# Patient Record
Sex: Male | Born: 1970
Health system: Southern US, Community
[De-identification: ages and names within clinical notes are randomized; demographics above are authoritative.]

## PROBLEM LIST (undated history)

## (undated) DIAGNOSIS — I509 Heart failure, unspecified: Secondary | ICD-10-CM

## (undated) DIAGNOSIS — E119 Type 2 diabetes mellitus without complications: Secondary | ICD-10-CM

## (undated) DIAGNOSIS — I201 Angina pectoris with documented spasm: Secondary | ICD-10-CM

## (undated) DIAGNOSIS — I2510 Atherosclerotic heart disease of native coronary artery without angina pectoris: Secondary | ICD-10-CM

## (undated) DIAGNOSIS — G4739 Other sleep apnea: Secondary | ICD-10-CM

## (undated) DIAGNOSIS — E785 Hyperlipidemia, unspecified: Secondary | ICD-10-CM

## (undated) DIAGNOSIS — L84 Corns and callosities: Secondary | ICD-10-CM

## (undated) DIAGNOSIS — I10 Essential (primary) hypertension: Secondary | ICD-10-CM

## (undated) DIAGNOSIS — K859 Acute pancreatitis without necrosis or infection, unspecified: Secondary | ICD-10-CM

## (undated) HISTORY — DX: Atherosclerotic heart disease of native coronary artery without angina pectoris: I25.10

## (undated) HISTORY — DX: Acute pancreatitis without necrosis or infection, unspecified: K85.90

## (undated) HISTORY — DX: Type 2 diabetes mellitus without complications: E11.9

## (undated) HISTORY — DX: Angina pectoris with documented spasm: I20.1

## (undated) HISTORY — DX: Hyperlipidemia, unspecified: E78.5

## (undated) HISTORY — DX: Heart failure, unspecified: I50.9

## (undated) HISTORY — DX: Corns and callosities: L84

## (undated) HISTORY — DX: Other sleep apnea: G47.39

## (undated) HISTORY — DX: Essential (primary) hypertension: I10

---

## 2003-06-24 ENCOUNTER — Ambulatory Visit: Admission: RE | Admit: 2003-06-24 | Discharge: 2003-06-24 | Payer: Self-pay | Admitting: Unknown Physician Specialty

## 2014-01-17 HISTORY — PX: APPENDECTOMY: SHX54

## 2014-01-17 HISTORY — PX: OTHER SURGICAL HISTORY: SHX169

## 2014-07-14 DIAGNOSIS — G8929 Other chronic pain: Secondary | ICD-10-CM | POA: Diagnosis not present

## 2014-07-14 DIAGNOSIS — G4733 Obstructive sleep apnea (adult) (pediatric): Secondary | ICD-10-CM | POA: Diagnosis not present

## 2014-07-14 DIAGNOSIS — I10 Essential (primary) hypertension: Secondary | ICD-10-CM | POA: Diagnosis not present

## 2014-07-14 DIAGNOSIS — K37 Unspecified appendicitis: Secondary | ICD-10-CM | POA: Diagnosis not present

## 2014-07-14 DIAGNOSIS — F17200 Nicotine dependence, unspecified, uncomplicated: Secondary | ICD-10-CM | POA: Diagnosis not present

## 2014-07-14 DIAGNOSIS — M549 Dorsalgia, unspecified: Secondary | ICD-10-CM | POA: Diagnosis not present

## 2014-07-14 DIAGNOSIS — K3580 Unspecified acute appendicitis: Secondary | ICD-10-CM | POA: Diagnosis not present

## 2014-07-14 DIAGNOSIS — Z79899 Other long term (current) drug therapy: Secondary | ICD-10-CM | POA: Diagnosis not present

## 2014-07-14 DIAGNOSIS — M1990 Unspecified osteoarthritis, unspecified site: Secondary | ICD-10-CM | POA: Diagnosis not present

## 2014-07-14 DIAGNOSIS — R1030 Lower abdominal pain, unspecified: Secondary | ICD-10-CM | POA: Diagnosis not present

## 2014-07-14 DIAGNOSIS — E119 Type 2 diabetes mellitus without complications: Secondary | ICD-10-CM | POA: Diagnosis not present

## 2014-07-14 DIAGNOSIS — K3589 Other acute appendicitis: Secondary | ICD-10-CM | POA: Diagnosis not present

## 2014-07-15 DIAGNOSIS — K37 Unspecified appendicitis: Secondary | ICD-10-CM | POA: Diagnosis not present

## 2014-07-16 DIAGNOSIS — K37 Unspecified appendicitis: Secondary | ICD-10-CM | POA: Diagnosis not present

## 2014-08-09 DIAGNOSIS — K859 Acute pancreatitis, unspecified: Secondary | ICD-10-CM | POA: Diagnosis not present

## 2014-08-09 DIAGNOSIS — K920 Hematemesis: Secondary | ICD-10-CM | POA: Diagnosis not present

## 2014-08-09 DIAGNOSIS — K449 Diaphragmatic hernia without obstruction or gangrene: Secondary | ICD-10-CM | POA: Diagnosis not present

## 2014-08-09 DIAGNOSIS — K219 Gastro-esophageal reflux disease without esophagitis: Secondary | ICD-10-CM | POA: Diagnosis not present

## 2014-08-09 DIAGNOSIS — E119 Type 2 diabetes mellitus without complications: Secondary | ICD-10-CM | POA: Diagnosis not present

## 2014-08-09 DIAGNOSIS — R0602 Shortness of breath: Secondary | ICD-10-CM | POA: Diagnosis not present

## 2014-08-09 DIAGNOSIS — Z6834 Body mass index (BMI) 34.0-34.9, adult: Secondary | ICD-10-CM | POA: Diagnosis not present

## 2014-08-09 DIAGNOSIS — E781 Pure hyperglyceridemia: Secondary | ICD-10-CM | POA: Diagnosis not present

## 2014-08-09 DIAGNOSIS — M545 Low back pain: Secondary | ICD-10-CM | POA: Diagnosis not present

## 2014-08-09 DIAGNOSIS — I10 Essential (primary) hypertension: Secondary | ICD-10-CM | POA: Diagnosis not present

## 2014-08-09 DIAGNOSIS — R1110 Vomiting, unspecified: Secondary | ICD-10-CM | POA: Diagnosis not present

## 2014-08-10 DIAGNOSIS — K859 Acute pancreatitis, unspecified: Secondary | ICD-10-CM | POA: Diagnosis not present

## 2014-08-11 DIAGNOSIS — K859 Acute pancreatitis, unspecified: Secondary | ICD-10-CM | POA: Diagnosis not present

## 2014-09-15 DIAGNOSIS — I10 Essential (primary) hypertension: Secondary | ICD-10-CM | POA: Diagnosis not present

## 2014-09-15 DIAGNOSIS — E1140 Type 2 diabetes mellitus with diabetic neuropathy, unspecified: Secondary | ICD-10-CM | POA: Diagnosis not present

## 2014-09-15 DIAGNOSIS — G4730 Sleep apnea, unspecified: Secondary | ICD-10-CM | POA: Diagnosis not present

## 2014-09-30 DIAGNOSIS — G4730 Sleep apnea, unspecified: Secondary | ICD-10-CM | POA: Diagnosis not present

## 2014-10-02 DIAGNOSIS — G4730 Sleep apnea, unspecified: Secondary | ICD-10-CM | POA: Diagnosis not present

## 2014-10-03 DIAGNOSIS — R109 Unspecified abdominal pain: Secondary | ICD-10-CM | POA: Diagnosis not present

## 2014-12-29 DIAGNOSIS — E1143 Type 2 diabetes mellitus with diabetic autonomic (poly)neuropathy: Secondary | ICD-10-CM | POA: Diagnosis not present

## 2014-12-29 DIAGNOSIS — I10 Essential (primary) hypertension: Secondary | ICD-10-CM | POA: Diagnosis not present

## 2015-01-29 DIAGNOSIS — E119 Type 2 diabetes mellitus without complications: Secondary | ICD-10-CM | POA: Diagnosis not present

## 2015-04-02 DIAGNOSIS — I10 Essential (primary) hypertension: Secondary | ICD-10-CM | POA: Diagnosis not present

## 2015-04-02 DIAGNOSIS — E1143 Type 2 diabetes mellitus with diabetic autonomic (poly)neuropathy: Secondary | ICD-10-CM | POA: Diagnosis not present

## 2015-06-23 DIAGNOSIS — K0889 Other specified disorders of teeth and supporting structures: Secondary | ICD-10-CM | POA: Diagnosis not present

## 2015-06-23 DIAGNOSIS — E119 Type 2 diabetes mellitus without complications: Secondary | ICD-10-CM | POA: Diagnosis not present

## 2015-06-23 DIAGNOSIS — Z79899 Other long term (current) drug therapy: Secondary | ICD-10-CM | POA: Diagnosis not present

## 2015-06-23 DIAGNOSIS — Z7984 Long term (current) use of oral hypoglycemic drugs: Secondary | ICD-10-CM | POA: Diagnosis not present

## 2015-06-23 DIAGNOSIS — K047 Periapical abscess without sinus: Secondary | ICD-10-CM | POA: Diagnosis not present

## 2015-06-23 DIAGNOSIS — F17200 Nicotine dependence, unspecified, uncomplicated: Secondary | ICD-10-CM | POA: Diagnosis not present

## 2015-06-23 DIAGNOSIS — I10 Essential (primary) hypertension: Secondary | ICD-10-CM | POA: Diagnosis not present

## 2015-07-03 DIAGNOSIS — E1143 Type 2 diabetes mellitus with diabetic autonomic (poly)neuropathy: Secondary | ICD-10-CM | POA: Diagnosis not present

## 2015-07-03 DIAGNOSIS — I10 Essential (primary) hypertension: Secondary | ICD-10-CM | POA: Diagnosis not present

## 2015-07-03 DIAGNOSIS — G4739 Other sleep apnea: Secondary | ICD-10-CM | POA: Diagnosis not present

## 2015-07-22 DIAGNOSIS — G4739 Other sleep apnea: Secondary | ICD-10-CM | POA: Diagnosis not present

## 2015-07-22 DIAGNOSIS — E1143 Type 2 diabetes mellitus with diabetic autonomic (poly)neuropathy: Secondary | ICD-10-CM | POA: Diagnosis not present

## 2015-07-22 DIAGNOSIS — E784 Other hyperlipidemia: Secondary | ICD-10-CM | POA: Diagnosis not present

## 2015-07-22 DIAGNOSIS — I10 Essential (primary) hypertension: Secondary | ICD-10-CM | POA: Diagnosis not present

## 2015-10-02 DIAGNOSIS — G4739 Other sleep apnea: Secondary | ICD-10-CM | POA: Diagnosis not present

## 2015-10-02 DIAGNOSIS — I10 Essential (primary) hypertension: Secondary | ICD-10-CM | POA: Diagnosis not present

## 2015-10-02 DIAGNOSIS — E1143 Type 2 diabetes mellitus with diabetic autonomic (poly)neuropathy: Secondary | ICD-10-CM | POA: Diagnosis not present

## 2015-10-22 DIAGNOSIS — G4739 Other sleep apnea: Secondary | ICD-10-CM | POA: Diagnosis not present

## 2015-10-22 DIAGNOSIS — I10 Essential (primary) hypertension: Secondary | ICD-10-CM | POA: Diagnosis not present

## 2015-10-22 DIAGNOSIS — E1143 Type 2 diabetes mellitus with diabetic autonomic (poly)neuropathy: Secondary | ICD-10-CM | POA: Diagnosis not present

## 2015-10-22 DIAGNOSIS — E784 Other hyperlipidemia: Secondary | ICD-10-CM | POA: Diagnosis not present

## 2015-11-18 DIAGNOSIS — I10 Essential (primary) hypertension: Secondary | ICD-10-CM | POA: Diagnosis not present

## 2015-11-18 DIAGNOSIS — G4739 Other sleep apnea: Secondary | ICD-10-CM | POA: Diagnosis not present

## 2015-11-18 DIAGNOSIS — E1143 Type 2 diabetes mellitus with diabetic autonomic (poly)neuropathy: Secondary | ICD-10-CM | POA: Diagnosis not present

## 2015-11-18 DIAGNOSIS — E784 Other hyperlipidemia: Secondary | ICD-10-CM | POA: Diagnosis not present

## 2015-12-21 DIAGNOSIS — G4739 Other sleep apnea: Secondary | ICD-10-CM | POA: Diagnosis not present

## 2015-12-21 DIAGNOSIS — E1143 Type 2 diabetes mellitus with diabetic autonomic (poly)neuropathy: Secondary | ICD-10-CM | POA: Diagnosis not present

## 2015-12-21 DIAGNOSIS — E784 Other hyperlipidemia: Secondary | ICD-10-CM | POA: Diagnosis not present

## 2015-12-21 DIAGNOSIS — I10 Essential (primary) hypertension: Secondary | ICD-10-CM | POA: Diagnosis not present

## 2016-01-01 DIAGNOSIS — E1143 Type 2 diabetes mellitus with diabetic autonomic (poly)neuropathy: Secondary | ICD-10-CM | POA: Diagnosis not present

## 2016-01-01 DIAGNOSIS — Z0000 Encounter for general adult medical examination without abnormal findings: Secondary | ICD-10-CM | POA: Diagnosis not present

## 2016-01-01 DIAGNOSIS — L303 Infective dermatitis: Secondary | ICD-10-CM | POA: Diagnosis not present

## 2016-01-01 DIAGNOSIS — Z23 Encounter for immunization: Secondary | ICD-10-CM | POA: Diagnosis not present

## 2016-01-01 DIAGNOSIS — I10 Essential (primary) hypertension: Secondary | ICD-10-CM | POA: Diagnosis not present

## 2016-01-16 DIAGNOSIS — G8929 Other chronic pain: Secondary | ICD-10-CM | POA: Diagnosis not present

## 2016-01-16 DIAGNOSIS — M545 Low back pain: Secondary | ICD-10-CM | POA: Diagnosis not present

## 2016-01-16 DIAGNOSIS — Z79899 Other long term (current) drug therapy: Secondary | ICD-10-CM | POA: Diagnosis not present

## 2016-01-16 DIAGNOSIS — Z7984 Long term (current) use of oral hypoglycemic drugs: Secondary | ICD-10-CM | POA: Diagnosis not present

## 2016-01-16 DIAGNOSIS — F17200 Nicotine dependence, unspecified, uncomplicated: Secondary | ICD-10-CM | POA: Diagnosis not present

## 2016-01-16 DIAGNOSIS — I10 Essential (primary) hypertension: Secondary | ICD-10-CM | POA: Diagnosis not present

## 2016-01-16 DIAGNOSIS — E119 Type 2 diabetes mellitus without complications: Secondary | ICD-10-CM | POA: Diagnosis not present

## 2016-01-16 DIAGNOSIS — Z87442 Personal history of urinary calculi: Secondary | ICD-10-CM | POA: Diagnosis not present

## 2016-01-16 DIAGNOSIS — N390 Urinary tract infection, site not specified: Secondary | ICD-10-CM | POA: Diagnosis not present

## 2016-01-20 DIAGNOSIS — E1143 Type 2 diabetes mellitus with diabetic autonomic (poly)neuropathy: Secondary | ICD-10-CM | POA: Diagnosis not present

## 2016-01-20 DIAGNOSIS — G4739 Other sleep apnea: Secondary | ICD-10-CM | POA: Diagnosis not present

## 2016-01-20 DIAGNOSIS — E784 Other hyperlipidemia: Secondary | ICD-10-CM | POA: Diagnosis not present

## 2016-01-20 DIAGNOSIS — I10 Essential (primary) hypertension: Secondary | ICD-10-CM | POA: Diagnosis not present

## 2016-01-29 DIAGNOSIS — N200 Calculus of kidney: Secondary | ICD-10-CM | POA: Diagnosis not present

## 2016-01-30 DIAGNOSIS — Z87442 Personal history of urinary calculi: Secondary | ICD-10-CM | POA: Diagnosis not present

## 2016-01-30 DIAGNOSIS — R319 Hematuria, unspecified: Secondary | ICD-10-CM | POA: Diagnosis not present

## 2016-01-30 DIAGNOSIS — R1012 Left upper quadrant pain: Secondary | ICD-10-CM | POA: Diagnosis not present

## 2016-01-30 DIAGNOSIS — R1032 Left lower quadrant pain: Secondary | ICD-10-CM | POA: Diagnosis not present

## 2017-02-21 DIAGNOSIS — E1143 Type 2 diabetes mellitus with diabetic autonomic (poly)neuropathy: Secondary | ICD-10-CM | POA: Diagnosis not present

## 2017-02-21 DIAGNOSIS — G4739 Other sleep apnea: Secondary | ICD-10-CM | POA: Diagnosis not present

## 2017-02-21 DIAGNOSIS — I10 Essential (primary) hypertension: Secondary | ICD-10-CM | POA: Diagnosis not present

## 2017-02-21 DIAGNOSIS — E7849 Other hyperlipidemia: Secondary | ICD-10-CM | POA: Diagnosis not present

## 2017-02-24 DIAGNOSIS — E7849 Other hyperlipidemia: Secondary | ICD-10-CM | POA: Diagnosis not present

## 2017-02-24 DIAGNOSIS — E1143 Type 2 diabetes mellitus with diabetic autonomic (poly)neuropathy: Secondary | ICD-10-CM | POA: Diagnosis not present

## 2017-02-24 DIAGNOSIS — G4739 Other sleep apnea: Secondary | ICD-10-CM | POA: Diagnosis not present

## 2017-02-24 DIAGNOSIS — I10 Essential (primary) hypertension: Secondary | ICD-10-CM | POA: Diagnosis not present

## 2017-05-25 DIAGNOSIS — L84 Corns and callosities: Secondary | ICD-10-CM | POA: Diagnosis not present

## 2017-05-25 DIAGNOSIS — G4739 Other sleep apnea: Secondary | ICD-10-CM | POA: Diagnosis not present

## 2017-05-25 DIAGNOSIS — I201 Angina pectoris with documented spasm: Secondary | ICD-10-CM | POA: Diagnosis not present

## 2017-05-25 DIAGNOSIS — E7849 Other hyperlipidemia: Secondary | ICD-10-CM | POA: Diagnosis not present

## 2017-05-25 DIAGNOSIS — I10 Essential (primary) hypertension: Secondary | ICD-10-CM | POA: Diagnosis not present

## 2017-05-25 DIAGNOSIS — E1143 Type 2 diabetes mellitus with diabetic autonomic (poly)neuropathy: Secondary | ICD-10-CM | POA: Diagnosis not present

## 2017-05-30 DIAGNOSIS — R931 Abnormal findings on diagnostic imaging of heart and coronary circulation: Secondary | ICD-10-CM | POA: Diagnosis not present

## 2017-05-30 DIAGNOSIS — I209 Angina pectoris, unspecified: Secondary | ICD-10-CM | POA: Diagnosis not present

## 2017-05-30 DIAGNOSIS — I201 Angina pectoris with documented spasm: Secondary | ICD-10-CM | POA: Diagnosis not present

## 2017-05-30 DIAGNOSIS — I517 Cardiomegaly: Secondary | ICD-10-CM | POA: Diagnosis not present

## 2017-06-19 ENCOUNTER — Encounter: Payer: Self-pay | Admitting: *Deleted

## 2017-06-20 ENCOUNTER — Telehealth: Payer: Self-pay | Admitting: Cardiology

## 2017-06-20 ENCOUNTER — Ambulatory Visit (INDEPENDENT_AMBULATORY_CARE_PROVIDER_SITE_OTHER): Payer: Medicare Other | Admitting: Cardiology

## 2017-06-20 ENCOUNTER — Encounter: Payer: Self-pay | Admitting: Cardiology

## 2017-06-20 VITALS — BP 164/93 | HR 79 | Ht 73.0 in | Wt 302.0 lb

## 2017-06-20 DIAGNOSIS — R0609 Other forms of dyspnea: Secondary | ICD-10-CM

## 2017-06-20 DIAGNOSIS — R0600 Dyspnea, unspecified: Secondary | ICD-10-CM

## 2017-06-20 DIAGNOSIS — R9439 Abnormal result of other cardiovascular function study: Secondary | ICD-10-CM

## 2017-06-20 NOTE — Telephone Encounter (Signed)
Pre-cert Verification for the following procedure   Echo scheduled for 06/28/2017

## 2017-06-20 NOTE — Patient Instructions (Signed)
Your physician recommends that you schedule a follow-up appointment in: 6 WEEKS WITH DR BRANCH  Your physician recommends that you continue on your current medications as directed. Please refer to the Current Medication list given to you today.  Your physician has requested that you have an echocardiogram. Echocardiography is a painless test that uses sound waves to create images of your heart. It provides your doctor with information about the size and shape of your heart and how well your heart's chambers and valves are working. This procedure takes approximately one hour. There are no restrictions for this procedure.  Thank you for choosing [facility]!!   

## 2017-06-20 NOTE — Progress Notes (Signed)
Clinical Summary [name] is a 47 y.o.male seen as new consult, referred by Dr Horris LatinoHasanaji for DOE and abnormal nuclear stress test.   1. Chest pain/SOB - chronic SOB. Limited by chronic joint pains. DOE at around 2 blocks. - can have some occasional LE edema. No chest pain. No orthopnea - 05/2017 nuclear stress: Large fixed defects anterior, lateral, inferior myocardium. NO reversibility. LVEF 17%.   CAD risk factors: DM2, HTN, HL, former smoker x 30 years, father with fatal MI 7436  2. HTN - compliant with meds - bp with pcp few weeks 120/80   Past Medical History:  Diagnosis Date  . Angina pectoris with documented spasm (HCC)   . Corns and callosities   . Diabetes mellitus without complication (HCC)   . Hyperlipidemia   . Hypertension   . Other sleep apnea   . Pancreatitis      Allergies not on file   Current Outpatient Medications  Medication Sig Dispense Refill  . FLUoxetine (PROZAC) 20 MG tablet Take 20 mg by mouth daily.    Marland Kitchen. gabapentin (NEURONTIN) 300 MG capsule Take 300 mg by mouth 4 (four) times daily.    . insulin glargine (LANTUS) 100 UNIT/ML injection Inject 100 Units into the skin at bedtime. 20 units    . lisinopril-hydrochlorothiazide (PRINZIDE,ZESTORETIC) 20-25 MG tablet Take 1 tablet by mouth daily.    . metFORMIN (GLUCOPHAGE) 1000 MG tablet Take 1,000 mg by mouth 2 (two) times daily with a meal.    . naproxen (NAPROSYN) 500 MG tablet Take 500 mg by mouth 2 (two) times daily with a meal.    . Omega-3 Fatty Acids (FISH OIL) 1000 MG CAPS Take 1,000 mg by mouth 3 (three) times daily.    . simvastatin (ZOCOR) 20 MG tablet Take 20 mg by mouth daily.     No current facility-administered medications for this visit.         Allergies NKDA    Family History  Problem Relation Age of Onset  . Aneurysm Father   Father with MI in his 7530s   Social History [name] reports that he has been smoking cigarettes.  He does not have any smokeless  tobacco history on file. [name] has no alcohol history on file.   Review of Systems CONSTITUTIONAL: No weight loss, fever, chills, weakness or fatigue.  HEENT: Eyes: No visual loss, blurred vision, double vision or yellow sclerae.No hearing loss, sneezing, congestion, runny nose or sore throat.  SKIN: No rash or itching.  CARDIOVASCULAR: per hpi RESPIRATORY: per hpi GASTROINTESTINAL: No anorexia, nausea, vomiting or diarrhea. No abdominal pain or blood.  GENITOURINARY: No burning on urination, no polyuria NEUROLOGICAL: No headache, dizziness, syncope, paralysis, ataxia, numbness or tingling in the extremities. No change in bowel or bladder control.  MUSCULOSKELETAL: No muscle, back pain, joint pain or stiffness.  LYMPHATICS: No enlarged nodes. No history of splenectomy.  PSYCHIATRIC: No history of depression or anxiety.  ENDOCRINOLOGIC: No reports of sweating, cold or heat intolerance. No polyuria or polydipsia.  Marland Kitchen.   Physical Examination Vitals:   06/20/17 0842  BP: (!) 164/93  Pulse: 79  SpO2: 97%   Vitals:   06/20/17 0842  Weight: (!) 302 lb (137 kg)  Height: 6\' 1"  (1.854 m)    Gen: resting comfortably, no acute distress HEENT: no scleral icterus, pupils equal round and reactive, no palptable cervical adenopathy,  CV Resp: Clear to auscultation bilaterally GI: abdomen is soft, non-tender, non-distended, normal bowel sounds,  no hepatosplenomegaly MSK: extremities are warm, no edema.  Skin: warm, no rash Neuro:  no focal deficits Psych: appropriate affect      Assessment and Plan  1. DOE/Abnormal Stress test - stress test by pcp with multiple large fixed defects, LVEF 17%. We will obtain echo to further evaluate. If confirmed LV systolic dysfunction will require medical therapy and cath. Multiple CAD risk factors and strong family history.  Would start coreg pending echo.  - EKG today SR, no acute ischemic changes  2. HTN - above goal today, at goal during  his recent pcp visit - continue current meds. If confirmed LV systolic dysfunction would start coreg   F/u 6 weeks.     [name], M.D.

## 2017-06-28 ENCOUNTER — Other Ambulatory Visit: Payer: Self-pay

## 2017-06-28 ENCOUNTER — Ambulatory Visit (INDEPENDENT_AMBULATORY_CARE_PROVIDER_SITE_OTHER): Payer: Medicare Other

## 2017-06-28 DIAGNOSIS — R0609 Other forms of dyspnea: Secondary | ICD-10-CM | POA: Diagnosis not present

## 2017-06-28 DIAGNOSIS — R0600 Dyspnea, unspecified: Secondary | ICD-10-CM

## 2017-06-29 ENCOUNTER — Telehealth: Payer: Self-pay | Admitting: *Deleted

## 2017-06-29 DIAGNOSIS — R931 Abnormal findings on diagnostic imaging of heart and coronary circulation: Secondary | ICD-10-CM

## 2017-06-29 NOTE — Telephone Encounter (Signed)
Pt aware and agreeable to limited echo - will place orders and forward to schedulers (no charge)

## 2017-06-29 NOTE — Telephone Encounter (Signed)
-----   Message from Antoine PocheJonathan F Branch, MD sent at 06/29/2017  1:52 PM EDT ----- Echo does suggest some possible weakness to his heart, but he needs a limited [facility] contrast at [facility] to really clarify the degree of this. Can we arrange one no charge to patient limited echo with contrast to evaluate LV function   Dominga FerryJ Branch MD

## 2017-07-13 ENCOUNTER — Telehealth: Payer: Self-pay | Admitting: Cardiology

## 2017-07-13 NOTE — Telephone Encounter (Signed)
Spoke with schedulers 2 weeks ago regarding doing this limited echo free of charge per Dr Wyline MoodBranch an email was sent to practice admin - will forward to schedulers on how to proceed with scheduling - orders were already placed for this

## 2017-07-13 NOTE — Telephone Encounter (Signed)
Patient called stating that he was waiting on a call about upcoming test.

## 2017-07-14 ENCOUNTER — Other Ambulatory Visit: Payer: Self-pay | Admitting: *Deleted

## 2017-07-14 DIAGNOSIS — R931 Abnormal findings on diagnostic imaging of heart and coronary circulation: Secondary | ICD-10-CM

## 2017-07-24 ENCOUNTER — Ambulatory Visit (HOSPITAL_COMMUNITY)
Admission: RE | Admit: 2017-07-24 | Discharge: 2017-07-24 | Disposition: A | Discharge status: Home / Self Care | Payer: Medicare Other | Source: Ambulatory Visit | Attending: Cardiology | Admitting: Cardiology

## 2017-07-24 DIAGNOSIS — Z6839 Body mass index (BMI) 39.0-39.9, adult: Secondary | ICD-10-CM | POA: Diagnosis not present

## 2017-07-24 DIAGNOSIS — R931 Abnormal findings on diagnostic imaging of heart and coronary circulation: Secondary | ICD-10-CM | POA: Diagnosis present

## 2017-07-24 DIAGNOSIS — I340 Nonrheumatic mitral (valve) insufficiency: Secondary | ICD-10-CM | POA: Insufficient documentation

## 2017-07-24 DIAGNOSIS — I517 Cardiomegaly: Secondary | ICD-10-CM | POA: Diagnosis not present

## 2017-07-24 DIAGNOSIS — F17210 Nicotine dependence, cigarettes, uncomplicated: Secondary | ICD-10-CM | POA: Insufficient documentation

## 2017-07-24 DIAGNOSIS — E785 Hyperlipidemia, unspecified: Secondary | ICD-10-CM | POA: Insufficient documentation

## 2017-07-24 DIAGNOSIS — E119 Type 2 diabetes mellitus without complications: Secondary | ICD-10-CM | POA: Diagnosis not present

## 2017-07-24 DIAGNOSIS — I119 Hypertensive heart disease without heart failure: Secondary | ICD-10-CM | POA: Insufficient documentation

## 2017-07-24 MED ORDER — PERFLUTREN LIPID MICROSPHERE
1.0000 mL | INTRAVENOUS | Status: AC | PRN
  Administered 2017-07-24: 2 mL via INTRAVENOUS
  Administered 2017-07-24: 1 mL via INTRAVENOUS
  Filled 2017-07-24: qty 10

## 2017-07-24 NOTE — Progress Notes (Signed)
*  PRELIMINARY RESULTS* Echocardiogram Limited 2-D Echocardiogram has been performed with Definity.  Stacey DrainWhite, Avie Checo J 07/24/2017, 11:28 AM

## 2017-07-31 ENCOUNTER — Telehealth: Payer: Self-pay | Admitting: *Deleted

## 2017-07-31 NOTE — Telephone Encounter (Signed)
-----   Message from Antoine PocheJonathan F Branch, MD sent at 07/31/2017 12:50 PM EDT ----- Echo does show some weakness in his heart function, we need to discuss some medications and possible additional testing. [place] at 2pm?   Dominga FerryJ Branch MD

## 2017-07-31 NOTE — Telephone Encounter (Signed)
Sharlet SalinaCathy Odell informed and verbalized understanding of plan.

## 2017-08-02 ENCOUNTER — Ambulatory Visit: Payer: Medicare Other | Admitting: Cardiology

## 2017-08-02 ENCOUNTER — Ambulatory Visit (INDEPENDENT_AMBULATORY_CARE_PROVIDER_SITE_OTHER): Payer: Medicare Other | Admitting: Cardiology

## 2017-08-02 ENCOUNTER — Encounter: Payer: Self-pay | Admitting: Cardiology

## 2017-08-02 ENCOUNTER — Other Ambulatory Visit (HOSPITAL_COMMUNITY)
Admission: RE | Admit: 2017-08-02 | Discharge: 2017-08-02 | Disposition: A | Discharge status: Home / Self Care | Payer: Medicare Other | Source: Ambulatory Visit | Attending: Cardiology | Admitting: Cardiology

## 2017-08-02 ENCOUNTER — Ambulatory Visit (HOSPITAL_COMMUNITY)
Admission: RE | Admit: 2017-08-02 | Discharge: 2017-08-02 | Disposition: A | Discharge status: Home / Self Care | Payer: Medicare Other | Source: Ambulatory Visit | Attending: Cardiology | Admitting: Cardiology

## 2017-08-02 VITALS — BP 142/88 | HR 92 | Ht 73.0 in | Wt 301.0 lb

## 2017-08-02 DIAGNOSIS — R0602 Shortness of breath: Secondary | ICD-10-CM | POA: Diagnosis not present

## 2017-08-02 DIAGNOSIS — Z01818 Encounter for other preprocedural examination: Secondary | ICD-10-CM | POA: Insufficient documentation

## 2017-08-02 DIAGNOSIS — J9811 Atelectasis: Secondary | ICD-10-CM | POA: Insufficient documentation

## 2017-08-02 DIAGNOSIS — I5022 Chronic systolic (congestive) heart failure: Secondary | ICD-10-CM | POA: Diagnosis not present

## 2017-08-02 LAB — BASIC METABOLIC PANEL
Anion gap: 11 (ref 5–15)
BUN: 27 mg/dL — ABNORMAL HIGH (ref 6–20)
CO2: 25 mmol/L (ref 22–32)
Calcium: 9.2 mg/dL (ref 8.9–10.3)
Chloride: 101 mmol/L (ref 98–111)
Creatinine, Ser: 0.84 mg/dL (ref 0.61–1.24)
GFR calc Af Amer: >60 mL/min (ref >60)
GFR calc non Af Amer: >60 mL/min (ref >60)
Glucose, Bld: 182 mg/dL — ABNORMAL HIGH (ref 70–99)
Potassium: 4.1 mmol/L (ref 3.5–5.1)
Sodium: 137 mmol/L (ref 135–145)

## 2017-08-02 LAB — CBC
HCT: 41.8 % (ref 39.0–52.0)
Hemoglobin: 14.1 g/dL (ref 13.0–17.0)
MCH: 30.3 pg (ref 26.0–34.0)
MCHC: 33.7 g/dL (ref 30.0–36.0)
MCV: 89.7 fL (ref 78.0–100.0)
Platelets: 156 10*3/uL (ref 150–400)
RBC: 4.66 MIL/uL (ref 4.22–5.81)
RDW: 12.8 % (ref 11.5–15.5)
WBC: 7.7 10*3/uL (ref 4.0–10.5)

## 2017-08-02 LAB — TSH: TSH: 1.018 u[IU]/mL (ref 0.350–4.500)

## 2017-08-02 MED ORDER — METOPROLOL SUCCINATE ER 25 MG PO TB24: 25.0000 mg | ORAL_TABLET | Freq: Every day | ORAL | 3 refills | Status: DC

## 2017-08-02 MED ORDER — LISINOPRIL 20 MG PO TABS: 20.0000 mg | ORAL_TABLET | Freq: Every day | ORAL | 3 refills | Status: DC

## 2017-08-02 NOTE — H&P (View-Only) (Signed)
Clinical Summary [name] is a 47 y.o.male seen today for follow up of the following medical problems.   1. Chest pain/SOB - chronic SOB. Limited by chronic joint pains. DOE at around 2 blocks. - can have some occasional LE edema. No chest pain. No orthopnea - 05/2017 nuclear stress: Large fixed defects anterior, lateral, inferior myocardium. NO reversibility. LVEF 17%.  - 07/2017 echo LVEF 30-35%  CAD risk factors: DM2, HTN, HL, former smoker x 30 years, father with fatal MI 3436 - symptoms unchanged since last visit.    2. HTN - compliant with meds - bp with pcp few weeks 120/80  3. Orthostatic dizziness - DBP 96 to 80 with standing.  - mild symptoms at times.  Past Medical History:  Diagnosis Date  . Angina pectoris with documented spasm (HCC)   . Corns and callosities   . Diabetes mellitus without complication (HCC)   . Hyperlipidemia   . Hypertension   . Other sleep apnea   . Pancreatitis      No Known Allergies   Current Outpatient Medications  Medication Sig Dispense Refill  . FLUoxetine (PROZAC) 20 MG tablet Take 20 mg by mouth daily.    Marland Kitchen. gabapentin (NEURONTIN) 300 MG capsule Take 300 mg by mouth 4 (four) times daily.    . insulin glargine (LANTUS) 100 UNIT/ML injection Inject 20 Units into the skin at bedtime.     Marland Kitchen. lisinopril-hydrochlorothiazide (PRINZIDE,ZESTORETIC) 20-25 MG tablet Take 1 tablet by mouth daily.    . metFORMIN (GLUCOPHAGE) 1000 MG tablet Take 1,000 mg by mouth 2 (two) times daily with a meal.    . naproxen (NAPROSYN) 500 MG tablet Take 500 mg by mouth 2 (two) times daily with a meal.    . simvastatin (ZOCOR) 20 MG tablet Take 20 mg by mouth daily.     No current facility-administered medications for this visit.      Past Surgical History:  Procedure Laterality Date  . APPENDECTOMY  2016  . OTHER SURGICAL HISTORY  2016     No Known Allergies    Family History  Problem Relation Age of Onset  . Aneurysm Father       Social History [name] reports that he has been smoking e-cigarettes.  He has never used smokeless tobacco. Mr. [name] has no alcohol history on file.   Review of Systems CONSTITUTIONAL: No weight loss, fever, chills, weakness or fatigue.  HEENT: Eyes: No visual loss, blurred vision, double vision or yellow sclerae.No hearing loss, sneezing, congestion, runny nose or sore throat.  SKIN: No rash or itching.  CARDIOVASCULAR: per hpi RESPIRATORY: per hpi GASTROINTESTINAL: No anorexia, nausea, vomiting or diarrhea. No abdominal pain or blood.  GENITOURINARY: No burning on urination, no polyuria NEUROLOGICAL: No headache, dizziness, syncope, paralysis, ataxia, numbness or tingling in the extremities. No change in bowel or bladder control.  MUSCULOSKELETAL: No muscle, back pain, joint pain or stiffness.  LYMPHATICS: No enlarged nodes. No history of splenectomy.  PSYCHIATRIC: No history of depression or anxiety.  ENDOCRINOLOGIC: No reports of sweating, cold or heat intolerance. No polyuria or polydipsia.  Marland Kitchen.   Physical Examination Vitals:   08/02/17 1352  BP: (!) 142/88  Pulse: 92  SpO2: 98%   Vitals:   08/02/17 1352  Weight: (!) 301 lb (136.5 kg)  Height: 6\' 1"  (1.854 m)    Gen: resting comfortably, no acute distress HEENT: no scleral icterus, pupils equal round and reactive, no palptable cervical adenopathy,  CV: RRR, no m/r/g, no jvd Resp: Clear to auscultation bilaterally GI: abdomen is soft, non-tender, non-distended, normal bowel sounds, no hepatosplenomegaly MSK: extremities are warm, no edema.  Skin: warm, no rash Neuro:  no focal deficits Psych: appropriate affect   Diagnostic Studies  07/2017 echo LVEF 30-35%, grade I diastolic dysfunction, multiple WMAs, mild MR, mild RV dysfunction     Assessment and Plan   1. Systolic HF - new diagnosis by echo LVEF 30-35%, prior nuclear stress test suggests likely ischemic etiology - plan for  LHC/RHC - start toprol 25, lisinopril 20mg  daily  I have reviewed the risks, indications, and alternatives to cardiac catheterization, possible angioplasty, and stenting with the patient today. Risks include but are not limited to bleeding, infection, vascular injury, stroke, myocardial infection, arrhythmia, kidney injury, radiation-related injury in the case of prolonged fluoroscopy use, emergency cardiac surgery, and death. The patient understands the risks of serious complication is 1-2 in 1000 with diagnostic cardiac cath and 1-2% or less with angioplasty/stenting.      [name], M.D.

## 2017-08-02 NOTE — Patient Instructions (Signed)
Medication Instructions:  Stop prinzide  Start toprol xl 25 mg daily Start lisinopril 20 mg daily   Labwork: Today   Testing/Procedures: A chest x-ray takes a picture of the organs and structures inside the chest, including the heart, lungs, and blood vessels. This test can show several things, including, whether the heart is enlarges; whether fluid is building up in the lungs; and whether pacemaker / defibrillator leads are still in place.  Your physician has requested that you have a cardiac catheterization. Cardiac catheterization is used to diagnose and/or treat various heart conditions. Doctors may recommend this procedure for a number of different reasons. The most common reason is to evaluate chest pain. Chest pain can be a symptom of coronary artery disease (CAD), and cardiac catheterization can show whether plaque is narrowing or blocking your heart's arteries. This procedure is also used to evaluate the valves, as well as measure the blood flow and oxygen levels in different parts of your heart. For further information please visit www.[email or web address].org. Please follow instruction sheet, as given.    Follow-Up: Your physician recommends that you schedule a follow-up appointment in: 3 weeks     Any Other Special Instructions Will Be Listed Below (If Applicable).    [place] [email or web address] [place] [address] [place] [address] Dept: [phone] Loc: [phone]  Belenda CruiseRandolph Reynolds III  08/02/2017  You are scheduled for a Cardiac Catheterization on Tuesday, July 23 with Dr. Lance MussJayadeep Varanasi.  1. [facility] at [facility]: [address] [place], [place] [address] at 8:30 AM (two hours before your procedure to ensure your preparation). Free valet parking service is available.   Special note: Every effort is made to have your procedure done on time. Please understand that  emergencies sometimes delay scheduled procedures.  2. Diet: Do not eat or drink anything after midnight prior to your procedure except sips of water to take medications.  3. Labs:tTODAY       CHEST X-RAY   4. Medication instructions in preparation for your procedure:  DO NOT TAKE METFORMIN THE DAY OF PROCEDURE  TAKE 1/2 DOSE OF INSULIN THE NIGHT BEFORE PROCEDURE    On the morning of your procedure, take your Aspirin and any morning medicines NOT listed above.  You may use sips of water.  5. Plan for one night stay--bring personal belongings. 6. Bring a current list of your medications and current insurance cards. 7. You MUST have a responsible person to drive you home. 8. Someone MUST be with you the first 24 hours after you arrive home or your discharge will be delayed. 9. Please wear clothes that are easy to get on and off and wear slip-on shoes.  Thank you for allowing us to care for you!   -- [facility]      If you need a refill on your cardiac medications before your next appointment, please call your pharmacy.

## 2017-08-02 NOTE — Progress Notes (Signed)
   Clinical Summary Mr. [name] is a 47 y.o.male seen today for follow up of the following medical problems.   1. Chest pain/SOB - chronic SOB. Limited by chronic joint pains. DOE at around 2 blocks. - can have some occasional LE edema. No chest pain. No orthopnea - 05/2017 nuclear stress: Large fixed defects anterior, lateral, inferior myocardium. NO reversibility. LVEF 17%.  - 07/2017 echo LVEF 30-35%  CAD risk factors: DM2, HTN, HL, former smoker x 30 years, father with fatal MI 36 - symptoms unchanged since last visit.    2. HTN - compliant with meds - bp with pcp few weeks 120/80  3. Orthostatic dizziness - DBP 96 to 80 with standing.  - mild symptoms at times.  Past Medical History:  Diagnosis Date  . Angina pectoris with documented spasm (HCC)   . Corns and callosities   . Diabetes mellitus without complication (HCC)   . Hyperlipidemia   . Hypertension   . Other sleep apnea   . Pancreatitis      No Known Allergies   Current Outpatient Medications  Medication Sig Dispense Refill  . FLUoxetine (PROZAC) 20 MG tablet Take 20 mg by mouth daily.    . gabapentin (NEURONTIN) 300 MG capsule Take 300 mg by mouth 4 (four) times daily.    . insulin glargine (LANTUS) 100 UNIT/ML injection Inject 20 Units into the skin at bedtime.     . lisinopril-hydrochlorothiazide (PRINZIDE,ZESTORETIC) 20-25 MG tablet Take 1 tablet by mouth daily.    . metFORMIN (GLUCOPHAGE) 1000 MG tablet Take 1,000 mg by mouth 2 (two) times daily with a meal.    . naproxen (NAPROSYN) 500 MG tablet Take 500 mg by mouth 2 (two) times daily with a meal.    . simvastatin (ZOCOR) 20 MG tablet Take 20 mg by mouth daily.     No current facility-administered medications for this visit.      Past Surgical History:  Procedure Laterality Date  . APPENDECTOMY  2016  . OTHER SURGICAL HISTORY  2016     No Known Allergies    Family History  Problem Relation Age of Onset  . Aneurysm Father       Social History Mr. [name] reports that he has been smoking e-cigarettes.  He has never used smokeless tobacco. Mr. [name] has no alcohol history on file.   Review of Systems CONSTITUTIONAL: No weight loss, fever, chills, weakness or fatigue.  HEENT: Eyes: No visual loss, blurred vision, double vision or yellow sclerae.No hearing loss, sneezing, congestion, runny nose or sore throat.  SKIN: No rash or itching.  CARDIOVASCULAR: per hpi RESPIRATORY: per hpi GASTROINTESTINAL: No anorexia, nausea, vomiting or diarrhea. No abdominal pain or blood.  GENITOURINARY: No burning on urination, no polyuria NEUROLOGICAL: No headache, dizziness, syncope, paralysis, ataxia, numbness or tingling in the extremities. No change in bowel or bladder control.  MUSCULOSKELETAL: No muscle, back pain, joint pain or stiffness.  LYMPHATICS: No enlarged nodes. No history of splenectomy.  PSYCHIATRIC: No history of depression or anxiety.  ENDOCRINOLOGIC: No reports of sweating, cold or heat intolerance. No polyuria or polydipsia.  .   Physical Examination Vitals:   08/02/17 1352  BP: (!) 142/88  Pulse: 92  SpO2: 98%   Vitals:   08/02/17 1352  Weight: (!) 301 lb (136.5 kg)  Height: 6' 1" (1.854 m)    Gen: resting comfortably, no acute distress HEENT: no scleral icterus, pupils equal round and reactive, no palptable cervical adenopathy,    CV: RRR, no m/r/g, no jvd Resp: Clear to auscultation bilaterally GI: abdomen is soft, non-tender, non-distended, normal bowel sounds, no hepatosplenomegaly MSK: extremities are warm, no edema.  Skin: warm, no rash Neuro:  no focal deficits Psych: appropriate affect   Diagnostic Studies  07/2017 echo LVEF 30-35%, grade I diastolic dysfunction, multiple WMAs, mild MR, mild RV dysfunction     Assessment and Plan   1. Systolic HF - new diagnosis by echo LVEF 30-35%, prior nuclear stress test suggests likely ischemic etiology - plan for  LHC/RHC - start toprol 25, lisinopril 20mg daily  I have reviewed the risks, indications, and alternatives to cardiac catheterization, possible angioplasty, and stenting with the patient today. Risks include but are not limited to bleeding, infection, vascular injury, stroke, myocardial infection, arrhythmia, kidney injury, radiation-related injury in the case of prolonged fluoroscopy use, emergency cardiac surgery, and death. The patient understands the risks of serious complication is 1-2 in 1000 with diagnostic cardiac cath and 1-2% or less with angioplasty/stenting.      Tuyen Uncapher F. Glorious Flicker, M.D. 

## 2017-08-07 ENCOUNTER — Telehealth: Payer: Self-pay | Admitting: *Deleted

## 2017-08-07 ENCOUNTER — Other Ambulatory Visit: Payer: Self-pay | Admitting: Cardiology

## 2017-08-07 ENCOUNTER — Ambulatory Visit: Payer: Medicare Other | Admitting: Cardiology

## 2017-08-07 DIAGNOSIS — I5022 Chronic systolic (congestive) heart failure: Secondary | ICD-10-CM

## 2017-08-07 NOTE — Telephone Encounter (Addendum)
[facility] for: Tuesday August 08, 2017 10 :30 AM Verify arrival time and place: [facility] Main [facility]trance A at: 8 AM  No solid food after midnight prior to cath, clear liquids until 5 AM day of procedure. Verify allergies in Epic  Hold: Metformin day of procedure and 48 hours post procedure. Insulin 1/2 dose PM prior to procedure  AM meds can be  taken pre-cath with sip of water including: ASA 81 mg  Confirm patient has responsible person to drive home post procedure and for 24 hours after you arrive home: yes

## 2017-08-08 ENCOUNTER — Encounter (HOSPITAL_COMMUNITY)
Admission: RE | Disposition: A | Discharge status: Home / Self Care | Payer: Self-pay | Source: Ambulatory Visit | Attending: Interventional Cardiology

## 2017-08-08 ENCOUNTER — Other Ambulatory Visit: Payer: Self-pay

## 2017-08-08 ENCOUNTER — Encounter (HOSPITAL_COMMUNITY): Payer: Self-pay | Admitting: Interventional Cardiology

## 2017-08-08 ENCOUNTER — Ambulatory Visit (HOSPITAL_COMMUNITY)
Admission: RE | Admit: 2017-08-08 | Discharge: 2017-08-08 | Disposition: A | Discharge status: Home / Self Care | Payer: Medicare Other | Source: Ambulatory Visit | Attending: Interventional Cardiology | Admitting: Interventional Cardiology

## 2017-08-08 DIAGNOSIS — G8929 Other chronic pain: Secondary | ICD-10-CM | POA: Diagnosis not present

## 2017-08-08 DIAGNOSIS — I2510 Atherosclerotic heart disease of native coronary artery without angina pectoris: Secondary | ICD-10-CM

## 2017-08-08 DIAGNOSIS — G4730 Sleep apnea, unspecified: Secondary | ICD-10-CM | POA: Diagnosis not present

## 2017-08-08 DIAGNOSIS — E785 Hyperlipidemia, unspecified: Secondary | ICD-10-CM | POA: Diagnosis not present

## 2017-08-08 DIAGNOSIS — F17290 Nicotine dependence, other tobacco product, uncomplicated: Secondary | ICD-10-CM | POA: Diagnosis not present

## 2017-08-08 DIAGNOSIS — Z794 Long term (current) use of insulin: Secondary | ICD-10-CM | POA: Diagnosis not present

## 2017-08-08 DIAGNOSIS — E119 Type 2 diabetes mellitus without complications: Secondary | ICD-10-CM | POA: Insufficient documentation

## 2017-08-08 DIAGNOSIS — I2582 Chronic total occlusion of coronary artery: Secondary | ICD-10-CM | POA: Insufficient documentation

## 2017-08-08 DIAGNOSIS — I110 Hypertensive heart disease with heart failure: Secondary | ICD-10-CM | POA: Insufficient documentation

## 2017-08-08 DIAGNOSIS — I5022 Chronic systolic (congestive) heart failure: Secondary | ICD-10-CM

## 2017-08-08 HISTORY — PX: RIGHT/LEFT HEART CATH AND CORONARY ANGIOGRAPHY: CATH118266

## 2017-08-08 LAB — POCT I-STAT 3, ART BLOOD GAS (G3+)
Acid-base deficit: 2 mmol/L (ref 0.0–2.0)
Bicarbonate: 22.6 mmol/L (ref 20.0–28.0)
O2 Saturation: 96 %
TCO2: 24 mmol/L (ref 22–32)
pCO2 arterial: 38.7 mmHg (ref 32.0–48.0)
pH, Arterial: 7.374 (ref 7.350–7.450)
pO2, Arterial: 84 mmHg (ref 83.0–108.0)

## 2017-08-08 LAB — GLUCOSE, CAPILLARY
Glucose-Capillary: 215 mg/dL — ABNORMAL HIGH (ref 70–99)
Glucose-Capillary: 252 mg/dL — ABNORMAL HIGH (ref 70–99)

## 2017-08-08 LAB — POCT I-STAT 3, VENOUS BLOOD GAS (G3P V)
Acid-base deficit: 1 mmol/L (ref 0.0–2.0)
Bicarbonate: 24.9 mmol/L (ref 20.0–28.0)
O2 Saturation: 63 %
TCO2: 26 mmol/L (ref 22–32)
pCO2, Ven: 45.5 mmHg (ref 44.0–60.0)
pH, Ven: 7.345 (ref 7.250–7.430)
pO2, Ven: 35 mmHg (ref 32.0–45.0)

## 2017-08-08 SURGERY — RIGHT/LEFT HEART CATH AND CORONARY ANGIOGRAPHY
Anesthesia: LOCAL

## 2017-08-08 MED ORDER — VERAPAMIL HCL 2.5 MG/ML IV SOLN
INTRAVENOUS | Status: AC
  Filled 2017-08-08: qty 2

## 2017-08-08 MED ORDER — SODIUM CHLORIDE 0.9% FLUSH: 3.0000 mL | INTRAVENOUS | Status: DC | PRN

## 2017-08-08 MED ORDER — HEPARIN SODIUM (PORCINE) 1000 UNIT/ML IJ SOLN
INTRAMUSCULAR | Status: DC | PRN
  Administered 2017-08-08: 6000 [IU] via INTRAVENOUS

## 2017-08-08 MED ORDER — SODIUM CHLORIDE 0.9% FLUSH: 3.0000 mL | Freq: Two times a day (BID) | INTRAVENOUS | Status: DC

## 2017-08-08 MED ORDER — MIDAZOLAM HCL 2 MG/2ML IJ SOLN
INTRAMUSCULAR | Status: DC | PRN
  Administered 2017-08-08: 1 mg via INTRAVENOUS
  Administered 2017-08-08: 2 mg via INTRAVENOUS

## 2017-08-08 MED ORDER — HEPARIN SODIUM (PORCINE) 1000 UNIT/ML IJ SOLN
INTRAMUSCULAR | Status: AC
  Filled 2017-08-08: qty 1

## 2017-08-08 MED ORDER — FLUOXETINE HCL 20 MG PO TABS: 20.0000 mg | ORAL_TABLET | Freq: Every day | ORAL | Status: DC

## 2017-08-08 MED ORDER — FENTANYL CITRATE (PF) 100 MCG/2ML IJ SOLN
INTRAMUSCULAR | Status: AC
  Filled 2017-08-08: qty 2

## 2017-08-08 MED ORDER — FENTANYL CITRATE (PF) 100 MCG/2ML IJ SOLN
INTRAMUSCULAR | Status: DC | PRN
  Administered 2017-08-08 (×2): 25 ug via INTRAVENOUS

## 2017-08-08 MED ORDER — HEPARIN (PORCINE) IN NACL 1000-0.9 UT/500ML-% IV SOLN
INTRAVENOUS | Status: DC | PRN
  Administered 2017-08-08 (×2): 500 mL

## 2017-08-08 MED ORDER — SODIUM CHLORIDE 0.9 % IV SOLN: INTRAVENOUS | Status: DC

## 2017-08-08 MED ORDER — ACETAMINOPHEN 325 MG PO TABS: 650.0000 mg | ORAL_TABLET | ORAL | Status: DC | PRN

## 2017-08-08 MED ORDER — INSULIN DETEMIR 100 UNIT/ML FLEXPEN: 20.0000 [IU] | PEN_INJECTOR | Freq: Every day | SUBCUTANEOUS | Status: DC

## 2017-08-08 MED ORDER — METOPROLOL SUCCINATE ER 25 MG PO TB24: 25.0000 mg | ORAL_TABLET | Freq: Every day | ORAL | Status: DC

## 2017-08-08 MED ORDER — MIDAZOLAM HCL 2 MG/2ML IJ SOLN
INTRAMUSCULAR | Status: AC
  Filled 2017-08-08: qty 2

## 2017-08-08 MED ORDER — GABAPENTIN 300 MG PO CAPS: 300.0000 mg | ORAL_CAPSULE | Freq: Four times a day (QID) | ORAL | Status: DC

## 2017-08-08 MED ORDER — LIDOCAINE HCL (PF) 1 % IJ SOLN
INTRAMUSCULAR | Status: DC | PRN
  Administered 2017-08-08 (×2): 2 mL via INTRADERMAL

## 2017-08-08 MED ORDER — SODIUM CHLORIDE 0.9 % IV SOLN: 250.0000 mL | INTRAVENOUS | Status: DC | PRN

## 2017-08-08 MED ORDER — LISINOPRIL 20 MG PO TABS: 20.0000 mg | ORAL_TABLET | Freq: Every day | ORAL | Status: DC

## 2017-08-08 MED ORDER — SIMVASTATIN 20 MG PO TABS: 20.0000 mg | ORAL_TABLET | Freq: Every day | ORAL | Status: DC

## 2017-08-08 MED ORDER — HEPARIN (PORCINE) IN NACL 1000-0.9 UT/500ML-% IV SOLN
INTRAVENOUS | Status: AC
  Filled 2017-08-08: qty 1000

## 2017-08-08 MED ORDER — TRIPLE ANTIBIOTIC 5-400-5000 EX OINT: 1.0000 "application " | TOPICAL_OINTMENT | Freq: Two times a day (BID) | CUTANEOUS | Status: DC

## 2017-08-08 MED ORDER — ONDANSETRON HCL 4 MG/2ML IJ SOLN
4.0000 mg | Freq: Four times a day (QID) | INTRAMUSCULAR | Status: DC | PRN
Start: 2017-08-08 — End: 2017-08-08

## 2017-08-08 MED ORDER — IOPAMIDOL (ISOVUE-370) INJECTION 76%
INTRAVENOUS | Status: AC
  Filled 2017-08-08: qty 100

## 2017-08-08 MED ORDER — ASPIRIN 81 MG PO CHEW: 81.0000 mg | CHEWABLE_TABLET | ORAL | Status: DC

## 2017-08-08 MED ORDER — LIDOCAINE HCL (PF) 1 % IJ SOLN
INTRAMUSCULAR | Status: AC
  Filled 2017-08-08: qty 30

## 2017-08-08 MED ORDER — HEPARIN (PORCINE) IN NACL 2-0.9 UNITS/ML
INTRAMUSCULAR | Status: DC | PRN
  Administered 2017-08-08: 10 mL via INTRA_ARTERIAL

## 2017-08-08 MED ORDER — IOPAMIDOL (ISOVUE-370) INJECTION 76%
INTRAVENOUS | Status: DC | PRN
  Administered 2017-08-08: 45 mL via INTRA_ARTERIAL

## 2017-08-08 SURGICAL SUPPLY — 11 items

## 2017-08-08 NOTE — Interval H&P Note (Signed)
Cath Lab Visit (complete for each Cath Lab visit)  Clinical Evaluation Leading to the Procedure:   ACS: No.  Non-ACS:    Anginal Classification: CCS II  Anti-ischemic medical therapy: Minimal Therapy (1 class of medications)  Non-Invasive Test Results: High-risk stress test findings: cardiac mortality >3%/year Due to low EF  Prior CABG: No previous CABG   Foot ulcer on bottom of right toe.  Right PT pulse palpable.  May need vascular w/u going forward.    History and Physical Interval Note:  08/08/2017 10:52 AM  [name]  has presented today for surgery, with the diagnosis of systolic heart failure  The various methods of treatment have been discussed with the patient and family. After consideration of risks, benefits and other options for treatment, the patient has consented to  Procedure(s): RIGHT/LEFT HEART CATH AND CORONARY ANGIOGRAPHY (N/A) as a surgical intervention .  The patient's history has been reviewed, patient examined, no change in status, stable for surgery.  I have reviewed the patient's chart and labs.  Questions were answered to the patient's satisfaction.     Lance MussJayadeep Kymorah Korf

## 2017-08-08 NOTE — Research (Signed)
RAD[facility] Informed Consent   Subject Name: [name]  Subject met inclusion and exclusion criteria.  The informed consent form, study requirements and expectations were reviewed with the subject and questions and concerns were addressed prior to the signing of the consent form.  The subject verbalized understanding of the trail requirements.  The subject agreed to participate in the [facility] trial and signed the informed consent.  The informed consent was obtained prior to performance of any protocol-specific procedures for the subject.  A copy of the signed informed consent was given to the subject and a copy was placed in the subject's medical record.  Neva Seat 08/08/2017, 12:28 PM

## 2017-08-08 NOTE — Discharge Instructions (Signed)

## 2017-08-08 NOTE — Progress Notes (Signed)
Pt presented with right large toe open wound. Dr. Eldridge DaceVaranasi noted area. Clean dressing applied to toe.

## 2017-08-09 ENCOUNTER — Telehealth: Payer: Self-pay | Admitting: *Deleted

## 2017-08-09 DIAGNOSIS — L97519 Non-pressure chronic ulcer of other part of right foot with unspecified severity: Secondary | ICD-10-CM

## 2017-08-09 DIAGNOSIS — I5022 Chronic systolic (congestive) heart failure: Secondary | ICD-10-CM

## 2017-08-09 NOTE — Telephone Encounter (Signed)
Antoine PocheBranch, Jonathan F, MD  Corky CraftsVaranasi, Jayadeep S, MD  Cc: Albertine PatriciaAshworth, Giordan Fordham T, CMA        Thx Francina AmesJay, Lucky Trotta can you order a wound care consult for this patient for foot ulcer and also ABIs    Dominga FerryJ Branch MD     Orders place and forwarded message to schedulers

## 2017-08-10 ENCOUNTER — Other Ambulatory Visit: Payer: Self-pay | Admitting: Cardiology

## 2017-08-10 ENCOUNTER — Ambulatory Visit (HOSPITAL_COMMUNITY): Payer: Medicare Other | Attending: Cardiology

## 2017-08-10 ENCOUNTER — Encounter (HOSPITAL_COMMUNITY): Payer: Self-pay

## 2017-08-10 ENCOUNTER — Other Ambulatory Visit: Payer: Self-pay

## 2017-08-10 DIAGNOSIS — R209 Unspecified disturbances of skin sensation: Secondary | ICD-10-CM | POA: Insufficient documentation

## 2017-08-10 DIAGNOSIS — L97519 Non-pressure chronic ulcer of other part of right foot with unspecified severity: Secondary | ICD-10-CM

## 2017-08-10 DIAGNOSIS — L97501 Non-pressure chronic ulcer of other part of unspecified foot limited to breakdown of skin: Secondary | ICD-10-CM

## 2017-08-10 NOTE — Therapy (Signed)
[facility] [address] [place], [place], [address] Phone: [phone]   Fax:  [phone]  Wound Care Evaluation  Patient Details  Name: [name] MRN: [id number] Date of Birth: 26-Jan-1970 Referring Provider: [name], MD   Encounter Date: 08/10/2017     08/10/17 1902  PT Visits / Re-Eval  Visit Number 1  Number of Visits 13  Date for PT Re-Evaluation 09/21/17  Authorization  Authorization Type Medicare Part A and B (no auth required; no visit limit)  Authorization Time Period 08/10/17 - 09/22/17  Authorization - Visit Number 1  Authorization - Number of Visits 10  PT Time Calculation  PT Start Time 1346  PT Stop Time 1436  PT Time Calculation (min) 50 min  PT - End of Session  Activity Tolerance Patient tolerated treatment well  Behavior During Therapy [facility] for tasks assessed/performed    Past Medical History:  Diagnosis Date  . Angina pectoris with documented spasm (HCC)   . Corns and callosities   . Diabetes mellitus without complication (HCC)   . Hyperlipidemia   . Hypertension   . Other sleep apnea   . Pancreatitis     Past Surgical History:  Procedure Laterality Date  . APPENDECTOMY  2016  . OTHER SURGICAL HISTORY  2016  . RIGHT/LEFT HEART CATH AND CORONARY ANGIOGRAPHY N/A 08/08/2017   Procedure: RIGHT/LEFT HEART CATH AND CORONARY ANGIOGRAPHY;  Surgeon: [name], MD;  Location: [facility] INVASIVE CV LAB;  Service: Cardiovascular;  Laterality: N/A;    There were no vitals filed for this visit.     08/10/17 0001  Assessment  Medical Diagnosis Rt foot Wound  Referring Provider [name]  Onset Date/Surgical Date  (approximately 6-8 months ago)  Next MD Visit unsure  Prior Therapy self care, PCP removing foot calus  Precautions  Precautions None  Restrictions  Weight Bearing Restrictions No  Balance Screen  Has the patient fallen in the past 6 months No  Has the  patient had a decrease in activity level because of a fear of falling?  No  Is the patient reluctant to leave their home because of a fear of falling?  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 His wife's boyfriend also lives with them.  Prior Function  Level of Independence Independent  Vocation On disability  Cognition  Overall Cognitive Status Within Functional Limits for tasks assessed  Observation/Other Assessments  Observations small scartches/scars are located around patient's lower legs bil, he reports it is from his 3 dogs that like to jump up on him     Wound Therapy - 08/10/17 1904   Subjective Assessment  Subjective Patient arrives with his wife to appointment. Patient reports he has had the wound on his right foot at the bottom of his great toe for ~ 6-8 months. He states it began as a crack along the crease between his foot and toe. He states he saw his PCP, [name] who cut off the callus around it but the wound continued to grow larger. He reports he was referred to wound care by [name]. [name] is his cardiologist who recently diagnosed the patient with systolic HF and a nuclear  stress test revealed 30-35% LVEF. He underwent heart catheterization on 08/08/17 to address to complete blockages, catheterization performed in Rt UE. [name] has also referred him to have ABI performed on 08/22/17.  Patient and Family Stated Goals wound to heal  Date of Onset  (approximately 6-8 months ago)  Prior Treatments self care  Pain Assessment  Pain Scale 0-10  Pain Score 0  Evaluation and Treatment  Evaluation and Treatment Procedures Explained to Patient/Family Yes   Evaluation and Treatment Procedures agreed to  Wound / Incision (Open or Dehisced) 08/10/17 Other (Comment);Diabetic ulcer Toe (Comment  which one) Right wound at right first metatarsal joint line on plantar surface  Date First Assessed/Time First Assessed: 08/10/17 1400   Wound Type: (c) Other (Comment);Diabetic ulcer  Location: (c) Toe (Comment  which one)  Location Orientation: (c) Right  Wound Description (Comments): wound at right first metatarsal joint line ...  Dressing Type Gauze (Comment);Other (Comment) (medihoney, guaze 2x2, gauze wrap)  Dressing Changed Changed  Dressing Status Old drainage  Dressing Change Frequency PRN  Site / Wound Assessment Dry;Brown;Pink;Granulation tissue;Pale  % Wound base Red or Granulating 20% (deep in wound bed)  % Wound base Yellow/Fibrinous Exudate 30% (yellow/brownish wound margins)  % Wound base Other/Granulation Tissue (Comment) 50% (pale/pink)  Peri-wound Assessment Intact (callous)  Wound Length (cm) 0.9 cm  Wound Width (cm) 1.9 cm  Wound Depth (cm) 0.8 cm  Wound Volume (cm^3) 1.37 cm^3  Wound Surface Area (cm^2) 1.71 cm^2  Margins Attached edges (approximated)  Drainage Amount Scant  Drainage Description Serous  Treatment Cleansed;Debridement (Selective);Other (Comment) (medihoney, gauze wrap)  Selective Debridement  Selective Debridement - Location right plantar foot wound  Selective Debridement - Tools Used Forceps;Scalpel;Scissors  Selective Debridement - Tissue Removed devitalized/necrotic tissue, dirt, and dog hair from wound bed/margins  Wound Therapy - Assess/Plan/Recommendations  Wound Therapy - Clinical Statement [name] presents for evaluation/treatment of chronic right plantar foot wound. He reports poor diabetic foot care at this time and poor management of his type 2 DM. His wound has minimal drainage there are no signs of local infection. Periwound has significant callous that has been removed to promote healing. He  has been provided and educated on the use of a Darco shoe to unweight his Rt great toe to improve healing and reduce WB on wound. He was instructed to use RW or LRAD to mobilize while wearing the shoe for safety. He will benefit from skilled wound therapy interventions and education on diabetic foot health/care to reduce risk of future wounds and promote improved health and well being.  Wound Therapy - Functional Problem List impaired sensation, decreased mobility  Factors Delaying/Impairing Wound Healing Altered sensation;Diabetes Mellitus;Multiple medical problems;Tobacco use (CHF, CAD, DM2, HTN, HL, hx of smoking)  Hydrotherapy Plan Debridement;Dressing change;Patient/family education  Wound Therapy - Frequency 2X / week  Wound Therapy - Current Recommendations PT;Diabetic teaching  Wound Plan Measure wound 1x/week. Review use of darco shoe to unweight great toe on rigth foot and use of RW to ambulate while wearing shoe for safety. Cleanse, debride, and dress with topical agents to promote healing  Wound Therapy  Dressing  medihoney, 2x2 guaze, guaze wrap     Objective measurements completed on examination: See above findings.      08/10/17 1902  PT Education  Education Details Educated on safe use of darco shoe with RW or SPC to mobilize to prevent falls. Educated on importance of checking blood sugar daily to monitor and manage DM2. Educated  on plan for wound care and to keep dressing dry and intact until next visit. Edcuated on where to obtain cast cover to be able to shower and keep dressing dry in shower.  Person(s) Educated Patient;Other (comment)  Methods Explanation  Comprehension Verbalized understanding     PT Short Term Goals - 08/10/17 1754      PT SHORT TERM GOAL #1   Title  Patient will be indepenent with RW or LRAD to mobilize while wearing darco shoe to unweight metatarsles/great toes of Rt foot for improved wound healing.    Time  1    Period  Weeks    Target Date   08/17/17        PT Long Term Goals - 08/10/17 1758      PT LONG TERM GOAL #1   Title  Wound will decrease in surface area/volume by 50% or more to show progress towards wound healing.    Time  6    Period  Weeks    Status  New    Target Date  09/21/17      PT LONG TERM GOAL #2   Title  Patient will verbalize safe/appropriate diabetic foot care to prevent future foot wounds.    Time  6    Period  Weeks    Status  New    Target Date  09/21/17         [facility] Plan - 08/10/17 1903   Plan  Clinical Impression Statement see above  Clinical Presentation Stable  Clinical Decision Making Low  Pt will benefit from skilled therapeutic intervention in order to improve on the following deficits Decreased mobility;Decreased skin integrity;Impaired sensation;Obesity;Decreased safety awareness;Cardiopulmonary status limiting activity  Rehab Potential Fair  PT Frequency 2x / week  PT Duration 6 weeks  PT Treatment/Interventions ADLs/Self Care Home Management;Taping;Manual techniques;Patient/family education;Other (comment);DME Instruction (selective cleansing/debridement to promote wound healing)  PT Next Visit Plan Measure wound 1x/week. Review use of darco shoe to unweight great toe on rigth foot and use of RW to ambulate while wearing shoe for safety. Cleanse, debride, and dress with topical agents to promote healing.   Consulted and Agree with Plan of Care Patient       Patient will benefit from skilled therapeutic intervention in order to improve the following deficits and impairments:  Decreased mobility, Decreased skin integrity, Impaired sensation, Obesity, Decreased safety awareness, Cardiopulmonary status limiting activity  Visit Diagnosis: Chronic ulcer of great toe of right foot, unspecified ulcer stage (HCC)  Unspecified disturbances of skin sensation    Problem List Patient Active Problem List   Diagnosis Date Noted  . Chronic systolic heart failure (HCC)      Valentino Saxonachel Quinn-Brown, PT, DPT Physical Therapist with [facility]  08/10/2017 7:05 PM    [facility] [address] [place], [place], [address] Phone: [phone]   Fax:  [phone]  Name: [name] MRN: [id number] Date of Birth: [birth year]

## 2017-08-14 ENCOUNTER — Encounter (HOSPITAL_COMMUNITY): Payer: Self-pay

## 2017-08-14 ENCOUNTER — Ambulatory Visit (HOSPITAL_COMMUNITY): Payer: Medicare Other

## 2017-08-14 DIAGNOSIS — R209 Unspecified disturbances of skin sensation: Secondary | ICD-10-CM

## 2017-08-14 DIAGNOSIS — L97519 Non-pressure chronic ulcer of other part of right foot with unspecified severity: Secondary | ICD-10-CM | POA: Diagnosis not present

## 2017-08-14 NOTE — Therapy (Signed)
[facility] [address] [place], [place], [address] Phone: [phone]   Fax:  [phone]  Wound Care Therapy  Patient Details  Name: [name] MRN: [id number] Date of Birth: 05-02-1970 Referring Provider: [name], MD   Encounter Date: 08/14/2017  PT End of Session - 08/14/17 0939    Visit Number  2    Number of Visits  13    Date for PT Re-Evaluation  09/21/17 Minireassess 08/31/17    Authorization Type  Medicare Part A and B (no auth required; no visit limit)    Authorization Time Period  08/10/17 - 09/22/17    Authorization - Visit Number  2    Authorization - Number of Visits  10    PT Start Time  0903    PT Stop Time  0930    PT Time Calculation (min)  27 min    Activity Tolerance  Patient tolerated treatment well    Behavior During Therapy  [facility] for tasks assessed/performed       Past Medical History:  Diagnosis Date  . Angina pectoris with documented spasm (HCC)   . Corns and callosities   . Diabetes mellitus without complication (HCC)   . Hyperlipidemia   . Hypertension   . Other sleep apnea   . Pancreatitis     Past Surgical History:  Procedure Laterality Date  . APPENDECTOMY  2016  . OTHER SURGICAL HISTORY  2016  . RIGHT/LEFT HEART CATH AND CORONARY ANGIOGRAPHY N/A 08/08/2017   Procedure: RIGHT/LEFT HEART CATH AND CORONARY ANGIOGRAPHY;  Surgeon: Jose Crafts, MD;  Location: [facility] INVASIVE CV LAB;  Service: Cardiovascular;  Laterality: N/A;    There were no vitals filed for this visit.   Subjective Assessment - 08/14/17 1813    Subjective  Pt arrived with dressings intact, no reports of pain; stated he cannot feel much in feet.      Currently in Pain?  No/denies                Wound Therapy - 08/14/17 1020    Subjective  Pt arrived with dressings intact, no reports of pain; stated he cannot feel much in feet.      Patient and Family Stated Goals  wound to heal    Date of  Onset  -- approximately 6-8 months ago    Prior Treatments  self care    Pain Scale  0-10    Evaluation and Treatment Procedures Explained to Patient/Family  Yes    Evaluation and Treatment Procedures  agreed to    Wound Properties Date First Assessed: 08/10/17 Time First Assessed: 1400 Wound Type: Other (Comment);Diabetic ulcer Location: Toe (Comment  which one) Location Orientation: Right Wound Description (Comments): wound at right first metatarsal joint line on plantar surface Present on Admission: Yes   Dressing Type  -- medihoney, gauze and gauze wrap    Dressing Changed  Changed    Dressing Status  Old drainage    Dressing Change Frequency  PRN    Site / Wound Assessment  Clean;Dry;Pale;Pink    % Wound base Red or Granulating  20%    % Wound base Yellow/Fibrinous Exudate  30%    % Wound base Other/Granulation Tissue (Comment)  50% pale/pink    Peri-wound Assessment  Intact callous    Wound Length (cm)  0.9 cm    Wound Width (cm)  1.9 cm    Wound Surface Area (cm^2)  1.71 cm^2  Margins  Attached edges (approximated)    Drainage Amount  Scant    Drainage Description  Serous    Treatment  Cleansed;Debridement (Selective)    Selective Debridement - Location  right plantar foot wound    Selective Debridement - Tools Used  Forceps;Scalpel    Selective Debridement - Tissue Removed  devitalized/necrotic tissue, dirt, and dog hair from wound bed/margins    Wound Therapy - Clinical Statement  Pt arrived with dressings intact ambulating with RW and darco boot.  Upon removal of dressings pt and ex-wife stated vast improvements with how it looked.  Selective debridement for removal of dry skin and slough in wound bed.  Continued with medihoney and gauze dressings.  No reports of pain through session.    Wound Therapy - Functional Problem List  impaired sensation, decreased mobility    Factors Delaying/Impairing Wound Healing  Altered sensation;Diabetes Mellitus;Multiple medical  problems;Tobacco use CHF, CAD, DM2, HTN, HL, hx of smoking    Hydrotherapy Plan  Debridement;Dressing change;Patient/family education    Wound Therapy - Frequency  2X / week    Wound Therapy - Current Recommendations  PT;Diabetic teaching    Wound Plan  Measure wound 1x/week. Review use of darco shoe to unweight great toe on rigth foot and use of RW to ambulate while wearing shoe for safety. Cleanse, debride, and dress with topical agents to promote healing    Dressing   medihoney, 2x2 guaze, guaze wrap                PT Short Term Goals - 08/10/17 1754      PT SHORT TERM GOAL #1   Title  Patient will be indepenent with RW or LRAD to mobilize while wearing darco shoe to unweight metatarsles/great toes of Rt foot for improved wound healing.    Time  1    Period  Weeks    Target Date  08/17/17        PT Long Term Goals - 08/10/17 1758      PT LONG TERM GOAL #1   Title  Wound will decrease in surface area/volume by 50% or more to show progress towards wound healing.    Time  6    Period  Weeks    Status  New    Target Date  09/21/17      PT LONG TERM GOAL #2   Title  Patient will verbalize safe/appropriate diabetic foot care to prevent future foot wounds.    Time  6    Period  Weeks    Status  New    Target Date  09/21/17              Patient will benefit from skilled therapeutic intervention in order to improve the following deficits and impairments:     Visit Diagnosis: Chronic ulcer of great toe of right foot, unspecified ulcer stage (HCC)  Unspecified disturbances of skin sensation     Problem List Patient Active Problem List   Diagnosis Date Noted  . Chronic systolic heart failure [facility](HCC)     [name], LPTA; CBIS [phone]  [name] 08/14/2017, 6:25 PM  [facility] [address] [place], [place], [address] Phone: [phone]   Fax:  [phone]  Name: Jose CruiseRandolph Reynolds  [name] MRN: [id number] Date of Birth: Nov 24, 1970

## 2017-08-15 ENCOUNTER — Telehealth (HOSPITAL_COMMUNITY): Payer: Self-pay | Admitting: Internal Medicine

## 2017-08-15 NOTE — Telephone Encounter (Signed)
08/15/17  Left a message to work him in with a 2nd appt this week... He is on the wait list

## 2017-08-18 ENCOUNTER — Telehealth (HOSPITAL_COMMUNITY): Payer: Self-pay | Admitting: Internal Medicine

## 2017-08-18 ENCOUNTER — Encounter

## 2017-08-18 NOTE — Telephone Encounter (Signed)
8/2 left a message asking if he would move his 8/8 8;15 appt to 8/9 at 8:15

## 2017-08-22 ENCOUNTER — Ambulatory Visit (INDEPENDENT_AMBULATORY_CARE_PROVIDER_SITE_OTHER): Payer: Medicare Other

## 2017-08-22 ENCOUNTER — Ambulatory Visit (HOSPITAL_COMMUNITY): Payer: Medicare Other | Attending: Cardiology | Admitting: Physical Therapy

## 2017-08-22 DIAGNOSIS — L97519 Non-pressure chronic ulcer of other part of right foot with unspecified severity: Secondary | ICD-10-CM

## 2017-08-22 DIAGNOSIS — L97501 Non-pressure chronic ulcer of other part of unspecified foot limited to breakdown of skin: Secondary | ICD-10-CM

## 2017-08-22 DIAGNOSIS — R209 Unspecified disturbances of skin sensation: Secondary | ICD-10-CM | POA: Insufficient documentation

## 2017-08-22 NOTE — Therapy (Signed)
[facility] [address] [place], [place], [address] Phone: [phone]   Fax:  [phone]  Wound Care Therapy  Patient Details  Name: Jose CruiseRandolph Reynolds [name] MRN: [id number] Date of Birth: 07-13-1970 Referring Provider: Antoine PocheBranch, Jonathan F, MD   Encounter Date: 08/22/2017  PT End of Session - 08/22/17 1315    Visit Number  3    Number of Visits  13    Date for PT Re-Evaluation  09/21/17 Minireassess 08/31/17    Authorization Type  Medicare Part A and B (no auth required; no visit limit)    Authorization Time Period  08/10/17 - 09/22/17    Authorization - Visit Number  3    Authorization - Number of Visits  10    PT Start Time  0820    PT Stop Time  0900    PT Time Calculation (min)  40 min    Activity Tolerance  Patient tolerated treatment well    Behavior During Therapy  [facility] for tasks assessed/performed       Past Medical History:  Diagnosis Date  . Angina pectoris with documented spasm (HCC)   . Corns and callosities   . Diabetes mellitus without complication (HCC)   . Hyperlipidemia   . Hypertension   . Other sleep apnea   . Pancreatitis     Past Surgical History:  Procedure Laterality Date  . APPENDECTOMY  2016  . OTHER SURGICAL HISTORY  2016  . RIGHT/LEFT HEART CATH AND CORONARY ANGIOGRAPHY N/A 08/08/2017   Procedure: RIGHT/LEFT HEART CATH AND CORONARY ANGIOGRAPHY;  Surgeon: Corky CraftsVaranasi, Jayadeep S, MD;  [facility];  Service: Cardiovascular;  Laterality: N/A;    There were no vitals filed for this visit.              Wound Therapy - 08/22/17 1311    Subjective  Pt wtih dressings intact, wife with pateint    Patient and Family Stated Goals  wound to heal    Date of Onset  -- approximately 6-8 months ago    Prior Treatments  self care    Pain Scale  0-10    Pain Score  0-No pain    Evaluation and Treatment Procedures Explained to Patient/Family  Yes    Evaluation and Treatment Procedures   agreed to    Wound Properties Date First Assessed: 08/10/17 Time First Assessed: 1400 Wound Type: Other (Comment);Diabetic ulcer Location: Toe (Comment  which one) Location Orientation: Right Wound Description (Comments): wound at right first metatarsal joint line on plantar surface Present on Admission: Yes   Dressing Type  Gauze (Comment) medihoney, gauze and gauze wrap    Dressing Changed  Changed    Dressing Status  Old drainage    Dressing Change Frequency  PRN    Site / Wound Assessment  Clean;Dry;Pale;Pink    % Wound base Red or Granulating  50%    % Wound base Yellow/Fibrinous Exudate  20%    % Wound base Other/Granulation Tissue (Comment)  30% callous    Peri-wound Assessment  Intact callous    Margins  Epibole (rolled edges)    Drainage Amount  Scant    Drainage Description  Serous    Treatment  Cleansed;Debridement (Selective)    Selective Debridement - Location  plantar Rt great toe    Selective Debridement - Tools Used  Forceps;Scalpel;Scissors    Selective Debridement - Tissue Removed  devitalized/necrotic tissue, dirt, and dog hair from wound bed/margins callous  Wound Therapy - Clinical Statement  Pt with dog hair/dirt on outside of bandages, however clean on inside.  Cleansed well and debrided edges and borders of callous.  approx 70 percent of callous removed from edges to promote approximation.  Moisturzed well and continued with medihoney gel. Pt wearing darco shoe and doing well without use of AD.    Wound Therapy - Functional Problem List  impaired sensation, decreased mobility    Factors Delaying/Impairing Wound Healing  Altered sensation;Diabetes Mellitus;Multiple medical problems;Tobacco use CHF, CAD, DM2, HTN, HL, hx of smoking    Hydrotherapy Plan  Debridement;Dressing change;Patient/family education    Wound Therapy - Frequency  2X / week    Wound Therapy - Current Recommendations  PT;Diabetic teaching    Wound Plan  Measure wound 1x/week.  Continue with  appropriate dressings and heavy debridement of callous.    Dressing   medihoney, 2x2 guaze, guaze wrap                PT Short Term Goals - 08/10/17 1754      PT SHORT TERM GOAL #1   Title  Patient will be indepenent with RW or LRAD to mobilize while wearing darco shoe to unweight metatarsles/great toes of Rt foot for improved wound healing.    Time  1    Period  Weeks    Target Date  08/17/17        PT Long Term Goals - 08/10/17 1758      PT LONG TERM GOAL #1   Title  Wound will decrease in surface area/volume by 50% or more to show progress towards wound healing.    Time  6    Period  Weeks    Status  New    Target Date  09/21/17      PT LONG TERM GOAL #2   Title  Patient will verbalize safe/appropriate diabetic foot care to prevent future foot wounds.    Time  6    Period  Weeks    Status  New    Target Date  09/21/17              Patient will benefit from skilled therapeutic intervention in order to improve the following deficits and impairments:     Visit Diagnosis: Chronic ulcer of great toe of right foot, unspecified ulcer stage (HCC)  Unspecified disturbances of skin sensation     Problem List Patient Active Problem List   Diagnosis Date Noted  . Chronic systolic heart failure [facility])    [name], PTA/CLT [phone]  [name] 08/22/2017, 1:16 PM  [facility] [address] [place], [place], [address] Phone: [phone]   Fax:  [phone]  Name: [name] MRN: [id number] Date of Birth: [birth year]

## 2017-08-24 ENCOUNTER — Ambulatory Visit (HOSPITAL_COMMUNITY): Payer: Medicare Other | Admitting: Physical Therapy

## 2017-08-24 DIAGNOSIS — R209 Unspecified disturbances of skin sensation: Secondary | ICD-10-CM

## 2017-08-24 DIAGNOSIS — I10 Essential (primary) hypertension: Secondary | ICD-10-CM | POA: Diagnosis not present

## 2017-08-24 DIAGNOSIS — E1143 Type 2 diabetes mellitus with diabetic autonomic (poly)neuropathy: Secondary | ICD-10-CM | POA: Diagnosis not present

## 2017-08-24 DIAGNOSIS — E7849 Other hyperlipidemia: Secondary | ICD-10-CM | POA: Diagnosis not present

## 2017-08-24 DIAGNOSIS — L97519 Non-pressure chronic ulcer of other part of right foot with unspecified severity: Secondary | ICD-10-CM

## 2017-08-24 DIAGNOSIS — G4739 Other sleep apnea: Secondary | ICD-10-CM | POA: Diagnosis not present

## 2017-08-24 NOTE — Therapy (Signed)
[facility] [address] [place], [place], [address] Phone: [phone]   Fax:  [phone]  Wound Care Therapy  Patient Details  Name: [name] MRN: [id number] Date of Birth: 1970-02-15 Referring Provider: Antoine PocheBranch, Jonathan F, MD   Encounter Date: 08/24/2017  PT End of Session - 08/24/17 1124    Visit Number  4    Number of Visits  13    Date for PT Re-Evaluation  09/21/17    Authorization Type  Medicare Part A and B (no auth required; no visit limit)    Authorization Time Period  08/10/17 - 09/22/17    Authorization - Visit Number  4    Authorization - Number of Visits  10    PT Start Time  0950    PT Stop Time  1025    PT Time Calculation (min)  35 min    Activity Tolerance  Patient tolerated treatment well    Behavior During Therapy  [facility] for tasks assessed/performed       Past Medical History:  Diagnosis Date  . Angina pectoris with documented spasm (HCC)   . Corns and callosities   . Diabetes mellitus without complication (HCC)   . Hyperlipidemia   . Hypertension   . Other sleep apnea   . Pancreatitis     Past Surgical History:  Procedure Laterality Date  . APPENDECTOMY  2016  . OTHER SURGICAL HISTORY  2016  . RIGHT/LEFT HEART CATH AND CORONARY ANGIOGRAPHY N/A 08/08/2017   Procedure: RIGHT/LEFT HEART CATH AND CORONARY ANGIOGRAPHY;  Surgeon: Corky CraftsVaranasi, Jayadeep S, MD;  [facility];  Service: Cardiovascular;  Laterality: N/A;    There were no vitals filed for this visit.              Wound Therapy - 08/24/17 1120    Subjective  Dressings intact, however sock is wet from pushing his toe down with walking.     Patient and Family Stated Goals  wound to heal    Date of Onset  --    Prior Treatments  self care    Pain Score  0-No pain    Evaluation and Treatment Procedures Explained to Patient/Family  Yes    Evaluation and Treatment Procedures  agreed to    Wound Properties Date  First Assessed: 08/10/17 Time First Assessed: 1400 Wound Type: Other (Comment);Diabetic ulcer Location: Toe (Comment  which one) Location Orientation: Right Wound Description (Comments): wound at right first metatarsal joint line on plantar surface Present on Admission: Yes   Dressing Type  Gauze (Comment)    Dressing Changed  Changed    Dressing Status  Old drainage    Dressing Change Frequency  PRN    Site / Wound Assessment  Clean;Dry;Pale;Pink    % Wound base Red or Granulating  60%    % Wound base Yellow/Fibrinous Exudate  20%    % Wound base Other/Granulation Tissue (Comment)  20%    Peri-wound Assessment  Intact    Margins  Epibole (rolled edges)    Drainage Amount  Scant    Drainage Description  Serous    Treatment  Cleansed;Debridement (Selective)    Selective Debridement - Location  plantar Rt great toe    Selective Debridement - Tools Used  Forceps;Scalpel;Scissors    Selective Debridement - Tissue Removed  devitalized/necrotic tissue, dirt, and dog hair from wound bed/margins callous    Wound Therapy - Clinical Statement  Wound and dressing absent from  debris or dog hair this session.  Calloused area heavily debrided around borders as well as wound bed to promote approximation.  Did not measure this session, however appears larger due to removal of devitalized tissue.  moisturized border with vaseline prior to dressing with medihoney and gauze.    Wound Therapy - Functional Problem List  impaired sensation, decreased mobility    Factors Delaying/Impairing Wound Healing  Altered sensation;Diabetes Mellitus;Multiple medical problems;Tobacco use    Hydrotherapy Plan  Debridement;Dressing change;Patient/family education    Wound Therapy - Frequency  2X / week    Wound Therapy - Current Recommendations  PT;Diabetic teaching    Wound Plan  Measure wound 1x/week.  Continue with appropriate dressings and heavy debridement of callous.    Dressing   medihoney, 2x2 guaze, guaze wrap                             Patient will benefit from skilled therapeutic intervention in order to improve the following deficits and impairments:     Visit Diagnosis: Chronic ulcer of great toe of right foot, unspecified ulcer stage (HCC)  Unspecified disturbances of skin sensation     Problem List Patient Active Problem List   Diagnosis Date Noted  . Chronic systolic heart failure [facility])    [name], PTA/CLT [phone]  [name] 08/24/2017, 11:25 AM  [facility] [address] [place], [place], [address] Phone: [phone]   Fax:  [phone]  Name: [name] MRN: [id number] Date of Birth: 1970-07-16

## 2017-08-25 ENCOUNTER — Ambulatory Visit: Payer: Medicare Other | Admitting: Student

## 2017-08-25 NOTE — Progress Notes (Deleted)
Cardiology Office Note    Date:  08/25/2017   ID:  [name], DOB [birth year], MRN [id number]  PCP:  Toma Deiters, MD  Cardiologist: Dina Rich, MD    No chief complaint on file.   History of Present Illness:    [name] is a 47 y.o. male with past medical history of CAD (as documented by recent cardiac catheterization in 07/2017), HTN, HLD, Type 2 DM and prior tobacco use who presents to the office today for follow-up from his recent cardiac catheterization.  He was last examined by [name] in 07/2017 reported having worsening dyspnea on exertion.  He denied any associated chest discomfort.  An echocardiogram was performed and showed his EF was reduced at 30 to 35%, therefore a cardiac catheterization was recommended for definitive evaluation.  He was started on Toprol-XL 25 mg daily and continued on Lisinopril 20 mg daily.  He underwent a cardiac catheterization on 08/08/2017 by Dr. Eldridge Dace which showed CTO of the mid RCA and CTO of the proximal LCx which were filled by collaterals.  Also had 50% RI stenosis and 25% mid LAD stenosis.  He was started on ASA 81 mg daily with consideration of CTO PCI attempt if the inferior and lateral myocardium was viable following evaluation of his foot ulcer.  ABIs were performed in the outpatient setting and were within normal range.  He was referred to the [facility]ound [facility].  Past Medical History:  Diagnosis Date  . Angina pectoris with documented spasm (HCC)   . Corns and callosities   . Diabetes mellitus without complication (HCC)   . Hyperlipidemia   . Hypertension   . Other sleep apnea   . Pancreatitis     Past Surgical History:  Procedure Laterality Date  . APPENDECTOMY  2016  . OTHER SURGICAL HISTORY  2016  . RIGHT/LEFT HEART CATH AND CORONARY ANGIOGRAPHY N/A 08/08/2017   Procedure: RIGHT/LEFT HEART CATH AND CORONARY ANGIOGRAPHY;  Surgeon: [name], MD;  [facility] INVAS[facility] CV LAB;  Service:  Cardiovascular;  Laterality: N/A;    Current Medications: Outpatient Medications Prior to Visit  Medication Sig Dispense Refill  . FLUoxetine (PROZAC) 20 MG tablet Take 20 mg by mouth daily.    Marland Kitchen gabapentin (NEURONTIN) 300 MG capsule Take 300 mg by mouth 4 (four) times daily.    Marland Kitchen LEVEMIR FLEXTOUCH 100 UNIT/ML Pen Inject 20 Units into the skin at bedtime.  2  . lisinopril (PRINIVIL,ZESTRIL) 20 MG tablet Take 1 tablet (20 mg total) by mouth daily. 90 tablet 3  . metFORMIN (GLUCOPHAGE) 1000 MG tablet Take 1,000 mg by mouth 2 (two) times daily with a meal.    . metoprolol succinate (TOPROL XL) 25 MG 24 hr tablet Take 1 tablet (25 mg total) by mouth daily. 90 tablet 3  . naproxen (NAPROSYN) 500 MG tablet Take 500 mg by mouth 2 (two) times daily with a meal.    . neomycin-bacitracin-polymyxin (NEOSPORIN) [phone] ointment Apply 1 application topically 2 (two) times daily. On toe    . simvastatin (ZOCOR) 20 MG tablet Take 20 mg by mouth daily.     No facility-administered medications prior to visit.      Allergies:   Patient has no known allergies.   Social History   Socioeconomic History  . Marital status: Married    Spouse name: Not on file  . Number of children: Not on file  . Years of education: Not on file  . Highest education level:  Not on file  Occupational History  . Not on file  Social Needs  . Financial resource strain: Not on file  . Food insecurity:    Worry: Not on file    Inability: Not on file  . Transportation needs:    Medical: Not on file    Non-medical: Not on file  Tobacco Use  . Smoking status: Former Smoker    Types: E-cigarettes    Last attempt to quit: 03/05/2017    Years since quitting: 0.4  . Smokeless tobacco: Never Used  . Tobacco comment: no cigarettes since 02/2017  Substance and Sexual Activity  . Alcohol use: Never    Frequency: Never  . Drug use: Never  . Sexual activity: Not on file  Lifestyle  . Physical activity:    Days per week: Not  on file    Minutes per session: Not on file  . Stress: Not on file  Relationships  . Social connections:    Talks on phone: Not on file    Gets together: Not on file    Attends religious service: Not on file    Active member of club or organization: Not on file    Attends meetings of clubs or organizations: Not on file    Relationship status: Not on file  Other Topics Concern  . Not on file  Social History Narrative  . Not on file     Family History:  The patient's ***family history includes Aneurysm in his father.   Review of Systems:   Please see the history of present illness.     General:  No chills, fever, night sweats or weight changes.  Cardiovascular:  No chest pain, dyspnea on exertion, edema, orthopnea, palpitations, paroxysmal nocturnal dyspnea. Dermatological: No rash, lesions/masses Respiratory: No cough, dyspnea Urologic: No hematuria, dysuria Abdominal:   No nausea, vomiting, diarrhea, bright red blood per rectum, melena, or hematemesis Neurologic:  No visual changes, wkns, changes in mental status. All other systems reviewed and are otherwise negative except as noted above.   Physical Exam:    VS:  There were no vitals taken for this visit.   General: Well developed, well nourished,male appearing in no acute distress. Head: Normocephalic, atraumatic, sclera non-icteric, no xanthomas, nares are without discharge.  Neck: No carotid bruits. JVD not elevated.  Lungs: Respirations regular and unlabored, without wheezes or rales.  Heart: ***Regular rate and rhythm. No S3 or S4.  No murmur, no rubs, or gallops appreciated. Abdomen: Soft, non-tender, non-distended with normoactive bowel sounds. No hepatomegaly. No rebound/guarding. No obvious abdominal masses. Msk:  Strength and tone appear normal for age. No joint deformities or effusions. Extremities: No clubbing or cyanosis. No edema.  Distal pedal pulses are 2+ bilaterally. Neuro: Alert and oriented X 3. Moves  all extremities spontaneously. No focal deficits noted. Psych:  Responds to questions appropriately with a normal affect. Skin: No rashes or lesions noted  Wt Readings from Last 3 Encounters:  08/08/17 (!) 303 lb (137.4 kg)  08/02/17 (!) 301 lb (136.5 kg)  06/20/17 (!) 302 lb (137 kg)        Studies/Labs Reviewed:   EKG:  EKG is*** ordered today.  The ekg ordered today demonstrates ***  Recent Labs: 08/02/2017: BUN 27; Creatinine, Ser 0.84; Hemoglobin 14.1; Platelets 156; Potassium 4.1; Sodium 137; TSH 1.018   Lipid Panel No results found for: CHOL, TRIG, HDL, CHOLHDL, VLDL, LDLCALC, LDLDIRECT  Additional studies/ records that were reviewed today include:   Echocardiogram: 07/24/2017 Study  Conclusions  - Procedure narrative: Transthoracic echocardiography. Image   quality was fair. Intravenous contrast (Definity) was   administered. - Left ventricle: The cavity size was moderately dilated. There was   mild concentric hypertrophy. Systolic function was moderately to   severely reduced. The estimated ejection fraction was in the   range of 30% to 35%. Doppler parameters are consistent with   abnormal left ventricular relaxation (grade 1 diastolic   dysfunction). Doppler parameters are consistent with high   ventricular filling pressure. - Regional wall motion abnormality: Akinesis of the mid-apical   inferior and basal-mid inferolateral myocardium; severe   hypokinesis of the basal inferior myocardium. - Mitral valve: Mildly calcified annulus. There was mild   regurgitation. - Left atrium: The atrium was mildly dilated. - Right ventricle: Systolic function was mildly reduced. Velocity   parameters appear overestimated.  Cardiac Catheterization: 08/08/2017  Mid RCA lesion is 100% stenosed. Vessel fills by collaterals.  Prox Cx to Mid Cx lesion is 100% stenosed. Vesel fills by left to left collaterals.  Ramus lesion is 50% stenosed.  Mid LAD lesion is 25%  stenosed.  LV end diastolic pressure is moderately elevated.  There is no aortic valve stenosis.  Hemodynamic findings consistent with mild pulmonary hypertension.  Ao sat: 96%, PA sat 63%; CO: 5.4 L.min; CI: 2.1    Recommend Aspirin 81mg  daily for moderate CAD.  Consider CTO PCI attempt if the inferior and lateral myocardium is viable, after his foot ulcer has been addressed.   May need additional diuresis.    Continue treatment for sleep apnea.  Weight loss was also recommended.    Restart metformin in 48 hours.  Lower Extremity Dopplers: 08/22/2017 Final Interpretation: Right: Resting right ankle-brachial index is within normal range. No evidence of significant right lower extremity arterial disease. The right toe-brachial index is normal.  Left: Resting left ankle-brachial index is within normal range. No evidence of significant left lower extremity arterial disease. The left toe-brachial index is normal. PPG tracings appear dampened.  Assessment:    No diagnosis found.   Plan:   In order of problems listed above:  1. ***    Medication Adjustments/Labs and Tests Ordered: Current medicines are reviewed at length with the patient today.  Concerns regarding medicines are outlined above.  Medication changes, Labs and Tests ordered today are listed in the Patient Instructions below. There are no Patient Instructions on file for this visit.   Signed, Ellsworth LennoxBrittany M Akyra Bouchie, PA-C  08/25/2017 11:23 AM    [facility] [address] [place], [place] [address] Phone: [phone]

## 2017-08-28 ENCOUNTER — Telehealth: Payer: Self-pay | Admitting: Cardiology

## 2017-08-28 NOTE — Telephone Encounter (Signed)
If topical its ok, if pills then I would avoid. Have him clarify which type   Dominga FerryJ Debralee Braaksma MD

## 2017-08-28 NOTE — Telephone Encounter (Signed)
Fwd to provider

## 2017-08-28 NOTE — Telephone Encounter (Signed)
New prescription by doctor Voltaren  - would like to know if he can take

## 2017-08-28 NOTE — Telephone Encounter (Signed)
Patient states that it is the Voltaren gel (topical).  Informed patient that this will be fine to use.

## 2017-08-29 ENCOUNTER — Ambulatory Visit (HOSPITAL_COMMUNITY): Payer: Medicare Other | Admitting: Physical Therapy

## 2017-08-29 DIAGNOSIS — R209 Unspecified disturbances of skin sensation: Secondary | ICD-10-CM

## 2017-08-29 DIAGNOSIS — L97519 Non-pressure chronic ulcer of other part of right foot with unspecified severity: Secondary | ICD-10-CM

## 2017-08-29 NOTE — Therapy (Signed)
[facility] [address] [place], [place], [address] Phone: [phone]   Fax:  [phone]  Wound Care Therapy  Patient Details  Name: Jose CruiseRandolph Reynolds [name] MRN: [id number] Date of Birth: [birth year] Referring Provider: Antoine PocheBranch, Jonathan F, MD   Encounter Date: 08/29/2017  PT End of Session - 08/29/17 0929    Visit Number  5    Number of Visits  13    Date for PT Re-Evaluation  09/21/17   Minireassess 08/31/17   Authorization Type  Medicare Part A and B (no auth required; no visit limit)    Authorization Time Period  08/10/17 - 09/22/17    Authorization - Visit Number  5    Authorization - Number of Visits  10    PT Start Time  0825    PT Stop Time  0850    PT Time Calculation (min)  25 min    Activity Tolerance  Patient tolerated treatment well    Behavior During Therapy  [facility] for tasks assessed/performed       Past Medical History:  Diagnosis Date  . Angina pectoris with documented spasm (HCC)   . Corns and callosities   . Diabetes mellitus without complication (HCC)   . Hyperlipidemia   . Hypertension   . Other sleep apnea   . Pancreatitis     Past Surgical History:  Procedure Laterality Date  . APPENDECTOMY  2016  . OTHER SURGICAL HISTORY  2016  . RIGHT/LEFT HEART CATH AND CORONARY ANGIOGRAPHY N/A 08/08/2017   Procedure: RIGHT/LEFT HEART CATH AND CORONARY ANGIOGRAPHY;  Surgeon: Corky CraftsVaranasi, Jayadeep S, MD;  [facility];  Service: Cardiovascular;  Laterality: N/A;    There were no vitals filed for this visit.              Wound Therapy - 08/29/17 0916    Subjective  pt late for appt.  Dressings intact    Pain Score  0-No pain    Evaluation and Treatment Procedures Explained to Patient/Family  Yes    Evaluation and Treatment Procedures  agreed to    Wound Properties Date First Assessed: 08/10/17 Time First Assessed: 1400 Wound Type: Other (Comment);Diabetic ulcer , skin crack developed into larger  wound  Location: Toe (Comment  which one) , plantar surface of great toe  Location Orientation: Right , plantar surface  Wound Description (Comments): wound at right first metatarsal joint line on plantar surface Present on Admission: Yes   Dressing Type  Gauze (Comment)    Dressing Changed  Changed    Dressing Status  Old drainage    Dressing Change Frequency  PRN    Site / Wound Assessment  Clean;Dry;Pale;Pink    % Wound base Red or Granulating  90%    % Wound base Other/Granulation Tissue (Comment)  10%   callous   Peri-wound Assessment  Intact    Wound Length (cm)  0.9 cm    Wound Width (cm)  1.5 cm    Wound Depth (cm)  0.5 cm    Wound Volume (cm^3)  0.68 cm^3    Wound Surface Area (cm^2)  1.35 cm^2    Margins  Epibole (rolled edges)    Drainage Amount  Scant    Drainage Description  Serosanguineous    Treatment  Cleansed;Debridement (Selective)    Selective Debridement - Location  plantar Rt great toe    Selective Debridement - Tools Used  Forceps;Scalpel;Scissors    Selective Debridement - Tissue Removed  devitalized/necrotic tissue, dirt, and dog hair from wound bed/margins callous    Wound Therapy - Clinical Statement  Overall improvement noted with reduced width of wound by 0.4cm.  Continued removal of dry callous from perimeter of wound to promote approximation and filling in of deep crevous.  Changed dressing to xeroform today to help increase moisture.  Wound no longer with sllough present.      Wound Therapy - Functional Problem List  impaired sensation, decreased mobility    Factors Delaying/Impairing Wound Healing  Altered sensation;Diabetes Mellitus;Multiple medical problems;Tobacco use    Hydrotherapy Plan  Debridement;Dressing change;Patient/family education    Wound Therapy - Frequency  2X / week    Wound Therapy - Current Recommendations  PT;Diabetic teaching    Wound Plan  Measure wound 1x/week.  Continue with appropriate dressings and heavy debridement of callous.     Dressing   xeroform, 2x2 guaze, guaze wrap                PT Short Term Goals - 08/10/17 1754      PT SHORT TERM GOAL #1   Title  Patient will be indepenent with RW or LRAD to mobilize while wearing darco shoe to unweight metatarsles/great toes of Rt foot for improved wound healing.    Time  1    Period  Weeks    Target Date  08/17/17        PT Long Term Goals - 08/10/17 1758      PT LONG TERM GOAL #1   Title  Wound will decrease in surface area/volume by 50% or more to show progress towards wound healing.    Time  6    Period  Weeks    Status  New    Target Date  09/21/17      PT LONG TERM GOAL #2   Title  Patient will verbalize safe/appropriate diabetic foot care to prevent future foot wounds.    Time  6    Period  Weeks    Status  New    Target Date  09/21/17              Patient will benefit from skilled therapeutic intervention in order to improve the following deficits and impairments:     Visit Diagnosis: Chronic ulcer of great toe of right foot, unspecified ulcer stage (HCC)  Unspecified disturbances of skin sensation     Problem List Patient Active Problem List   Diagnosis Date Noted  . Chronic systolic heart failure [facility](HCC)    Lurena Nidamy B Frazier, PTA/CLT [phone]  Lurena NidaFrazier, Amy B 08/29/2017, 9:29 AM  [facility] [address] [place], [place], [address] Phone: [phone]   Fax:  [phone]  Name: Jose CruiseRandolph Reynolds [name] MRN: [id number] Date of Birth: [birth year]

## 2017-08-31 ENCOUNTER — Ambulatory Visit (HOSPITAL_COMMUNITY): Payer: Medicare Other | Admitting: Physical Therapy

## 2017-08-31 DIAGNOSIS — R209 Unspecified disturbances of skin sensation: Secondary | ICD-10-CM | POA: Diagnosis not present

## 2017-08-31 DIAGNOSIS — L97519 Non-pressure chronic ulcer of other part of right foot with unspecified severity: Secondary | ICD-10-CM

## 2017-08-31 NOTE — Therapy (Signed)
[facility] [address] [place], [place], [address] Phone: [phone]   Fax:  [phone]  Wound Care Therapy  Patient Details  Name: [name] MRN: [id number] Date of Birth: [birth year] Referring Provider: Antoine PocheBranch, Jonathan F, MD   Encounter Date: 08/31/2017  PT End of Session - 08/31/17 0857    Visit Number  6    Number of Visits  13    Date for PT Re-Evaluation  09/21/17   Minireassess 08/31/17   Authorization Type  Medicare Part A and B (no auth required; no visit limit)    Authorization Time Period  08/10/17 - 09/22/17    Authorization - Visit Number  6    Authorization - Number of Visits  10    PT Start Time  0812    PT Stop Time  0840    PT Time Calculation (min)  28 min    Activity Tolerance  Patient tolerated treatment well    Behavior During Therapy  [facility] assessed/performed       Past Medical History:  Diagnosis Date  . Angina pectoris with documented spasm (HCC)   . Corns and callosities   . Diabetes mellitus without complication (HCC)   . Hyperlipidemia   . Hypertension   . Other sleep apnea   . Pancreatitis     Past Surgical History:  Procedure Laterality Date  . APPENDECTOMY  2016  . OTHER SURGICAL HISTORY  2016  . RIGHT/LEFT HEART CATH AND CORONARY ANGIOGRAPHY N/A 08/08/2017   Procedure: RIGHT/LEFT HEART CATH AND CORONARY ANGIOGRAPHY;  Surgeon: Corky CraftsVaranasi, Jayadeep S, MD;  [facility];  Service: Cardiovascular;  Laterality: N/A;    There were no vitals filed for this visit.              Wound Therapy - 08/31/17 0848    Subjective  Dressings intact with no issues.  Wearing his regular croc now.    Evaluation and Treatment Procedures Explained to Patient/Family  Yes    Evaluation and Treatment Procedures  agreed to    Wound Properties Date First Assessed: 08/10/17 Time First Assessed: 1400 Wound Type: Other (Comment);Diabetic ulcer , skin crack developed into  larger wound  Location: Toe (Comment  which one) , plantar surface of great toe  Location Orientation: Right , plantar surface  Wound Description (Comments): wound at right first metatarsal joint line on plantar surface Present on Admission: Yes   Dressing Type  Gauze (Comment)    Dressing Changed  Changed    Dressing Status  Old drainage    Dressing Change Frequency  PRN    Site / Wound Assessment  Clean;Dry;Pale;Pink    % Wound base Red or Granulating  95%    % Wound base Other/Granulation Tissue (Comment)  5%   callous   Peri-wound Assessment  Intact    Margins  Epibole (rolled edges)    Drainage Amount  Scant    Drainage Description  Serosanguineous    Treatment  Cleansed;Debridement (Selective)    Selective Debridement - Location  plantar Rt great toe    Selective Debridement - Tools Used  Forceps;Scalpel;Scissors    Selective Debridement - Tissue Removed  mostly callous, some devitalized tissue    Wound Therapy - Clinical Statement  continued improvement and approximation of wound.  Less debridement within woundbed needed, only superior border that is heavily calloused to promote approximation and decrease chance of cracking back opened.  Xeroform used along with gauze  and medipore tape to secure.     Wound Therapy - Functional Problem List  impaired sensation, decreased mobility    Factors Delaying/Impairing Wound Healing  Altered sensation;Diabetes Mellitus;Multiple medical problems;Tobacco use    Hydrotherapy Plan  Debridement;Dressing change;Patient/family education    Wound Therapy - Frequency  2X / week    Wound Therapy - Current Recommendations  PT;Diabetic teaching    Wound Plan  Measure wound 1x/week.  Continue with appropriate dressings and heavy debridement of callous.    Dressing   xeroform, 2x2 guaze, guaze wrap                PT Short Term Goals - 08/10/17 1754      PT SHORT TERM GOAL #1   Title  Patient will be indepenent with RW or LRAD to mobilize while  wearing darco shoe to unweight metatarsles/great toes of Rt foot for improved wound healing.    Time  1    Period  Weeks    Target Date  08/17/17        PT Long Term Goals - 08/10/17 1758      PT LONG TERM GOAL #1   Title  Wound will decrease in surface area/volume by 50% or more to show progress towards wound healing.    Time  6    Period  Weeks    Status  New    Target Date  09/21/17      PT LONG TERM GOAL #2   Title  Patient will verbalize safe/appropriate diabetic foot care to prevent future foot wounds.    Time  6    Period  Weeks    Status  New    Target Date  09/21/17              Patient will benefit from skilled therapeutic intervention in order to improve the following deficits and impairments:     Visit Diagnosis: Chronic ulcer of great toe of right foot, unspecified ulcer stage (HCC)  Unspecified disturbances of skin sensation     Problem List Patient Active Problem List   Diagnosis Date Noted  . Chronic systolic heart failure [facility](HCC)    Lurena Nidamy B Daje Stark, PTA/CLT [phone]  Lurena NidaFrazier, Conlee Sliter B 08/31/2017, 8:58 AM  [facility] [address] [place], [place], [address] Phone: [phone]   Fax:  [phone]  Name: [name] MRN: [id number] Date of Birth: 1970-11-16

## 2017-09-02 DIAGNOSIS — W260XXA Contact with knife, initial encounter: Secondary | ICD-10-CM | POA: Diagnosis not present

## 2017-09-02 DIAGNOSIS — S61317A Laceration without foreign body of left little finger with damage to nail, initial encounter: Secondary | ICD-10-CM | POA: Diagnosis not present

## 2017-09-02 DIAGNOSIS — S61217A Laceration without foreign body of left little finger without damage to nail, initial encounter: Secondary | ICD-10-CM | POA: Diagnosis not present

## 2017-09-05 ENCOUNTER — Ambulatory Visit (HOSPITAL_COMMUNITY): Payer: Medicare Other | Admitting: Physical Therapy

## 2017-09-05 ENCOUNTER — Other Ambulatory Visit: Payer: Self-pay

## 2017-09-05 DIAGNOSIS — R209 Unspecified disturbances of skin sensation: Secondary | ICD-10-CM | POA: Diagnosis not present

## 2017-09-05 DIAGNOSIS — L97519 Non-pressure chronic ulcer of other part of right foot with unspecified severity: Secondary | ICD-10-CM

## 2017-09-05 NOTE — Therapy (Signed)
[place], [place], [address] Phone: [phone]   Fax:  [phone]  Wound Care Therapy  Patient Details  Name: [name] MRN: [id number] Date of Birth: July 21, 1970 Referring Provider: Arnoldo Lenis, MD  PHYSICAL THERAPY DISCHARGE SUMMARY  Visits from Start of Care: 7  Current functional level related to goals / functional outcomes: See below   Remaining deficits: callous   Education / Equipment: The importance of keeping his foot clean, wearing shoes and socks on at all times, the benefit of diabetic shoes  Plan: Patient agrees to discharge.  Patient goals were met. Patient is being discharged due to meeting the stated rehab goals.  ?????       Encounter Date: 09/05/2017  PT End of Session - 09/05/17 0859    Visit Number  7    Number of Visits  7    Date for PT Re-Evaluation  09/21/17   Minireassess 08/31/17   Authorization Type  Medicare Part A and B (no auth required; no visit limit)    Authorization Time Period  08/10/17 - 09/22/17    Authorization - Visit Number  7    Authorization - Number of Visits  7    PT Start Time  0815    PT Stop Time  0850    PT Time Calculation (min)  35 min    Activity Tolerance  Patient tolerated treatment well    Behavior During Therapy  [facility] for tasks assessed/performed       Past Medical History:  Diagnosis Date  . Angina pectoris with documented spasm ([place])   . Corns and callosities   . Diabetes mellitus without complication ([place])   . Hyperlipidemia   . Hypertension   . Other sleep apnea   . Pancreatitis     Past Surgical History:  Procedure Laterality Date  . APPENDECTOMY  2016  . OTHER SURGICAL HISTORY  2016  . RIGHT/LEFT HEART CATH AND CORONARY ANGIOGRAPHY N/A 08/08/2017   Procedure: RIGHT/LEFT HEART CATH AND CORONARY ANGIOGRAPHY;  Surgeon: Jettie Booze, MD;  Location: [facility] CV LAB;  Service: Cardiovascular;  Laterality:  N/A;    There were no vitals filed for this visit.              Wound Therapy - 09/05/17 0851    Subjective  Pt states that he has tried diabetic shoes about five years ago from [facility] but they never fit right.      Pain Score  0-No pain    Evaluation and Treatment Procedures Explained to Patient/Family  Yes    Evaluation and Treatment Procedures  agreed to    Wound Properties Date First Assessed: 08/10/17 Time First Assessed: 1400 Wound Type: Other (Comment);Diabetic ulcer , skin crack developed into larger wound  Location: Toe (Comment  which one) , plantar surface of great toe  Location Orientation: Right , plantar surface  Wound Description (Comments): wound at right first metatarsal joint line on plantar surface Present on Admission: Yes   Dressing Type  Gauze (Comment)    Dressing Changed  Other (Comment)    Dressing Status  None    % Wound base Red or Granulating  100%    Peri-wound Assessment  --   calloused   Treatment  Cleansed;Debridement (Selective)    Selective Debridement - Location  plantar aspect of Rt great toe     Selective Debridement - Tools Used  Forceps;Scissors    Selective Debridement -  Tissue Removed  callous    Wound Therapy - Clinical Statement  Upon inspection today no wound is apparent, however pt continues to have significant callous.  Explained to pt that he will need to cleanse and moisturize area twice a day until callous area is gone.  Therapist recommended getting a prescription for diabetic shoes from his MD and then obtaining the shoes at an orthotic/prosthetic company not a pharmacy.  PT verbalized understanding.      Wound Therapy - Functional Problem List  impaired sensation     Factors Delaying/Impairing Wound Healing  Altered sensation;Diabetes Mellitus    Hydrotherapy Plan  --   Discharge pt to self care.              PT Education - 09/05/17 0858    Education Details  The importance of cleansing /moisturizing area twice a day  and obtaining diabetic shoe.     Person(s) Educated  Patient    Methods  Explanation    Comprehension  Verbalized understanding       PT Short Term Goals - 09/05/17 0902      PT SHORT TERM GOAL #1   Title  Patient will be indepenent with RW or LRAD to mobilize while wearing darco shoe to unweight metatarsles/great toes of Rt foot for improved wound healing.    Time  1    Period  Weeks    Status  Achieved        PT Long Term Goals - 09/05/17 0902      PT LONG TERM GOAL #1   Title  Wound will decrease in surface area/volume by 50% or more to show progress towards wound healing.    Time  6    Period  Weeks    Status  Achieved      PT LONG TERM GOAL #2   Title  Patient will verbalize safe/appropriate diabetic foot care to prevent future foot wounds.    Time  6    Period  Weeks    Status  Achieved            Plan - 09/05/17 0900    Clinical Impression Statement  see above     Rehab Potential  Fair    PT Frequency  2x / week    PT Duration  6 weeks    PT Treatment/Interventions  ADLs/Self Care Home Management;Taping;Manual techniques;Patient/family education;Other (comment);DME Instruction   selective cleansing/debridement to promote wound healing   PT Next Visit Plan  PT no longer has a wound just callous area.  Explained to pt that removing callous is not skilled care for therapist and he may want to see a podiatrist.  Pt instructed in foot care as well as the importance of aqcquiring diabeti8c shoes.     Consulted and Agree with Plan of Care  Patient       Patient will benefit from skilled therapeutic intervention in order to improve the following deficits and impairments:  Decreased mobility, Decreased skin integrity, Impaired sensation, Obesity, Decreased safety awareness, Cardiopulmonary status limiting activity  Visit Diagnosis: Chronic ulcer of great toe of right foot, unspecified ulcer stage (HCC)  Unspecified disturbances of skin sensation     Problem  List Patient Active Problem List   Diagnosis Date Noted  . Chronic systolic heart failure [facility])     Rayetta Humphrey, PT CLT [phone] 09/05/2017, 9:04 AM  [place], [place], [address] Phone: [phone]  Fax:  [phone]  Name: [name] MRN: [id number] Date of Birth: 1970/07/17

## 2017-09-08 ENCOUNTER — Ambulatory Visit (HOSPITAL_COMMUNITY): Payer: Medicare Other

## 2017-09-11 DIAGNOSIS — S61217D Laceration without foreign body of left little finger without damage to nail, subsequent encounter: Secondary | ICD-10-CM | POA: Diagnosis not present

## 2017-09-12 ENCOUNTER — Ambulatory Visit (HOSPITAL_COMMUNITY): Payer: Medicare Other

## 2017-09-15 ENCOUNTER — Ambulatory Visit (HOSPITAL_COMMUNITY): Payer: Medicare Other

## 2017-09-19 ENCOUNTER — Ambulatory Visit (HOSPITAL_COMMUNITY): Payer: Medicare Other

## 2017-09-21 ENCOUNTER — Ambulatory Visit (HOSPITAL_COMMUNITY): Payer: Medicare Other

## 2017-09-28 DIAGNOSIS — E7849 Other hyperlipidemia: Secondary | ICD-10-CM | POA: Diagnosis not present

## 2017-09-28 DIAGNOSIS — I10 Essential (primary) hypertension: Secondary | ICD-10-CM | POA: Diagnosis not present

## 2017-09-28 DIAGNOSIS — E1143 Type 2 diabetes mellitus with diabetic autonomic (poly)neuropathy: Secondary | ICD-10-CM | POA: Diagnosis not present

## 2017-09-29 DIAGNOSIS — W010XXA Fall on same level from slipping, tripping and stumbling without subsequent striking against object, initial encounter: Secondary | ICD-10-CM | POA: Diagnosis not present

## 2017-09-29 DIAGNOSIS — W208XXA Other cause of strike by thrown, projected or falling object, initial encounter: Secondary | ICD-10-CM | POA: Diagnosis not present

## 2017-09-29 DIAGNOSIS — S92424B Nondisplaced fracture of distal phalanx of right great toe, initial encounter for open fracture: Secondary | ICD-10-CM | POA: Diagnosis not present

## 2017-09-29 DIAGNOSIS — I2510 Atherosclerotic heart disease of native coronary artery without angina pectoris: Secondary | ICD-10-CM | POA: Diagnosis not present

## 2017-09-29 DIAGNOSIS — I10 Essential (primary) hypertension: Secondary | ICD-10-CM | POA: Diagnosis not present

## 2017-09-29 DIAGNOSIS — S92424A Nondisplaced fracture of distal phalanx of right great toe, initial encounter for closed fracture: Secondary | ICD-10-CM | POA: Diagnosis not present

## 2017-09-29 DIAGNOSIS — E119 Type 2 diabetes mellitus without complications: Secondary | ICD-10-CM | POA: Diagnosis not present

## 2017-09-29 DIAGNOSIS — S62521A Displaced fracture of distal phalanx of right thumb, initial encounter for closed fracture: Secondary | ICD-10-CM | POA: Diagnosis not present

## 2017-09-29 DIAGNOSIS — K219 Gastro-esophageal reflux disease without esophagitis: Secondary | ICD-10-CM | POA: Diagnosis not present

## 2017-09-29 DIAGNOSIS — L03031 Cellulitis of right toe: Secondary | ICD-10-CM | POA: Diagnosis not present

## 2017-09-30 DIAGNOSIS — E119 Type 2 diabetes mellitus without complications: Secondary | ICD-10-CM | POA: Diagnosis not present

## 2017-09-30 DIAGNOSIS — S92424A Nondisplaced fracture of distal phalanx of right great toe, initial encounter for closed fracture: Secondary | ICD-10-CM | POA: Diagnosis not present

## 2017-09-30 DIAGNOSIS — W208XXA Other cause of strike by thrown, projected or falling object, initial encounter: Secondary | ICD-10-CM | POA: Diagnosis not present

## 2017-09-30 DIAGNOSIS — G4730 Sleep apnea, unspecified: Secondary | ICD-10-CM | POA: Diagnosis present

## 2017-09-30 DIAGNOSIS — Z79899 Other long term (current) drug therapy: Secondary | ICD-10-CM | POA: Diagnosis not present

## 2017-09-30 DIAGNOSIS — K219 Gastro-esophageal reflux disease without esophagitis: Secondary | ICD-10-CM | POA: Diagnosis present

## 2017-09-30 DIAGNOSIS — E785 Hyperlipidemia, unspecified: Secondary | ICD-10-CM | POA: Diagnosis present

## 2017-09-30 DIAGNOSIS — L03031 Cellulitis of right toe: Secondary | ICD-10-CM | POA: Diagnosis not present

## 2017-09-30 DIAGNOSIS — Z794 Long term (current) use of insulin: Secondary | ICD-10-CM | POA: Diagnosis not present

## 2017-09-30 DIAGNOSIS — G8929 Other chronic pain: Secondary | ICD-10-CM | POA: Diagnosis present

## 2017-09-30 DIAGNOSIS — I2510 Atherosclerotic heart disease of native coronary artery without angina pectoris: Secondary | ICD-10-CM | POA: Diagnosis not present

## 2017-09-30 DIAGNOSIS — F17290 Nicotine dependence, other tobacco product, uncomplicated: Secondary | ICD-10-CM | POA: Diagnosis present

## 2017-09-30 DIAGNOSIS — M549 Dorsalgia, unspecified: Secondary | ICD-10-CM | POA: Diagnosis present

## 2017-09-30 DIAGNOSIS — I10 Essential (primary) hypertension: Secondary | ICD-10-CM | POA: Diagnosis present

## 2017-09-30 DIAGNOSIS — Z6839 Body mass index (BMI) 39.0-39.9, adult: Secondary | ICD-10-CM | POA: Diagnosis not present

## 2017-10-04 ENCOUNTER — Ambulatory Visit (HOSPITAL_COMMUNITY): Payer: Medicare Other | Attending: Cardiology

## 2017-10-04 ENCOUNTER — Other Ambulatory Visit: Payer: Self-pay

## 2017-10-04 ENCOUNTER — Encounter (HOSPITAL_COMMUNITY): Payer: Self-pay

## 2017-10-04 DIAGNOSIS — S91211A Laceration without foreign body of right great toe with damage to nail, initial encounter: Secondary | ICD-10-CM

## 2017-10-04 DIAGNOSIS — S92401A Displaced unspecified fracture of right great toe, initial encounter for closed fracture: Secondary | ICD-10-CM | POA: Diagnosis not present

## 2017-10-04 DIAGNOSIS — R262 Difficulty in walking, not elsewhere classified: Secondary | ICD-10-CM | POA: Insufficient documentation

## 2017-10-04 NOTE — Therapy (Signed)
[facility] [address] [place], [place], [address] Phone: [phone]   Fax:  [phone]  Wound Care Evaluation  Patient Details  Name: [name] MRN: [id number] Date of Birth: Nov 07, 1970 Referring Provider: Toma Deiters, MD   Encounter Date: 10/04/2017  PT End of Session - 10/04/17 1829    Visit Number  1    Number of Visits  19    Date for PT Re-Evaluation  11/15/17   mini re-assess 10/25/17   Authorization Type  Medicare Part A and B (no auth required; no visit limit)    Authorization Time Period  10/04/2017 - 11/15/17    Authorization - Visit Number  1    Authorization - Number of Visits  10    PT Start Time  1646    PT Stop Time  1736    PT Time Calculation (min)  50 min    Activity Tolerance  Patient tolerated treatment well    Behavior During Therapy  [facility] for tasks assessed/performed       Past Medical History:  Diagnosis Date  . Angina pectoris with documented spasm (HCC)   . Corns and callosities   . Diabetes mellitus without complication (HCC)   . Hyperlipidemia   . Hypertension   . Other sleep apnea   . Pancreatitis     Past Surgical History:  Procedure Laterality Date  . APPENDECTOMY  2016  . OTHER SURGICAL HISTORY  2016  . RIGHT/LEFT HEART CATH AND CORONARY ANGIOGRAPHY N/A 08/08/2017   Procedure: RIGHT/LEFT HEART CATH AND CORONARY ANGIOGRAPHY;  Surgeon: [name], MD;  [facility] INVASIVE CV LAB;  Service: Cardiovascular;  Laterality: N/A;    There were no vitals filed for this visit.    [facility] PT Assessment - 10/04/17 0001      Assessment   Medical Diagnosis  Rt Great Toe Laceration    Referring Provider  Toma Deiters, MD    Onset Date/Surgical Date  09/22/17   went to ED on 09/29/17   Next MD Visit  10/10/17    Prior Therapy  self care, in hospital from 09/29/17 - 10/01/17      Precautions   Precautions  None      Restrictions   Weight Bearing Restrictions  No      Balance Screen   Has the patient fallen in the past 6 months  No    Has the patient had a decrease in activity level because of a fear of falling?   No    Is the patient reluctant to leave their home because of a fear of falling?   No      Home Environment   Living Environment  Private residence    Living Arrangements  Alone    Available Help at Discharge  Family;Friend(s)    Home Access  Level entry    Home Layout  One level    Home Equipment  Walker - 2 wheels;Cane - single point;Crutches      Prior Function   Level of Independence  Independent    Vocation  On disability      Cognition   Overall Cognitive Status  Within Functional Limits for tasks assessed      Observation/Other Assessments   Observations  Rt great toe is swollen and purple. Additionally, small scartches/scars are located around patient's lower legs bil, he reports it is from his 3 dogs that like to jump up on him.  Sensation   Light Touch  Impaired by gross assessment   Diabetic neuropathy     Wound Therapy - 10/04/17 1801    Subjective  Patient reports he was moving a washing machine on 09/22/17 and that is dropped on his Rt great toe. He states he did not feel it but when he looked down his toe was bleeding. He states he tried to clean it himself and wrap it with "medihoney" and bandage but that he could not get it to stop bleeding. He finally went to the ED on 09/29/17 and was admitted to the hospital for infection. He states they x-rayed his foot and found his Rt great toe is broken but stated they could not do anything for it due to the infection and wound. He is on antibiotics currently and believes he has 7 more days. He returned home on 10/01/17 and has kept it wrapped.     Patient and Family Stated Goals  wound to heal, keep great toe    Date of Onset  09/22/17    Prior Treatments  self care, care from nursing in acute, antibiotics    Pain Scale  0-10    Pain Score  0-No pain    Evaluation and Treatment  Procedures Explained to Patient/Family  Yes    Evaluation and Treatment Procedures  agreed to    Wound Properties Date First Assessed: 10/04/17 Time First Assessed: 1650 Wound Type: Laceration Location: Toe (Comment  which one) , Great toe  Location Orientation: Right;Anterior Wound Description (Comments): Laceration proximal to nail bed  Present on Admission: Yes   Dressing Type  Alginate;Impregnated gauze (bismuth);Gauze (Comment)   2x2, guaze wrap, coflex   Dressing Changed  Changed    Dressing Status  Old drainage    Dressing Change Frequency  PRN    Site / Wound Assessment  Pale;Yellow;Dusky   purple   % Wound base Red or Granulating  0%    % Wound base Black/Eschar  0%    % Wound base Other/Granulation Tissue (Comment)  100%   pale devitalized tissue   Peri-wound Assessment  Edema;Purple    Wound Length (cm)  0.4 cm    Wound Width (cm)  2.3 cm    Wound Depth (cm)  --   unknown depth, approximating greater than 0.5 cm   Wound Surface Area (cm^2)  0.92 cm^2    Margins  Unattached edges (unapproximated)    Drainage Amount  Moderate    Drainage Description  Serosanguineous    Treatment  Cleansed;Debridement (Selective)    Selective Debridement - Location  Margins aroudn nail and in nail bed of Rt great toe    Selective Debridement - Tools Used  Forceps    Selective Debridement - Tissue Removed  Devitalized tissue and slough     Wound Therapy - Clinical Statement  [name] presents for evaluation/treatment of Rt great toe laceration that occurred ~ 2 weeks ago. The wound spans across the width of his toe proximal to the cuticle and appears to have a depth beyond the ventral floor of the nailbed matrix, however depth was not measureable today. His toenail is raised up and detached from the nail bed with pale/yellow slough underneath the nail. This was easily debrided with forceps and pale devitalized tissue was removes from periwound/margins of toenail. His great toe is swollen and  deep red/purple with a notable purple area superior to the laceration. He was educated to wear his Darco shoe provided at a prior  episode of care for a plantar ulcer on his Rt great toe. He was discharged for that wound ~ 1 month ago and the area appears to be calloused again. He was educated that the [employer] shoe will unweight his Rt great toe to improve blood flow to the wound and promote healing. He will benefit from skilled wound therapy interventions and education on diabetic foot health/care to reduce risk of future wounds and promote improved health and well being.     Wound Therapy - Functional Problem List  impaired sensation     Factors Delaying/Impairing Wound Healing  Altered sensation;Diabetes Mellitus   CHF, CAD, DM2, HTN, HL, hx of smoking   Hydrotherapy Plan  Debridement;Dressing change;Patient/family education;Pulsatile lavage with suction    Wound Therapy - Frequency  3X / week    Wound Therapy - Current Recommendations  PT;Diabetic teaching    Wound Plan  Measure wound 1x/week. Review use of darco shoe to unweight great toe on rigth foot and use of RW to ambulate while wearing shoe for safety. Cleanse, debride, and dress with topical agents to promote healing    Dressing   xeroform to pack, alginate, 2x2 guaze, guaze wrap, coflex        Objective measurements completed on examination: See above findings.     PT Education - 10/04/17 1827    Education Details  Educated to keep dressing dry and remove if it becomes soaked. Edcuated to wear darco shoe to unweight the wound to improve circulation for healing. Educated patient that he need to podiatry referral and that he should discuss this with his PCP on Tuesday at his next appointment.     Person(s) Educated  Patient    Methods  Explanation    Comprehension  Verbalized understanding       PT Short Term Goals - 10/04/17 1833      PT SHORT TERM GOAL #1   Title  Patient will be indepenent with LRAD to mobilize while wearing  darco shoe to unweight great toe of Rt foot for improved wound healing.    Time  1    Period  Weeks    Status  New    Target Date  10/11/17      PT SHORT TERM GOAL #2   Title  Patient will obtain podietry referral for assessment of great toe fracture and assessment of nail for potential removal to improve wound healing.    Time  1    Period  Weeks    Status  New    Target Date  10/11/17        PT Long Term Goals - 10/04/17 1834      PT LONG TERM GOAL #1   Title  Wound will decrease in surface area/volume by 50% or more to show progress towards wound healing.    Time  6    Period  Weeks    Status  New    Target Date  11/15/17      PT LONG TERM GOAL #2   Title  Patient will verbalize safe/appropriate diabetic foot care to prevent future foot wounds.    Time  6    Period  Weeks    Status  New      PT LONG TERM GOAL #3   Title  Patient will obtain diabetic shoe to protect feet from future wounds/injuries.    Time  6    Period  Weeks    Status  New  Plan - 10/04/17 1830    Clinical Impression Statement  see above    Clinical Presentation  Stable    Clinical Decision Making  Low    Rehab Potential  Fair    PT Frequency  3x / week    PT Duration  6 weeks    PT Treatment/Interventions  ADLs/Self Care Home Management;Taping;Manual techniques;Patient/family education;Other (comment);DME Instruction   selective cleansing/debridement to promote wound healing   PT Next Visit Plan  Measure wound 1x/week. Review use of darco shoe to unweight great toe on rigth foot and use of RW to ambulate while wearing shoe for safety. Cleanse, debride, and dress with topical agents to promote healing     Consulted and Agree with Plan of Care  Patient       Patient will benefit from skilled therapeutic intervention in order to improve the following deficits and impairments:  Decreased mobility, Decreased skin integrity, Impaired sensation, Obesity, Decreased safety awareness,  Cardiopulmonary status limiting activity, Other (comment)(infection risk from open wound)  Visit Diagnosis: Laceration of right great toe without foreign body with damage to nail, initial encounter  Closed fracture of phalanx of right great toe, physeal involvement unspecified, unspecified phalanx, initial encounter  Difficulty in walking, not elsewhere classified    Problem List Patient Active Problem List   Diagnosis Date Noted  . Chronic systolic heart failure (HCC)     Valentino Saxon, PT, DPT Physical Therapist with [facility]  10/04/2017 6:36 PM    [facility] [address] [place], [place], [address] Phone: [phone]   Fax:  [phone]  Name: [name] MRN: [id number] Date of Birth: 07/27/1970

## 2017-10-05 ENCOUNTER — Ambulatory Visit (HOSPITAL_COMMUNITY): Payer: Medicare Other | Admitting: Physical Therapy

## 2017-10-05 DIAGNOSIS — S91211A Laceration without foreign body of right great toe with damage to nail, initial encounter: Secondary | ICD-10-CM

## 2017-10-05 DIAGNOSIS — R262 Difficulty in walking, not elsewhere classified: Secondary | ICD-10-CM | POA: Diagnosis not present

## 2017-10-05 DIAGNOSIS — S92401A Displaced unspecified fracture of right great toe, initial encounter for closed fracture: Secondary | ICD-10-CM | POA: Diagnosis not present

## 2017-10-05 NOTE — Therapy (Signed)
[place] [facility] [address] [place], [place], [address] Phone: [phone]   Fax:  [phone]  Wound Care Therapy  Patient Details  Name: [name] MRN: [id number] Date of Birth: [birth year] Referring Provider: Toma Deiters, MD   Encounter Date: 10/05/2017  PT End of Session - 10/05/17 0904    Visit Number  2    Number of Visits  19    Date for PT Re-Evaluation  11/15/17   mini re-assess 10/25/17   Authorization Type  Medicare Part A and B (no auth required; no visit limit)    Authorization Time Period  10/04/2017 - 11/15/17    Authorization - Visit Number  2    Authorization - Number of Visits  10    PT Start Time  0819    PT Stop Time  0848    PT Time Calculation (min)  29 min    Activity Tolerance  Patient tolerated treatment well    Behavior During Therapy  [facility] for tasks assessed/performed       Past Medical History:  Diagnosis Date  . Angina pectoris with documented spasm (HCC)   . Corns and callosities   . Diabetes mellitus without complication (HCC)   . Hyperlipidemia   . Hypertension   . Other sleep apnea   . Pancreatitis     Past Surgical History:  Procedure Laterality Date  . APPENDECTOMY  2016  . OTHER SURGICAL HISTORY  2016  . RIGHT/LEFT HEART CATH AND CORONARY ANGIOGRAPHY N/A 08/08/2017   Procedure: RIGHT/LEFT HEART CATH AND CORONARY ANGIOGRAPHY;  Surgeon: [name], MD;  Location: [facility] INVASIVE CV LAB;  Service: Cardiovascular;  Laterality: N/A;    There were no vitals filed for this visit.              Wound Therapy - 10/05/17 0858    Subjective  Pt returns today wearing his Darco boot.  States it hasn't bled as much as it has.  Pt has no feeling in his foot so doesn't hurt.  Pt asking about seeing a podiatrist.     Patient and Family Stated Goals  wound to heal, keep great toe    Date of Onset  09/22/17    Prior Treatments  self care, care from nursing in acute, antibiotics     Pain Scale  0-10    Pain Score  0-No pain    Evaluation and Treatment Procedures Explained to Patient/Family  Yes    Evaluation and Treatment Procedures  agreed to    Wound Properties Date First Assessed: 10/04/17 Time First Assessed: 1650 Wound Type: Laceration Location: Toe (Comment  which one) , Great toe  Location Orientation: Right;Anterior Wound Description (Comments): Laceration proximal to nail bed  Present on Admission: Yes   Dressing Type  Alginate;Impregnated gauze (bismuth);Gauze (Comment)   2x2, guaze wrap, coflex   Dressing Changed  Changed    Dressing Status  Old drainage    Dressing Change Frequency  PRN    Site / Wound Assessment  Pale;Yellow;Dusky   purple   % Wound base Red or Granulating  0%    % Wound base Black/Eschar  0%    % Wound base Other/Granulation Tissue (Comment)  100%   pale devitalized tissue, unknown under nail bed   Peri-wound Assessment  Edema;Purple    Margins  Unattached edges (unapproximated)    Drainage Amount  Moderate    Drainage Description  Serosanguineous    Treatment  Cleansed;Debridement (  Selective)    Selective Debridement - Location  Margins aroudn nail and in nail bed of Rt great toe    Selective Debridement - Tools Used  Forceps    Selective Debridement - Tissue Removed  Devitalized tissue and slough     Wound Therapy - Clinical Statement  Nail bed completely unattached at base, still attached sides towards end.  Cleansed toe well and debrided slough from visible areas.  Continued with previous dressing and toe wraps to provide compression to toe anchored at dorsal foot. Evaluating therapist to contact MD today about sending patient to podiatrist.  will follow up.    Wound Therapy - Functional Problem List  impaired sensation     Factors Delaying/Impairing Wound Healing  Altered sensation;Diabetes Mellitus   CHF, CAD, DM2, HTN, HL, hx of smoking   Hydrotherapy Plan  Debridement;Dressing change;Patient/family education;Pulsatile lavage  with suction    Wound Therapy - Frequency  3X / week    Wound Therapy - Current Recommendations  PT;Diabetic teaching    Wound Plan  Measure wound 1x/week. Review use of darco shoe to unweight great toe on rigth foot and use of RW to ambulate while wearing shoe for safety. Cleanse, debride, and dress with topical agents to promote healing    Dressing   xeroform to pack, alginate, 2x2 guaze, guaze wrap, coflex                PT Short Term Goals - 10/04/17 1833      PT SHORT TERM GOAL #1   Title  Patient will be indepenent with LRAD to mobilize while wearing darco shoe to unweight great toe of Rt foot for improved wound healing.    Time  1    Period  Weeks    Status  New    Target Date  10/11/17      PT SHORT TERM GOAL #2   Title  Patient will obtain podietry referral for assessment of great toe fracture and assessment of nail for potential removal to improve wound healing.    Time  1    Period  Weeks    Status  New    Target Date  10/11/17        PT Long Term Goals - 10/04/17 1834      PT LONG TERM GOAL #1   Title  Wound will decrease in surface area/volume by 50% or more to show progress towards wound healing.    Time  6    Period  Weeks    Status  New    Target Date  11/15/17      PT LONG TERM GOAL #2   Title  Patient will verbalize safe/appropriate diabetic foot care to prevent future foot wounds.    Time  6    Period  Weeks    Status  New      PT LONG TERM GOAL #3   Title  Patient will obtain diabetic shoe to protect feet from future wounds/injuries.    Time  6    Period  Weeks    Status  New              Patient will benefit from skilled therapeutic intervention in order to improve the following deficits and impairments:     Visit Diagnosis: Laceration of right great toe without foreign body with damage to nail, initial encounter  Closed fracture of phalanx of right great toe, physeal involvement unspecified, unspecified phalanx, initial  encounter  Problem List Patient Active Problem List   Diagnosis Date Noted  . Chronic systolic heart failure [facility])    [name], PTA/CLT [phone]  [name] 10/05/2017, 9:05 AM  [place] [facility] [address] [place], [place], [address] Phone: [phone]   Fax:  [phone]  Name: Mando Blatz [name] MRN: [id number] Date of Birth: [birth year]

## 2017-10-09 ENCOUNTER — Ambulatory Visit (HOSPITAL_COMMUNITY): Payer: Medicare Other | Admitting: Physical Therapy

## 2017-10-09 DIAGNOSIS — S91211A Laceration without foreign body of right great toe with damage to nail, initial encounter: Secondary | ICD-10-CM | POA: Diagnosis not present

## 2017-10-09 DIAGNOSIS — R262 Difficulty in walking, not elsewhere classified: Secondary | ICD-10-CM | POA: Diagnosis not present

## 2017-10-09 DIAGNOSIS — S92401A Displaced unspecified fracture of right great toe, initial encounter for closed fracture: Secondary | ICD-10-CM

## 2017-10-09 NOTE — Therapy (Signed)
[name] [address] [place], [place], [address] Phone: [phone]   Fax:  [phone]  Wound Care Therapy  Patient Details  Name: [name] MRN: [id number] Date of Birth: August 21, 1970 Referring Provider: Toma Deiters, MD   Encounter Date: 10/09/2017  PT End of Session - 10/09/17 1219    Visit Number  33    Number of Visits  19    Date for PT Re-Evaluation  11/15/17   mini re-assess 10/25/17   Authorization Type  Medicare Part A and B (no auth required; no visit limit)    Authorization Time Period  10/04/2017 - 11/15/17    Authorization - Visit Number  2    Authorization - Number of Visits  10    PT Start Time  1120    PT Stop Time  1200    PT Time Calculation (min)  40 min    Activity Tolerance  Patient tolerated treatment well    Behavior During Therapy  [facility] for tasks assessed/performed       Past Medical History:  Diagnosis Date  . Angina pectoris with documented spasm (HCC)   . Corns and callosities   . Diabetes mellitus without complication (HCC)   . Hyperlipidemia   . Hypertension   . Other sleep apnea   . Pancreatitis     Past Surgical History:  Procedure Laterality Date  . APPENDECTOMY  2016  . OTHER SURGICAL HISTORY  2016  . RIGHT/LEFT HEART CATH AND CORONARY ANGIOGRAPHY N/A 08/08/2017   Procedure: RIGHT/LEFT HEART CATH AND CORONARY ANGIOGRAPHY;  Surgeon: [name], MD;  Location: [facility] INVASIVE CV LAB;  Service: Cardiovascular;  Laterality: N/A;    There were no vitals filed for this visit.              Wound Therapy - 10/09/17 1214    Subjective  PT took his last antibiotic yesterday.  Pt states that his MD wanted him to see a surgeon not a podiatrist and his appointment is tomorrow.  He is having periodic sharp pains at his nailbed.     Patient and Family Stated Goals  wound to heal, keep great toe    Date of Onset  09/22/17    Prior Treatments  self care, care from  nursing in acute, antibiotics    Pain Score  0-No pain    Evaluation and Treatment Procedures Explained to Patient/Family  Yes    Evaluation and Treatment Procedures  agreed to    Wound Properties Date First Assessed: 10/04/17 Time First Assessed: 1650 Wound Type: Laceration Location: Toe (Comment  which one) , Great toe  Location Orientation: Right;Anterior Wound Description (Comments): Laceration proximal to nail bed  Present on Admission: Yes   Dressing Type  Alginate;Impregnated gauze (bismuth);Gauze (Comment)   2x2, guaze wrap, coflex   Dressing Changed  Changed    Dressing Status  Old drainage    Dressing Change Frequency  PRN    Site / Wound Assessment  Dusky;Pale   purple   % Wound base Red or Granulating  0%    % Wound base Black/Eschar  0%    % Wound base Other/Granulation Tissue (Comment)  100%   pale devitalized tissue, unknown under nail bed   Peri-wound Assessment  Edema;Maceration    Margins  Unattached edges (unapproximated)    Drainage Amount  Moderate    Drainage Description  Serosanguineous    Treatment  Cleansed;Debridement (Selective)    Selective Debridement -  Location  plantar aspect of toe, Margins around nail and in nail bed of Rt great toe    Selective Debridement - Tools Used  Forceps    Selective Debridement - Tissue Removed  Devitalized tissue and slough     Wound Therapy - Clinical Statement  area distal to nailbed is macerated therefore stopped using xeroform,  Debrided significant amout of callous on the plantar aspect of the toe.      Wound Therapy - Functional Problem List  impaired sensation     Factors Delaying/Impairing Wound Healing  Altered sensation;Diabetes Mellitus   CHF, CAD, DM2, HTN, HL, hx of smoking   Hydrotherapy Plan  Debridement;Dressing change;Patient/family education;Pulsatile lavage with suction    Wound Therapy - Frequency  3X / week    Wound Therapy - Current Recommendations  PT;Diabetic teaching    Wound Plan  Measure weekly,  Fridays.  See about recommendations that the surgeon may have.     Dressing   xeroform to pack, alginate, 2x2 guaze, guaze wrap, coflex                PT Short Term Goals - 10/09/17 1220      PT SHORT TERM GOAL #1   Title  Patient will be indepenent with LRAD to mobilize while wearing darco shoe to unweight great toe of Rt foot for improved wound healing.    Time  1    Period  Weeks    Status  Achieved      PT SHORT TERM GOAL #2   Title  Patient will obtain podietry referral for assessment of great toe fracture and assessment of nail for potential removal to improve wound healing.    Time  1    Period  Weeks    Status  On-going        PT Long Term Goals - 10/09/17 1220      PT LONG TERM GOAL #1   Title  Wound will decrease in surface area/volume by 50% or more to show progress towards wound healing.    Time  6    Period  Weeks    Status  On-going      PT LONG TERM GOAL #2   Title  Patient will verbalize safe/appropriate diabetic foot care to prevent future foot wounds.    Time  6    Period  Weeks    Status  On-going      PT LONG TERM GOAL #3   Title  Patient will obtain diabetic shoe to protect feet from future wounds/injuries.    Time  6    Period  Weeks    Status  On-going            Plan - 10/09/17 1219    Clinical Impression Statement  see above     Rehab Potential  Fair    PT Frequency  3x / week    PT Duration  6 weeks    PT Treatment/Interventions  ADLs/Self Care Home Management;Taping;Manual techniques;Patient/family education;Other (comment);DME Instruction   selective cleansing/debridement to promote wound healing   PT Next Visit Plan  Measure wound 1x/week. Inquire on surgeons recommendation. Cleanse, debride, and dress with topical agents to promote healing     Consulted and Agree with Plan of Care  Patient       Patient will benefit from skilled therapeutic intervention in order to improve the following deficits and impairments:   Decreased mobility, Decreased skin integrity, Impaired sensation, Obesity, Decreased safety awareness,  Cardiopulmonary status limiting activity, Other (comment)(infection risk from open wound)  Visit Diagnosis: Laceration of right great toe without foreign body with damage to nail, initial encounter  Difficulty in walking, not elsewhere classified  Closed fracture of phalanx of right great toe, physeal involvement unspecified, unspecified phalanx, initial encounter     Problem List Patient Active Problem List   Diagnosis Date Noted  . Chronic systolic heart failure [facility](HCC)     Virgina OrganCynthia Memphis Creswell, PT CLT [phone] 10/09/2017, 12:21 PM  [facility] [address] [place], [place], [address] Phone: [phone]   Fax:  [phone]  Name: Belenda CruiseRandolph Reynolds [name] MRN: [id number] Date of Birth: December 24, 1970

## 2017-10-10 ENCOUNTER — Telehealth (HOSPITAL_COMMUNITY): Payer: Self-pay

## 2017-10-10 DIAGNOSIS — Z1389 Encounter for screening for other disorder: Secondary | ICD-10-CM | POA: Diagnosis not present

## 2017-10-10 DIAGNOSIS — E1143 Type 2 diabetes mellitus with diabetic autonomic (poly)neuropathy: Secondary | ICD-10-CM | POA: Diagnosis not present

## 2017-10-10 DIAGNOSIS — L03031 Cellulitis of right toe: Secondary | ICD-10-CM | POA: Diagnosis not present

## 2017-10-10 DIAGNOSIS — Z0000 Encounter for general adult medical examination without abnormal findings: Secondary | ICD-10-CM | POA: Diagnosis not present

## 2017-10-10 NOTE — Telephone Encounter (Signed)
Pt went to MD had the toe nail cut off and the MD will care for his wound for now. Please D/C today per Patient

## 2017-10-11 ENCOUNTER — Ambulatory Visit (HOSPITAL_COMMUNITY): Payer: Medicare Other

## 2017-10-12 ENCOUNTER — Ambulatory Visit (HOSPITAL_COMMUNITY): Payer: Medicare Other

## 2017-10-17 ENCOUNTER — Ambulatory Visit (HOSPITAL_COMMUNITY): Payer: Medicare Other

## 2017-10-17 DIAGNOSIS — L02818 Cutaneous abscess of other sites: Secondary | ICD-10-CM | POA: Diagnosis not present

## 2017-10-18 ENCOUNTER — Ambulatory Visit (HOSPITAL_COMMUNITY): Payer: Medicare Other

## 2017-10-20 ENCOUNTER — Ambulatory Visit (HOSPITAL_COMMUNITY): Payer: Medicare Other

## 2017-10-20 DIAGNOSIS — E11621 Type 2 diabetes mellitus with foot ulcer: Secondary | ICD-10-CM | POA: Diagnosis not present

## 2017-10-20 DIAGNOSIS — Z79899 Other long term (current) drug therapy: Secondary | ICD-10-CM | POA: Diagnosis not present

## 2017-10-20 DIAGNOSIS — M868X7 Other osteomyelitis, ankle and foot: Secondary | ICD-10-CM | POA: Diagnosis not present

## 2017-10-20 DIAGNOSIS — S90851A Superficial foreign body, right foot, initial encounter: Secondary | ICD-10-CM | POA: Diagnosis not present

## 2017-10-20 DIAGNOSIS — M7989 Other specified soft tissue disorders: Secondary | ICD-10-CM | POA: Diagnosis not present

## 2017-10-20 DIAGNOSIS — K219 Gastro-esophageal reflux disease without esophagitis: Secondary | ICD-10-CM | POA: Diagnosis not present

## 2017-10-20 DIAGNOSIS — L03115 Cellulitis of right lower limb: Secondary | ICD-10-CM | POA: Diagnosis not present

## 2017-10-20 DIAGNOSIS — Z8249 Family history of ischemic heart disease and other diseases of the circulatory system: Secondary | ICD-10-CM | POA: Diagnosis not present

## 2017-10-20 DIAGNOSIS — E1169 Type 2 diabetes mellitus with other specified complication: Secondary | ICD-10-CM | POA: Diagnosis not present

## 2017-10-20 DIAGNOSIS — R112 Nausea with vomiting, unspecified: Secondary | ICD-10-CM | POA: Diagnosis not present

## 2017-10-20 DIAGNOSIS — M869 Osteomyelitis, unspecified: Secondary | ICD-10-CM | POA: Diagnosis not present

## 2017-10-20 DIAGNOSIS — I739 Peripheral vascular disease, unspecified: Secondary | ICD-10-CM | POA: Diagnosis not present

## 2017-10-20 DIAGNOSIS — I2510 Atherosclerotic heart disease of native coronary artery without angina pectoris: Secondary | ICD-10-CM | POA: Diagnosis not present

## 2017-10-20 DIAGNOSIS — B964 Proteus (mirabilis) (morganii) as the cause of diseases classified elsewhere: Secondary | ICD-10-CM | POA: Diagnosis not present

## 2017-10-20 DIAGNOSIS — R531 Weakness: Secondary | ICD-10-CM | POA: Diagnosis not present

## 2017-10-20 DIAGNOSIS — B9562 Methicillin resistant Staphylococcus aureus infection as the cause of diseases classified elsewhere: Secondary | ICD-10-CM | POA: Diagnosis not present

## 2017-10-20 DIAGNOSIS — Z794 Long term (current) use of insulin: Secondary | ICD-10-CM | POA: Diagnosis not present

## 2017-10-20 DIAGNOSIS — L97514 Non-pressure chronic ulcer of other part of right foot with necrosis of bone: Secondary | ICD-10-CM | POA: Diagnosis not present

## 2017-10-20 DIAGNOSIS — Z833 Family history of diabetes mellitus: Secondary | ICD-10-CM | POA: Diagnosis not present

## 2017-10-20 DIAGNOSIS — E1165 Type 2 diabetes mellitus with hyperglycemia: Secondary | ICD-10-CM | POA: Diagnosis not present

## 2017-10-20 DIAGNOSIS — M86171 Other acute osteomyelitis, right ankle and foot: Secondary | ICD-10-CM | POA: Diagnosis not present

## 2017-10-20 DIAGNOSIS — Z6838 Body mass index (BMI) 38.0-38.9, adult: Secondary | ICD-10-CM | POA: Diagnosis not present

## 2017-10-20 DIAGNOSIS — L97519 Non-pressure chronic ulcer of other part of right foot with unspecified severity: Secondary | ICD-10-CM | POA: Diagnosis present

## 2017-10-20 DIAGNOSIS — I10 Essential (primary) hypertension: Secondary | ICD-10-CM | POA: Diagnosis present

## 2017-10-20 DIAGNOSIS — E785 Hyperlipidemia, unspecified: Secondary | ICD-10-CM | POA: Diagnosis present

## 2017-10-20 DIAGNOSIS — E1140 Type 2 diabetes mellitus with diabetic neuropathy, unspecified: Secondary | ICD-10-CM | POA: Diagnosis present

## 2017-10-20 DIAGNOSIS — L03031 Cellulitis of right toe: Secondary | ICD-10-CM | POA: Diagnosis present

## 2017-10-20 DIAGNOSIS — Z23 Encounter for immunization: Secondary | ICD-10-CM | POA: Diagnosis not present

## 2017-10-23 ENCOUNTER — Ambulatory Visit (HOSPITAL_COMMUNITY): Payer: Medicare Other

## 2017-10-25 ENCOUNTER — Ambulatory Visit (HOSPITAL_COMMUNITY): Payer: Medicare Other

## 2017-10-26 ENCOUNTER — Other Ambulatory Visit: Payer: Self-pay | Admitting: *Deleted

## 2017-10-26 DIAGNOSIS — G4730 Sleep apnea, unspecified: Secondary | ICD-10-CM | POA: Diagnosis not present

## 2017-10-26 DIAGNOSIS — Z792 Long term (current) use of antibiotics: Secondary | ICD-10-CM | POA: Diagnosis not present

## 2017-10-26 DIAGNOSIS — E1165 Type 2 diabetes mellitus with hyperglycemia: Secondary | ICD-10-CM | POA: Diagnosis not present

## 2017-10-26 DIAGNOSIS — I10 Essential (primary) hypertension: Secondary | ICD-10-CM | POA: Diagnosis not present

## 2017-10-26 DIAGNOSIS — Z7982 Long term (current) use of aspirin: Secondary | ICD-10-CM | POA: Diagnosis not present

## 2017-10-26 DIAGNOSIS — Z6838 Body mass index (BMI) 38.0-38.9, adult: Secondary | ICD-10-CM | POA: Diagnosis not present

## 2017-10-26 DIAGNOSIS — E1169 Type 2 diabetes mellitus with other specified complication: Secondary | ICD-10-CM | POA: Diagnosis not present

## 2017-10-26 DIAGNOSIS — M86171 Other acute osteomyelitis, right ankle and foot: Secondary | ICD-10-CM | POA: Diagnosis not present

## 2017-10-26 DIAGNOSIS — Z4801 Encounter for change or removal of surgical wound dressing: Secondary | ICD-10-CM | POA: Diagnosis not present

## 2017-10-26 DIAGNOSIS — Z794 Long term (current) use of insulin: Secondary | ICD-10-CM | POA: Diagnosis not present

## 2017-10-26 DIAGNOSIS — B9562 Methicillin resistant Staphylococcus aureus infection as the cause of diseases classified elsewhere: Secondary | ICD-10-CM | POA: Diagnosis not present

## 2017-10-26 DIAGNOSIS — I2510 Atherosclerotic heart disease of native coronary artery without angina pectoris: Secondary | ICD-10-CM | POA: Diagnosis not present

## 2017-10-26 DIAGNOSIS — Z4781 Encounter for orthopedic aftercare following surgical amputation: Secondary | ICD-10-CM | POA: Diagnosis not present

## 2017-10-26 DIAGNOSIS — Z89411 Acquired absence of right great toe: Secondary | ICD-10-CM | POA: Diagnosis not present

## 2017-10-26 MED ORDER — LISINOPRIL 20 MG PO TABS: 20.0000 mg | ORAL_TABLET | Freq: Every day | ORAL | 0 refills | Status: DC

## 2017-10-26 MED ORDER — METOPROLOL SUCCINATE ER 25 MG PO TB24: 25.0000 mg | ORAL_TABLET | Freq: Every day | ORAL | 0 refills | Status: DC

## 2017-10-30 DIAGNOSIS — Z89411 Acquired absence of right great toe: Secondary | ICD-10-CM | POA: Diagnosis not present

## 2017-10-30 DIAGNOSIS — Z4781 Encounter for orthopedic aftercare following surgical amputation: Secondary | ICD-10-CM | POA: Diagnosis not present

## 2017-10-30 DIAGNOSIS — E1169 Type 2 diabetes mellitus with other specified complication: Secondary | ICD-10-CM | POA: Diagnosis not present

## 2017-10-30 DIAGNOSIS — B9562 Methicillin resistant Staphylococcus aureus infection as the cause of diseases classified elsewhere: Secondary | ICD-10-CM | POA: Diagnosis not present

## 2017-10-30 DIAGNOSIS — M86171 Other acute osteomyelitis, right ankle and foot: Secondary | ICD-10-CM | POA: Diagnosis not present

## 2017-10-30 DIAGNOSIS — E1165 Type 2 diabetes mellitus with hyperglycemia: Secondary | ICD-10-CM | POA: Diagnosis not present

## 2017-10-31 DIAGNOSIS — L97514 Non-pressure chronic ulcer of other part of right foot with necrosis of bone: Secondary | ICD-10-CM | POA: Diagnosis not present

## 2017-10-31 DIAGNOSIS — L97518 Non-pressure chronic ulcer of other part of right foot with other specified severity: Secondary | ICD-10-CM | POA: Diagnosis not present

## 2017-10-31 DIAGNOSIS — M868X7 Other osteomyelitis, ankle and foot: Secondary | ICD-10-CM | POA: Diagnosis not present

## 2017-10-31 DIAGNOSIS — E1169 Type 2 diabetes mellitus with other specified complication: Secondary | ICD-10-CM | POA: Diagnosis not present

## 2017-10-31 DIAGNOSIS — E11621 Type 2 diabetes mellitus with foot ulcer: Secondary | ICD-10-CM | POA: Diagnosis not present

## 2017-11-02 DIAGNOSIS — Z89411 Acquired absence of right great toe: Secondary | ICD-10-CM | POA: Diagnosis not present

## 2017-11-02 DIAGNOSIS — E11610 Type 2 diabetes mellitus with diabetic neuropathic arthropathy: Secondary | ICD-10-CM | POA: Diagnosis not present

## 2017-11-03 ENCOUNTER — Ambulatory Visit (HOSPITAL_COMMUNITY): Payer: Medicare Other

## 2017-11-03 DIAGNOSIS — Z89411 Acquired absence of right great toe: Secondary | ICD-10-CM | POA: Diagnosis not present

## 2017-11-03 DIAGNOSIS — B9562 Methicillin resistant Staphylococcus aureus infection as the cause of diseases classified elsewhere: Secondary | ICD-10-CM | POA: Diagnosis not present

## 2017-11-03 DIAGNOSIS — M86171 Other acute osteomyelitis, right ankle and foot: Secondary | ICD-10-CM | POA: Diagnosis not present

## 2017-11-03 DIAGNOSIS — E1165 Type 2 diabetes mellitus with hyperglycemia: Secondary | ICD-10-CM | POA: Diagnosis not present

## 2017-11-03 DIAGNOSIS — E1169 Type 2 diabetes mellitus with other specified complication: Secondary | ICD-10-CM | POA: Diagnosis not present

## 2017-11-03 DIAGNOSIS — Z4781 Encounter for orthopedic aftercare following surgical amputation: Secondary | ICD-10-CM | POA: Diagnosis not present

## 2017-11-06 ENCOUNTER — Ambulatory Visit (HOSPITAL_COMMUNITY): Payer: Medicare Other

## 2017-11-06 DIAGNOSIS — M86171 Other acute osteomyelitis, right ankle and foot: Secondary | ICD-10-CM | POA: Diagnosis not present

## 2017-11-06 DIAGNOSIS — Z4781 Encounter for orthopedic aftercare following surgical amputation: Secondary | ICD-10-CM | POA: Diagnosis not present

## 2017-11-06 DIAGNOSIS — E1165 Type 2 diabetes mellitus with hyperglycemia: Secondary | ICD-10-CM | POA: Diagnosis not present

## 2017-11-06 DIAGNOSIS — B9562 Methicillin resistant Staphylococcus aureus infection as the cause of diseases classified elsewhere: Secondary | ICD-10-CM | POA: Diagnosis not present

## 2017-11-06 DIAGNOSIS — E1169 Type 2 diabetes mellitus with other specified complication: Secondary | ICD-10-CM | POA: Diagnosis not present

## 2017-11-06 DIAGNOSIS — Z89411 Acquired absence of right great toe: Secondary | ICD-10-CM | POA: Diagnosis not present

## 2017-11-07 DIAGNOSIS — L97518 Non-pressure chronic ulcer of other part of right foot with other specified severity: Secondary | ICD-10-CM | POA: Diagnosis not present

## 2017-11-07 DIAGNOSIS — M868X7 Other osteomyelitis, ankle and foot: Secondary | ICD-10-CM | POA: Diagnosis not present

## 2017-11-07 DIAGNOSIS — E11621 Type 2 diabetes mellitus with foot ulcer: Secondary | ICD-10-CM | POA: Diagnosis not present

## 2017-11-07 DIAGNOSIS — E1169 Type 2 diabetes mellitus with other specified complication: Secondary | ICD-10-CM | POA: Diagnosis not present

## 2017-11-08 ENCOUNTER — Ambulatory Visit (HOSPITAL_COMMUNITY): Payer: Medicare Other

## 2017-11-09 DIAGNOSIS — E1165 Type 2 diabetes mellitus with hyperglycemia: Secondary | ICD-10-CM | POA: Diagnosis not present

## 2017-11-09 DIAGNOSIS — B9562 Methicillin resistant Staphylococcus aureus infection as the cause of diseases classified elsewhere: Secondary | ICD-10-CM | POA: Diagnosis not present

## 2017-11-09 DIAGNOSIS — M86171 Other acute osteomyelitis, right ankle and foot: Secondary | ICD-10-CM | POA: Diagnosis not present

## 2017-11-09 DIAGNOSIS — E1169 Type 2 diabetes mellitus with other specified complication: Secondary | ICD-10-CM | POA: Diagnosis not present

## 2017-11-09 DIAGNOSIS — Z89411 Acquired absence of right great toe: Secondary | ICD-10-CM | POA: Diagnosis not present

## 2017-11-09 DIAGNOSIS — Z4781 Encounter for orthopedic aftercare following surgical amputation: Secondary | ICD-10-CM | POA: Diagnosis not present

## 2017-11-10 ENCOUNTER — Ambulatory Visit (HOSPITAL_COMMUNITY): Payer: Medicare Other

## 2017-11-13 ENCOUNTER — Ambulatory Visit (HOSPITAL_COMMUNITY): Payer: Medicare Other

## 2017-11-13 DIAGNOSIS — Z4781 Encounter for orthopedic aftercare following surgical amputation: Secondary | ICD-10-CM | POA: Diagnosis not present

## 2017-11-13 DIAGNOSIS — M86171 Other acute osteomyelitis, right ankle and foot: Secondary | ICD-10-CM | POA: Diagnosis not present

## 2017-11-13 DIAGNOSIS — Z89411 Acquired absence of right great toe: Secondary | ICD-10-CM | POA: Diagnosis not present

## 2017-11-13 DIAGNOSIS — E1165 Type 2 diabetes mellitus with hyperglycemia: Secondary | ICD-10-CM | POA: Diagnosis not present

## 2017-11-13 DIAGNOSIS — B9562 Methicillin resistant Staphylococcus aureus infection as the cause of diseases classified elsewhere: Secondary | ICD-10-CM | POA: Diagnosis not present

## 2017-11-13 DIAGNOSIS — E1169 Type 2 diabetes mellitus with other specified complication: Secondary | ICD-10-CM | POA: Diagnosis not present

## 2017-11-14 DIAGNOSIS — E11621 Type 2 diabetes mellitus with foot ulcer: Secondary | ICD-10-CM | POA: Diagnosis not present

## 2017-11-14 DIAGNOSIS — M869 Osteomyelitis, unspecified: Secondary | ICD-10-CM | POA: Diagnosis not present

## 2017-11-14 DIAGNOSIS — L97519 Non-pressure chronic ulcer of other part of right foot with unspecified severity: Secondary | ICD-10-CM | POA: Diagnosis not present

## 2017-11-14 DIAGNOSIS — L97518 Non-pressure chronic ulcer of other part of right foot with other specified severity: Secondary | ICD-10-CM | POA: Diagnosis not present

## 2017-11-14 DIAGNOSIS — M868X7 Other osteomyelitis, ankle and foot: Secondary | ICD-10-CM | POA: Diagnosis not present

## 2017-11-14 DIAGNOSIS — E1169 Type 2 diabetes mellitus with other specified complication: Secondary | ICD-10-CM | POA: Diagnosis not present

## 2017-11-15 ENCOUNTER — Ambulatory Visit (HOSPITAL_COMMUNITY): Payer: Medicare Other

## 2017-11-17 ENCOUNTER — Ambulatory Visit (HOSPITAL_COMMUNITY): Payer: Medicare Other

## 2017-11-17 DIAGNOSIS — B9562 Methicillin resistant Staphylococcus aureus infection as the cause of diseases classified elsewhere: Secondary | ICD-10-CM | POA: Diagnosis not present

## 2017-11-17 DIAGNOSIS — Z89411 Acquired absence of right great toe: Secondary | ICD-10-CM | POA: Diagnosis not present

## 2017-11-17 DIAGNOSIS — E1165 Type 2 diabetes mellitus with hyperglycemia: Secondary | ICD-10-CM | POA: Diagnosis not present

## 2017-11-17 DIAGNOSIS — M86171 Other acute osteomyelitis, right ankle and foot: Secondary | ICD-10-CM | POA: Diagnosis not present

## 2017-11-17 DIAGNOSIS — E1169 Type 2 diabetes mellitus with other specified complication: Secondary | ICD-10-CM | POA: Diagnosis not present

## 2017-11-17 DIAGNOSIS — Z4781 Encounter for orthopedic aftercare following surgical amputation: Secondary | ICD-10-CM | POA: Diagnosis not present

## 2017-11-20 DIAGNOSIS — M86171 Other acute osteomyelitis, right ankle and foot: Secondary | ICD-10-CM | POA: Diagnosis not present

## 2017-11-20 DIAGNOSIS — B9562 Methicillin resistant Staphylococcus aureus infection as the cause of diseases classified elsewhere: Secondary | ICD-10-CM | POA: Diagnosis not present

## 2017-11-20 DIAGNOSIS — E1165 Type 2 diabetes mellitus with hyperglycemia: Secondary | ICD-10-CM | POA: Diagnosis not present

## 2017-11-20 DIAGNOSIS — E1169 Type 2 diabetes mellitus with other specified complication: Secondary | ICD-10-CM | POA: Diagnosis not present

## 2017-11-20 DIAGNOSIS — Z89411 Acquired absence of right great toe: Secondary | ICD-10-CM | POA: Diagnosis not present

## 2017-11-20 DIAGNOSIS — Z4781 Encounter for orthopedic aftercare following surgical amputation: Secondary | ICD-10-CM | POA: Diagnosis not present

## 2017-11-22 DIAGNOSIS — B9562 Methicillin resistant Staphylococcus aureus infection as the cause of diseases classified elsewhere: Secondary | ICD-10-CM | POA: Diagnosis not present

## 2017-11-22 DIAGNOSIS — M86171 Other acute osteomyelitis, right ankle and foot: Secondary | ICD-10-CM | POA: Diagnosis not present

## 2017-11-22 DIAGNOSIS — E1165 Type 2 diabetes mellitus with hyperglycemia: Secondary | ICD-10-CM | POA: Diagnosis not present

## 2017-11-22 DIAGNOSIS — Z4781 Encounter for orthopedic aftercare following surgical amputation: Secondary | ICD-10-CM | POA: Diagnosis not present

## 2017-11-22 DIAGNOSIS — Z89411 Acquired absence of right great toe: Secondary | ICD-10-CM | POA: Diagnosis not present

## 2017-11-22 DIAGNOSIS — E1169 Type 2 diabetes mellitus with other specified complication: Secondary | ICD-10-CM | POA: Diagnosis not present

## 2017-11-23 DIAGNOSIS — Z89411 Acquired absence of right great toe: Secondary | ICD-10-CM | POA: Diagnosis not present

## 2017-11-23 DIAGNOSIS — I2584 Coronary atherosclerosis due to calcified coronary lesion: Secondary | ICD-10-CM | POA: Diagnosis not present

## 2017-11-23 DIAGNOSIS — E7849 Other hyperlipidemia: Secondary | ICD-10-CM | POA: Diagnosis not present

## 2017-11-23 DIAGNOSIS — E11610 Type 2 diabetes mellitus with diabetic neuropathic arthropathy: Secondary | ICD-10-CM | POA: Diagnosis not present

## 2017-11-23 DIAGNOSIS — I10 Essential (primary) hypertension: Secondary | ICD-10-CM | POA: Diagnosis not present

## 2017-11-24 DIAGNOSIS — B9562 Methicillin resistant Staphylococcus aureus infection as the cause of diseases classified elsewhere: Secondary | ICD-10-CM | POA: Diagnosis not present

## 2017-11-24 DIAGNOSIS — M86171 Other acute osteomyelitis, right ankle and foot: Secondary | ICD-10-CM | POA: Diagnosis not present

## 2017-11-24 DIAGNOSIS — Z89411 Acquired absence of right great toe: Secondary | ICD-10-CM | POA: Diagnosis not present

## 2017-11-24 DIAGNOSIS — E1165 Type 2 diabetes mellitus with hyperglycemia: Secondary | ICD-10-CM | POA: Diagnosis not present

## 2017-11-24 DIAGNOSIS — E1169 Type 2 diabetes mellitus with other specified complication: Secondary | ICD-10-CM | POA: Diagnosis not present

## 2017-11-24 DIAGNOSIS — Z4781 Encounter for orthopedic aftercare following surgical amputation: Secondary | ICD-10-CM | POA: Diagnosis not present

## 2017-11-27 DIAGNOSIS — B9562 Methicillin resistant Staphylococcus aureus infection as the cause of diseases classified elsewhere: Secondary | ICD-10-CM | POA: Diagnosis not present

## 2017-11-27 DIAGNOSIS — E1165 Type 2 diabetes mellitus with hyperglycemia: Secondary | ICD-10-CM | POA: Diagnosis not present

## 2017-11-27 DIAGNOSIS — Z4781 Encounter for orthopedic aftercare following surgical amputation: Secondary | ICD-10-CM | POA: Diagnosis not present

## 2017-11-27 DIAGNOSIS — E1169 Type 2 diabetes mellitus with other specified complication: Secondary | ICD-10-CM | POA: Diagnosis not present

## 2017-11-27 DIAGNOSIS — Z89411 Acquired absence of right great toe: Secondary | ICD-10-CM | POA: Diagnosis not present

## 2017-11-27 DIAGNOSIS — M86171 Other acute osteomyelitis, right ankle and foot: Secondary | ICD-10-CM | POA: Diagnosis not present

## 2017-11-28 DIAGNOSIS — E11621 Type 2 diabetes mellitus with foot ulcer: Secondary | ICD-10-CM | POA: Diagnosis not present

## 2017-11-28 DIAGNOSIS — M869 Osteomyelitis, unspecified: Secondary | ICD-10-CM | POA: Diagnosis not present

## 2017-11-28 DIAGNOSIS — E1169 Type 2 diabetes mellitus with other specified complication: Secondary | ICD-10-CM | POA: Diagnosis not present

## 2017-11-28 DIAGNOSIS — L97518 Non-pressure chronic ulcer of other part of right foot with other specified severity: Secondary | ICD-10-CM | POA: Diagnosis not present

## 2017-11-28 DIAGNOSIS — L97514 Non-pressure chronic ulcer of other part of right foot with necrosis of bone: Secondary | ICD-10-CM | POA: Diagnosis not present

## 2017-12-01 DIAGNOSIS — E1165 Type 2 diabetes mellitus with hyperglycemia: Secondary | ICD-10-CM | POA: Diagnosis not present

## 2017-12-01 DIAGNOSIS — Z89411 Acquired absence of right great toe: Secondary | ICD-10-CM | POA: Diagnosis not present

## 2017-12-01 DIAGNOSIS — Z4781 Encounter for orthopedic aftercare following surgical amputation: Secondary | ICD-10-CM | POA: Diagnosis not present

## 2017-12-01 DIAGNOSIS — B9562 Methicillin resistant Staphylococcus aureus infection as the cause of diseases classified elsewhere: Secondary | ICD-10-CM | POA: Diagnosis not present

## 2017-12-01 DIAGNOSIS — E1169 Type 2 diabetes mellitus with other specified complication: Secondary | ICD-10-CM | POA: Diagnosis not present

## 2017-12-01 DIAGNOSIS — M86171 Other acute osteomyelitis, right ankle and foot: Secondary | ICD-10-CM | POA: Diagnosis not present

## 2017-12-04 DIAGNOSIS — E1169 Type 2 diabetes mellitus with other specified complication: Secondary | ICD-10-CM | POA: Diagnosis not present

## 2017-12-04 DIAGNOSIS — Z4781 Encounter for orthopedic aftercare following surgical amputation: Secondary | ICD-10-CM | POA: Diagnosis not present

## 2017-12-04 DIAGNOSIS — M86171 Other acute osteomyelitis, right ankle and foot: Secondary | ICD-10-CM | POA: Diagnosis not present

## 2017-12-04 DIAGNOSIS — B9562 Methicillin resistant Staphylococcus aureus infection as the cause of diseases classified elsewhere: Secondary | ICD-10-CM | POA: Diagnosis not present

## 2017-12-04 DIAGNOSIS — E1165 Type 2 diabetes mellitus with hyperglycemia: Secondary | ICD-10-CM | POA: Diagnosis not present

## 2017-12-04 DIAGNOSIS — Z89411 Acquired absence of right great toe: Secondary | ICD-10-CM | POA: Diagnosis not present

## 2017-12-05 DIAGNOSIS — I10 Essential (primary) hypertension: Secondary | ICD-10-CM | POA: Diagnosis not present

## 2017-12-05 DIAGNOSIS — E7849 Other hyperlipidemia: Secondary | ICD-10-CM | POA: Diagnosis not present

## 2017-12-05 DIAGNOSIS — E11610 Type 2 diabetes mellitus with diabetic neuropathic arthropathy: Secondary | ICD-10-CM | POA: Diagnosis not present

## 2017-12-06 DIAGNOSIS — M86171 Other acute osteomyelitis, right ankle and foot: Secondary | ICD-10-CM | POA: Diagnosis not present

## 2017-12-06 DIAGNOSIS — E1165 Type 2 diabetes mellitus with hyperglycemia: Secondary | ICD-10-CM | POA: Diagnosis not present

## 2017-12-06 DIAGNOSIS — E1169 Type 2 diabetes mellitus with other specified complication: Secondary | ICD-10-CM | POA: Diagnosis not present

## 2017-12-06 DIAGNOSIS — B9562 Methicillin resistant Staphylococcus aureus infection as the cause of diseases classified elsewhere: Secondary | ICD-10-CM | POA: Diagnosis not present

## 2017-12-06 DIAGNOSIS — Z89411 Acquired absence of right great toe: Secondary | ICD-10-CM | POA: Diagnosis not present

## 2017-12-06 DIAGNOSIS — Z4781 Encounter for orthopedic aftercare following surgical amputation: Secondary | ICD-10-CM | POA: Diagnosis not present

## 2017-12-08 DIAGNOSIS — Z4781 Encounter for orthopedic aftercare following surgical amputation: Secondary | ICD-10-CM | POA: Diagnosis not present

## 2017-12-08 DIAGNOSIS — B9562 Methicillin resistant Staphylococcus aureus infection as the cause of diseases classified elsewhere: Secondary | ICD-10-CM | POA: Diagnosis not present

## 2017-12-08 DIAGNOSIS — E1169 Type 2 diabetes mellitus with other specified complication: Secondary | ICD-10-CM | POA: Diagnosis not present

## 2017-12-08 DIAGNOSIS — Z89411 Acquired absence of right great toe: Secondary | ICD-10-CM | POA: Diagnosis not present

## 2017-12-08 DIAGNOSIS — E1165 Type 2 diabetes mellitus with hyperglycemia: Secondary | ICD-10-CM | POA: Diagnosis not present

## 2017-12-08 DIAGNOSIS — M86171 Other acute osteomyelitis, right ankle and foot: Secondary | ICD-10-CM | POA: Diagnosis not present

## 2017-12-11 DIAGNOSIS — Z4781 Encounter for orthopedic aftercare following surgical amputation: Secondary | ICD-10-CM | POA: Diagnosis not present

## 2017-12-11 DIAGNOSIS — M86171 Other acute osteomyelitis, right ankle and foot: Secondary | ICD-10-CM | POA: Diagnosis not present

## 2017-12-11 DIAGNOSIS — Z89411 Acquired absence of right great toe: Secondary | ICD-10-CM | POA: Diagnosis not present

## 2017-12-11 DIAGNOSIS — B9562 Methicillin resistant Staphylococcus aureus infection as the cause of diseases classified elsewhere: Secondary | ICD-10-CM | POA: Diagnosis not present

## 2017-12-11 DIAGNOSIS — E1169 Type 2 diabetes mellitus with other specified complication: Secondary | ICD-10-CM | POA: Diagnosis not present

## 2017-12-11 DIAGNOSIS — E1165 Type 2 diabetes mellitus with hyperglycemia: Secondary | ICD-10-CM | POA: Diagnosis not present

## 2017-12-12 DIAGNOSIS — L97518 Non-pressure chronic ulcer of other part of right foot with other specified severity: Secondary | ICD-10-CM | POA: Diagnosis not present

## 2017-12-12 DIAGNOSIS — M869 Osteomyelitis, unspecified: Secondary | ICD-10-CM | POA: Diagnosis not present

## 2017-12-12 DIAGNOSIS — E1169 Type 2 diabetes mellitus with other specified complication: Secondary | ICD-10-CM | POA: Diagnosis not present

## 2017-12-12 DIAGNOSIS — E11621 Type 2 diabetes mellitus with foot ulcer: Secondary | ICD-10-CM | POA: Diagnosis not present

## 2017-12-15 DIAGNOSIS — E1169 Type 2 diabetes mellitus with other specified complication: Secondary | ICD-10-CM | POA: Diagnosis not present

## 2017-12-15 DIAGNOSIS — M86171 Other acute osteomyelitis, right ankle and foot: Secondary | ICD-10-CM | POA: Diagnosis not present

## 2017-12-15 DIAGNOSIS — B9562 Methicillin resistant Staphylococcus aureus infection as the cause of diseases classified elsewhere: Secondary | ICD-10-CM | POA: Diagnosis not present

## 2017-12-15 DIAGNOSIS — Z89411 Acquired absence of right great toe: Secondary | ICD-10-CM | POA: Diagnosis not present

## 2017-12-15 DIAGNOSIS — Z4781 Encounter for orthopedic aftercare following surgical amputation: Secondary | ICD-10-CM | POA: Diagnosis not present

## 2017-12-15 DIAGNOSIS — E1165 Type 2 diabetes mellitus with hyperglycemia: Secondary | ICD-10-CM | POA: Diagnosis not present

## 2017-12-18 DIAGNOSIS — Z4781 Encounter for orthopedic aftercare following surgical amputation: Secondary | ICD-10-CM | POA: Diagnosis not present

## 2017-12-18 DIAGNOSIS — E1169 Type 2 diabetes mellitus with other specified complication: Secondary | ICD-10-CM | POA: Diagnosis not present

## 2017-12-18 DIAGNOSIS — B9562 Methicillin resistant Staphylococcus aureus infection as the cause of diseases classified elsewhere: Secondary | ICD-10-CM | POA: Diagnosis not present

## 2017-12-18 DIAGNOSIS — Z89411 Acquired absence of right great toe: Secondary | ICD-10-CM | POA: Diagnosis not present

## 2017-12-18 DIAGNOSIS — M86171 Other acute osteomyelitis, right ankle and foot: Secondary | ICD-10-CM | POA: Diagnosis not present

## 2017-12-18 DIAGNOSIS — E1165 Type 2 diabetes mellitus with hyperglycemia: Secondary | ICD-10-CM | POA: Diagnosis not present

## 2017-12-20 DIAGNOSIS — Z4781 Encounter for orthopedic aftercare following surgical amputation: Secondary | ICD-10-CM | POA: Diagnosis not present

## 2017-12-20 DIAGNOSIS — Z89411 Acquired absence of right great toe: Secondary | ICD-10-CM | POA: Diagnosis not present

## 2017-12-20 DIAGNOSIS — M86171 Other acute osteomyelitis, right ankle and foot: Secondary | ICD-10-CM | POA: Diagnosis not present

## 2017-12-20 DIAGNOSIS — E1169 Type 2 diabetes mellitus with other specified complication: Secondary | ICD-10-CM | POA: Diagnosis not present

## 2017-12-20 DIAGNOSIS — E1165 Type 2 diabetes mellitus with hyperglycemia: Secondary | ICD-10-CM | POA: Diagnosis not present

## 2017-12-20 DIAGNOSIS — B9562 Methicillin resistant Staphylococcus aureus infection as the cause of diseases classified elsewhere: Secondary | ICD-10-CM | POA: Diagnosis not present

## 2017-12-22 DIAGNOSIS — B9562 Methicillin resistant Staphylococcus aureus infection as the cause of diseases classified elsewhere: Secondary | ICD-10-CM | POA: Diagnosis not present

## 2017-12-22 DIAGNOSIS — Z89411 Acquired absence of right great toe: Secondary | ICD-10-CM | POA: Diagnosis not present

## 2017-12-22 DIAGNOSIS — Z4781 Encounter for orthopedic aftercare following surgical amputation: Secondary | ICD-10-CM | POA: Diagnosis not present

## 2017-12-22 DIAGNOSIS — E1169 Type 2 diabetes mellitus with other specified complication: Secondary | ICD-10-CM | POA: Diagnosis not present

## 2017-12-22 DIAGNOSIS — E1165 Type 2 diabetes mellitus with hyperglycemia: Secondary | ICD-10-CM | POA: Diagnosis not present

## 2017-12-22 DIAGNOSIS — M86171 Other acute osteomyelitis, right ankle and foot: Secondary | ICD-10-CM | POA: Diagnosis not present

## 2017-12-25 DIAGNOSIS — Z794 Long term (current) use of insulin: Secondary | ICD-10-CM | POA: Diagnosis not present

## 2017-12-25 DIAGNOSIS — E1169 Type 2 diabetes mellitus with other specified complication: Secondary | ICD-10-CM | POA: Diagnosis not present

## 2017-12-25 DIAGNOSIS — I2510 Atherosclerotic heart disease of native coronary artery without angina pectoris: Secondary | ICD-10-CM | POA: Diagnosis not present

## 2017-12-25 DIAGNOSIS — G4730 Sleep apnea, unspecified: Secondary | ICD-10-CM | POA: Diagnosis not present

## 2017-12-25 DIAGNOSIS — Z7982 Long term (current) use of aspirin: Secondary | ICD-10-CM | POA: Diagnosis not present

## 2017-12-25 DIAGNOSIS — Z89411 Acquired absence of right great toe: Secondary | ICD-10-CM | POA: Diagnosis not present

## 2017-12-25 DIAGNOSIS — I10 Essential (primary) hypertension: Secondary | ICD-10-CM | POA: Diagnosis not present

## 2017-12-25 DIAGNOSIS — E1165 Type 2 diabetes mellitus with hyperglycemia: Secondary | ICD-10-CM | POA: Diagnosis not present

## 2017-12-25 DIAGNOSIS — B9562 Methicillin resistant Staphylococcus aureus infection as the cause of diseases classified elsewhere: Secondary | ICD-10-CM | POA: Diagnosis not present

## 2017-12-25 DIAGNOSIS — Z4781 Encounter for orthopedic aftercare following surgical amputation: Secondary | ICD-10-CM | POA: Diagnosis not present

## 2017-12-25 DIAGNOSIS — Z6838 Body mass index (BMI) 38.0-38.9, adult: Secondary | ICD-10-CM | POA: Diagnosis not present

## 2017-12-25 DIAGNOSIS — M86171 Other acute osteomyelitis, right ankle and foot: Secondary | ICD-10-CM | POA: Diagnosis not present

## 2017-12-25 DIAGNOSIS — Z792 Long term (current) use of antibiotics: Secondary | ICD-10-CM | POA: Diagnosis not present

## 2017-12-25 DIAGNOSIS — Z4801 Encounter for change or removal of surgical wound dressing: Secondary | ICD-10-CM | POA: Diagnosis not present

## 2017-12-27 DIAGNOSIS — M86171 Other acute osteomyelitis, right ankle and foot: Secondary | ICD-10-CM | POA: Diagnosis not present

## 2017-12-27 DIAGNOSIS — E1169 Type 2 diabetes mellitus with other specified complication: Secondary | ICD-10-CM | POA: Diagnosis not present

## 2017-12-27 DIAGNOSIS — B9562 Methicillin resistant Staphylococcus aureus infection as the cause of diseases classified elsewhere: Secondary | ICD-10-CM | POA: Diagnosis not present

## 2017-12-27 DIAGNOSIS — Z4781 Encounter for orthopedic aftercare following surgical amputation: Secondary | ICD-10-CM | POA: Diagnosis not present

## 2017-12-27 DIAGNOSIS — E1165 Type 2 diabetes mellitus with hyperglycemia: Secondary | ICD-10-CM | POA: Diagnosis not present

## 2017-12-27 DIAGNOSIS — Z89411 Acquired absence of right great toe: Secondary | ICD-10-CM | POA: Diagnosis not present

## 2017-12-29 DIAGNOSIS — B9562 Methicillin resistant Staphylococcus aureus infection as the cause of diseases classified elsewhere: Secondary | ICD-10-CM | POA: Diagnosis not present

## 2017-12-29 DIAGNOSIS — M86171 Other acute osteomyelitis, right ankle and foot: Secondary | ICD-10-CM | POA: Diagnosis not present

## 2017-12-29 DIAGNOSIS — E1165 Type 2 diabetes mellitus with hyperglycemia: Secondary | ICD-10-CM | POA: Diagnosis not present

## 2017-12-29 DIAGNOSIS — E1169 Type 2 diabetes mellitus with other specified complication: Secondary | ICD-10-CM | POA: Diagnosis not present

## 2017-12-29 DIAGNOSIS — Z4781 Encounter for orthopedic aftercare following surgical amputation: Secondary | ICD-10-CM | POA: Diagnosis not present

## 2017-12-29 DIAGNOSIS — Z89411 Acquired absence of right great toe: Secondary | ICD-10-CM | POA: Diagnosis not present

## 2018-01-01 DIAGNOSIS — Z4781 Encounter for orthopedic aftercare following surgical amputation: Secondary | ICD-10-CM | POA: Diagnosis not present

## 2018-01-01 DIAGNOSIS — B9562 Methicillin resistant Staphylococcus aureus infection as the cause of diseases classified elsewhere: Secondary | ICD-10-CM | POA: Diagnosis not present

## 2018-01-01 DIAGNOSIS — E1169 Type 2 diabetes mellitus with other specified complication: Secondary | ICD-10-CM | POA: Diagnosis not present

## 2018-01-01 DIAGNOSIS — M86171 Other acute osteomyelitis, right ankle and foot: Secondary | ICD-10-CM | POA: Diagnosis not present

## 2018-01-01 DIAGNOSIS — E1165 Type 2 diabetes mellitus with hyperglycemia: Secondary | ICD-10-CM | POA: Diagnosis not present

## 2018-01-01 DIAGNOSIS — Z89411 Acquired absence of right great toe: Secondary | ICD-10-CM | POA: Diagnosis not present

## 2018-01-03 DIAGNOSIS — E1169 Type 2 diabetes mellitus with other specified complication: Secondary | ICD-10-CM | POA: Diagnosis not present

## 2018-01-03 DIAGNOSIS — Z4781 Encounter for orthopedic aftercare following surgical amputation: Secondary | ICD-10-CM | POA: Diagnosis not present

## 2018-01-03 DIAGNOSIS — M86171 Other acute osteomyelitis, right ankle and foot: Secondary | ICD-10-CM | POA: Diagnosis not present

## 2018-01-03 DIAGNOSIS — B9562 Methicillin resistant Staphylococcus aureus infection as the cause of diseases classified elsewhere: Secondary | ICD-10-CM | POA: Diagnosis not present

## 2018-01-03 DIAGNOSIS — Z89411 Acquired absence of right great toe: Secondary | ICD-10-CM | POA: Diagnosis not present

## 2018-01-03 DIAGNOSIS — E1165 Type 2 diabetes mellitus with hyperglycemia: Secondary | ICD-10-CM | POA: Diagnosis not present

## 2018-01-05 DIAGNOSIS — E11621 Type 2 diabetes mellitus with foot ulcer: Secondary | ICD-10-CM | POA: Diagnosis not present

## 2018-01-05 DIAGNOSIS — L97518 Non-pressure chronic ulcer of other part of right foot with other specified severity: Secondary | ICD-10-CM | POA: Diagnosis not present

## 2018-01-05 DIAGNOSIS — L89629 Pressure ulcer of left heel, unspecified stage: Secondary | ICD-10-CM | POA: Diagnosis not present

## 2018-01-05 DIAGNOSIS — L89619 Pressure ulcer of right heel, unspecified stage: Secondary | ICD-10-CM | POA: Diagnosis not present

## 2018-01-05 DIAGNOSIS — Z89411 Acquired absence of right great toe: Secondary | ICD-10-CM | POA: Diagnosis not present

## 2018-01-08 DIAGNOSIS — B9562 Methicillin resistant Staphylococcus aureus infection as the cause of diseases classified elsewhere: Secondary | ICD-10-CM | POA: Diagnosis not present

## 2018-01-08 DIAGNOSIS — M86171 Other acute osteomyelitis, right ankle and foot: Secondary | ICD-10-CM | POA: Diagnosis not present

## 2018-01-08 DIAGNOSIS — Z89411 Acquired absence of right great toe: Secondary | ICD-10-CM | POA: Diagnosis not present

## 2018-01-08 DIAGNOSIS — E1169 Type 2 diabetes mellitus with other specified complication: Secondary | ICD-10-CM | POA: Diagnosis not present

## 2018-01-08 DIAGNOSIS — E1165 Type 2 diabetes mellitus with hyperglycemia: Secondary | ICD-10-CM | POA: Diagnosis not present

## 2018-01-08 DIAGNOSIS — Z4781 Encounter for orthopedic aftercare following surgical amputation: Secondary | ICD-10-CM | POA: Diagnosis not present

## 2018-01-11 DIAGNOSIS — Z4781 Encounter for orthopedic aftercare following surgical amputation: Secondary | ICD-10-CM | POA: Diagnosis not present

## 2018-01-11 DIAGNOSIS — B9562 Methicillin resistant Staphylococcus aureus infection as the cause of diseases classified elsewhere: Secondary | ICD-10-CM | POA: Diagnosis not present

## 2018-01-11 DIAGNOSIS — Z89411 Acquired absence of right great toe: Secondary | ICD-10-CM | POA: Diagnosis not present

## 2018-01-11 DIAGNOSIS — M86171 Other acute osteomyelitis, right ankle and foot: Secondary | ICD-10-CM | POA: Diagnosis not present

## 2018-01-11 DIAGNOSIS — E1169 Type 2 diabetes mellitus with other specified complication: Secondary | ICD-10-CM | POA: Diagnosis not present

## 2018-01-11 DIAGNOSIS — E1165 Type 2 diabetes mellitus with hyperglycemia: Secondary | ICD-10-CM | POA: Diagnosis not present

## 2018-01-15 DIAGNOSIS — B9562 Methicillin resistant Staphylococcus aureus infection as the cause of diseases classified elsewhere: Secondary | ICD-10-CM | POA: Diagnosis not present

## 2018-01-15 DIAGNOSIS — I10 Essential (primary) hypertension: Secondary | ICD-10-CM | POA: Diagnosis not present

## 2018-01-15 DIAGNOSIS — Z89411 Acquired absence of right great toe: Secondary | ICD-10-CM | POA: Diagnosis not present

## 2018-01-15 DIAGNOSIS — E11610 Type 2 diabetes mellitus with diabetic neuropathic arthropathy: Secondary | ICD-10-CM | POA: Diagnosis not present

## 2018-01-15 DIAGNOSIS — E1169 Type 2 diabetes mellitus with other specified complication: Secondary | ICD-10-CM | POA: Diagnosis not present

## 2018-01-15 DIAGNOSIS — M86171 Other acute osteomyelitis, right ankle and foot: Secondary | ICD-10-CM | POA: Diagnosis not present

## 2018-01-15 DIAGNOSIS — E7849 Other hyperlipidemia: Secondary | ICD-10-CM | POA: Diagnosis not present

## 2018-01-15 DIAGNOSIS — E1165 Type 2 diabetes mellitus with hyperglycemia: Secondary | ICD-10-CM | POA: Diagnosis not present

## 2018-01-15 DIAGNOSIS — Z4781 Encounter for orthopedic aftercare following surgical amputation: Secondary | ICD-10-CM | POA: Diagnosis not present

## 2018-01-18 DIAGNOSIS — E1169 Type 2 diabetes mellitus with other specified complication: Secondary | ICD-10-CM | POA: Diagnosis not present

## 2018-01-18 DIAGNOSIS — B9562 Methicillin resistant Staphylococcus aureus infection as the cause of diseases classified elsewhere: Secondary | ICD-10-CM | POA: Diagnosis not present

## 2018-01-18 DIAGNOSIS — Z89411 Acquired absence of right great toe: Secondary | ICD-10-CM | POA: Diagnosis not present

## 2018-01-18 DIAGNOSIS — M86171 Other acute osteomyelitis, right ankle and foot: Secondary | ICD-10-CM | POA: Diagnosis not present

## 2018-01-18 DIAGNOSIS — Z4781 Encounter for orthopedic aftercare following surgical amputation: Secondary | ICD-10-CM | POA: Diagnosis not present

## 2018-01-18 DIAGNOSIS — E1165 Type 2 diabetes mellitus with hyperglycemia: Secondary | ICD-10-CM | POA: Diagnosis not present

## 2018-01-23 DIAGNOSIS — M86171 Other acute osteomyelitis, right ankle and foot: Secondary | ICD-10-CM | POA: Diagnosis not present

## 2018-01-23 DIAGNOSIS — B9562 Methicillin resistant Staphylococcus aureus infection as the cause of diseases classified elsewhere: Secondary | ICD-10-CM | POA: Diagnosis not present

## 2018-01-23 DIAGNOSIS — E1169 Type 2 diabetes mellitus with other specified complication: Secondary | ICD-10-CM | POA: Diagnosis not present

## 2018-01-23 DIAGNOSIS — Z89411 Acquired absence of right great toe: Secondary | ICD-10-CM | POA: Diagnosis not present

## 2018-01-23 DIAGNOSIS — Z4781 Encounter for orthopedic aftercare following surgical amputation: Secondary | ICD-10-CM | POA: Diagnosis not present

## 2018-01-23 DIAGNOSIS — E1165 Type 2 diabetes mellitus with hyperglycemia: Secondary | ICD-10-CM | POA: Diagnosis not present

## 2018-01-26 DIAGNOSIS — M86171 Other acute osteomyelitis, right ankle and foot: Secondary | ICD-10-CM | POA: Diagnosis not present

## 2018-01-26 DIAGNOSIS — Z89411 Acquired absence of right great toe: Secondary | ICD-10-CM | POA: Diagnosis not present

## 2018-01-26 DIAGNOSIS — E11621 Type 2 diabetes mellitus with foot ulcer: Secondary | ICD-10-CM | POA: Diagnosis not present

## 2018-01-26 DIAGNOSIS — M869 Osteomyelitis, unspecified: Secondary | ICD-10-CM | POA: Diagnosis not present

## 2018-01-26 DIAGNOSIS — L97513 Non-pressure chronic ulcer of other part of right foot with necrosis of muscle: Secondary | ICD-10-CM | POA: Diagnosis not present

## 2018-01-26 DIAGNOSIS — L97518 Non-pressure chronic ulcer of other part of right foot with other specified severity: Secondary | ICD-10-CM | POA: Diagnosis not present

## 2018-01-30 DIAGNOSIS — B9562 Methicillin resistant Staphylococcus aureus infection as the cause of diseases classified elsewhere: Secondary | ICD-10-CM | POA: Diagnosis not present

## 2018-01-30 DIAGNOSIS — Z89411 Acquired absence of right great toe: Secondary | ICD-10-CM | POA: Diagnosis not present

## 2018-01-30 DIAGNOSIS — E1169 Type 2 diabetes mellitus with other specified complication: Secondary | ICD-10-CM | POA: Diagnosis not present

## 2018-01-30 DIAGNOSIS — Z4781 Encounter for orthopedic aftercare following surgical amputation: Secondary | ICD-10-CM | POA: Diagnosis not present

## 2018-01-30 DIAGNOSIS — E1165 Type 2 diabetes mellitus with hyperglycemia: Secondary | ICD-10-CM | POA: Diagnosis not present

## 2018-01-30 DIAGNOSIS — M86171 Other acute osteomyelitis, right ankle and foot: Secondary | ICD-10-CM | POA: Diagnosis not present

## 2018-02-02 DIAGNOSIS — M86171 Other acute osteomyelitis, right ankle and foot: Secondary | ICD-10-CM | POA: Diagnosis not present

## 2018-02-02 DIAGNOSIS — E1165 Type 2 diabetes mellitus with hyperglycemia: Secondary | ICD-10-CM | POA: Diagnosis not present

## 2018-02-02 DIAGNOSIS — Z4781 Encounter for orthopedic aftercare following surgical amputation: Secondary | ICD-10-CM | POA: Diagnosis not present

## 2018-02-02 DIAGNOSIS — Z89411 Acquired absence of right great toe: Secondary | ICD-10-CM | POA: Diagnosis not present

## 2018-02-02 DIAGNOSIS — B9562 Methicillin resistant Staphylococcus aureus infection as the cause of diseases classified elsewhere: Secondary | ICD-10-CM | POA: Diagnosis not present

## 2018-02-02 DIAGNOSIS — E1169 Type 2 diabetes mellitus with other specified complication: Secondary | ICD-10-CM | POA: Diagnosis not present

## 2018-02-05 DIAGNOSIS — M86171 Other acute osteomyelitis, right ankle and foot: Secondary | ICD-10-CM | POA: Diagnosis not present

## 2018-02-05 DIAGNOSIS — B9562 Methicillin resistant Staphylococcus aureus infection as the cause of diseases classified elsewhere: Secondary | ICD-10-CM | POA: Diagnosis not present

## 2018-02-05 DIAGNOSIS — Z89411 Acquired absence of right great toe: Secondary | ICD-10-CM | POA: Diagnosis not present

## 2018-02-05 DIAGNOSIS — E1169 Type 2 diabetes mellitus with other specified complication: Secondary | ICD-10-CM | POA: Diagnosis not present

## 2018-02-05 DIAGNOSIS — E1165 Type 2 diabetes mellitus with hyperglycemia: Secondary | ICD-10-CM | POA: Diagnosis not present

## 2018-02-05 DIAGNOSIS — Z4781 Encounter for orthopedic aftercare following surgical amputation: Secondary | ICD-10-CM | POA: Diagnosis not present

## 2018-02-08 DIAGNOSIS — E1169 Type 2 diabetes mellitus with other specified complication: Secondary | ICD-10-CM | POA: Diagnosis not present

## 2018-02-08 DIAGNOSIS — Z89411 Acquired absence of right great toe: Secondary | ICD-10-CM | POA: Diagnosis not present

## 2018-02-08 DIAGNOSIS — M86171 Other acute osteomyelitis, right ankle and foot: Secondary | ICD-10-CM | POA: Diagnosis not present

## 2018-02-08 DIAGNOSIS — E1165 Type 2 diabetes mellitus with hyperglycemia: Secondary | ICD-10-CM | POA: Diagnosis not present

## 2018-02-08 DIAGNOSIS — Z4781 Encounter for orthopedic aftercare following surgical amputation: Secondary | ICD-10-CM | POA: Diagnosis not present

## 2018-02-08 DIAGNOSIS — B9562 Methicillin resistant Staphylococcus aureus infection as the cause of diseases classified elsewhere: Secondary | ICD-10-CM | POA: Diagnosis not present

## 2018-02-12 DIAGNOSIS — Z4781 Encounter for orthopedic aftercare following surgical amputation: Secondary | ICD-10-CM | POA: Diagnosis not present

## 2018-02-12 DIAGNOSIS — M86171 Other acute osteomyelitis, right ankle and foot: Secondary | ICD-10-CM | POA: Diagnosis not present

## 2018-02-12 DIAGNOSIS — E1169 Type 2 diabetes mellitus with other specified complication: Secondary | ICD-10-CM | POA: Diagnosis not present

## 2018-02-12 DIAGNOSIS — Z89411 Acquired absence of right great toe: Secondary | ICD-10-CM | POA: Diagnosis not present

## 2018-02-12 DIAGNOSIS — E1165 Type 2 diabetes mellitus with hyperglycemia: Secondary | ICD-10-CM | POA: Diagnosis not present

## 2018-02-12 DIAGNOSIS — B9562 Methicillin resistant Staphylococcus aureus infection as the cause of diseases classified elsewhere: Secondary | ICD-10-CM | POA: Diagnosis not present

## 2018-02-13 DIAGNOSIS — M869 Osteomyelitis, unspecified: Secondary | ICD-10-CM | POA: Diagnosis not present

## 2018-02-13 DIAGNOSIS — L97518 Non-pressure chronic ulcer of other part of right foot with other specified severity: Secondary | ICD-10-CM | POA: Diagnosis not present

## 2018-02-13 DIAGNOSIS — Z89411 Acquired absence of right great toe: Secondary | ICD-10-CM | POA: Diagnosis not present

## 2018-02-13 DIAGNOSIS — L97513 Non-pressure chronic ulcer of other part of right foot with necrosis of muscle: Secondary | ICD-10-CM | POA: Diagnosis not present

## 2018-02-13 DIAGNOSIS — E11621 Type 2 diabetes mellitus with foot ulcer: Secondary | ICD-10-CM | POA: Diagnosis not present

## 2018-02-15 DIAGNOSIS — Z89411 Acquired absence of right great toe: Secondary | ICD-10-CM | POA: Diagnosis not present

## 2018-02-15 DIAGNOSIS — E1169 Type 2 diabetes mellitus with other specified complication: Secondary | ICD-10-CM | POA: Diagnosis not present

## 2018-02-15 DIAGNOSIS — E1165 Type 2 diabetes mellitus with hyperglycemia: Secondary | ICD-10-CM | POA: Diagnosis not present

## 2018-02-15 DIAGNOSIS — M86171 Other acute osteomyelitis, right ankle and foot: Secondary | ICD-10-CM | POA: Diagnosis not present

## 2018-02-15 DIAGNOSIS — B9562 Methicillin resistant Staphylococcus aureus infection as the cause of diseases classified elsewhere: Secondary | ICD-10-CM | POA: Diagnosis not present

## 2018-02-15 DIAGNOSIS — Z4781 Encounter for orthopedic aftercare following surgical amputation: Secondary | ICD-10-CM | POA: Diagnosis not present

## 2018-02-19 DIAGNOSIS — B9562 Methicillin resistant Staphylococcus aureus infection as the cause of diseases classified elsewhere: Secondary | ICD-10-CM | POA: Diagnosis not present

## 2018-02-19 DIAGNOSIS — E1165 Type 2 diabetes mellitus with hyperglycemia: Secondary | ICD-10-CM | POA: Diagnosis not present

## 2018-02-19 DIAGNOSIS — M86171 Other acute osteomyelitis, right ankle and foot: Secondary | ICD-10-CM | POA: Diagnosis not present

## 2018-02-19 DIAGNOSIS — Z4781 Encounter for orthopedic aftercare following surgical amputation: Secondary | ICD-10-CM | POA: Diagnosis not present

## 2018-02-19 DIAGNOSIS — Z89411 Acquired absence of right great toe: Secondary | ICD-10-CM | POA: Diagnosis not present

## 2018-02-19 DIAGNOSIS — E1169 Type 2 diabetes mellitus with other specified complication: Secondary | ICD-10-CM | POA: Diagnosis not present

## 2018-02-22 DIAGNOSIS — B9562 Methicillin resistant Staphylococcus aureus infection as the cause of diseases classified elsewhere: Secondary | ICD-10-CM | POA: Diagnosis not present

## 2018-02-22 DIAGNOSIS — E1165 Type 2 diabetes mellitus with hyperglycemia: Secondary | ICD-10-CM | POA: Diagnosis not present

## 2018-02-22 DIAGNOSIS — E1169 Type 2 diabetes mellitus with other specified complication: Secondary | ICD-10-CM | POA: Diagnosis not present

## 2018-02-22 DIAGNOSIS — Z89411 Acquired absence of right great toe: Secondary | ICD-10-CM | POA: Diagnosis not present

## 2018-02-22 DIAGNOSIS — Z4781 Encounter for orthopedic aftercare following surgical amputation: Secondary | ICD-10-CM | POA: Diagnosis not present

## 2018-02-22 DIAGNOSIS — M86171 Other acute osteomyelitis, right ankle and foot: Secondary | ICD-10-CM | POA: Diagnosis not present

## 2018-02-23 DIAGNOSIS — Z7982 Long term (current) use of aspirin: Secondary | ICD-10-CM | POA: Diagnosis not present

## 2018-02-23 DIAGNOSIS — M86171 Other acute osteomyelitis, right ankle and foot: Secondary | ICD-10-CM | POA: Diagnosis not present

## 2018-02-23 DIAGNOSIS — G4730 Sleep apnea, unspecified: Secondary | ICD-10-CM | POA: Diagnosis not present

## 2018-02-23 DIAGNOSIS — Z794 Long term (current) use of insulin: Secondary | ICD-10-CM | POA: Diagnosis not present

## 2018-02-23 DIAGNOSIS — E1165 Type 2 diabetes mellitus with hyperglycemia: Secondary | ICD-10-CM | POA: Diagnosis not present

## 2018-02-23 DIAGNOSIS — E1169 Type 2 diabetes mellitus with other specified complication: Secondary | ICD-10-CM | POA: Diagnosis not present

## 2018-02-23 DIAGNOSIS — Z4781 Encounter for orthopedic aftercare following surgical amputation: Secondary | ICD-10-CM | POA: Diagnosis not present

## 2018-02-23 DIAGNOSIS — E11610 Type 2 diabetes mellitus with diabetic neuropathic arthropathy: Secondary | ICD-10-CM | POA: Diagnosis not present

## 2018-02-23 DIAGNOSIS — E7849 Other hyperlipidemia: Secondary | ICD-10-CM | POA: Diagnosis not present

## 2018-02-23 DIAGNOSIS — Z89411 Acquired absence of right great toe: Secondary | ICD-10-CM | POA: Diagnosis not present

## 2018-02-23 DIAGNOSIS — B9562 Methicillin resistant Staphylococcus aureus infection as the cause of diseases classified elsewhere: Secondary | ICD-10-CM | POA: Diagnosis not present

## 2018-02-23 DIAGNOSIS — Z6838 Body mass index (BMI) 38.0-38.9, adult: Secondary | ICD-10-CM | POA: Diagnosis not present

## 2018-02-23 DIAGNOSIS — I2510 Atherosclerotic heart disease of native coronary artery without angina pectoris: Secondary | ICD-10-CM | POA: Diagnosis not present

## 2018-02-23 DIAGNOSIS — Z4801 Encounter for change or removal of surgical wound dressing: Secondary | ICD-10-CM | POA: Diagnosis not present

## 2018-02-23 DIAGNOSIS — I10 Essential (primary) hypertension: Secondary | ICD-10-CM | POA: Diagnosis not present

## 2018-02-26 DIAGNOSIS — E785 Hyperlipidemia, unspecified: Secondary | ICD-10-CM | POA: Diagnosis not present

## 2018-02-26 DIAGNOSIS — G5601 Carpal tunnel syndrome, right upper limb: Secondary | ICD-10-CM | POA: Diagnosis not present

## 2018-02-26 DIAGNOSIS — E11610 Type 2 diabetes mellitus with diabetic neuropathic arthropathy: Secondary | ICD-10-CM | POA: Diagnosis not present

## 2018-02-26 DIAGNOSIS — I2584 Coronary atherosclerosis due to calcified coronary lesion: Secondary | ICD-10-CM | POA: Diagnosis not present

## 2018-02-26 DIAGNOSIS — Z4781 Encounter for orthopedic aftercare following surgical amputation: Secondary | ICD-10-CM | POA: Diagnosis not present

## 2018-02-26 DIAGNOSIS — I10 Essential (primary) hypertension: Secondary | ICD-10-CM | POA: Diagnosis not present

## 2018-02-26 DIAGNOSIS — Z89411 Acquired absence of right great toe: Secondary | ICD-10-CM | POA: Diagnosis not present

## 2018-02-26 DIAGNOSIS — M86171 Other acute osteomyelitis, right ankle and foot: Secondary | ICD-10-CM | POA: Diagnosis not present

## 2018-02-26 DIAGNOSIS — E1169 Type 2 diabetes mellitus with other specified complication: Secondary | ICD-10-CM | POA: Diagnosis not present

## 2018-02-26 DIAGNOSIS — B9562 Methicillin resistant Staphylococcus aureus infection as the cause of diseases classified elsewhere: Secondary | ICD-10-CM | POA: Diagnosis not present

## 2018-02-26 DIAGNOSIS — E7849 Other hyperlipidemia: Secondary | ICD-10-CM | POA: Diagnosis not present

## 2018-02-26 DIAGNOSIS — E1165 Type 2 diabetes mellitus with hyperglycemia: Secondary | ICD-10-CM | POA: Diagnosis not present

## 2018-02-27 DIAGNOSIS — E11621 Type 2 diabetes mellitus with foot ulcer: Secondary | ICD-10-CM | POA: Diagnosis not present

## 2018-02-27 DIAGNOSIS — Z89411 Acquired absence of right great toe: Secondary | ICD-10-CM | POA: Diagnosis not present

## 2018-02-27 DIAGNOSIS — L97513 Non-pressure chronic ulcer of other part of right foot with necrosis of muscle: Secondary | ICD-10-CM | POA: Diagnosis not present

## 2018-03-01 DIAGNOSIS — Z89411 Acquired absence of right great toe: Secondary | ICD-10-CM | POA: Diagnosis not present

## 2018-03-01 DIAGNOSIS — E1165 Type 2 diabetes mellitus with hyperglycemia: Secondary | ICD-10-CM | POA: Diagnosis not present

## 2018-03-01 DIAGNOSIS — E1169 Type 2 diabetes mellitus with other specified complication: Secondary | ICD-10-CM | POA: Diagnosis not present

## 2018-03-01 DIAGNOSIS — M86171 Other acute osteomyelitis, right ankle and foot: Secondary | ICD-10-CM | POA: Diagnosis not present

## 2018-03-01 DIAGNOSIS — B9562 Methicillin resistant Staphylococcus aureus infection as the cause of diseases classified elsewhere: Secondary | ICD-10-CM | POA: Diagnosis not present

## 2018-03-01 DIAGNOSIS — Z4781 Encounter for orthopedic aftercare following surgical amputation: Secondary | ICD-10-CM | POA: Diagnosis not present

## 2018-03-07 DIAGNOSIS — Z4781 Encounter for orthopedic aftercare following surgical amputation: Secondary | ICD-10-CM | POA: Diagnosis not present

## 2018-03-07 DIAGNOSIS — E1165 Type 2 diabetes mellitus with hyperglycemia: Secondary | ICD-10-CM | POA: Diagnosis not present

## 2018-03-07 DIAGNOSIS — B9562 Methicillin resistant Staphylococcus aureus infection as the cause of diseases classified elsewhere: Secondary | ICD-10-CM | POA: Diagnosis not present

## 2018-03-07 DIAGNOSIS — E1169 Type 2 diabetes mellitus with other specified complication: Secondary | ICD-10-CM | POA: Diagnosis not present

## 2018-03-07 DIAGNOSIS — M86171 Other acute osteomyelitis, right ankle and foot: Secondary | ICD-10-CM | POA: Diagnosis not present

## 2018-03-07 DIAGNOSIS — Z89411 Acquired absence of right great toe: Secondary | ICD-10-CM | POA: Diagnosis not present

## 2018-03-21 DIAGNOSIS — E11610 Type 2 diabetes mellitus with diabetic neuropathic arthropathy: Secondary | ICD-10-CM | POA: Diagnosis not present

## 2018-03-26 DIAGNOSIS — E11610 Type 2 diabetes mellitus with diabetic neuropathic arthropathy: Secondary | ICD-10-CM | POA: Diagnosis not present

## 2018-03-26 DIAGNOSIS — I10 Essential (primary) hypertension: Secondary | ICD-10-CM | POA: Diagnosis not present

## 2018-03-26 DIAGNOSIS — E7849 Other hyperlipidemia: Secondary | ICD-10-CM | POA: Diagnosis not present

## 2018-03-30 ENCOUNTER — Encounter: Payer: Self-pay | Admitting: *Deleted

## 2018-04-02 ENCOUNTER — Encounter: Payer: Self-pay | Admitting: Cardiology

## 2018-04-02 ENCOUNTER — Ambulatory Visit (INDEPENDENT_AMBULATORY_CARE_PROVIDER_SITE_OTHER): Payer: Medicare Other | Admitting: Cardiology

## 2018-04-02 ENCOUNTER — Other Ambulatory Visit: Payer: Self-pay

## 2018-04-02 VITALS — BP 101/66 | HR 68 | Ht 73.0 in | Wt 276.2 lb

## 2018-04-02 DIAGNOSIS — I255 Ischemic cardiomyopathy: Secondary | ICD-10-CM

## 2018-04-02 DIAGNOSIS — I2510 Atherosclerotic heart disease of native coronary artery without angina pectoris: Secondary | ICD-10-CM | POA: Diagnosis not present

## 2018-04-02 DIAGNOSIS — I5022 Chronic systolic (congestive) heart failure: Secondary | ICD-10-CM

## 2018-04-02 MED ORDER — ASPIRIN EC 81 MG PO TBEC: 81.0000 mg | DELAYED_RELEASE_TABLET | Freq: Every day | ORAL | 3 refills | Status: DC

## 2018-04-02 NOTE — Patient Instructions (Addendum)
Medication Instructions:   Your physician has recommended you make the following change in your medication:   Start aspirin 81 mg by mouth daily  Continue all other medications the same  Labwork:  NONE  Testing/Procedures: Your physician has requested that you have an echocardiogram. Echocardiography is a painless test that uses sound waves to create images of your heart. It provides your doctor with information about the size and shape of your heart and how well your heart's chambers and valves are working. This procedure takes approximately one hour. There are no restrictions for this procedure.  Follow-Up:  Your physician recommends that you schedule a follow-up appointment in: 2 months.  Any Other Special Instructions Will Be Listed Below (If Applicable).  If you need a refill on your cardiac medications before your next appointment, please call your pharmacy.

## 2018-04-02 NOTE — Progress Notes (Signed)
Clinical Summary Mr. [name] is a 48 y.o.male seen today for follow up of the following medical problems.   1. Chronic systolic HF/ICM/CAD - 05/2017 nuclear stress: Large fixed defects anterior, lateral, inferior myocardium. NO reversibility. LVEF 17%. - 07/2017 echo LVEF 30-35%  07/2017 cath LAD 25%, ramus intermedius 50%, LCX occluded, RCA occluded fills by collaterals.  RHC mean PA 33, PCWP 22, CI 2.1 - from cath note could consider PCI to CTO RCA and LCX if myocardium viable. Patient never followed up in clinic after cath.   - compliant with meds. PCP changed from Toprol to short acting.  - no SOB/DOE. Sedentary lifestyle. No recent edema - no chest pain.   2. HTN - remains compliant with meds  3. Orthostatic dizziness -no recent symptoms.   4. Foot sore/Osteomyelitis - normal ABIs 08/2017 - s/p amputation of great toe, has healed.  - followed by wound care   Past Medical History:  Diagnosis Date  . Angina pectoris with documented spasm (HCC)   . Corns and callosities   . Diabetes mellitus without complication (HCC)   . Hyperlipidemia   . Hypertension   . Other sleep apnea   . Pancreatitis      No Known Allergies   Current Outpatient Medications  Medication Sig Dispense Refill  . amoxicillin-clavulanate (AUGMENTIN) 500-125 MG tablet Take 1 tablet by mouth 3 (three) times daily.    Marland Kitchen FLUoxetine (PROZAC) 20 MG tablet Take 20 mg by mouth daily.    Marland Kitchen gabapentin (NEURONTIN) 300 MG capsule Take 300 mg by mouth 4 (four) times daily.    Marland Kitchen LEVEMIR FLEXTOUCH 100 UNIT/ML Pen Inject 20 Units into the skin at bedtime.  2  . lisinopril (PRINIVIL,ZESTRIL) 20 MG tablet Take 1 tablet (20 mg total) by mouth daily. 30 tablet 0  . metFORMIN (GLUCOPHAGE) 1000 MG tablet Take 1,000 mg by mouth 2 (two) times daily with a meal.    . metoprolol succinate (TOPROL XL) 25 MG 24 hr tablet Take 1 tablet (25 mg total) by mouth daily. 30 tablet 0  . naproxen (NAPROSYN) 500 MG  tablet Take 500 mg by mouth 2 (two) times daily with a meal.    . neomycin-bacitracin-polymyxin (NEOSPORIN) [phone] ointment Apply 1 application topically 2 (two) times daily. On toe    . simvastatin (ZOCOR) 20 MG tablet Take 20 mg by mouth daily.     No current facility-administered medications for this visit.      Past Surgical History:  Procedure Laterality Date  . APPENDECTOMY  2016  . OTHER SURGICAL HISTORY  2016  . RIGHT/LEFT HEART CATH AND CORONARY ANGIOGRAPHY N/A 08/08/2017   Procedure: RIGHT/LEFT HEART CATH AND CORONARY ANGIOGRAPHY;  Surgeon: [name], MD;  Location: [facility] INVASIVE CV LAB;  Service: Cardiovascular;  Laterality: N/A;     No Known Allergies    Family History  Problem Relation Age of Onset  . Aneurysm Father      Social History Mr. [name] reports that he quit smoking about 12 months ago. His smoking use included e-cigarettes. He has never used smokeless tobacco. Mr. Thad Ranger [name] reports no history of alcohol use.   Review of Systems CONSTITUTIONAL: No weight loss, fever, chills, weakness or fatigue.  HEENT: Eyes: No visual loss, blurred vision, double vision or yellow sclerae.No hearing loss, sneezing, congestion, runny nose or sore throat.  SKIN: No rash or itching.  CARDIOVASCULAR: per hpi RESPIRATORY: No shortness of breath, cough or sputum.  GASTROINTESTINAL: No  anorexia, nausea, vomiting or diarrhea. No abdominal pain or blood.  GENITOURINARY: No burning on urination, no polyuria NEUROLOGICAL: No headache, dizziness, syncope, paralysis, ataxia, numbness or tingling in the extremities. No change in bowel or bladder control.  MUSCULOSKELETAL: No muscle, back pain, joint pain or stiffness.  LYMPHATICS: No enlarged nodes. No history of splenectomy.  PSYCHIATRIC: No history of depression or anxiety.  ENDOCRINOLOGIC: No reports of sweating, cold or heat intolerance. No polyuria or polydipsia.  Marland Kitchen   Physical Examination Today's  Vitals   04/02/18 1320  BP: 101/66  Pulse: 68  SpO2: 100%  Weight: 276 lb 3.2 oz (125.3 kg)  Height: 6\' 1"  (1.854 m)   Body mass index is 36.44 kg/m.  Gen: resting comfortably, no acute distress HEENT: no scleral icterus, pupils equal round and reactive, no palptable cervical adenopathy,  CV: RRR, no m/r/g, no jvd Resp: Clear to auscultation bilaterally GI: abdomen is soft, non-tender, non-distended, normal bowel sounds, no hepatosplenomegaly MSK: extremities are warm, no edema.  Skin: warm, no rash Neuro:  no focal deficits Psych: appropriate affect   Diagnostic Studies 07/2017 echo LVEF 30-35%, grade I diastolic dysfunction, multiple WMAs, mild MR, mild RV dysfunction  07/2017 cath  Mid RCA lesion is 100% stenosed. Vessel fills by collaterals.  Prox Cx to Mid Cx lesion is 100% stenosed. Vesel fills by left to left collaterals.  Ramus lesion is 50% stenosed.  Mid LAD lesion is 25% stenosed.  LV end diastolic pressure is moderately elevated.  There is no aortic valve stenosis.  Hemodynamic findings consistent with mild pulmonary hypertension.  Ao sat: 96%, PA sat 63%; CO: 5.4 L.min; CI: 2.1    Recommend Aspirin 81mg  daily for moderate CAD.  Consider CTO PCI attempt if the inferior and lateral myocardium is viable, after his foot ulcer has been addressed.    08/2017 ABI Final Interpretation: Right: Resting right ankle-brachial index is within normal range. No evidence of significant right lower extremity arterial disease. The right toe-brachial index is normal.  Left: Resting left ankle-brachial index is within normal range. No evidence of significant left lower extremity arterial disease. The left toe-brachial index is normal. PPG tracings appear dampened.     Assessment and Plan  1. Chronic systolic HF/ICM/CAD.  - had not followed up with [place] for several months after his cath - no current symtoms - repeat echo to see where his LVEF is. If continues to be  low change loprsssor back to [place], consider changing lisinopril to entresto. Likely would work to optimize medical therapy prior to consider viability testing or PCI of his RCA and LCX CTO, particualrly given his lack of symptoms    F/u 2 months.Potential med changes as listed above pending echo     [name], M.D.

## 2018-04-17 ENCOUNTER — Telehealth: Payer: Self-pay | Admitting: Cardiology

## 2018-04-17 NOTE — Telephone Encounter (Signed)
Left message for patient to return call to pre-screen and register for test tomorrow in [place]   COVID-19 Pre-Screening Questions:   Do you currently have a fever?    Have you recently travelled on a cruise, internationally, or to [place], [place], [place], [place], [place], or [place], [place] [employer]) ?       Have you been in contact with someone that is currently pending confirmation of Covid19 testing or has been confirmed to have the Covid19 virus?      Are you currently experiencing fatigue or cough?

## 2018-04-17 NOTE — Telephone Encounter (Signed)
°  °  °  COVID-19 Pre-Screening Questions:   Do you currently have a fever?- no    Have you recently travelled on a cruise, internationally, or to [place], [place], [place], [place], [place], or [place], [place]) ? -No      Have you been in contact with someone that is currently pending confirmation of Covid19 testing or has been confirmed to have the Covid19 virus? -N o   Are you currently experiencing fatigue or cough?-No

## 2018-04-18 ENCOUNTER — Ambulatory Visit (INDEPENDENT_AMBULATORY_CARE_PROVIDER_SITE_OTHER): Payer: Medicare Other

## 2018-04-18 ENCOUNTER — Other Ambulatory Visit: Payer: Self-pay

## 2018-04-18 DIAGNOSIS — I5022 Chronic systolic (congestive) heart failure: Secondary | ICD-10-CM

## 2018-04-19 ENCOUNTER — Other Ambulatory Visit: Payer: Medicare Other

## 2018-04-23 ENCOUNTER — Telehealth: Payer: Self-pay | Admitting: *Deleted

## 2018-04-23 DIAGNOSIS — E7849 Other hyperlipidemia: Secondary | ICD-10-CM | POA: Diagnosis not present

## 2018-04-23 DIAGNOSIS — E11610 Type 2 diabetes mellitus with diabetic neuropathic arthropathy: Secondary | ICD-10-CM | POA: Diagnosis not present

## 2018-04-23 DIAGNOSIS — I10 Essential (primary) hypertension: Secondary | ICD-10-CM | POA: Diagnosis not present

## 2018-04-23 NOTE — Telephone Encounter (Signed)
LM to return call.

## 2018-04-23 NOTE — Telephone Encounter (Signed)
-----   Message from [name], MD sent at 04/20/2018  9:51 AM EDT ----- Echo shows heart function remains weak. We will need to continue to work on adjusting his medications to improve his heart function. Due to COVID-19 I would not make several changes right now as we are limited on how well we can monitor him. I would however stop his lopressor, start him on Toprol 50mg  daily. Discuss further at our 05/2018 appt   [name] MD

## 2018-04-24 MED ORDER — METOPROLOL SUCCINATE ER 50 MG PO TB24: 50.0000 mg | ORAL_TABLET | Freq: Every day | ORAL | 1 refills | Status: DC

## 2018-04-24 NOTE — Telephone Encounter (Signed)
Returned call

## 2018-04-24 NOTE — Telephone Encounter (Signed)
Patient informed and verbalized understanding of plan. Copy sent to PCP 

## 2018-05-03 DIAGNOSIS — W540XXA Bitten by dog, initial encounter: Secondary | ICD-10-CM | POA: Diagnosis not present

## 2018-05-03 DIAGNOSIS — G4730 Sleep apnea, unspecified: Secondary | ICD-10-CM | POA: Diagnosis not present

## 2018-05-03 DIAGNOSIS — I519 Heart disease, unspecified: Secondary | ICD-10-CM | POA: Diagnosis not present

## 2018-05-03 DIAGNOSIS — S01551A Open bite of lip, initial encounter: Secondary | ICD-10-CM | POA: Diagnosis not present

## 2018-05-03 DIAGNOSIS — S0912XA Laceration of muscle and tendon of head, initial encounter: Secondary | ICD-10-CM | POA: Diagnosis not present

## 2018-05-03 DIAGNOSIS — T148XXA Other injury of unspecified body region, initial encounter: Secondary | ICD-10-CM | POA: Diagnosis not present

## 2018-05-03 DIAGNOSIS — Z9049 Acquired absence of other specified parts of digestive tract: Secondary | ICD-10-CM | POA: Diagnosis not present

## 2018-05-03 DIAGNOSIS — F17210 Nicotine dependence, cigarettes, uncomplicated: Secondary | ICD-10-CM | POA: Diagnosis not present

## 2018-05-03 DIAGNOSIS — S01511A Laceration without foreign body of lip, initial encounter: Secondary | ICD-10-CM | POA: Diagnosis not present

## 2018-05-03 DIAGNOSIS — K056 Periodontal disease, unspecified: Secondary | ICD-10-CM | POA: Diagnosis not present

## 2018-05-03 DIAGNOSIS — I10 Essential (primary) hypertension: Secondary | ICD-10-CM | POA: Diagnosis not present

## 2018-05-03 DIAGNOSIS — S01512A Laceration without foreign body of oral cavity, initial encounter: Secondary | ICD-10-CM | POA: Diagnosis not present

## 2018-05-03 DIAGNOSIS — E1165 Type 2 diabetes mellitus with hyperglycemia: Secondary | ICD-10-CM | POA: Diagnosis not present

## 2018-05-03 DIAGNOSIS — E119 Type 2 diabetes mellitus without complications: Secondary | ICD-10-CM | POA: Diagnosis not present

## 2018-05-03 DIAGNOSIS — S0181XA Laceration without foreign body of other part of head, initial encounter: Secondary | ICD-10-CM | POA: Diagnosis not present

## 2018-05-03 DIAGNOSIS — S01501A Unspecified open wound of lip, initial encounter: Secondary | ICD-10-CM | POA: Diagnosis not present

## 2018-05-03 DIAGNOSIS — Z794 Long term (current) use of insulin: Secondary | ICD-10-CM | POA: Diagnosis not present

## 2018-05-03 DIAGNOSIS — K0889 Other specified disorders of teeth and supporting structures: Secondary | ICD-10-CM | POA: Diagnosis not present

## 2018-05-03 DIAGNOSIS — Z7982 Long term (current) use of aspirin: Secondary | ICD-10-CM | POA: Diagnosis not present

## 2018-05-03 DIAGNOSIS — K219 Gastro-esophageal reflux disease without esophagitis: Secondary | ICD-10-CM | POA: Diagnosis not present

## 2018-05-03 DIAGNOSIS — Z89429 Acquired absence of other toe(s), unspecified side: Secondary | ICD-10-CM | POA: Diagnosis not present

## 2018-05-03 DIAGNOSIS — Z79899 Other long term (current) drug therapy: Secondary | ICD-10-CM | POA: Diagnosis not present

## 2018-05-03 DIAGNOSIS — S01411A Laceration without foreign body of right cheek and temporomandibular area, initial encounter: Secondary | ICD-10-CM | POA: Diagnosis not present

## 2018-05-04 DIAGNOSIS — S0181XA Laceration without foreign body of other part of head, initial encounter: Secondary | ICD-10-CM | POA: Diagnosis not present

## 2018-05-25 DIAGNOSIS — I10 Essential (primary) hypertension: Secondary | ICD-10-CM | POA: Diagnosis not present

## 2018-05-25 DIAGNOSIS — E7849 Other hyperlipidemia: Secondary | ICD-10-CM | POA: Diagnosis not present

## 2018-05-25 DIAGNOSIS — E11610 Type 2 diabetes mellitus with diabetic neuropathic arthropathy: Secondary | ICD-10-CM | POA: Diagnosis not present

## 2018-05-28 DIAGNOSIS — I10 Essential (primary) hypertension: Secondary | ICD-10-CM | POA: Diagnosis not present

## 2018-05-28 DIAGNOSIS — F330 Major depressive disorder, recurrent, mild: Secondary | ICD-10-CM | POA: Diagnosis not present

## 2018-05-28 DIAGNOSIS — E11610 Type 2 diabetes mellitus with diabetic neuropathic arthropathy: Secondary | ICD-10-CM | POA: Diagnosis not present

## 2018-06-07 ENCOUNTER — Ambulatory Visit: Payer: Medicare Other | Admitting: Cardiology

## 2018-06-22 DIAGNOSIS — F330 Major depressive disorder, recurrent, mild: Secondary | ICD-10-CM | POA: Diagnosis not present

## 2018-06-22 DIAGNOSIS — E11610 Type 2 diabetes mellitus with diabetic neuropathic arthropathy: Secondary | ICD-10-CM | POA: Diagnosis not present

## 2018-06-22 DIAGNOSIS — I10 Essential (primary) hypertension: Secondary | ICD-10-CM | POA: Diagnosis not present

## 2018-07-18 ENCOUNTER — Other Ambulatory Visit: Payer: Self-pay | Admitting: Cardiology

## 2018-08-04 DIAGNOSIS — N179 Acute kidney failure, unspecified: Secondary | ICD-10-CM | POA: Diagnosis not present

## 2018-08-04 DIAGNOSIS — I2510 Atherosclerotic heart disease of native coronary artery without angina pectoris: Secondary | ICD-10-CM | POA: Diagnosis not present

## 2018-08-04 DIAGNOSIS — Z794 Long term (current) use of insulin: Secondary | ICD-10-CM | POA: Diagnosis not present

## 2018-08-04 DIAGNOSIS — E1151 Type 2 diabetes mellitus with diabetic peripheral angiopathy without gangrene: Secondary | ICD-10-CM | POA: Diagnosis not present

## 2018-08-04 DIAGNOSIS — T675XXA Heat exhaustion, unspecified, initial encounter: Secondary | ICD-10-CM | POA: Diagnosis not present

## 2018-08-04 DIAGNOSIS — E860 Dehydration: Secondary | ICD-10-CM | POA: Diagnosis not present

## 2018-08-04 DIAGNOSIS — R1110 Vomiting, unspecified: Secondary | ICD-10-CM | POA: Diagnosis not present

## 2018-08-05 DIAGNOSIS — E860 Dehydration: Secondary | ICD-10-CM | POA: Diagnosis not present

## 2018-08-05 DIAGNOSIS — Z794 Long term (current) use of insulin: Secondary | ICD-10-CM | POA: Diagnosis not present

## 2018-08-05 DIAGNOSIS — E1151 Type 2 diabetes mellitus with diabetic peripheral angiopathy without gangrene: Secondary | ICD-10-CM | POA: Diagnosis not present

## 2018-08-05 DIAGNOSIS — N179 Acute kidney failure, unspecified: Secondary | ICD-10-CM | POA: Diagnosis not present

## 2018-08-06 DIAGNOSIS — F17210 Nicotine dependence, cigarettes, uncomplicated: Secondary | ICD-10-CM | POA: Diagnosis present

## 2018-08-06 DIAGNOSIS — K219 Gastro-esophageal reflux disease without esophagitis: Secondary | ICD-10-CM | POA: Diagnosis present

## 2018-08-06 DIAGNOSIS — K449 Diaphragmatic hernia without obstruction or gangrene: Secondary | ICD-10-CM | POA: Diagnosis present

## 2018-08-06 DIAGNOSIS — Z794 Long term (current) use of insulin: Secondary | ICD-10-CM | POA: Diagnosis not present

## 2018-08-06 DIAGNOSIS — K5900 Constipation, unspecified: Secondary | ICD-10-CM | POA: Diagnosis present

## 2018-08-06 DIAGNOSIS — G8929 Other chronic pain: Secondary | ICD-10-CM | POA: Diagnosis present

## 2018-08-06 DIAGNOSIS — G4730 Sleep apnea, unspecified: Secondary | ICD-10-CM | POA: Diagnosis present

## 2018-08-06 DIAGNOSIS — E785 Hyperlipidemia, unspecified: Secondary | ICD-10-CM | POA: Diagnosis present

## 2018-08-06 DIAGNOSIS — Z7982 Long term (current) use of aspirin: Secondary | ICD-10-CM | POA: Diagnosis not present

## 2018-08-06 DIAGNOSIS — N179 Acute kidney failure, unspecified: Secondary | ICD-10-CM | POA: Diagnosis not present

## 2018-08-06 DIAGNOSIS — I10 Essential (primary) hypertension: Secondary | ICD-10-CM | POA: Diagnosis present

## 2018-08-06 DIAGNOSIS — M549 Dorsalgia, unspecified: Secondary | ICD-10-CM | POA: Diagnosis present

## 2018-08-06 DIAGNOSIS — E1151 Type 2 diabetes mellitus with diabetic peripheral angiopathy without gangrene: Secondary | ICD-10-CM | POA: Diagnosis not present

## 2018-08-06 DIAGNOSIS — Z6834 Body mass index (BMI) 34.0-34.9, adult: Secondary | ICD-10-CM | POA: Diagnosis not present

## 2018-08-06 DIAGNOSIS — I2510 Atherosclerotic heart disease of native coronary artery without angina pectoris: Secondary | ICD-10-CM | POA: Diagnosis present

## 2018-08-06 DIAGNOSIS — E860 Dehydration: Secondary | ICD-10-CM | POA: Diagnosis not present

## 2018-08-06 DIAGNOSIS — Z89411 Acquired absence of right great toe: Secondary | ICD-10-CM | POA: Diagnosis not present

## 2018-08-16 DIAGNOSIS — N179 Acute kidney failure, unspecified: Secondary | ICD-10-CM | POA: Diagnosis not present

## 2018-08-23 DIAGNOSIS — H40053 Ocular hypertension, bilateral: Secondary | ICD-10-CM | POA: Diagnosis not present

## 2018-09-06 DIAGNOSIS — S82452A Displaced comminuted fracture of shaft of left fibula, initial encounter for closed fracture: Secondary | ICD-10-CM | POA: Diagnosis not present

## 2018-09-06 DIAGNOSIS — R55 Syncope and collapse: Secondary | ICD-10-CM | POA: Diagnosis not present

## 2018-09-06 DIAGNOSIS — S92322A Displaced fracture of second metatarsal bone, left foot, initial encounter for closed fracture: Secondary | ICD-10-CM | POA: Diagnosis not present

## 2018-09-06 DIAGNOSIS — S92332A Displaced fracture of third metatarsal bone, left foot, initial encounter for closed fracture: Secondary | ICD-10-CM | POA: Diagnosis not present

## 2018-09-06 DIAGNOSIS — M25462 Effusion, left knee: Secondary | ICD-10-CM | POA: Diagnosis not present

## 2018-09-06 DIAGNOSIS — M25562 Pain in left knee: Secondary | ICD-10-CM | POA: Diagnosis not present

## 2018-09-06 DIAGNOSIS — M1712 Unilateral primary osteoarthritis, left knee: Secondary | ICD-10-CM | POA: Diagnosis not present

## 2018-09-06 DIAGNOSIS — M79672 Pain in left foot: Secondary | ICD-10-CM | POA: Diagnosis not present

## 2018-09-13 ENCOUNTER — Telehealth: Payer: Self-pay | Admitting: *Deleted

## 2018-09-13 NOTE — Telephone Encounter (Signed)
Called to offer VV but patient said he "blacked out" the other day and injured his foot. Reports seeing family doctor after this incident. Advised to keep scheduled office visit next week.

## 2018-09-18 ENCOUNTER — Other Ambulatory Visit: Payer: Self-pay

## 2018-09-18 ENCOUNTER — Encounter: Payer: Self-pay | Admitting: *Deleted

## 2018-09-18 ENCOUNTER — Encounter: Payer: Self-pay | Admitting: Cardiology

## 2018-09-18 ENCOUNTER — Ambulatory Visit (INDEPENDENT_AMBULATORY_CARE_PROVIDER_SITE_OTHER): Payer: Medicare Other | Admitting: Cardiology

## 2018-09-18 VITALS — BP 124/81 | HR 75 | Temp 97.4°F | Ht 73.0 in | Wt 292.9 lb

## 2018-09-18 DIAGNOSIS — I255 Ischemic cardiomyopathy: Secondary | ICD-10-CM

## 2018-09-18 DIAGNOSIS — I2510 Atherosclerotic heart disease of native coronary artery without angina pectoris: Secondary | ICD-10-CM

## 2018-09-18 DIAGNOSIS — R55 Syncope and collapse: Secondary | ICD-10-CM

## 2018-09-18 DIAGNOSIS — I10 Essential (primary) hypertension: Secondary | ICD-10-CM

## 2018-09-18 DIAGNOSIS — I951 Orthostatic hypotension: Secondary | ICD-10-CM

## 2018-09-18 DIAGNOSIS — I5022 Chronic systolic (congestive) heart failure: Secondary | ICD-10-CM

## 2018-09-18 NOTE — Patient Instructions (Signed)
Your physician wants you to follow-up in: 3 MONTHS WITH DR BRANCH You will receive a reminder letter in the mail two months in advance. If you don't receive a letter, please call our office to schedule the follow-up appointment.  Your physician recommends that you continue on your current medications as directed. Please refer to the Current Medication list given to you today.  Thank you for choosing [facility]!!    

## 2018-09-18 NOTE — Progress Notes (Signed)
Clinical Summary [name] is a 48 y.o.male seen today for follow up of the following medical problems.  1. Chronic systolic HF/ICM/CAD - 05/2017 nuclear stress: Large fixed defects anterior, lateral, inferior myocardium. NO reversibility. LVEF 17%. - 07/2017 echo LVEF 30-35%  07/2017 cath LAD 25%, ramus intermedius 50%, LCX occluded, RCA occluded fills by collaterals.  RHC mean PA 33, PCWP 22, CI 2.1 - from cath note could consider PCI to CTO RCA and LCX if myocardium viable. Patient never followed up in clinic after cath.     04/2018 echo LVEF 30-35%, grade II diastolic dysfunction - poor compliance with meds. No recent chest pain or SOB. Recent weight gain he attributes to poor dietary habits, denies any edema.   2. HTN - mixed compliance with meds  3. Orthostatic dizziness -recent syncope as reported below  4. Foot sore/Osteomyelitis - normal ABIs 08/2017 - s/p amputation of great toe, has healed.  - followed by wound care  5. Syncope - orthostatics are negative in clinic today  - first episode about 1 week ago while at home. Got up and took a few steps, became very dizzy and fell to ground. Unsure how long he was out, he thinks short period of time. Got up and walked to bed, feeling light headed. Stood up again and walked to the door again, repeat episode. Got back in bed and slept for some time. No repeat since. Has small AC at his home, his apartment stays very hot particularly that weekend. No N/VD. Working to stay hydrated. No EtoH, no significant caffeine intake.    - admission last month for AKI while outside in the heat, had some N/V. Found to be hypotensive - multiple admits in the past for dehydrations     SH: has 3 pitbulls at home Past Medical History:  Diagnosis Date  . Angina pectoris with documented spasm (HCC)   . Corns and callosities   . Diabetes mellitus without complication (HCC)   . Hyperlipidemia   . Hypertension   . Other  sleep apnea   . Pancreatitis      No Known Allergies   Current Outpatient Medications  Medication Sig Dispense Refill  . aspirin EC 81 MG tablet Take 1 tablet (81 mg total) by mouth daily. 90 tablet 3  . FLUoxetine (PROZAC) 20 MG tablet Take 20 mg by mouth daily.    Marland Kitchen. gabapentin (NEURONTIN) 300 MG capsule Take 300 mg by mouth 4 (four) times daily.    Marland Kitchen. LEVEMIR FLEXTOUCH 100 UNIT/ML Pen Inject 30 Units into the skin 2 (two) times daily. Dependant upon blood sugar level  2  . lisinopril (PRINIVIL,ZESTRIL) 20 MG tablet Take 1 tablet (20 mg total) by mouth daily. 30 tablet 0  . metFORMIN (GLUCOPHAGE) 1000 MG tablet Take 1,000 mg by mouth 2 (two) times daily with a meal.    . metoprolol succinate (TOPROL-XL) 50 MG 24 hr tablet TAKE ONE TABLET BY MOUTH DAILY. TAKE WITH OR IMMEDIATELY FOLLOWING A MEAL. 90 tablet 1  . simvastatin (ZOCOR) 20 MG tablet Take 20 mg by mouth daily.     No current facility-administered medications for this visit.      Past Surgical History:  Procedure Laterality Date  . APPENDECTOMY  2016  . OTHER SURGICAL HISTORY  2016  . RIGHT/LEFT HEART CATH AND CORONARY ANGIOGRAPHY N/A 08/08/2017   Procedure: RIGHT/LEFT HEART CATH AND CORONARY ANGIOGRAPHY;  Surgeon: Corky CraftsVaranasi, Jayadeep S, MD;  [facility];  Service: Cardiovascular;  Laterality: N/A;     No Known Allergies    Family History  Problem Relation Age of Onset  . Aneurysm Father      Social History [name] reports that he quit smoking about 18 months ago. His smoking use included e-cigarettes. He has never used smokeless tobacco. [name] reports no history of alcohol use.   Review of Systems CONSTITUTIONAL: No weight loss, fever, chills, weakness or fatigue.  HEENT: Eyes: No visual loss, blurred vision, double vision or yellow sclerae.No hearing loss, sneezing, congestion, runny nose or sore throat.  SKIN: No rash or itching.  CARDIOVASCULAR: per hpi RESPIRATORY: No  shortness of breath, cough or sputum.  GASTROINTESTINAL: No anorexia, nausea, vomiting or diarrhea. No abdominal pain or blood.  GENITOURINARY: No burning on urination, no polyuria NEUROLOGICAL: per hpi MUSCULOSKELETAL: No muscle, back pain, joint pain or stiffness.  LYMPHATICS: No enlarged nodes. No history of splenectomy.  PSYCHIATRIC: No history of depression or anxiety.  ENDOCRINOLOGIC: No reports of sweating, cold or heat intolerance. No polyuria or polydipsia.  Marland Kitchen   Physical Examination Today's Vitals   09/18/18 0847  BP: 124/81  Pulse: 75  Temp: (!) 97.4 F (36.3 C)  SpO2: 100%  Weight: 292 lb 14.4 oz (132.9 kg)  Height: 6\' 1"  (1.854 m)   Body mass index is 38.64 kg/m.  Gen: resting comfortably, no acute distress HEENT: no scleral icterus, pupils equal round and reactive, no palptable cervical adenopathy,  CV: RRR, no mr/g, no jvd Resp: Clear to auscultation bilaterally GI: abdomen is soft, non-tender, non-distended, normal bowel sounds, no hepatosplenomegaly MSK: extremities are warm, no edema.  Skin: warm, no rash Neuro:  no focal deficits Psych: appropriate affect   Diagnostic Studies  07/2017 echo LVEF 30-16%, grade I diastolic dysfunction, multiple WMAs, mild MR, mild RV dysfunction  07/2017 cath  Mid RCA lesion is 100% stenosed. Vessel fills by collaterals.  Prox Cx to Mid Cx lesion is 100% stenosed. Vesel fills by left to left collaterals.  Ramus lesion is 50% stenosed.  Mid LAD lesion is 25% stenosed.  LV end diastolic pressure is moderately elevated.  There is no aortic valve stenosis.  Hemodynamic findings consistent with mild pulmonary hypertension.  Ao sat: 96%, PA sat 63%; CO: 5.4 L.min; CI: 2.1   Recommend Aspirin 81mg  daily for moderate CAD.Consider CTO PCI attempt if the inferior and lateral myocardium is viable, after his foot ulcer has been addressed.    08/2017 ABI Final Interpretation: Right: Resting right ankle-brachial  index is within normal range. No evidence of significant right lower extremity arterial disease. The right toe-brachial index is normal.  Left: Resting left ankle-brachial index is within normal range. No evidence of significant left lower extremity arterial disease. The left toe-brachial index is normal. PPG tracings appear dampened.  04/2018 echo IMPRESSIONS    1. The left ventricle has moderate-severely reduced systolic function, with an ejection fraction of 30-35%. The cavity size was moderately dilated. Left ventricular diastolic Doppler parameters are consistent with pseudonormalization.  2. There is akinesis of the left ventricular, basal-mid inferior wall and lateral wall.  3. The right ventricle has normal systolic function. The cavity was normal. There is no increase in right ventricular wall thickness. Right ventricular systolic pressure could not be assessed.  4. The aortic valve is tricuspid. Mild to moderate aortic annular calcification noted.  5. The mitral valve is grossly normal. There is mild mitral annular calcification present. There is calcification of the  posteromedial papillary muscle and apparatus.  6. The tricuspid valve is grossly normal.  7. The aortic root is normal in size and structure.  8. The interatrial septum was not assessed.  Assessment and Plan  1.Chronic systolic HF/ICM/CAD - management has been limited due to mixed compliance with meds and follow. Titration of meds also limited by orthostastic symptoms - continue current meds. No signs of fluid overload   2. HTN - at goal, continue current meds   3. Syncope - describes 2 episodes of very classic orthostatic syncope. Has had prior chronic orthostatic symptoms in the past but not syncope. He reports multiple prior admissions with dehydration including one last month to [facility] These episodes occurred while in hot apartment all weekend, both episodes right after standing. Nothing by history  would suggest an arrhythmia as the etiology.  - encouraged increased hydration, asked to notify us if recurrent episodes. Certaintly given his cardiac history of episodes change or are more suggestive of an arrhythmia would plan further workup.    - had not followed up with [place] for several months after his cath - no current symtoms - repeat echo to see where his LVEF is. If continues to be low change loprsssor back to [place], consider changing lisinopril to entresto. Likely would work to optimize medical therapy prior to consider viability testing or PCI of his RCA and LCX CTO, particualrly given his lack of symptoms - no further titration fo CHF meds at this time.  - EKG today shows SR   F/u 3 months   [name], M.D.

## 2018-09-19 DIAGNOSIS — E1143 Type 2 diabetes mellitus with diabetic autonomic (poly)neuropathy: Secondary | ICD-10-CM | POA: Diagnosis not present

## 2018-09-19 DIAGNOSIS — I10 Essential (primary) hypertension: Secondary | ICD-10-CM | POA: Diagnosis not present

## 2018-09-19 DIAGNOSIS — E782 Mixed hyperlipidemia: Secondary | ICD-10-CM | POA: Diagnosis not present

## 2018-09-20 ENCOUNTER — Ambulatory Visit (INDEPENDENT_AMBULATORY_CARE_PROVIDER_SITE_OTHER): Payer: Medicare Other | Admitting: Orthopaedic Surgery

## 2018-09-20 ENCOUNTER — Other Ambulatory Visit: Payer: Self-pay

## 2018-09-20 ENCOUNTER — Encounter: Payer: Self-pay | Admitting: Orthopaedic Surgery

## 2018-09-20 DIAGNOSIS — S92302A Fracture of unspecified metatarsal bone(s), left foot, initial encounter for closed fracture: Secondary | ICD-10-CM | POA: Diagnosis not present

## 2018-09-20 DIAGNOSIS — I255 Ischemic cardiomyopathy: Secondary | ICD-10-CM | POA: Diagnosis not present

## 2018-09-20 NOTE — Progress Notes (Signed)
Office Visit Note   Patient: [name]           Date of Birth: 1970/09/20           MRN: [id number] Visit Date: 09/20/2018              Requested by: Toma DeitersHasanaj, Xaje A, MD [address] [place],  [place] [id number] PCP: Toma DeitersHasanaj, Xaje A, MD   Assessment & Plan: Visit Diagnoses:  1. Multiple closed fractures of metatarsal bone of left foot, initial encounter     Plan: Patient had considerable swelling of the left foot over the midfoot but no pain likely related to his neuropathy.  We discussed limited weightbearing elevation rigid soled shoe and continued diabetic monitoring.  He needs to proceed with cardiac recommendations for treatment prevent further syncopal episodes and falls with potential for fracture.  Follow-Up Instructions: No follow-ups on file.   Orders:  No orders of the defined types were placed in this encounter.  No orders of the defined types were placed in this encounter.     Procedures: No procedures performed   Clinical Data: No additional findings.   Subjective: No chief complaint on file.   HPI 48 year old seen with acute left foot injury that occurred when patient blacked out twice in the same day injuring the foot.  Patient does have severe neuropathy has been treated with gabapentin.  Low ejection fraction originally in May 17% that improved by July 30 to 35%.  Patient was recommended cardiac catheterization with possible intervention but never followed up with cardiology.  Review of Systems   Objective: Vital Signs: BP (!) 134/91   Pulse (!) 119   Ht 6\' 1"  (1.854 m)   Wt 292 lb (132.5 kg)   BMI 38.52 kg/m   Physical Exam Constitutional:      Appearance: He is well-developed.  HENT:     Head: Normocephalic and atraumatic.  Eyes:     Pupils: Pupils are equal, round, and reactive to light.  Neck:     Thyroid: No thyromegaly.     Trachea: No tracheal deviation.  Cardiovascular:     Rate and Rhythm: Normal rate.   Pulmonary:     Effort: Pulmonary effort is normal.     Breath sounds: No wheezing.  Abdominal:     General: Bowel sounds are normal.     Palpations: Abdomen is soft.  Skin:    General: Skin is warm and dry.     Capillary Refill: Capillary refill takes less than 2 seconds.  Neurological:     Mental Status: He is alert and oriented to person, place, and time.  Psychiatric:        Behavior: Behavior normal.        Thought Content: Thought content normal.        Judgment: Judgment normal.     Ortho Exam decreased sensation stocking distribution right and left.  Dorsal left foot swelling over the second third metatarsals with intact skin no plantar foot lesions.  Small callus over the third metatarsal which is thin.  Specialty Comments:  No specialty comments available.  Imaging: X-rays 09/06/2018 shows distal metatarsal fractures left second and third with slight displacement.  No pathologic lesions noted.   PMFS History: Patient Active Problem List   Diagnosis Date Noted  . Multiple closed fractures of metatarsal bone of left foot 09/25/2018  . Chronic systolic heart failure (HCC)    Past Medical History:  Diagnosis Date  . Angina  pectoris with documented spasm ([place])   . Corns and callosities   . Diabetes mellitus without complication ([place])   . Hyperlipidemia   . Hypertension   . Other sleep apnea   . Pancreatitis     Family History  Problem Relation Age of Onset  . Aneurysm Father     Past Surgical History:  Procedure Laterality Date  . APPENDECTOMY  2016  . OTHER SURGICAL HISTORY  2016  . RIGHT/LEFT HEART CATH AND CORONARY ANGIOGRAPHY N/A 08/08/2017   Procedure: RIGHT/LEFT HEART CATH AND CORONARY ANGIOGRAPHY;  Surgeon: Jettie Booze, MD;  Location: [place] [facility];  Service: Cardiovascular;  Laterality: N/A;   Social History   Occupational History  . Not on file  Tobacco Use  . Smoking status: Former Smoker    Types: E-cigarettes    Quit date:  03/05/2017    Years since quitting: 1.5  . Smokeless tobacco: Never Used  . Tobacco comment: no cigarettes since 02/2017  Substance and Sexual Activity  . Alcohol use: Never    Frequency: Never  . Drug use: Never  . Sexual activity: Not on file

## 2018-09-25 DIAGNOSIS — S92302A Fracture of unspecified metatarsal bone(s), left foot, initial encounter for closed fracture: Secondary | ICD-10-CM | POA: Insufficient documentation

## 2018-10-19 DIAGNOSIS — I10 Essential (primary) hypertension: Secondary | ICD-10-CM | POA: Diagnosis not present

## 2018-10-19 DIAGNOSIS — E782 Mixed hyperlipidemia: Secondary | ICD-10-CM | POA: Diagnosis not present

## 2018-10-19 DIAGNOSIS — E1143 Type 2 diabetes mellitus with diabetic autonomic (poly)neuropathy: Secondary | ICD-10-CM | POA: Diagnosis not present

## 2018-11-21 DIAGNOSIS — E1143 Type 2 diabetes mellitus with diabetic autonomic (poly)neuropathy: Secondary | ICD-10-CM | POA: Diagnosis not present

## 2018-11-21 DIAGNOSIS — E782 Mixed hyperlipidemia: Secondary | ICD-10-CM | POA: Diagnosis not present

## 2018-11-21 DIAGNOSIS — I10 Essential (primary) hypertension: Secondary | ICD-10-CM | POA: Diagnosis not present

## 2018-12-10 DIAGNOSIS — Z1389 Encounter for screening for other disorder: Secondary | ICD-10-CM | POA: Diagnosis not present

## 2018-12-10 DIAGNOSIS — E7849 Other hyperlipidemia: Secondary | ICD-10-CM | POA: Diagnosis not present

## 2018-12-10 DIAGNOSIS — Z6841 Body Mass Index (BMI) 40.0 and over, adult: Secondary | ICD-10-CM | POA: Diagnosis not present

## 2018-12-10 DIAGNOSIS — E119 Type 2 diabetes mellitus without complications: Secondary | ICD-10-CM | POA: Diagnosis not present

## 2018-12-10 DIAGNOSIS — R55 Syncope and collapse: Secondary | ICD-10-CM | POA: Diagnosis not present

## 2018-12-10 DIAGNOSIS — Z0000 Encounter for general adult medical examination without abnormal findings: Secondary | ICD-10-CM | POA: Diagnosis not present

## 2018-12-10 DIAGNOSIS — I10 Essential (primary) hypertension: Secondary | ICD-10-CM | POA: Diagnosis not present

## 2018-12-10 DIAGNOSIS — E1165 Type 2 diabetes mellitus with hyperglycemia: Secondary | ICD-10-CM | POA: Diagnosis not present

## 2018-12-24 ENCOUNTER — Other Ambulatory Visit: Payer: Self-pay

## 2018-12-24 ENCOUNTER — Telehealth: Payer: Self-pay | Admitting: Licensed Clinical Social Worker

## 2018-12-24 ENCOUNTER — Ambulatory Visit (INDEPENDENT_AMBULATORY_CARE_PROVIDER_SITE_OTHER): Payer: Medicare PPO | Admitting: Cardiology

## 2018-12-24 ENCOUNTER — Encounter: Payer: Self-pay | Admitting: Cardiology

## 2018-12-24 ENCOUNTER — Telehealth: Payer: Self-pay | Admitting: *Deleted

## 2018-12-24 VITALS — Ht 72.0 in | Wt 296.0 lb

## 2018-12-24 DIAGNOSIS — I5022 Chronic systolic (congestive) heart failure: Secondary | ICD-10-CM | POA: Diagnosis not present

## 2018-12-24 DIAGNOSIS — I2510 Atherosclerotic heart disease of native coronary artery without angina pectoris: Secondary | ICD-10-CM | POA: Diagnosis not present

## 2018-12-24 DIAGNOSIS — I10 Essential (primary) hypertension: Secondary | ICD-10-CM | POA: Diagnosis not present

## 2018-12-24 MED ORDER — ENTRESTO 24-26 MG PO TABS: 1.0000 | ORAL_TABLET | Freq: Two times a day (BID) | ORAL | 6 refills | Status: DC

## 2018-12-24 NOTE — Telephone Encounter (Signed)
The patient verbally consented for a telehealth phone visit with [facility] and understands that his/her insurance company will be billed for the encounter.  

## 2018-12-24 NOTE — Telephone Encounter (Signed)
CSW referred to assist patient with obtaining a BP cuff. CSW contacted patient to inform cuff will be delivered to home. Message left. CSW available as needed. Jackie Evelise Reine, LCSW, CCSW-MCS [phone]  

## 2018-12-24 NOTE — Progress Notes (Signed)
Virtual Visit via Telephone Note   This visit type was conducted due to national recommendations for restrictions regarding the COVID-19 Pandemic (e.g. social distancing) in an effort to limit this patient's exposure and mitigate transmission in our community.  Due to his co-morbid illnesses, this patient is at least at moderate risk for complications without adequate follow up.  This format is felt to be most appropriate for this patient at this time.  The patient did not have access to video technology/had technical difficulties with video requiring transitioning to audio format only (telephone).  All issues noted in this document were discussed and addressed.  No physical exam could be performed with this format.  Please refer to the patient's chart for his  consent to telehealth for [facility].   Date:  12/24/2018   ID:  Durenda Age III, DOB [birth year], MRN [id number]  Patient Location: Home Provider Location: Office  PCP:  Neale Burly, MD  Cardiologist:  Carlyle Dolly, MD  Electrophysiologist:  None   Evaluation Performed:  Follow-Up Visit  Chief Complaint:  Follow up visit  History of Present Illness:    Espiridion Supinski III is a 48 y.o. male seen today for follow up of the following medical problems.  1.Chronic systolic HF/ICM/CAD - 0/1093 nuclear stress: Large fixed defects anterior, lateral, inferior myocardium. NO reversibility. LVEF 17%. - 07/2017 echo LVEF 30-35%  07/2017 cath LAD 25%, ramus intermedius 50%, LCX occluded, RCA occluded fills by collaterals.  RHC mean PA 33, PCWP 22, CI 2.1 - from cath note could consider PCI to CTO RCA and LCX if myocardium viable. Patient never followed up in clinic after cath.    04/2018 echo LVEF 30-35%, grade II diastolic dysfunction - no recent SOB/DOE. No recent edema. Home weight 296, up from 292 - good compliance with meds since last visti per report, prevously had poor compliance   2. HTN  -compliant with meds   3. Foot sore/Osteomyelitis - normal ABIs 08/2017 - s/p amputation of great toe, has healed. - followed by wound care  4. Syncope -prior episodes of syncope.  first episode about 1 week ago while at home. Got up and took a few steps, became very dizzy and fell to ground. Unsure how long he was out, he thinks short period of time. Got up and walked to bed, feeling light headed. Stood up again and walked to the door again, repeat episode. Got back in bed and slept for some time. No repeat since. Has small AC at his home, his apartment stays very hot particularly that weekend. No N/VD. Working to stay hydrated. No EtoH, no significant caffeine intake.    - admission last month for AKI while outside in the heat, had some N/V. Found to be hypotensive - multiple admits in the past for dehydrations  - no recent issues  SH: has 3 pitbulls at home     The patient does not have symptoms concerning for COVID-19 infection (fever, chills, cough, or new shortness of breath).    Past Medical History:  Diagnosis Date  . Angina pectoris with documented spasm ([place])   . Corns and callosities   . Diabetes mellitus without complication ([place])   . Hyperlipidemia   . Hypertension   . Other sleep apnea   . Pancreatitis    Past Surgical History:  Procedure Laterality Date  . APPENDECTOMY  2016  . OTHER SURGICAL HISTORY  2016  . RIGHT/LEFT HEART CATH AND CORONARY ANGIOGRAPHY N/A 08/08/2017   Procedure:  RIGHT/LEFT HEART CATH AND CORONARY ANGIOGRAPHY;  Surgeon: [name], MD;  Location: [facility] LAB;  Service: Cardiovascular;  Laterality: N/A;     Current Meds  Medication Sig  . aspirin EC 81 MG tablet Take 1 tablet (81 mg total) by mouth daily.  . DULoxetine (CYMBALTA) 60 MG capsule Take 60 mg by mouth daily.   Marland Kitchen FLUoxetine (PROZAC) 20 MG capsule Take 20 mg by mouth every morning.  . gabapentin (NEURONTIN) 300 MG capsule Take 300 mg by mouth 4 (four)  times daily.  . Insulin Pen Needle (EXEL COMFORT POINT PEN NEEDLE) 31G X 6 MM MISC USE ONCE DAILY TO INJECT INSULIN  . LEVEMIR FLEXTOUCH 100 UNIT/ML Pen Inject 30 Units into the skin 2 (two) times daily. Dependant upon blood sugar level  . lisinopril-hydrochlorothiazide (ZESTORETIC) 20-25 MG tablet Take 1 tablet by mouth daily.  . metFORMIN (GLUCOPHAGE) 1000 MG tablet Take 1,000 mg by mouth 2 (two) times daily with a meal.  . metoprolol succinate (TOPROL-XL) 50 MG 24 hr tablet TAKE ONE TABLET BY MOUTH DAILY. TAKE WITH OR IMMEDIATELY FOLLOWING A MEAL.  Marland Kitchen Omega-3 Fatty Acids (FISH OIL) 1000 MG CAPS Take 1 capsule by mouth 3 (three) times daily.  . simvastatin (ZOCOR) 20 MG tablet Take 20 mg by mouth daily.     Allergies:   Patient has no known allergies.   Social History   Tobacco Use  . Smoking status: Former Smoker    Types: E-cigarettes    Quit date: 03/05/2017    Years since quitting: 1.8  . Smokeless tobacco: Never Used  . Tobacco comment: no cigarettes since 02/2017  Substance Use Topics  . Alcohol use: Never    Frequency: Never  . Drug use: Never     Family Hx: The patient's family history includes Aneurysm in his father.  ROS:   Please see the history of present illness.     All other systems reviewed and are negative.   Prior CV studies:   The following studies were reviewed today:  07/2017 echo LVEF 30-35%, grade I diastolic dysfunction, multiple WMAs, mild MR, mild RV dysfunction  07/2017 cath  Mid RCA lesion is 100% stenosed. Vessel fills by collaterals.  Prox Cx to Mid Cx lesion is 100% stenosed. Vesel fills by left to left collaterals.  Ramus lesion is 50% stenosed.  Mid LAD lesion is 25% stenosed.  LV end diastolic pressure is moderately elevated.  There is no aortic valve stenosis.  Hemodynamic findings consistent with mild pulmonary hypertension.  Ao sat: 96%, PA sat 63%; CO: 5.4 L.min; CI: 2.1   Recommend Aspirin 81mg  daily for moderate  CAD.Consider CTO PCI attempt if the inferior and lateral myocardium is viable, after his foot ulcer has been addressed.   08/2017 ABI Final Interpretation: Right: Resting right ankle-brachial index is within normal range. No evidence of significant right lower extremity arterial disease. The right toe-brachial index is normal.  Left: Resting left ankle-brachial index is within normal range. No evidence of significant left lower extremity arterial disease. The left toe-brachial index is normal. PPG tracings appear dampened.  04/2018 echo IMPRESSIONS   1. The left ventricle has moderate-severely reduced systolic function, with an ejection fraction of 30-35%. The cavity size was moderately dilated. Left ventricular diastolic Doppler parameters are consistent with pseudonormalization. 2. There is akinesis of the left ventricular, basal-mid inferior wall and lateral wall. 3. The right ventricle has normal systolic function. The cavity was normal. There is no increase in  right ventricular wall thickness. Right ventricular systolic pressure could not be assessed. 4. The aortic valve is tricuspid. Mild to moderate aortic annular calcification noted. 5. The mitral valve is grossly normal. There is mild mitral annular calcification present. There is calcification of the posteromedial papillary muscle and apparatus. 6. The tricuspid valve is grossly normal. 7. The aortic root is normal in size and structure. 8. The interatrial septum was not assessed.   Labs/Other Tests and Data Reviewed:    EKG:  No ECG reviewed.  Recent Labs: No results found for requested labs within last 8760 hours.   Recent Lipid Panel No results found for: CHOL, TRIG, HDL, CHOLHDL, LDLCALC, LDLDIRECT  Wt Readings from Last 3 Encounters:  12/24/18 296 lb (134.3 kg)  09/20/18 292 lb (132.5 kg)  09/18/18 292 lb 14.4 oz (132.9 kg)     Objective:    Vital Signs:  Ht 6' (1.829 m)   Wt 296 lb (134.3 kg)    BMI 40.14 kg/m    Normal affect. Normal speech pattern and tone. Comfortable, no apparent distress. No audible signs of SOB or wheezing.   ASSESSMENT & PLAN:    1.Chronic systolic HF/ICM/CAD - management has been limited due to mixed compliance with meds and follow up. Titration of meds also limited by orthostastic symptoms - reports recent compliance. We will d/c his lisinopril/hctz, start entresto 24/26mg  bid - titrate meds as toelrated over follow ups - would look to change his statin in near future to more potent statin given his CAD. With issues with compliance and following his regimen I don/t want to make too many changes at once.    2. HTN - changing to entresto as reported above.    3. Orthostatic syncope - no recent issues    COVID-19 Education: The signs and symptoms of COVID-19 were discussed with the patient and how to seek care for testing (follow up with PCP or arrange E-visit).  The importance of social distancing was discussed today.  Time:   Today, I have spent 15 minutes with the patient with telehealth technology discussing the above problems.     Medication Adjustments/Labs and Tests Ordered: Current medicines are reviewed at length with the patient today.  Concerns regarding medicines are outlined above.   Tests Ordered: No orders of the defined types were placed in this encounter.   Medication Changes: No orders of the defined types were placed in this encounter.   Follow Up:  In Person in 3 week(s)  Signed, Dina RichBranch, Gehrig Patras, MD  12/24/2018 12:48 PM    [facility]

## 2018-12-24 NOTE — Patient Instructions (Addendum)
Medication Instructions:   Your physician has recommended you make the following change in your medication:   Stop lisinopril/hydrochlorothiazide  On December 27, 2018, start entresto 24/26 one by mouth twice daily. You will get the first 30 days free  Continue other medications the same  Labwork:  NONE  Testing/Procedures:  NONE  Follow-Up:  Your physician recommends that you schedule a follow-up appointment in: 3 weeks at the office.  Any Other Special Instructions Will Be Listed Below (If Applicable).   We are checking on getting a blood pressure monitor for you.  If you need a refill on your cardiac medications before your next appointment, please call your pharmacy.

## 2018-12-28 DIAGNOSIS — E119 Type 2 diabetes mellitus without complications: Secondary | ICD-10-CM | POA: Diagnosis not present

## 2018-12-28 DIAGNOSIS — E7849 Other hyperlipidemia: Secondary | ICD-10-CM | POA: Diagnosis not present

## 2018-12-28 DIAGNOSIS — I10 Essential (primary) hypertension: Secondary | ICD-10-CM | POA: Diagnosis not present

## 2019-01-17 ENCOUNTER — Ambulatory Visit: Payer: Medicare PPO | Admitting: Student

## 2019-01-25 ENCOUNTER — Encounter: Payer: Medicare PPO | Admitting: Student

## 2019-01-25 DIAGNOSIS — Z794 Long term (current) use of insulin: Secondary | ICD-10-CM | POA: Diagnosis not present

## 2019-01-25 DIAGNOSIS — E7800 Pure hypercholesterolemia, unspecified: Secondary | ICD-10-CM | POA: Diagnosis not present

## 2019-01-25 DIAGNOSIS — L089 Local infection of the skin and subcutaneous tissue, unspecified: Secondary | ICD-10-CM | POA: Diagnosis not present

## 2019-01-25 DIAGNOSIS — E119 Type 2 diabetes mellitus without complications: Secondary | ICD-10-CM | POA: Diagnosis not present

## 2019-01-25 DIAGNOSIS — F17290 Nicotine dependence, other tobacco product, uncomplicated: Secondary | ICD-10-CM | POA: Diagnosis not present

## 2019-01-25 DIAGNOSIS — M79672 Pain in left foot: Secondary | ICD-10-CM | POA: Diagnosis not present

## 2019-01-25 DIAGNOSIS — S92322A Displaced fracture of second metatarsal bone, left foot, initial encounter for closed fracture: Secondary | ICD-10-CM | POA: Diagnosis not present

## 2019-01-25 DIAGNOSIS — E11621 Type 2 diabetes mellitus with foot ulcer: Secondary | ICD-10-CM | POA: Diagnosis not present

## 2019-01-25 DIAGNOSIS — Z89411 Acquired absence of right great toe: Secondary | ICD-10-CM | POA: Diagnosis not present

## 2019-01-25 DIAGNOSIS — K219 Gastro-esophageal reflux disease without esophagitis: Secondary | ICD-10-CM | POA: Diagnosis not present

## 2019-01-25 DIAGNOSIS — E7849 Other hyperlipidemia: Secondary | ICD-10-CM | POA: Diagnosis not present

## 2019-01-25 DIAGNOSIS — Z79899 Other long term (current) drug therapy: Secondary | ICD-10-CM | POA: Diagnosis not present

## 2019-01-25 DIAGNOSIS — I10 Essential (primary) hypertension: Secondary | ICD-10-CM | POA: Diagnosis not present

## 2019-01-25 NOTE — Progress Notes (Signed)
This encounter was created in error - please disregard.

## 2019-01-29 ENCOUNTER — Telehealth (INDEPENDENT_AMBULATORY_CARE_PROVIDER_SITE_OTHER): Payer: Medicare PPO | Admitting: Student

## 2019-01-29 ENCOUNTER — Encounter: Payer: Self-pay | Admitting: Student

## 2019-01-29 VITALS — BP 111/78 | HR 95 | Ht 72.0 in | Wt 296.0 lb

## 2019-01-29 DIAGNOSIS — I2510 Atherosclerotic heart disease of native coronary artery without angina pectoris: Secondary | ICD-10-CM

## 2019-01-29 DIAGNOSIS — I10 Essential (primary) hypertension: Secondary | ICD-10-CM

## 2019-01-29 DIAGNOSIS — E785 Hyperlipidemia, unspecified: Secondary | ICD-10-CM

## 2019-01-29 DIAGNOSIS — Z8739 Personal history of other diseases of the musculoskeletal system and connective tissue: Secondary | ICD-10-CM | POA: Diagnosis not present

## 2019-01-29 DIAGNOSIS — I5022 Chronic systolic (congestive) heart failure: Secondary | ICD-10-CM | POA: Diagnosis not present

## 2019-01-29 MED ORDER — METOPROLOL SUCCINATE ER 50 MG PO TB24: ORAL_TABLET | ORAL | 3 refills | Status: DC

## 2019-01-29 NOTE — Patient Instructions (Signed)
Medication Instructions:  Your physician recommends that you continue on your current medications as directed. Please refer to the Current Medication list given to you today.   Labwork: NONE  Testing/Procedures: NONE  Follow-Up: Your physician recommends that you schedule a follow-up appointment in: 3 MONTHS    Any Other Special Instructions Will Be Listed Below (If Applicable).   PLEASE KEEP A BLOOD PRESSURE LOG AND TAKE IT TO YOUR NEXT VISIT   If you need a refill on your cardiac medications before your next appointment, please call your pharmacy.

## 2019-01-29 NOTE — Progress Notes (Signed)
Virtual Visit via Telephone Note   This visit type was conducted due to national recommendations for restrictions regarding the COVID-19 Pandemic (e.g. social distancing) in an effort to limit this patient's exposure and mitigate transmission in our community.  Due to his co-morbid illnesses, this patient is at least at moderate risk for complications without adequate follow up.  This format is felt to be most appropriate for this patient at this time.  The patient did not have access to video technology/had technical difficulties with video requiring transitioning to audio format only (telephone).  All issues noted in this document were discussed and addressed.  No physical exam could be performed with this format.  Please refer to the patient's chart for his  consent to telehealth for [facility].   Date:  01/29/2019   ID:  [name], DOB 1970-02-07, MRN [id number]  Patient Location: Home Provider Location: Office  PCP:  Toma Deiters, MD  Cardiologist:  Dina Rich, MD  Electrophysiologist:  None   Evaluation Performed:  Follow-Up Visit  Chief Complaint:  105-month visit  History of Present Illness:    [name] is a 49 y.o. male with past medical history of chronic systolic CHF/ICM (EF 30-35% in 07/2017, similar results by repeat echo in 04/2018), CAD (cath in 07/2017 showing occluded LCx and occluded RCA filled by collaterals with consideration of PCI to CTO RCA and LCx if myocardium viable), HTN, HLD, and history of osteomyelitis who presents for a 1 month follow-up telehealth visit.    He most recently had an office visit with [name] in 12/2018 and denied any recent chest pain or dyspnea on exertion. He did report having an episode of syncope approximately 1 week prior to his visit and is unaware of any associated symptoms. Titration of medical therapy had been limited secondary to noncompliance and orthostatic symptoms but an attempt was made to  discontinue his Lisinopril/HCTZ and start Entresto 24-26mg  BID.    In talking with the patient today, he reports he just moved to a new home earlier today and is excited about the change as his current place has central heat which was not available at his prior home. He did have help with moving furniture and household goods. He denies any recent chest pain or dyspnea on exertion. No recent orthopnea, PND or lower extremity edema.  He initially had fatigue after starting Entresto but reports this has now resolved. He did check his BP while on the phone today and it was well controlled at 111/78. He denies any lightheadedness, dizziness or presyncope.  The patient does not have symptoms concerning for COVID-19 infection (fever, chills, cough, or new shortness of breath).    Past Medical History:  Diagnosis Date  . Angina pectoris with documented spasm (HCC)   . CAD (coronary artery disease)    a. cath in 07/2017 showing occluded LCx and occluded RCA filled by collaterals with consideration of PCI to CTO RCA and LCx if myocardium viable --> did not follow-up after cath  . CHF (congestive heart failure) (HCC)    a. EF 30-35% in 07/2017 b. similar results by repeat echo in 04/2018  . Corns and callosities   . Diabetes mellitus without complication (HCC)   . Hyperlipidemia   . Hypertension   . Other sleep apnea   . Pancreatitis    Past Surgical History:  Procedure Laterality Date  . APPENDECTOMY  2016  . OTHER SURGICAL HISTORY  2016  . RIGHT/LEFT HEART  CATH AND CORONARY ANGIOGRAPHY N/A 08/08/2017   Procedure: RIGHT/LEFT HEART CATH AND CORONARY ANGIOGRAPHY;  Surgeon: Jettie Booze, MD;  Location: [place] [facility];  Service: Cardiovascular;  Laterality: N/A;     Current Meds  Medication Sig  . aspirin EC 81 MG tablet Take 1 tablet (81 mg total) by mouth daily.  . DULoxetine (CYMBALTA) 60 MG capsule Take 60 mg by mouth daily.   Marland Kitchen FLUoxetine (PROZAC) 20 MG capsule Take 20 mg by  mouth every morning.  . gabapentin (NEURONTIN) 300 MG capsule Take 300 mg by mouth 4 (four) times daily.  . Insulin Pen Needle (EXEL COMFORT POINT PEN NEEDLE) 31G X 6 MM MISC USE ONCE DAILY TO INJECT INSULIN  . LEVEMIR FLEXTOUCH 100 UNIT/ML Pen Inject 30 Units into the skin 2 (two) times daily. Dependant upon blood sugar level  . metFORMIN (GLUCOPHAGE) 1000 MG tablet Take 1,000 mg by mouth 2 (two) times daily with a meal.  . metoprolol succinate (TOPROL-XL) 50 MG 24 hr tablet TAKE ONE TABLET BY MOUTH DAILY. TAKE WITH OR IMMEDIATELY FOLLOWING A MEAL.  Marland Kitchen Omega-3 Fatty Acids (FISH OIL) 1000 MG CAPS Take 1 capsule by mouth 3 (three) times daily.  . sacubitril-valsartan (ENTRESTO) 24-26 MG Take 1 tablet by mouth 2 (two) times daily.  . simvastatin (ZOCOR) 20 MG tablet Take 20 mg by mouth daily.  . [DISCONTINUED] metoprolol succinate (TOPROL-XL) 50 MG 24 hr tablet TAKE ONE TABLET BY MOUTH DAILY. TAKE WITH OR IMMEDIATELY FOLLOWING A MEAL.     Allergies:   Patient has no known allergies.   Social History   Tobacco Use  . Smoking status: Former Smoker    Types: E-cigarettes    Quit date: 03/05/2017    Years since quitting: 1.9  . Smokeless tobacco: Never Used  . Tobacco comment: no cigarettes since 02/2017  Substance Use Topics  . Alcohol use: Never  . Drug use: Never     Family Hx: The patient's family history includes Aneurysm in his father.  ROS:   Please see the history of present illness.     All other systems reviewed and are negative.   Prior CV studies:   The following studies were reviewed today:  Cardiac Catheterization: 07/2017  Mid RCA lesion is 100% stenosed. Vessel fills by collaterals.  Prox Cx to Mid Cx lesion is 100% stenosed. Vesel fills by left to left collaterals.  Ramus lesion is 50% stenosed.  Mid LAD lesion is 25% stenosed.  LV end diastolic pressure is moderately elevated.  There is no aortic valve stenosis.  Hemodynamic findings consistent with mild  pulmonary hypertension.  Ao sat: 96%, PA sat 63%; CO: 5.4 L.min; CI: 2.1    Recommend Aspirin 81mg  daily for moderate CAD.  Consider CTO PCI attempt if the inferior and lateral myocardium is viable, after his foot ulcer has been addressed.   May need additional diuresis.    Continue treatment for sleep apnea.  Weight loss was also recommended.    Restart metformin in 48 hours.   Echocardiogram: 04/2018 IMPRESSIONS    1. The left ventricle has moderate-severely reduced systolic function, with an ejection fraction of 30-35%. The cavity size was moderately dilated. Left ventricular diastolic Doppler parameters are consistent with pseudonormalization.  2. There is akinesis of the left ventricular, basal-mid inferior wall and lateral wall.  3. The right ventricle has normal systolic function. The cavity was normal. There is no increase in right ventricular wall thickness. Right ventricular systolic pressure could  not be assessed.  4. The aortic valve is tricuspid. Mild to moderate aortic annular calcification noted.  5. The mitral valve is grossly normal. There is mild mitral annular calcification present. There is calcification of the posteromedial papillary muscle and apparatus.  6. The tricuspid valve is grossly normal.  7. The aortic root is normal in size and structure.  8. The interatrial septum was not assessed.  Labs/Other Tests and Data Reviewed:    EKG:  An ECG dated 09/18/2018 was personally reviewed today and demonstrated:  NSR, HR 74 with 1st degree AV Block. No acute ST abnormalities.   Recent Labs: No results found for requested labs within last 8760 hours.   Recent Lipid Panel No results found for: CHOL, TRIG, HDL, CHOLHDL, LDLCALC, LDLDIRECT  Wt Readings from Last 3 Encounters:  01/29/19 296 lb (134.3 kg)  12/24/18 296 lb (134.3 kg)  09/20/18 292 lb (132.5 kg)     Objective:    Vital Signs:  BP 111/78   Pulse 95   Ht 6' (1.829 m)   Wt 296 lb (134.3 kg)    BMI 40.14 kg/m    General: Pleasant male sounding in NAD Psych: Normal affect. Neuro: Alert and oriented X 3. Lungs:  Resp regular and unlabored while talking on the phone.   ASSESSMENT & PLAN:    1. Chronic Systolic CHF/Ischemic Cardiomyopathy - he has a known reduced EF of  30-35% in 07/2017 with similar results by repeat echo in 04/2018.  Medical therapy had been limited in the setting of noncompliance and orthostatic symptoms but he was transitioned to [facility] at the time of his last visit. He is currently on Toprol-XL 50 mg daily and Entresto 24-26mg  BID. BP was at 111/78 today and he reports that he initially had fatigue with initiation of Entresto. I encouraged him to keep a BP log for the next 2 to 3 weeks and report back with readings. Pending his trend, could try adding Spironolactone as I am unsure if he would tolerate dose titration of Entresto. He did have repeat labs at [facility] on 01/25/2019 and will request these records. If a BMET was not obtained, would need to get within the next week as this has not been checked since initiation of Entresto. Would ultimately plan for a repeat echocardiogram in 3 months following optimization of medical therapy.  2. CAD - s/p cath in 07/2017 showing occluded LCx and occluded RCA filled by collaterals with consideration of PCI to CTO RCA and LCx if myocardium viable.  - He denies any recent chest pain or dyspnea on exertion. Continue current medication regimen with ASA 81mg  daily, Simvastatin 20mg  daily and Toprol-XL 50mg  daily.   3. HTN - BP was well controlled at 111/78 when checked during this encounter. Continue current medication regimen.  4. HLD - Followed by PCP. Goal LDL is less than 70 in the setting of known CAD. Will request most recent labs. He is currently on Simvastatin 20 mg daily.  5. History of Osteomyelitis - He has a history of osteomyelitis and required partial amputation of his right foot in 2019. Says he was  evaluated in the ED last week for foot pain and was started on antibiotic therapy. Has been referred to Dr. by his PCP per his report.    COVID-19 Education: The signs and symptoms of COVID-19 were discussed with the patient and how to seek care for testing (follow up with PCP or arrange E-visit). The importance of social distancing  was discussed today.  Time:   Today, I have spent 18 minutes with the patient with telehealth technology discussing the above problems.     Medication Adjustments/Labs and Tests Ordered: Current medicines are reviewed at length with the patient today.  Concerns regarding medicines are outlined above.   Tests Ordered: No orders of the defined types were placed in this encounter.   Medication Changes: Meds ordered this encounter  Medications  . metoprolol succinate (TOPROL-XL) 50 MG 24 hr tablet    Sig: TAKE ONE TABLET BY MOUTH DAILY. TAKE WITH OR IMMEDIATELY FOLLOWING A MEAL.    Dispense:  90 tablet    Refill:  3    Order Specific Question:   Supervising Provider    Answer:   Lars Masson [[id number]]    Follow Up:  In Person in 3 month(s)  Signed, Ellsworth Lennox, PA-C  01/29/2019 4:44 PM    [facility]

## 2019-01-31 ENCOUNTER — Telehealth: Payer: Self-pay | Admitting: Licensed Clinical Social Worker

## 2019-01-31 NOTE — Telephone Encounter (Signed)
CSW referred to assist patient with obtaining a scale. CSW contacted patient to inform scale will be delivered to home. Patient grateful for support and assistance. CSW available as needed. Jackie Nechelle Petrizzo, LCSW, CCSW-MCS [phone]  

## 2019-02-01 DIAGNOSIS — L97529 Non-pressure chronic ulcer of other part of left foot with unspecified severity: Secondary | ICD-10-CM | POA: Diagnosis not present

## 2019-02-01 DIAGNOSIS — L84 Corns and callosities: Secondary | ICD-10-CM | POA: Diagnosis not present

## 2019-02-01 DIAGNOSIS — I10 Essential (primary) hypertension: Secondary | ICD-10-CM | POA: Diagnosis not present

## 2019-02-01 DIAGNOSIS — E669 Obesity, unspecified: Secondary | ICD-10-CM | POA: Diagnosis not present

## 2019-02-01 DIAGNOSIS — L02611 Cutaneous abscess of right foot: Secondary | ICD-10-CM | POA: Diagnosis not present

## 2019-02-01 DIAGNOSIS — L97528 Non-pressure chronic ulcer of other part of left foot with other specified severity: Secondary | ICD-10-CM | POA: Diagnosis not present

## 2019-02-01 DIAGNOSIS — E11621 Type 2 diabetes mellitus with foot ulcer: Secondary | ICD-10-CM | POA: Diagnosis not present

## 2019-02-01 DIAGNOSIS — L97519 Non-pressure chronic ulcer of other part of right foot with unspecified severity: Secondary | ICD-10-CM | POA: Diagnosis not present

## 2019-02-01 DIAGNOSIS — F17290 Nicotine dependence, other tobacco product, uncomplicated: Secondary | ICD-10-CM | POA: Diagnosis not present

## 2019-02-01 DIAGNOSIS — K219 Gastro-esophageal reflux disease without esophagitis: Secondary | ICD-10-CM | POA: Diagnosis not present

## 2019-02-01 DIAGNOSIS — L97518 Non-pressure chronic ulcer of other part of right foot with other specified severity: Secondary | ICD-10-CM | POA: Diagnosis not present

## 2019-02-05 DIAGNOSIS — L97528 Non-pressure chronic ulcer of other part of left foot with other specified severity: Secondary | ICD-10-CM | POA: Diagnosis not present

## 2019-02-05 DIAGNOSIS — S92332A Displaced fracture of third metatarsal bone, left foot, initial encounter for closed fracture: Secondary | ICD-10-CM | POA: Diagnosis not present

## 2019-02-05 DIAGNOSIS — E11621 Type 2 diabetes mellitus with foot ulcer: Secondary | ICD-10-CM | POA: Diagnosis not present

## 2019-02-05 DIAGNOSIS — S92322A Displaced fracture of second metatarsal bone, left foot, initial encounter for closed fracture: Secondary | ICD-10-CM | POA: Diagnosis not present

## 2019-02-05 DIAGNOSIS — L97519 Non-pressure chronic ulcer of other part of right foot with unspecified severity: Secondary | ICD-10-CM | POA: Diagnosis not present

## 2019-02-05 DIAGNOSIS — M869 Osteomyelitis, unspecified: Secondary | ICD-10-CM | POA: Diagnosis not present

## 2019-02-05 DIAGNOSIS — E1169 Type 2 diabetes mellitus with other specified complication: Secondary | ICD-10-CM | POA: Diagnosis not present

## 2019-02-05 DIAGNOSIS — L97518 Non-pressure chronic ulcer of other part of right foot with other specified severity: Secondary | ICD-10-CM | POA: Diagnosis not present

## 2019-02-14 DIAGNOSIS — H40053 Ocular hypertension, bilateral: Secondary | ICD-10-CM | POA: Diagnosis not present

## 2019-02-15 DIAGNOSIS — L02611 Cutaneous abscess of right foot: Secondary | ICD-10-CM | POA: Diagnosis not present

## 2019-02-15 DIAGNOSIS — L97518 Non-pressure chronic ulcer of other part of right foot with other specified severity: Secondary | ICD-10-CM | POA: Diagnosis not present

## 2019-02-15 DIAGNOSIS — E11621 Type 2 diabetes mellitus with foot ulcer: Secondary | ICD-10-CM | POA: Diagnosis not present

## 2019-02-15 DIAGNOSIS — L97512 Non-pressure chronic ulcer of other part of right foot with fat layer exposed: Secondary | ICD-10-CM | POA: Diagnosis not present

## 2019-02-18 DIAGNOSIS — E11621 Type 2 diabetes mellitus with foot ulcer: Secondary | ICD-10-CM | POA: Diagnosis not present

## 2019-02-18 DIAGNOSIS — Z8739 Personal history of other diseases of the musculoskeletal system and connective tissue: Secondary | ICD-10-CM | POA: Diagnosis not present

## 2019-02-18 DIAGNOSIS — L97518 Non-pressure chronic ulcer of other part of right foot with other specified severity: Secondary | ICD-10-CM | POA: Diagnosis not present

## 2019-02-18 DIAGNOSIS — L97528 Non-pressure chronic ulcer of other part of left foot with other specified severity: Secondary | ICD-10-CM | POA: Diagnosis not present

## 2019-02-22 DIAGNOSIS — Z8739 Personal history of other diseases of the musculoskeletal system and connective tissue: Secondary | ICD-10-CM | POA: Diagnosis not present

## 2019-02-22 DIAGNOSIS — E11621 Type 2 diabetes mellitus with foot ulcer: Secondary | ICD-10-CM | POA: Diagnosis not present

## 2019-02-22 DIAGNOSIS — L97528 Non-pressure chronic ulcer of other part of left foot with other specified severity: Secondary | ICD-10-CM | POA: Diagnosis not present

## 2019-02-22 DIAGNOSIS — L97518 Non-pressure chronic ulcer of other part of right foot with other specified severity: Secondary | ICD-10-CM | POA: Diagnosis not present

## 2019-03-22 DIAGNOSIS — E11621 Type 2 diabetes mellitus with foot ulcer: Secondary | ICD-10-CM | POA: Diagnosis not present

## 2019-03-22 DIAGNOSIS — L97528 Non-pressure chronic ulcer of other part of left foot with other specified severity: Secondary | ICD-10-CM | POA: Diagnosis not present

## 2019-03-22 DIAGNOSIS — L97523 Non-pressure chronic ulcer of other part of left foot with necrosis of muscle: Secondary | ICD-10-CM | POA: Diagnosis not present

## 2019-03-22 DIAGNOSIS — L97518 Non-pressure chronic ulcer of other part of right foot with other specified severity: Secondary | ICD-10-CM | POA: Diagnosis not present

## 2019-03-22 DIAGNOSIS — L97513 Non-pressure chronic ulcer of other part of right foot with necrosis of muscle: Secondary | ICD-10-CM | POA: Diagnosis not present

## 2019-03-22 DIAGNOSIS — Z8739 Personal history of other diseases of the musculoskeletal system and connective tissue: Secondary | ICD-10-CM | POA: Diagnosis not present

## 2019-04-11 DIAGNOSIS — E7849 Other hyperlipidemia: Secondary | ICD-10-CM | POA: Diagnosis not present

## 2019-04-11 DIAGNOSIS — E119 Type 2 diabetes mellitus without complications: Secondary | ICD-10-CM | POA: Diagnosis not present

## 2019-04-11 DIAGNOSIS — I10 Essential (primary) hypertension: Secondary | ICD-10-CM | POA: Diagnosis not present

## 2019-04-12 DIAGNOSIS — L97523 Non-pressure chronic ulcer of other part of left foot with necrosis of muscle: Secondary | ICD-10-CM | POA: Diagnosis not present

## 2019-04-12 DIAGNOSIS — L97528 Non-pressure chronic ulcer of other part of left foot with other specified severity: Secondary | ICD-10-CM | POA: Diagnosis not present

## 2019-04-12 DIAGNOSIS — L97513 Non-pressure chronic ulcer of other part of right foot with necrosis of muscle: Secondary | ICD-10-CM | POA: Diagnosis not present

## 2019-04-12 DIAGNOSIS — L97518 Non-pressure chronic ulcer of other part of right foot with other specified severity: Secondary | ICD-10-CM | POA: Diagnosis not present

## 2019-04-12 DIAGNOSIS — E11621 Type 2 diabetes mellitus with foot ulcer: Secondary | ICD-10-CM | POA: Diagnosis not present

## 2019-04-12 DIAGNOSIS — Z8739 Personal history of other diseases of the musculoskeletal system and connective tissue: Secondary | ICD-10-CM | POA: Diagnosis not present

## 2019-04-14 DIAGNOSIS — E1142 Type 2 diabetes mellitus with diabetic polyneuropathy: Secondary | ICD-10-CM | POA: Diagnosis not present

## 2019-04-14 DIAGNOSIS — I2510 Atherosclerotic heart disease of native coronary artery without angina pectoris: Secondary | ICD-10-CM | POA: Diagnosis not present

## 2019-04-14 DIAGNOSIS — F418 Other specified anxiety disorders: Secondary | ICD-10-CM | POA: Diagnosis not present

## 2019-04-14 DIAGNOSIS — I509 Heart failure, unspecified: Secondary | ICD-10-CM | POA: Diagnosis not present

## 2019-04-14 DIAGNOSIS — I110 Hypertensive heart disease with heart failure: Secondary | ICD-10-CM | POA: Diagnosis not present

## 2019-04-14 DIAGNOSIS — E785 Hyperlipidemia, unspecified: Secondary | ICD-10-CM | POA: Diagnosis not present

## 2019-04-14 DIAGNOSIS — Z833 Family history of diabetes mellitus: Secondary | ICD-10-CM | POA: Diagnosis not present

## 2019-04-14 DIAGNOSIS — Z89411 Acquired absence of right great toe: Secondary | ICD-10-CM | POA: Diagnosis not present

## 2019-04-14 DIAGNOSIS — Z87442 Personal history of urinary calculi: Secondary | ICD-10-CM | POA: Diagnosis not present

## 2019-04-14 DIAGNOSIS — F17210 Nicotine dependence, cigarettes, uncomplicated: Secondary | ICD-10-CM | POA: Diagnosis not present

## 2019-04-14 DIAGNOSIS — I739 Peripheral vascular disease, unspecified: Secondary | ICD-10-CM | POA: Diagnosis not present

## 2019-04-25 DIAGNOSIS — Z6841 Body Mass Index (BMI) 40.0 and over, adult: Secondary | ICD-10-CM | POA: Diagnosis not present

## 2019-04-25 DIAGNOSIS — E7849 Other hyperlipidemia: Secondary | ICD-10-CM | POA: Diagnosis not present

## 2019-04-25 DIAGNOSIS — E1142 Type 2 diabetes mellitus with diabetic polyneuropathy: Secondary | ICD-10-CM | POA: Diagnosis not present

## 2019-04-25 DIAGNOSIS — I10 Essential (primary) hypertension: Secondary | ICD-10-CM | POA: Diagnosis not present

## 2019-04-29 ENCOUNTER — Ambulatory Visit: Payer: Medicare PPO | Admitting: Cardiology

## 2019-04-29 NOTE — Progress Notes (Deleted)
Clinical Summary Mr. Reynolds III is a 49 y.o.male  seen today for follow up of the following medical problems.  1.Chronic systolic HF/ICM/CAD - 05/2017 nuclear stress: Large fixed defects anterior, lateral, inferior myocardium. NO reversibility. LVEF 17%. - 07/2017 echo LVEF 30-35%  07/2017 cath LAD 25%, ramus intermedius 50%, LCX occluded, RCA occluded fills by collaterals.  RHC mean PA 33, PCWP 22, CI 2.1 - from cath note could consider PCI to CTO RCA and LCX if myocardium viable. Patient never followed up in clinic after cath.    04/2018 echo LVEF 30-35%, grade II diastolic dysfunction - no recent SOB/DOE. No recent edema. Home weight 296, up from 292 - good compliance with meds since last visti per report, prevously had poor compliance   2. HTN -compliant with meds   3. Foot sore/Osteomyelitis - normal ABIs 08/2017 - s/p amputation of great toe, has healed. - followed by wound care  4. Syncope -prior episodes of syncope.  first episode about 1 week ago while at home. Got up and took a few steps, became very dizzy and fell to ground. Unsure how long he was out, he thinks short period of time. Got up and walked to bed, feeling light headed. Stood up again and walked to the door again, repeat episode. Got back in bed and slept for some time. No repeat since. Has small AC at his home, his apartment stays very hot particularly that weekend. No N/VD. Working to stay hydrated. No EtoH, no significant caffeine intake.    - admission last month for AKI while outside in the heat, had some N/V. Found to be hypotensive - multiple admits in the past for dehydrations  - no recent issues  SH: has 3 pitbulls at home   Past Medical History:  Diagnosis Date  . Angina pectoris with documented spasm (HCC)   . CAD (coronary artery disease)    a. cath in 07/2017 showing occluded LCx and occluded RCA filled by collaterals with consideration of PCI to CTO RCA and LCx if  myocardium viable --> did not follow-up after cath  . CHF (congestive heart failure) (HCC)    a. EF 30-35% in 07/2017 b. similar results by repeat echo in 04/2018  . Corns and callosities   . Diabetes mellitus without complication (HCC)   . Hyperlipidemia   . Hypertension   . Other sleep apnea   . Pancreatitis      No Known Allergies   Current Outpatient Medications  Medication Sig Dispense Refill  . aspirin EC 81 MG tablet Take 1 tablet (81 mg total) by mouth daily. 90 tablet 3  . DULoxetine (CYMBALTA) 60 MG capsule Take 60 mg by mouth daily.     Marland Kitchen FLUoxetine (PROZAC) 20 MG capsule Take 20 mg by mouth every morning.    . gabapentin (NEURONTIN) 300 MG capsule Take 300 mg by mouth 4 (four) times daily.    . Insulin Pen Needle (EXEL COMFORT POINT PEN NEEDLE) 31G X 6 MM MISC USE ONCE DAILY TO INJECT INSULIN    . LEVEMIR FLEXTOUCH 100 UNIT/ML Pen Inject 30 Units into the skin 2 (two) times daily. Dependant upon blood sugar level  2  . metFORMIN (GLUCOPHAGE) 1000 MG tablet Take 1,000 mg by mouth 2 (two) times daily with a meal.    . metoprolol succinate (TOPROL-XL) 50 MG 24 hr tablet TAKE ONE TABLET BY MOUTH DAILY. TAKE WITH OR IMMEDIATELY FOLLOWING A MEAL. 90 tablet 3  . Omega-3 Fatty  Acids (FISH OIL) 1000 MG CAPS Take 1 capsule by mouth 3 (three) times daily.    . sacubitril-valsartan (ENTRESTO) 24-26 MG Take 1 tablet by mouth 2 (two) times daily. 60 tablet 6  . simvastatin (ZOCOR) 20 MG tablet Take 20 mg by mouth daily.     No current facility-administered medications for this visit.     Past Surgical History:  Procedure Laterality Date  . APPENDECTOMY  2016  . OTHER SURGICAL HISTORY  2016  . RIGHT/LEFT HEART CATH AND CORONARY ANGIOGRAPHY N/A 08/08/2017   Procedure: RIGHT/LEFT HEART CATH AND CORONARY ANGIOGRAPHY;  Surgeon: Jettie Booze, MD;  Location: [place] [facility];  Service: Cardiovascular;  Laterality: N/A;     No Known Allergies    Family History  Problem  Relation Age of Onset  . Aneurysm Father      Social History Mr. Reynolds III reports that he quit smoking about 2 years ago. His smoking use included e-cigarettes. He has never used smokeless tobacco. Mr. Doy Mince III reports no history of alcohol use.   Review of Systems CONSTITUTIONAL: No weight loss, fever, chills, weakness or fatigue.  HEENT: Eyes: No visual loss, blurred vision, double vision or yellow sclerae.No hearing loss, sneezing, congestion, runny nose or sore throat.  SKIN: No rash or itching.  CARDIOVASCULAR:  RESPIRATORY: No shortness of breath, cough or sputum.  GASTROINTESTINAL: No anorexia, nausea, vomiting or diarrhea. No abdominal pain or blood.  GENITOURINARY: No burning on urination, no polyuria NEUROLOGICAL: No headache, dizziness, syncope, paralysis, ataxia, numbness or tingling in the extremities. No change in bowel or bladder control.  MUSCULOSKELETAL: No muscle, back pain, joint pain or stiffness.  LYMPHATICS: No enlarged nodes. No history of splenectomy.  PSYCHIATRIC: No history of depression or anxiety.  ENDOCRINOLOGIC: No reports of sweating, cold or heat intolerance. No polyuria or polydipsia.  Marland Kitchen   Physical Examination There were no vitals filed for this visit. There were no vitals filed for this visit.  Gen: resting comfortably, no acute distress HEENT: no scleral icterus, pupils equal round and reactive, no palptable cervical adenopathy,  CV Resp: Clear to auscultation bilaterally GI: abdomen is soft, non-tender, non-distended, normal bowel sounds, no hepatosplenomegaly MSK: extremities are warm, no edema.  Skin: warm, no rash Neuro:  no focal deficits Psych: appropriate affect   Diagnostic Studies     Assessment and Plan  1.Chronic systolic HF/ICM/CAD - management has been limited due to mixed compliance with meds and follow up. Titration of meds also limited by orthostastic symptoms - reports recent compliance. We will d/c his  lisinopril/hctz, start entresto 24/26mg  bid - titrate meds as toelrated over follow ups - would look to change his statin in near future to more potent statin given his CAD. With issues with compliance and following his regimen I don/t want to make too many changes at once.    2. HTN - changing to entresto as reported above.    3. Orthostatic syncope - no recent issues      Arnoldo Lenis, M.D., F.A.C.C.

## 2019-04-30 ENCOUNTER — Other Ambulatory Visit: Payer: Self-pay | Admitting: *Deleted

## 2019-04-30 MED ORDER — METOPROLOL SUCCINATE ER 50 MG PO TB24: ORAL_TABLET | ORAL | 0 refills | Status: DC

## 2019-04-30 MED ORDER — ENTRESTO 24-26 MG PO TABS: 1.0000 | ORAL_TABLET | Freq: Two times a day (BID) | ORAL | 0 refills | Status: DC

## 2019-05-03 DIAGNOSIS — I10 Essential (primary) hypertension: Secondary | ICD-10-CM | POA: Diagnosis not present

## 2019-05-03 DIAGNOSIS — E1142 Type 2 diabetes mellitus with diabetic polyneuropathy: Secondary | ICD-10-CM | POA: Diagnosis not present

## 2019-05-03 DIAGNOSIS — E7849 Other hyperlipidemia: Secondary | ICD-10-CM | POA: Diagnosis not present

## 2019-05-13 ENCOUNTER — Encounter (HOSPITAL_BASED_OUTPATIENT_CLINIC_OR_DEPARTMENT_OTHER): Payer: Medicare PPO | Attending: Internal Medicine | Admitting: Internal Medicine

## 2019-05-13 ENCOUNTER — Other Ambulatory Visit: Payer: Self-pay

## 2019-05-13 DIAGNOSIS — E11621 Type 2 diabetes mellitus with foot ulcer: Secondary | ICD-10-CM | POA: Insufficient documentation

## 2019-05-13 DIAGNOSIS — I509 Heart failure, unspecified: Secondary | ICD-10-CM | POA: Diagnosis not present

## 2019-05-13 DIAGNOSIS — I2510 Atherosclerotic heart disease of native coronary artery without angina pectoris: Secondary | ICD-10-CM | POA: Diagnosis not present

## 2019-05-13 DIAGNOSIS — G4730 Sleep apnea, unspecified: Secondary | ICD-10-CM | POA: Insufficient documentation

## 2019-05-13 DIAGNOSIS — E1151 Type 2 diabetes mellitus with diabetic peripheral angiopathy without gangrene: Secondary | ICD-10-CM | POA: Diagnosis not present

## 2019-05-13 DIAGNOSIS — E1142 Type 2 diabetes mellitus with diabetic polyneuropathy: Secondary | ICD-10-CM | POA: Diagnosis not present

## 2019-05-13 DIAGNOSIS — L97522 Non-pressure chronic ulcer of other part of left foot with fat layer exposed: Secondary | ICD-10-CM | POA: Diagnosis not present

## 2019-05-13 DIAGNOSIS — I110 Hypertensive heart disease with heart failure: Secondary | ICD-10-CM | POA: Insufficient documentation

## 2019-05-14 NOTE — Progress Notes (Signed)
[name], [name] ([id number]) Visit Report for 05/13/2019 Chief Complaint Document Details Patient Name: Date of Service: [name], [name] 05/13/2019 1:15 PM Medical Record Number:[id number] Patient Account Number: [id number] Date of Birth/Sex: Treating RN: 30-Jun-1970 (48 y.o. Male) Zandra Abts Primary Care Provider: [name] Other Clinician: Referring Provider: Treating Provider/Extender:Vasti Yagi, Charolotte Capuchin, Ellin Mayhew in Treatment: 0 Information Obtained from: Patient Chief Complaint 05/13/2019; patient is here for review of wounds on his left first and left fourth toes. He says he was referred for hyperbarics although we do not have anything to really support that Electronic Signature(s) Signed: 05/13/2019 5:40:04 PM By: Baltazar Najjar MD Entered By: Baltazar Najjar on 05/13/2019 14:48:56 -------------------------------------------------------------------------------- Debridement Details Patient Name: [name] Date of Service: 05/13/2019 1:15 PM Medical Record Number:[id number] Patient Account Number: [id number] Date of Birth/Sex: Treating RN: Jul 02, 1970 (49 y.o. Male) Zandra Abts Primary Care Provider: [name] Other Clinician: Referring Provider: Treating Provider/Extender:Cortasia Screws, Charolotte Capuchin, Ellin Mayhew in Treatment: 0 Debridement Performed for Wound #1 Left Toe Great Assessment: Performed By: Physician Maxwell Caul., MD Debridement Type: Debridement Severity of Tissue Pre Fat layer exposed Debridement: Level of Consciousness (Pre- Awake and Alert procedure): Pre-procedure Yes - 14:14 Verification/Time Out Taken: Start Time: 14:14 Total Area Debrided (L x W): 0.1 (cm) x 0.2 (cm) = 0.02 (cm) Tissue and other material Viable, Non-Viable, Callus, Subcutaneous debrided: Level: Skin/Subcutaneous Tissue Debridement Description: Excisional Instrument: Curette Bleeding: Minimum Hemostasis Achieved: Pressure End Time:  14:15 Procedural Pain: 0 Post Procedural Pain: 0 Response to Treatment: Procedure was tolerated well Level of Consciousness Awake and Alert (Post-procedure): Post Debridement Measurements of Total Wound Length: (cm) 0.1 Width: (cm) 0.2 Depth: (cm) 0.1 Volume: (cm) 0.002 Character of Wound/Ulcer Post Improved Debridement: Severity of Tissue Post Debridement: Fat layer exposed Post Procedure Diagnosis Same as Pre-procedure Electronic Signature(s) Signed: 05/13/2019 5:33:33 PM By: Zandra Abts RN, BSN Signed: 05/13/2019 5:40:04 PM By: Baltazar Najjar MD Entered By: Baltazar Najjar on 05/13/2019 14:48:09 -------------------------------------------------------------------------------- Debridement Details Patient Name: [name] Date of Service: 05/13/2019 1:15 PM Medical Record Number:[id number] Patient Account Number: [id number] Date of Birth/Sex: Treating RN: [birth year] (48 y.o. Male) Zandra Abts Primary Care Provider: [name] Other Clinician: Referring Provider: Treating Provider/Extender:Braxley Balandran, Charolotte Capuchin, Ellin Mayhew in Treatment: 0 Debridement Performed for Wound #2 Left Toe Fourth Assessment: Performed By: Physician Maxwell Caul., MD Debridement Type: Debridement Severity of Tissue Pre Fat layer exposed Debridement: Level of Consciousness (Pre- Awake and Alert procedure): Pre-procedure Yes - 14:14 Verification/Time Out Taken: Start Time: 14:14 Total Area Debrided (L x W): 0.2 (cm) x 0.2 (cm) = 0.04 (cm) Tissue and other material Viable, Non-Viable, Callus, Subcutaneous debrided: Level: Skin/Subcutaneous Tissue Debridement Description: Excisional Instrument: Curette Bleeding: Minimum Hemostasis Achieved: Pressure End Time: 14:15 Procedural Pain: 0 Post Procedural Pain: 0 Response to Treatment: Procedure was tolerated well Level of Consciousness Awake and Alert (Post-procedure): Post Debridement Measurements of Total  Wound Length: (cm) 0.2 Width: (cm) 0.2 Depth: (cm) 0.1 Volume: (cm) 0.003 Character of Wound/Ulcer Post Improved Debridement: Severity of Tissue Post Debridement: Fat layer exposed Post Procedure Diagnosis Same as Pre-procedure Electronic Signature(s) Signed: 05/13/2019 5:33:33 PM By: Zandra Abts RN, BSN Signed: 05/13/2019 5:40:04 PM By: Baltazar Najjar MD Entered By: Baltazar Najjar on 05/13/2019 14:48:17 -------------------------------------------------------------------------------- HPI Details Patient Name: Date of Service: [name], [name] 05/13/2019 1:15 PM Medical Record Number:[id number] Patient Account Number: [id number] Date of Birth/Sex: Treating RN: 07/01/1970 (48 y.o. Male) Zandra Abts Primary Care Provider: [name] Other Clinician: Referring Provider: Treating Provider/Extender:Shenelle Klas, Charolotte Capuchin,  Annette StableXAJE Weeks in Treatment: 0 History of Present Illness HPI Description: ADMISSION 05/13/2019 This is a 49 year old man who has type 2 diabetes with peripheral neuropathy. He has a history of wounds on the plantar aspect of his left first and fourth toes. He has had these for several months. [facility] [place]. He apparently presented with swelling and an open wound at the tip of the toe. An MRI showed osteomyelitis. He was given 6 to 8 weeks of oral doxycycline again all of this via the patient we have none of these records. Since then he has been putting Gold[place] on the areas wearing regular shoes. He was seen by his primary physician on 4/9 and sent down here for our review. In the primary care note it says he has been recommended for hyperbaric oxygen. Past medical history includes type 2 diabetes with peripheral neuropathy, heart failure with reduced ejection fraction 30 to 35%, peripheral arterial disease, coronary artery disease, hyperlipidemia, hypertension, syncope and a  prior history of a right great toe amputation [place] Dr. [place] ABIs in our clinic were 0.91 on the left. Also notable that in 2019 he appears to have had arterial studies that are visible in our system. At that point the right ABI was 1.17, TBI of 0.85 with triphasic waveforms. On the left his ABI was 1.19 with a TBI of 0.73 again with triphasic waveform Electronic Signature(s) Signed: 05/13/2019 5:40:04 PM By: Baltazar Najjarobson, Telena Peyser MD Entered By: Baltazar Najjarobson, Kyriana Yankee on 05/13/2019 14:58:20 -------------------------------------------------------------------------------- Physical Exam Details Patient Name: Date of Service: [name], [name] 05/13/2019 1:15 PM Medical Record Number:[id number] Patient Account Number: [id number] Date of Birth/Sex: Treating RN: Dec 10, 1970 (48 y.o. Male) Zandra AbtsLynch, Shatara Primary Care Provider: Lia HoppingHASANAJ, XAJE Other Clinician: Referring Provider: Treating Provider/Extender:Tally Mckinnon, Charolotte CapuchinMichael HASANAJ, Ellin MayhewXAJE Weeks in Treatment: 0 Constitutional Sitting or standing Blood Pressure is within target range for patient.. Pulse regular and within target range for patient.Marland Kitchen. Respirations regular, non-labored and within target range.. Temperature is normal and within the target range for the patient.Marland Kitchen. Appears in no distress. Respiratory work of breathing is normal. Bilateral breath sounds are clear and equal in all lobes with no wheezes, rales or rhonchi.. Cardiovascular Heart rhythm and rate regular, without murmur or gallop.. The pulses are easily palpable on the left robust. Musculoskeletal There is no palpable pain in the inner phalangeal joints of either toe or the metatarsal phalangeal joints. Integumentary (Hair, Skin) There is no evidence of infection around the 2 wounds which are on the left first and left fourth toe. Neurological Diabetic insensate neuropathy to monofilament, vibration and pain. Psychiatric appears at normal baseline. Notes Wound exam; the area  in question is on the tip of the left first and fourth toes. Very similar wounds. Horizontally orientated wounds thick callus around the edges which I removed with a #5 curette and then debris from the center of the wounds reveals him to still be open. There is no palpable bone no surrounding erythema Electronic Signature(s) Signed: 05/13/2019 5:40:04 PM By: Baltazar Najjarobson, Brena Windsor MD Entered By: Baltazar Najjarobson, Isa Hitz on 05/13/2019 14:54:38 -------------------------------------------------------------------------------- Physician Orders Details Patient Name: Date of Service: [name], [name] 05/13/2019 1:15 PM Medical Record Number:[id number] Patient Account Number: [id number] Date of Birth/Sex: Treating RN: Dec 10, 1970 (48 y.o. Male) Zandra AbtsLynch, Shatara Primary Care Provider: Lia HoppingHASANAJ, XAJE Other Clinician: Referring Provider: Treating Provider/Extender:Valyncia Wiens, Charolotte CapuchinMichael HASANAJ, Ellin MayhewXAJE Weeks in Treatment: 0 Verbal / Phone Orders: No Diagnosis Coding Follow-up Appointments Return Appointment in  1 week. Dressing Change Frequency Wound #1 Left Toe Great Change Dressing every other day. Wound #2 Left Toe Fourth Change Dressing every other day. Wound Cleansing Wound #1 Left Toe Great May shower and wash wound with soap and water. - on days that dressing is changed Wound #2 Left Toe Fourth May shower and wash wound with soap and water. - on days that dressing is changed Primary Wound Dressing Wound #1 Left Toe Great Silver Collagen - moisten with hydrogel or KY jelly Wound #2 Left Toe Fourth Silver Collagen - moisten with hydrogel or KY jelly Secondary Dressing Wound #1 Left Toe Great Kerlix/Rolled Gauze Dry Gauze Wound #2 Left Toe Fourth Kerlix/Rolled Gauze Dry Gauze Off-Loading Open toe surgical shoe to: - left foot Additional Orders / Instructions Stop/Decrease Smoking Electronic Signature(s) Signed: 05/13/2019 5:33:33 PM By: Zandra Abts RN, BSN Signed: 05/13/2019 5:40:04 PM By: Baltazar Najjar MD Entered By: Zandra Abts on 05/13/2019 14:21:36 -------------------------------------------------------------------------------- Problem List Details Patient Name: Date of Service: [name], [name] 05/13/2019 1:15 PM Medical Record Number:[id number] Patient Account Number: [id number] Date of Birth/Sex: Treating RN: 09/16/1970 (48 y.o. Male) Zandra Abts Primary Care Provider: [name] Other Clinician: Referring Provider: Treating Provider/Extender:Chenae Brager, Charolotte Capuchin, Ellin Mayhew in Treatment: 0 Active Problems ICD-10 Encounter Code Description Active Date MDM Diagnosis E11.621 Type 2 diabetes mellitus with foot ulcer 05/13/2019 No Yes L97.522 Non-pressure chronic ulcer of other part of left foot with 05/13/2019 No Yes fat layer exposed E11.42 Type 2 diabetes mellitus with diabetic polyneuropathy 05/13/2019 No Yes Inactive Problems Resolved Problems Electronic Signature(s) Signed: 05/13/2019 5:40:04 PM By: Baltazar Najjar MD Entered By: Baltazar Najjar on 05/13/2019 14:26:18 -------------------------------------------------------------------------------- Progress Note/History and Physical Details Patient Name: Date of Service: [name], [name] 05/13/2019 1:15 PM Medical Record Number:[id number] Patient Account Number: [id number] Date of Birth/Sex: Treating RN: 08-19-1970 (48 y.o. Male) Zandra Abts Primary Care Provider: [name] Other Clinician: Referring Provider: Treating Provider/Extender:Demarious Kapur, Charolotte Capuchin, Ellin Mayhew in Treatment: 0 Subjective Chief Complaint Information obtained from Patient 05/13/2019; patient is here for review of wounds on his left first and left fourth toes. He says he was referred for hyperbarics although we do not have anything to really support that History of Present Illness (HPI) ADMISSION 05/13/2019 This is a 49 year old man who has type 2 diabetes with peripheral neuropathy. He has a history of  wounds on the plantar aspect of his left first and fourth toes. He has had these for several months. He was being seen in the wound care center at [facility] in [facility]. He apparently presented with swelling and an open wound at the tip of the toe. An MRI showed osteomyelitis. He was given 6 to 8 weeks of oral doxycycline again all of this via the patient we have none of these records. Since then he has been putting Goldbond on the areas wearing regular shoes. He was seen by his primary physician on 4/9 and sent down here for our review. In the primary care note it says he has been recommended for hyperbaric oxygen. Past medical history includes type 2 diabetes with peripheral neuropathy, heart failure with reduced ejection fraction 30 to 35%, peripheral arterial disease, coronary artery disease, hyperlipidemia, hypertension, syncope and a prior history of a right great toe amputation also by Dr. Lynden Ang in [place] ABIs in our clinic were 0.91 on the left. Also notable that in 2019 he appears to have had arterial studies that are visible in our system. At that point the right ABI was  1.17, TBI of 0.85 with triphasic waveforms. On the left his ABI was 1.19 with a TBI of 0.73 again with triphasic waveform Patient History Information obtained from Patient. Allergies No Known Allergies Family History Cancer, Hypertension - Father, Lung Disease - Paternal Grandparents, No family history of Diabetes, Heart Disease, Hereditary Spherocytosis, Kidney Disease, Seizures, Stroke, Thyroid Problems, Tuberculosis. Social History Current every day smoker, Marital Status - Separated, Alcohol Use - Rarely, Drug Use - No History, Caffeine Use - Daily. Medical History Eyes Denies history of Cataracts, Glaucoma, Optic Neuritis Ear/Nose/Mouth/Throat Denies history of Chronic sinus problems/congestion, Middle ear problems Hematologic/Lymphatic Denies history of Anemia, Hemophilia, Human  Immunodeficiency Virus, Lymphedema, Sickle Cell Disease Respiratory Patient has history of Sleep Apnea Denies history of Aspiration, Asthma, Chronic Obstructive Pulmonary Disease (COPD), Pneumothorax, Tuberculosis Cardiovascular Patient has history of Congestive Heart Failure, Hypertension Denies history of Angina, Arrhythmia, Coronary Artery Disease, Deep Vein Thrombosis, Hypotension, Myocardial Infarction, Peripheral Arterial Disease, Peripheral Venous Disease, Phlebitis, Vasculitis Gastrointestinal Gastrointestinal Denies history of Cirrhosis , Colitis, Crohnoos, Hepatitis A, Hepatitis B, Hepatitis C Endocrine Patient has history of Type II Diabetes Denies history of Type I Diabetes Genitourinary Denies history of End Stage Renal Disease Immunological Denies history of Lupus Erythematosus, Raynaudoos, Scleroderma Integumentary (Skin) Denies history of History of Burn Musculoskeletal Denies history of Gout, Rheumatoid Arthritis, Osteoarthritis, Osteomyelitis Neurologic Denies history of Dementia, Neuropathy, Quadriplegia, Paraplegia, Seizure Disorder Oncologic Denies history of Received Chemotherapy, Received Radiation Psychiatric Denies history of Anorexia/bulimia, Confinement Anxiety Patient is treated with Insulin, Oral Agents. Blood sugar is not tested. Review of Systems (ROS) Constitutional Symptoms (General Health) Denies complaints or symptoms of Fatigue, Fever, Chills, Marked Weight Change. Eyes Denies complaints or symptoms of Dry Eyes, Vision Changes, Glasses / Contacts. Ear/Nose/Mouth/Throat Denies complaints or symptoms of Chronic sinus problems or rhinitis. Respiratory Denies complaints or symptoms of Chronic or frequent coughs, Shortness of Breath. Cardiovascular Denies complaints or symptoms of Chest pain. Gastrointestinal Denies complaints or symptoms of Frequent diarrhea, Nausea, Vomiting. Endocrine Denies complaints or symptoms of Heat/cold  intolerance. Genitourinary Denies complaints or symptoms of Frequent urination. Integumentary (Skin) Complains or has symptoms of Wounds. Musculoskeletal Denies complaints or symptoms of Muscle Pain, Muscle Weakness. Neurologic Denies complaints or symptoms of Numbness/parasthesias. Psychiatric Denies complaints or symptoms of Claustrophobia, Suicidal. Objective Constitutional Sitting or standing Blood Pressure is within target range for patient.. Pulse regular and within target range for patient.Marland Kitchen Respirations regular, non-labored and within target range.. Temperature is normal and within the target range for the patient.Marland Kitchen Appears in no distress. Vitals Time Taken: 1:26 PM, Height: 73 in, Source: Stated, Weight: 301 lbs, Source: Stated, BMI: 39.7, Temperature: 98.6 F, Pulse: 86 bpm, Respiratory Rate: 18 breaths/min, Blood Pressure: 121/67 mmHg. Respiratory work of breathing is normal. Bilateral breath sounds are clear and equal in all lobes with no wheezes, rales or rhonchi.. Cardiovascular Heart rhythm and rate regular, without murmur or gallop.. The pulses are easily palpable on the left robust. Musculoskeletal There is no palpable pain in the inner phalangeal joints of either toe or the metatarsal phalangeal joints. Neurological Diabetic insensate neuropathy to monofilament, vibration and pain. Psychiatric appears at normal baseline. General Notes: Wound exam; the area in question is on the tip of the left first and fourth toes. Very similar wounds. Horizontally orientated wounds thick callus around the edges which I removed with a #5 curette and then debris from the center of the wounds reveals him to still be open. There is no palpable bone no surrounding erythema Integumentary (Hair,  Skin) There is no evidence of infection around the 2 wounds which are on the left first and left fourth toe. Wound #1 status is Open. Original cause of wound was Gradually Appeared. The wound  is located on the Left Toe Great. The wound measures 0.1cm length x 0.2cm width x 0.1cm depth; 0.016cm^2 area and 0.002cm^3 volume. There is Fat Layer (Subcutaneous Tissue) Exposed exposed. There is no tunneling or undermining noted. There is a medium amount of serosanguineous drainage noted. There is large (67-100%) red granulation within the wound bed. There is no necrotic tissue within the wound bed. Wound #2 status is Open. Original cause of wound was Gradually Appeared. The wound is located on the Left Toe Fourth. The wound measures 0.2cm length x 0.2cm width x 0.1cm depth; 0.031cm^2 area and 0.003cm^3 volume. There is Fat Layer (Subcutaneous Tissue) Exposed exposed. There is no tunneling or undermining noted. There is a medium amount of serosanguineous drainage noted. There is large (67-100%) red granulation within the wound bed. Assessment Active Problems ICD-10 Type 2 diabetes mellitus with foot ulcer Non-pressure chronic ulcer of other part of left foot with fat layer exposed Type 2 diabetes mellitus with diabetic polyneuropathy Procedures Wound #1 Pre-procedure diagnosis of Wound #1 is a Diabetic Wound/Ulcer of the Lower Extremity located on the Left Toe Great .Severity of Tissue Pre Debridement is: Fat layer exposed. There was a Excisional Skin/Subcutaneous Tissue Debridement with a total area of 0.02 sq cm performed by Maxwell Caul., MD. With the following instrument(s): Curette to remove Viable and Non-Viable tissue/material. Material removed includes Callus and Subcutaneous Tissue and. No specimens were taken. A time out was conducted at 14:14, prior to the start of the procedure. A Minimum amount of bleeding was controlled with Pressure. The procedure was tolerated well with a pain level of 0 throughout and a pain level of 0 following the procedure. Post Debridement Measurements: 0.1cm length x 0.2cm width x 0.1cm depth; 0.002cm^3 volume. Character of Wound/Ulcer Post  Debridement is improved. Severity of Tissue Post Debridement is: Fat layer exposed. Post procedure Diagnosis Wound #1: Same as Pre-Procedure Wound #2 Pre-procedure diagnosis of Wound #2 is a Diabetic Wound/Ulcer of the Lower Extremity located on the Left Toe Fourth .Severity of Tissue Pre Debridement is: Fat layer exposed. There was a Excisional Skin/Subcutaneous Tissue Debridement with a total area of 0.04 sq cm performed by Maxwell Caul., MD. With the following instrument(s): Curette to remove Viable and Non-Viable tissue/material. Material removed includes Callus and Subcutaneous Tissue and. No specimens were taken. A time out was conducted at 14:14, prior to the start of the procedure. A Minimum amount of bleeding was controlled with Pressure. The procedure was tolerated well with a pain level of 0 throughout and a pain level of 0 following the procedure. Post Debridement Measurements: 0.2cm length x 0.2cm width x 0.1cm depth; 0.003cm^3 volume. Character of Wound/Ulcer Post Debridement is improved. Severity of Tissue Post Debridement is: Fat layer exposed. Post procedure Diagnosis Wound #2: Same as Pre-Procedure Plan Follow-up Appointments: Return Appointment in 1 week. Dressing Change Frequency: Wound #1 Left Toe Great: Change Dressing every other day. Wound #2 Left Toe Fourth: Change Dressing every other day. Wound Cleansing: Wound #1 Left Toe Great: May shower and wash wound with soap and water. - on days that dressing is changed Wound #2 Left Toe Fourth: May shower and wash wound with soap and water. - on days that dressing is changed Primary Wound Dressing: Wound #1 Left Toe Great: Silver  Collagen - moisten with hydrogel or KY jelly Wound #2 Left Toe Fourth: Silver Collagen - moisten with hydrogel or KY jelly Secondary Dressing: Wound #1 Left Toe Great: Kerlix/Rolled Gauze Dry Gauze Wound #2 Left Toe Fourth: Kerlix/Rolled Gauze Dry Gauze Off-Loading: Open toe  surgical shoe to: - left foot Additional Orders / Instructions: Stop/Decrease Smoking 1. We will use silver collagen in both of these areas. I have asked him to wear his surgical offloading shoe that he has at home. It sounds as though he also has a Dar[employer] forefoot off loader 2. By MRI he apparently had osteomyelitis although we do not have that report I am not sure if there are cultures to support the use of doxycycline which she is completed 3. He finishes antibiotics about a month ago. We will solicit records from Dr. Tye [place] and the wound care center in [place]. Hopefully they will be able to give [place] description of the wounds, MRI report, any relevant cultures etc. 4; I am not really sure about whether he requires hyperbarics at this point. The wounds are small although there is depth. No palpable bone. If we can get these to close perhaps a period of watchful waiting 5. By clinical exam, ABI in our clinic and also the formal arterial studies he had in 2019 there does not appear to be any evidence of arterial insufficiency I spent 35 minutes on review of this patient's accompanying records, face-to-face evaluation and preparation of this record Electronic Signature(s) Signed: 05/13/2019 2:59:02 PM By: Linton Ham MD Entered By: Linton Ham on 05/13/2019 14:59:02 -------------------------------------------------------------------------------- HxROS Details Patient Name: [name] Date of Service: 05/13/2019 1:15 PM Medical Record [id number] Patient Account Number: [id number] Date of Birth/Sex: Treating RN: Aug 03, 1970 (48 y.o. Male) Carlene Coria Primary Care Provider: Stoney Bang Other Clinician: Referring Provider: Treating Provider/Extender:Eliyahu Bille, Lyn Hollingshead, Areta Haber in Treatment: 0 Label Progress Note Print Version as History and Physical for this encounter Information Obtained From Patient Constitutional Symptoms (General Health) Complaints and  Symptoms: Negative for: Fatigue; Fever; Chills; Marked Weight Change Eyes Complaints and Symptoms: Negative for: Dry Eyes; Vision Changes; Glasses / Contacts Medical History: Negative for: Cataracts; Glaucoma; Optic Neuritis Ear/Nose/Mouth/Throat Complaints and Symptoms: Negative for: Chronic sinus problems or rhinitis Medical History: Negative for: Chronic sinus problems/congestion; Middle ear problems Respiratory Complaints and Symptoms: Negative for: Chronic or frequent coughs; Shortness of Breath Medical History: Positive for: Sleep Apnea Negative for: Aspiration; Asthma; Chronic Obstructive Pulmonary Disease (COPD); Pneumothorax; Tuberculosis Cardiovascular Complaints and Symptoms: Negative for: Chest pain Medical History: Positive for: Congestive Heart Failure; Hypertension Negative for: Angina; Arrhythmia; Coronary Artery Disease; Deep Vein Thrombosis; Hypotension; Myocardial Infarction; Peripheral Arterial Disease; Peripheral Venous Disease; Phlebitis; Vasculitis Gastrointestinal Complaints and Symptoms: Negative for: Frequent diarrhea; Nausea; Vomiting Medical History: Negative for: Cirrhosis ; Colitis; Crohns; Hepatitis A; Hepatitis B; Hepatitis C Endocrine Complaints and Symptoms: Negative for: Heat/cold intolerance Medical History: Positive for: Type II Diabetes Negative for: Type I Diabetes Time with diabetes: 12 years Treated with: Insulin, Oral agents Blood sugar tested every day: No Genitourinary Complaints and Symptoms: Negative for: Frequent urination Medical History: Negative for: End Stage Renal Disease Integumentary (Skin) Complaints and Symptoms: Positive for: Wounds Medical History: Negative for: History of Burn Musculoskeletal Complaints and Symptoms: Negative for: Muscle Pain; Muscle Weakness Medical History: Negative for: Gout; Rheumatoid Arthritis; Osteoarthritis; Osteomyelitis Neurologic Complaints and Symptoms: Negative for:  Numbness/parasthesias Medical History: Negative for: Dementia; Neuropathy; Quadriplegia; Paraplegia; Seizure Disorder Psychiatric Complaints and Symptoms: Negative for: Claustrophobia; Suicidal Medical History: Negative  for: Anorexia/bulimia; Confinement Anxiety Hematologic/Lymphatic Medical History: Negative for: Anemia; Hemophilia; Human Immunodeficiency Virus; Lymphedema; Sickle Cell Disease Immunological Medical History: Negative for: Lupus Erythematosus; Raynauds; Scleroderma Oncologic Medical History: Negative for: Received Chemotherapy; Received Radiation Immunizations Pneumococcal Vaccine: Received Pneumococcal Vaccination: No Implantable Devices None Family and Social History Cancer: Yes; Diabetes: No; Heart Disease: No; Hereditary Spherocytosis: No; Hypertension: Yes - Father; Kidney Disease: No; Lung Disease: Yes - Paternal Grandparents; Seizures: No; Stroke: No; Thyroid Problems: No; Tuberculosis: No; Current every day smoker; Marital Status - Separated; Alcohol Use: Rarely; Drug Use: No History; Caffeine Use: Daily; Financial Concerns: No; Food, Clothing or Shelter Needs: No; Support System Lacking: No; Transportation Concerns: No Electronic Signature(s) Signed: 05/13/2019 5:40:04 PM By: Baltazar Najjar MD Signed: 05/14/2019 5:28:23 PM By: Yevonne Pax RN Entered By: Yevonne Pax on 05/13/2019 13:33:19 -------------------------------------------------------------------------------- SuperBill Details Patient Name: Date of Service: [name] 05/13/2019 Medical Record Number:[id number] Patient Account Number: [id number] Date of Birth/Sex: Treating RN: 1970/02/12 (48 y.o. Male) Zandra Abts Primary Care Provider: [name] Other Clinician: Referring Provider: Treating Provider/Extender:Milan Perkins, Charolotte Capuchin, Ellin Mayhew in Treatment: 0 Diagnosis Coding ICD-10 Codes Code Description E11.621 Type 2 diabetes mellitus with foot ulcer L97.522  Non-pressure chronic ulcer of other part of left foot with fat layer exposed E11.42 Type 2 diabetes mellitus with diabetic polyneuropathy Facility Procedures CPT4 Code Description: 45409811 99213 - WOUND CARE VISIT-LEV 3 EST PT Modifier: 25 Quantity: 1 CPT4 Code Description: 91478295 11042 - DEB SUBQ TISSUE 20 SQ CM/< ICD-10 Diagnosis Description L97.522 Non-pressure chronic ulcer of other part of left foot with fat lay Modifier: er exposed Quantity: 1 Physician Procedures CPT4 Code Description: [id number] WC PHYS LEVEL 3 NEW PT ICD-10 Diagnosis Description E11.621 Type 2 diabetes mellitus with foot ulcer L97.522 Non-pressure chronic ulcer of other part of left foot with fa E11.42 Type 2 diabetes mellitus with diabetic  polyneuropathy Modifier: 25 t layer expose Quantity: 1 d CPT4 Code Description: 5784696 11042 - WC PHYS SUBQ TISS 20 SQ CM ICD-10 Diagnosis Description L97.522 Non-pressure chronic ulcer of other part of left foot with Modifier: fat layer expo Quantity: 1 sed Electronic Signature(s) Signed: 05/13/2019 5:33:33 PM By: Zandra Abts RN, BSN Signed: 05/13/2019 5:40:04 PM By: Baltazar Najjar MD Entered By: Zandra Abts on 05/13/2019 14:58:44

## 2019-05-14 NOTE — Progress Notes (Signed)
ALIJA, RIANO ([id number]) Visit Report for 05/13/2019 Allergy List Details Patient Name: Date of Service: [name], [name] 05/13/2019 1:15 PM Medical Record Number:[id number] Patient Account Number: [id number] Date of Birth/Sex: Treating RN: Apr 29, 1970 (48 y.o. Male) Yevonne Pax Primary Care Konor Noren: [name] Other Clinician: Referring Perri Lamagna: Treating Gabriana Wilmott/Extender:Robson, Charolotte Capuchin, Ellin Mayhew in Treatment: 0 Allergies Active Allergies No Known Allergies Allergy Notes Electronic Signature(s) Signed: 05/14/2019 5:28:23 PM By: Yevonne Pax RN Entered By: Yevonne Pax on 05/13/2019 13:27:56 -------------------------------------------------------------------------------- Arrival Information Details Patient Name: Date of Service: [name], [name] 05/13/2019 1:15 PM Medical Record Number:[id number] Patient Account Number: [id number] Date of Birth/Sex: Treating RN: Feb 28, 1970 (48 y.o. Male) Yevonne Pax Primary Care Laasia Arcos: [name] Other Clinician: Referring Leida Luton: Treating Nimco Bivens/Extender:Robson, Charolotte Capuchin, Ellin Mayhew in Treatment: 0 Visit Information Patient Arrived: Ambulatory Arrival Time: 13:13 Accompanied By: self Transfer Assistance: None Patient Identification Verified: Yes Secondary Verification Process Completed: Yes Patient Requires Transmission-Based No Precautions: Patient Has Alerts: No Electronic Signature(s) Signed: 05/14/2019 5:28:23 PM By: Yevonne Pax RN Entered By: Yevonne Pax on 05/13/2019 13:26:39 -------------------------------------------------------------------------------- Clinic Level of Care Assessment Details Patient Name: Date of Service: [name], [name] 05/13/2019 1:15 PM Medical Record Number:[id number] Patient Account Number: [id number] Date of Birth/Sex: Treating RN: Aug 09, 1970 (48 y.o. Male) Zandra Abts Primary Care Junius Faucett: [name] Other Clinician: Referring  Tasheka Houseman: Treating Kendrew Paci/Extender:Robson, Charolotte Capuchin, Ellin Mayhew in Treatment: 0 Clinic Level of Care Assessment Items TOOL 1 Quantity Score X - Use when EandM and Procedure is performed on INITIAL visit 1 0 ASSESSMENTS - Nursing Assessment / Reassessment X - General Physical Exam (combine w/ comprehensive assessment (listed just below) 1 20 1 20  when performed on new pt. evals) X - Comprehensive Assessment (HX, ROS, Risk Assessments, Wounds Hx, etc.) 1 25 ASSESSMENTS - Wound and Skin Assessment / Reassessment []  - Dermatologic / Skin Assessment (not related to wound area) 0 ASSESSMENTS - Ostomy and/or Continence Assessment and Care []  - Incontinence Assessment and Management 0 []  - Ostomy Care Assessment and Management (repouching, etc.) 0 PROCESS - Coordination of Care X - Simple Patient / Family Education for ongoing care 1 15 []  - Complex (extensive) Patient / Family Education for ongoing care 0 X - Staff obtains , Records, Test Results / Process Orders 1 10 []  - Staff telephones HHA, Nursing Homes / Clarify orders / etc 0 []  - Routine Transfer to another Facility (non-emergent condition) 0 []  - Routine Hospital Admission (non-emergent condition) 0 X - New Admissions / / Ordering NPWT, Apligraf, etc. 1 15 []  - Emergency Hospital Admission (emergent condition) 0 PROCESS - Special Needs []  - Pediatric / Minor Patient Management 0 []  - Isolation Patient Management 0 []  - Hearing / Language / Visual special needs 0 []  - Assessment of Community assistance (transportation, D/C planning, etc.) 0 []  - Additional assistance / Altered mentation 0 []  - Support Surface(s) Assessment (bed, cushion, seat, etc.) 0 INTERVENTIONS - Miscellaneous []  - External ear exam 0 []  - Patient Transfer (multiple staff / / Similar devices) 0 []  - Simple Staple / Suture removal (25 or less) 0 []  - Complex Staple / Suture removal (26 or more) 0 []  -  Hypo/Hyperglycemic Management (do not check if billed separately) 0 X - Ankle / Brachial Index (ABI) - do not check if billed separately 1 15 Has the patient been seen at the hospital within the last three years: Yes Total Score: 100 Level Of Care: New/Established - Level 3 Electronic Signature(s) Signed: 05/13/2019  5:33:33 PM By: Levan Hurst RN, BSN Entered By: Levan Hurst on 05/13/2019 14:20:29 -------------------------------------------------------------------------------- Encounter Discharge Information Details Patient Name: Date of Service: [name], [name] 05/13/2019 1:15 PM Medical Record [id number] Patient Account Number: [id number] Date of Birth/Sex: Treating RN: 01-Sep-1970 (48 y.o. Male) Kela Millin Primary Care Enid Maultsby: Stoney Bang Other Clinician: Referring Delshawn Stech: Treating Nusayba Cadenas/Extender:Robson, Lyn Hollingshead, Areta Haber in Treatment: 0 Encounter Discharge Information Items Post Procedure Vitals Discharge Condition: Stable Temperature (F): 98.6 Ambulatory Status: Ambulatory Pulse (bpm): 86 Discharge Destination: Home Respiratory Rate (breaths/min): 18 Transportation: Private Auto Blood Pressure (mmHg): 121/67 Accompanied By: self Schedule Follow-up Appointment: Yes Clinical Summary of Care: Patient Declined Electronic Signature(s) Signed: 05/14/2019 5:26:58 PM By: Kela Millin Entered By: Kela Millin on 05/13/2019 14:30:34 -------------------------------------------------------------------------------- Lower Extremity Assessment Details Patient Name: Date of Service: [name], [name] 05/13/2019 1:15 PM Medical Record [id number] Patient Account Number: [id number] Date of Birth/Sex: Treating RN: [birth year] (48 y.o. Male) Carlene Coria Primary Care Riven Beebe: Stoney Bang Other Clinician: Referring Jenai Scaletta: Treating Josey Forcier/Extender:Robson, Lyn Hollingshead, Areta Haber in Treatment: 0 Edema  Assessment Assessed: [Left: No] [Right: No] E[Left: dema] [Right: :] Calf Left: Right: Point of Measurement: 47 cm From Medial Instep 40 cm cm Ankle Left: Right: Point of Measurement: 11 cm From Medial Instep 26 cm cm Vascular Assessment Blood Pressure: Brachial: [Left:121] Ankle: [Left:Dorsalis Pedis: 110 0.91] Electronic Signature(s) Signed: 05/14/2019 5:28:23 PM By: Carlene Coria RN Entered By: Carlene Coria on 05/13/2019 13:47:29 -------------------------------------------------------------------------------- Multi Wound Chart Details Patient Name: Date of Service: [name] 05/13/2019 1:15 PM Medical Record [id number] Patient Account Number: [id number] Date of Birth/Sex: Treating RN: [birth year] (48 y.o. Male) Levan Hurst Primary Care Guiseppe Flanagan: Stoney Bang Other Clinician: Referring Ashlee Bewley: Treating Jafet Wissing/Extender:Robson, Lyn Hollingshead, Areta Haber in Treatment: 0 Vital Signs Height(in): 73 Pulse(bpm): 62 Weight(lbs): 301 Blood Pressure(mmHg):121/67 Body Mass Index(BMI): 40 Temperature(F): 98.6 Respiratory 18 Rate(breaths/min): Photos: [1:No Photos] [2:No Photos] [N/A:N/A] Wound Location: [1:Left Toe Great] [2:Left Toe Fourth] [N/A:N/A] Wounding Event: [1:Gradually Appeared] [2:Gradually Appeared] [N/A:N/A] Primary Etiology: [1:Diabetic Wound/Ulcer of the Diabetic Wound/Ulcer of the N/A Lower Extremity] [2:Lower Extremity] Comorbid History: [1:Sleep Apnea, Congestive Sleep Apnea, Congestive N/A Heart Failure, Hypertension, Heart Failure, Hypertension, Type II Diabetes] [2:Type II Diabetes] Date Acquired: [1:02/18/2019] [2:02/18/2019] [N/A:N/A] Weeks of Treatment: [1:0] [2:0] [N/A:N/A] Wound Status: [1:Open] [2:Open] [N/A:N/A] Measurements L x W x D 0.1x0.2x0.1 [2:0.2x0.2x0.1] [N/A:N/A] (cm) Area (cm) : [1:0.016] [2:0.031] [N/A:N/A] Volume (cm) : [1:0.002] [2:0.003] [N/A:N/A] Classification: [1:Grade 2] [2:Grade 2] [N/A:N/A] Exudate  Amount: [1:Medium] [2:Medium] [N/A:N/A] Exudate Type: [1:Serosanguineous] [2:Serosanguineous] [N/A:N/A] Exudate Color: [1:red, brown] [2:red, brown] [N/A:N/A] Granulation Amount: [1:Large (67-100%)] [2:Large (67-100%)] [N/A:N/A] Granulation Quality: [1:Red] [2:Red] [N/A:N/A] Necrotic Amount: [1:None Present (0%)] [2:N/A] [N/A:N/A] Exposed Structures: [1:Fat Layer (Subcutaneous Fat Layer (Subcutaneous N/A Tissue) Exposed: Yes Fascia: No Tendon: No Muscle: No Joint: No Bone: No] [2:Tissue) Exposed: Yes Fascia: No Tendon: No Muscle: No Joint: No Bone: No] Epithelialization: [1:Small (1-33%)] [2:None] [N/A:N/A] Debridement: [1:Debridement - Excisional Debridement - Excisional N/A] Pre-procedure [1:14:14] [2:14:14] [N/A:N/A] Verification/Time Out Taken: Tissue Debrided: [1:Callus, Subcutaneous] [2:Callus, Subcutaneous] [N/A:N/A] Level: [1:Skin/Subcutaneous Tissue Skin/Subcutaneous Tissue N/A] Debridement Area (sq cm):0.02 [2:0.04] [N/A:N/A] Instrument: [1:Curette] [2:Curette] [N/A:N/A] Bleeding: [1:Minimum] [2:Minimum] [N/A:N/A] Hemostasis Achieved: [1:Pressure] [2:Pressure] [N/A:N/A] Procedural Pain: [1:0] [2:0] [N/A:N/A] Post Procedural Pain: [1:0] [2:0] [N/A:N/A] Debridement Treatment Procedure was tolerated [2:Procedure was tolerated] [N/A:N/A] Response: [1:well] [2:well] Post Debridement [1:0.1x0.2x0.1] [2:0.2x0.2x0.1] [N/A:N/A] Measurements L x W x D (cm) Post Debridement [1:0.002] [2:0.003] [N/A:N/A] Volume: (cm) Procedures Performed: Debridement [2:Debridement] [N/A:N/A] Treatment Notes Wound #1 (Left Toe Great) 1.  Cleanse With Wound Cleanser 3. Primary Dressing Applied Collegen [employer] 4. Secondary Dressing Dry Gauze Roll Gauze 5. Secured With Tape Notes netting Wound #2 (Left Toe Fourth) 1. Cleanse With Wound Cleanser 3. Primary Dressing Applied Colle[employer] 4. Secondary Dressing Dry Gauze Roll Gauze 5. Secured With Tape Notes Ambulance person) Signed: 05/13/2019 5:33:33 PM By: Zandra Abts RN, BSN Signed: 05/13/2019 5:40:04 PM By: Baltazar Najjar MD Entered By: Baltazar Najjar on 05/13/2019 14:48:01 -------------------------------------------------------------------------------- Multi-Disciplinary Care Plan Details Patient Name: Date of Service: [name], [name] 05/13/2019 1:15 PM Medical Record Number:[id number] Patient Account Number: [id number] Date of Birth/Sex: Treating RN: [birth year] (48 y.o. Male) Zandra Abts Primary Care Latif Nazareno: [name] Other Clinician: Referring Immaculate Crutcher: Treating Ellie Bryand/Extender:Robson, Charolotte Capuchin, Ellin Mayhew in Treatment: 0 Active Inactive Nutrition Nursing Diagnoses: Imbalanced nutrition Impaired glucose control: actual or potential Potential for alteratiion in Nutrition/Potential for imbalanced nutrition Goals: Patient/caregiver agrees to and verbalizes understanding of need to use nutritional supplements and/or vitamins as prescribed Date Initiated: 05/13/2019 Target Resolution Date: 06/14/2019 Goal Status: Active Patient/caregiver will maintain therapeutic glucose control Date Initiated: 05/13/2019 Target Resolution Date: 06/14/2019 Goal Status: Active Interventions: Assess HgA1c results as ordered upon admission and as needed Assess patient nutrition upon admission and as needed per policy Provide education on elevated blood sugars and impact on wound healing Provide education on nutrition Treatment Activities: Education provided on Nutrition : 05/13/2019 Notes: Wound/Skin Impairment Nursing Diagnoses: Impaired tissue integrity Knowledge deficit related to smoking impact on wound healing Knowledge deficit related to ulceration/compromised skin integrity Goals: Patient will demonstrate a reduced rate of smoking or cessation of smoking Date Initiated: 05/13/2019 Target Resolution Date: 06/14/2019 Goal Status: Active Patient/caregiver will verbalize  understanding of skin care regimen Date Initiated: 05/13/2019 Target Resolution Date: 06/14/2019 Goal Status: Active Interventions: Assess patient/caregiver ability to obtain necessary supplies Assess patient/caregiver ability to perform ulcer/skin care regimen upon admission and as needed Assess ulceration(s) every visit Provide education on smoking Provide education on ulcer and skin care Notes: Electronic Signature(s) Signed: 05/13/2019 5:33:33 PM By: Zandra Abts RN, BSN Entered By: Zandra Abts on 05/13/2019 14:21:03 -------------------------------------------------------------------------------- Patient/Caregiver Education Details Patient Name: [name] Date of Service: 4/26/2021andnbsp1:15 PM Medical Record Number:[id number] Patient Account Number: [id number] Date of Birth/Gender: [birth year] (48 y.o. Male) Treating RN: Zandra Abts Primary Care Physician: [name] Other Clinician: Referring Physician: Treating Physician/Extender:Robson, Charolotte Capuchin, Ellin Mayhew in Treatment: 0 Education Assessment Education Provided To: Patient Education Topics Provided Elevated Blood Sugar/ Impact on Healing: Methods: Explain/Verbal Responses: State content correctly Nutrition: Methods: Explain/Verbal Responses: State content correctly Wound/Skin Impairment: Methods: Explain/Verbal Responses: State content correctly Electronic Signature(s) Signed: 05/13/2019 5:33:33 PM By: Zandra Abts RN, BSN Entered By: Zandra Abts on 05/13/2019 14:18:00 -------------------------------------------------------------------------------- Wound Assessment Details Patient Name: Date of Service: [name], [name] 05/13/2019 1:15 PM Medical Record Number:[id number] Patient Account Number: [id number] Date of Birth/Sex: Treating RN: [birth year] (48 y.o. Male) Yevonne Pax Primary Care Jelani Vreeland: [name] Other Clinician: Referring Equan Cogbill: Treating  Harley Mccartney/Extender:Robson, Charolotte Capuchin, Ellin Mayhew in Treatment: 0 Wound Status Wound Number: 1 Primary Diabetic Wound/Ulcer of the Lower Extremity Wound Location: Left Toe Great Etiology: Wounding Event: Gradually Appeared Wound Open Date Acquired: 02/18/2019 Status: Weeks Of Treatment:0 Comorbid Sleep Apnea, Congestive Heart Failure, Clustered Wound: No History: Hypertension, Type II Diabetes Photos Photo Uploaded By: Benjaman Kindler on 05/14/2019 13:44:32 Wound Measurements Length: (cm) 0.1 Width: (cm) 0.2 Depth: (cm) 0.1 Area: (cm) 0.016 Volume: (cm) 0.002 Wound Description Classification: Grade 2 Exudate Amount: Medium Exudate Type: Serosanguineous Exudate Color:  red, brown Wound Bed Granulation Amount: Large (67-100%) Granulation Quality: Red Necrotic Amount: None Present (0%) r After Cleansing: No ibrino No Exposed Structure sed: No Subcutaneous Tissue) Exposed: Yes sed: No sed: No ed: No d: No % Reduction in Area: % Reduction in Volume: Epithelialization: Small (1-33%) Tunneling: No Undermining: No Foul Odo Slough/F Fascia Expo Fat Layer ( Tendon Expo Muscle Expo Joint Expos Bone Expose Treatment Notes Wound #1 (Left Toe Great) 1. Cleanse With Wound Cleanser 3. Primary Dressing Applied Collegen [employer] 4. Secondary Dressing Dry Gauze Roll Gauze 5. Secured With Tape Notes Government social research officer) Signed: 05/14/2019 5:28:23 PM By: Yevonne Pax RN Entered By: Yevonne Pax on 05/13/2019 13:49:19 -------------------------------------------------------------------------------- Wound Assessment Details Patient Name: Date of Service: [name], [name] 05/13/2019 1:15 PM Medical Record Number:[id number] Patient Account Number: [id number] Date of Birth/Sex: Treating RN: Dec 24, 1970 (48 y.o. Male) Yevonne Pax Primary Care Rakeya Glab: [name] Other Clinician: Referring Caven Perine: Treating Thorne Wirz/Extender:Robson, Charolotte Capuchin,  Ellin Mayhew in Treatment: 0 Wound Status Wound Number: 2 Primary Diabetic Wound/Ulcer of the Lower Extremity Wound Location: Left Toe Fourth Etiology: Wounding Event: Gradually Appeared Wound Open Date Acquired: 02/18/2019 Status: Weeks Of Treatment:0 Comorbid Sleep Apnea, Congestive Heart Failure, Clustered Wound: No History: Hypertension, Type II Diabetes Photos Photo Uploaded By: Benjaman Kindler on 05/14/2019 13:44:33 Wound Measurements Length: (cm) 0.2 % Reduction Width: (cm) 0.2 % Reduction Depth: (cm) 0.1 Epithelializ Area: (cm) 0.031 Tunneling: Volume: (cm) 0.003 Undermining Wound Description Classification: Grade 2 Foul Odor A Exudate Amount: Medium Slough/Fibr Exudate Type: Serosanguineous Exudate Color: red, brown Wound Bed Granulation Amount: Large (67-100%) Granulation Quality: Red Fascia Expo Fat Layer ( Tendon Expo Muscle Expo Joint Expos Bone Expose fter Cleansing: No ino No Exposed Structure sed: No Subcutaneous Tissue) Exposed: Yes sed: No sed: No ed: No d: No in Area: in Volume: ation: None No : No Treatment Notes Wound #2 (Left Toe Fourth) 1. Cleanse With Wound Cleanser 3. Primary Dressing Applied [employer]le[employer] 4. Secondary Dressing Dry Gauze Roll Gauze 5. Secured With Tape Notes Government social research officer) Signed: 05/14/2019 5:28:23 PM By: Yevonne Pax RN Entered By: Yevonne Pax on 05/13/2019 13:51:18 -------------------------------------------------------------------------------- Vitals Details Patient Name: Date of Service: [name], [name] 05/13/2019 1:15 PM Medical Record Number:[id number] Patient Account Number: [id number] Date of Birth/Sex: Treating RN: 08/25/1970 (48 y.o. Male) Yevonne Pax Primary Care Jacquita Mulhearn: [name] Other Clinician: Referring Aleza Pew: Treating Myleen Brailsford/Extender:Robson, Charolotte Capuchin, Ellin Mayhew in Treatment: 0 Vital Signs Time Taken: 13:26 Temperature (F): 98.6 Height (in):  73 Pulse (bpm): 86 Source: Stated Respiratory Rate (breaths/min): 18 Weight (lbs): 301 Blood Pressure (mmHg): 121/67 Source: Stated Reference Range: 80 - 120 mg / dl Body Mass Index (BMI): 39.7 Electronic Signature(s) Signed: 05/14/2019 5:28:23 PM By: Yevonne Pax RN Entered By: Yevonne Pax on 05/13/2019 13:27:11

## 2019-05-14 NOTE — Progress Notes (Signed)
[name], [name] ([id number]) Visit Report for 05/13/2019 Abuse/Suicide Risk Screen Details Patient Name: Date of Service: [name], [name] 05/13/2019 1:15 PM Medical Record [id number] Patient Account Number: [id number] Date of Birth/Sex: Treating RN: 1970/02/24 (49 y.o. Male) Carlene Coria Primary Care Jemiah Ellenburg: Stoney Bang Other Clinician: Referring Jyoti Harju: Treating Isolde Skaff/Extender:Robson, Lyn Hollingshead, Areta Haber in Treatment: 0 Abuse/Suicide Risk Screen Items Answer ABUSE RISK SCREEN: Has anyone close to you tried to hurt or harm you recentlyo No Do you feel uncomfortable with anyone in your familyo No Has anyone forced you do things that you didnt want to doo No Electronic Signature(s) Signed: 05/14/2019 5:28:23 PM By: Carlene Coria RN Entered By: Carlene Coria on 05/13/2019 13:33:32 -------------------------------------------------------------------------------- Activities of Daily Living Details Patient Name: Date of Service: [name], [name] 05/13/2019 1:15 PM Medical Record [id number] Patient Account Number: [id number] Date of Birth/Sex: Treating RN: Jun 02, 1970 (48 y.o. Male) Carlene Coria Primary Care Hairo Garraway: Stoney Bang Other Clinician: Referring Shantella Blubaugh: Treating Janisse Ghan/Extender:Robson, Lyn Hollingshead, Areta Haber in Treatment: 0 Activities of Daily Living Items Answer Activities of Daily Living (Please select one for each item) Drive Automobile Completely Able Take Medications Completely Able Use Telephone Completely Able Care for Appearance Completely Able Use Toilet Completely Able Bath / Shower Completely Able Dress Self Completely Able Feed Self Completely Able Walk Completely Able Get In / Out Bed Completely Able Housework Completely Able Prepare Meals Completely Able Handle Money Completely Able Shop for Self Completely Able Electronic Signature(s) Signed: 05/14/2019 5:28:23 PM By: Carlene Coria RN Entered By:  Carlene Coria on 05/13/2019 13:33:55 -------------------------------------------------------------------------------- Education Screening Details Patient Name: Date of Service: [name], [name] 05/13/2019 1:15 PM Medical Record [id number] Patient Account Number: [id number] Date of Birth/Sex: Treating RN: [birth year] (48 y.o. Male) Carlene Coria Primary Care Kaj Vasil: Stoney Bang Other Clinician: Referring Roscoe Witts: Treating Cellie Dardis/Extender:Robson, Lyn Hollingshead, Areta Haber in Treatment: 0 Primary Learner Assessed: Patient Learning Preferences/Education Level/Primary Language Learning Preference: Explanation Highest Education Level: High School Preferred Language: English Cognitive Barrier Language Barrier: No Translator Needed: No Memory Deficit: No Emotional Barrier: No Cultural/Religious Beliefs Affecting Medical Care: No Physical Barrier Impaired Vision: No Impaired Hearing: No Decreased Hand dexterity: No Knowledge/Comprehension Knowledge Level: Medium Comprehension Level: High Ability to understand written High instructions: Ability to understand verbal High instructions: Motivation Anxiety Level: Calm Cooperation: Cooperative Education Importance: Acknowledges Need Interest in Health Problems: Asks Questions Perception: Coherent Willingness to Engage in Self- High Management Activities: Readiness to Engage in Self- High Management Activities: Electronic Signature(s) Signed: 05/14/2019 5:28:23 PM By: Carlene Coria RN Entered By: Carlene Coria on 05/13/2019 13:34:23 -------------------------------------------------------------------------------- Fall Risk Assessment Details Patient Name: Date of Service: [name], [name] 05/13/2019 1:15 PM Medical Record [id number] Patient Account Number: [id number] Date of Birth/Sex: Treating RN: 1970/03/07 (48 y.o. Male) Carlene Coria Primary Care Merrell Rettinger: Stoney Bang Other Clinician: Referring  Lynel Forester: Treating Tyreque Finken/Extender:Robson, Lyn Hollingshead, Areta Haber in Treatment: 0 Fall Risk Assessment Items Have you had 2 or more falls in the last 12 monthso 0 No Have you had any fall that resulted in injury in the last 12 monthso 0 No FALLS RISK SCREEN History of falling - immediate or within 3 months 0 No Secondary diagnosis (Do you have 2 or more medical diagnoseso) 0 No Ambulatory aid None/bed rest/wheelchair/nurse 0 No Crutches/cane/walker 0 No Furniture 0 No Intravenous therapy Access/Saline/Heparin Lock 0 No Weak (short steps with or without shuffle, stooped but able to lift head 0 No while walking, may seek support from furniture) Impaired (short steps with shuffle, may have difficulty  arising from chair, 0 No head down, impaired balance) Mental Status Oriented to own ability 0 No Overestimates or forgets limitations 0 No Risk Level: Low Risk Score: 0 Electronic Signature(s) Signed: 05/14/2019 5:28:23 PM By: Yevonne Pax RN Entered By: Yevonne Pax on 05/13/2019 13:34:34 -------------------------------------------------------------------------------- Foot Assessment Details Patient Name: Date of Service: [name], [name] 05/13/2019 1:15 PM Medical Record Number:[id number] Patient Account Number: [id number] Date of Birth/Sex: Treating RN: 07/25/1970 (48 y.o. Male) Yevonne Pax Primary Care Aryanah Enslow: [name] Other Clinician: Referring Laketia Vicknair: Treating Seiji Wiswell/Extender:Robson, Charolotte Capuchin, Ellin Mayhew in Treatment: 0 Foot Assessment Items Site Locations + = Sensation present, - = Sensation absent, C = Callus, U = Ulcer R = Redness, W = Warmth, M = Maceration, PU = Pre-ulcerative lesion F = Fissure, S = Swelling, D = Dryness Assessment Right: Left: Other Deformity: No No Prior Foot Ulcer: No No Prior Amputation: No No Charcot Joint: No No Ambulatory Status: Ambulatory Without Help Gait: Steady Electronic Signature(s) Signed: 05/14/2019  5:28:23 PM By: Yevonne Pax RN Entered By: Yevonne Pax on 05/13/2019 13:36:49 -------------------------------------------------------------------------------- Nutrition Risk Screening Details Patient Name: Date of Service: [name], [name] 05/13/2019 1:15 PM Medical Record Number:[id number] Patient Account Number: [id number] Date of Birth/Sex: Treating RN: 12-06-1970 (48 y.o. Male) Yevonne Pax Primary Care Omir Cooprider: [name] Other Clinician: Referring Madisyn Mawhinney: Treating Ramesh Moan/Extender:Robson, Charolotte Capuchin, Ellin Mayhew in Treatment: 0 Height (in): 73 Weight (lbs): 301 Body Mass Index (BMI): 39.7 Nutrition Risk Screening Items Score Screening NUTRITION RISK SCREEN: I have an illness or condition that made me change the kind and/or amount 0 No of food I eat I eat fewer than two meals per day 0 No I eat few fruits and vegetables, or milk products 0 No I have three or more drinks of beer, liquor or wine almost every day 0 No I have tooth or mouth problems that make it hard for me to eat 0 No I don't always have enough money to buy the food I need 0 No I eat alone most of the time 0 No I take three or more different prescribed or over-the-counter drugs a day 1 Yes 0 No Without wanting to, I have lost or gained 10 pounds in the last six months I am not always physically able to shop, cook and/or feed myself 0 No Nutrition Protocols Good Risk Protocol 0 No interventions needed Moderate Risk Protocol High Risk Proctocol Risk Level: Good Risk Score: 1 Electronic Signature(s) Signed: 05/14/2019 5:28:23 PM By: Yevonne Pax RN Entered By: Yevonne Pax on 05/13/2019 13:35:34

## 2019-05-20 ENCOUNTER — Other Ambulatory Visit: Payer: Self-pay

## 2019-05-20 ENCOUNTER — Encounter (HOSPITAL_BASED_OUTPATIENT_CLINIC_OR_DEPARTMENT_OTHER): Payer: Medicare PPO | Attending: Internal Medicine | Admitting: Internal Medicine

## 2019-05-20 DIAGNOSIS — E11621 Type 2 diabetes mellitus with foot ulcer: Secondary | ICD-10-CM | POA: Diagnosis not present

## 2019-05-20 DIAGNOSIS — Z89411 Acquired absence of right great toe: Secondary | ICD-10-CM | POA: Insufficient documentation

## 2019-05-20 DIAGNOSIS — I110 Hypertensive heart disease with heart failure: Secondary | ICD-10-CM | POA: Insufficient documentation

## 2019-05-20 DIAGNOSIS — I2510 Atherosclerotic heart disease of native coronary artery without angina pectoris: Secondary | ICD-10-CM | POA: Insufficient documentation

## 2019-05-20 DIAGNOSIS — L97522 Non-pressure chronic ulcer of other part of left foot with fat layer exposed: Secondary | ICD-10-CM | POA: Insufficient documentation

## 2019-05-20 DIAGNOSIS — E1151 Type 2 diabetes mellitus with diabetic peripheral angiopathy without gangrene: Secondary | ICD-10-CM | POA: Diagnosis not present

## 2019-05-20 DIAGNOSIS — I5022 Chronic systolic (congestive) heart failure: Secondary | ICD-10-CM | POA: Insufficient documentation

## 2019-05-20 DIAGNOSIS — E1142 Type 2 diabetes mellitus with diabetic polyneuropathy: Secondary | ICD-10-CM | POA: Insufficient documentation

## 2019-05-20 NOTE — Progress Notes (Signed)
GERSON, FAUTH ([id number]) Visit Report for 05/20/2019 Debridement Details Patient Name: Date of Service: [name], RA NDO [facility] 05/20/2019 2:45 PM Medical Record Number: [id number] Patient Account Number: [id number] Date of Birth/Sex: Treating RN: 05/17/1970 (49 y.o. Elizebeth Koller Primary Care Provider: HA SA NA Durward Fortes JE Other Clinician: Referring Provider: Treating Provider/Extender: Baltazar Najjar HA SA NA J, XA JE Weeks in Treatment: 1 Debridement Performed for Assessment: Wound #1 Left T Great oe Performed By: Physician Maxwell Caul., MD Debridement Type: Debridement Severity of Tissue Pre Debridement: Fat layer exposed Level of Consciousness (Pre-procedure): Awake and Alert Pre-procedure Verification/Time Out Yes - 15:32 Taken: Start Time: 15:32 T Area Debrided (L x W): otal 0.2 (cm) x 0.2 (cm) = 0.04 (cm) Tissue and other material debrided: Viable, Non-Viable, Callus, Subcutaneous Level: Skin/Subcutaneous Tissue Debridement Description: Excisional Instrument: Curette Bleeding: Minimum Hemostasis Achieved: Pressure End Time: 15:33 Procedural Pain: 0 Post Procedural Pain: 0 Response to Treatment: Procedure was tolerated well Level of Consciousness (Post- Awake and Alert procedure): Post Debridement Measurements of Total Wound Length: (cm) 0.2 Width: (cm) 0.2 Depth: (cm) 0.2 Volume: (cm) 0.006 Character of Wound/Ulcer Post Debridement: Improved Severity of Tissue Post Debridement: Fat layer exposed Post Procedure Diagnosis Same as Pre-procedure Electronic Signature(s) Signed: 05/20/2019 5:27:06 PM By: Zandra Abts RN, BSN Signed: 05/20/2019 5:33:20 PM By: Baltazar Najjar MD Entered By: Baltazar Najjar on 05/20/2019 16:31:29 -------------------------------------------------------------------------------- Debridement Details Patient Name: Date of Service: [name], RA NDO [facility] 05/20/2019 2:45 PM Medical Record Number: [id number] Patient Account  Number: [id number] Date of Birth/Sex: Treating RN: March 14, 1970 (49 y.o. Elizebeth Koller Primary Care Provider: HA SA NA Durward Fortes JE Other Clinician: Referring Provider: Treating Provider/Extender: Baltazar Najjar HA SA NA J, XA JE Weeks in Treatment: 1 Debridement Performed for Assessment: Wound #2 Left T Fourth oe Performed By: Physician Maxwell Caul., MD Debridement Type: Debridement Severity of Tissue Pre Debridement: Fat layer exposed Level of Consciousness (Pre-procedure): Awake and Alert Pre-procedure Verification/Time Out Yes - 15:32 Taken: Start Time: 15:32 T Area Debrided (L x W): otal 0.2 (cm) x 0.2 (cm) = 0.04 (cm) Tissue and other material debrided: Viable, Non-Viable, Callus, Subcutaneous Level: Skin/Subcutaneous Tissue Debridement Description: Excisional Instrument: Curette Bleeding: Minimum Hemostasis Achieved: Pressure End Time: 15:33 Procedural Pain: 0 Post Procedural Pain: 0 Response to Treatment: Procedure was tolerated well Level of Consciousness (Post- Awake and Alert procedure): Post Debridement Measurements of Total Wound Length: (cm) 0.2 Width: (cm) 0.2 Depth: (cm) 0.3 Volume: (cm) 0.009 Character of Wound/Ulcer Post Debridement: Improved Severity of Tissue Post Debridement: Fat layer exposed Post Procedure Diagnosis Same as Pre-procedure Electronic Signature(s) Signed: 05/20/2019 5:27:06 PM By: Zandra Abts RN, BSN Signed: 05/20/2019 5:33:20 PM By: Baltazar Najjar MD Entered By: Baltazar Najjar on 05/20/2019 16:31:41 -------------------------------------------------------------------------------- HPI Details Patient Name: Date of Service: [name], RA NDO [facility] 05/20/2019 2:45 PM Medical Record Number: [id number] Patient Account Number: [id number] Date of Birth/Sex: Treating RN: 02/01/1970 (49 y.o. Elizebeth Koller Primary Care Provider: HA SA NA Durward Fortes JE Other Clinician: Referring Provider: Treating Provider/Extender: Baltazar Najjar HA  SA NA J, XA JE Weeks in Treatment: 1 History of Present Illness HPI Description: ADMISSION 05/13/2019 This is a 49 year old man who has type 2 diabetes with peripheral neuropathy. He has a history of wounds on the plantar aspect of his left first and fourth toes. He has had these for several months. He was being seen in the wound care center at [facility] in [facility]  [place]. He apparently presented with swelling and an open wound at the tip of the toe. An MRI showed osteomyelitis. He was given 6 to 8 weeks of oral doxycycline again all of this via the patient we have none of these records. Since then he has been putting Gold[place] on the areas wearing regular shoes. He was seen by his primary physician on 4/9 and sent down here for our review. In the primary care note it says he has been recommended for hyperbaric oxygen. Past medical history includes type 2 diabetes with peripheral neuropathy, heart failure with reduced ejection fraction 30 to 35%, peripheral arterial disease, coronary artery disease, hyperlipidemia, hypertension, syncope and a prior history of a right great toe amputation also by Dr. Lynden Ang in [place] ABIs in our clinic were 0.91 on the left. Also notable that in 2019 he appears to have had arterial studies that are visible in our system. At that point the right ABI was 1.17, TBI of 0.85 with triphasic waveforms. On the left his ABI was 1.19 with a TBI of 0.73 again with triphasic waveform 5/3; we did receive some information from [facility] wound care. The patient was seen there on 1/29. At that point he had wounds on the right foot at the amputation site the fifth digit fourth digit I am presuming on the right and then an area on the left first digit although that is not specifically stated, he has had a previous amputation on the right however. It would appear him at that time most of his ulcers were on the right the left foot is stated to have a lot of scales and  calluses but not a lot of open wounds. The only wounds we define when he came in here last time were on the left first and left fourth toe He also had a wound culture that showed heavy growth of staph aureus and a slight growth of Stenotrophomonas maltophilia. The doxycycline should cover the staph aureus I am not sure about the stenotrophomonas. In any case he completed 6 weeks of this. UNFORTUNATELY I still do not have a copy of the MRI. And the exact justification for hyperbarics is therefore lacking. Electronic Signature(s) Signed: 05/20/2019 5:33:20 PM By: Baltazar Najjar MD Entered By: Baltazar Najjar on 05/20/2019 16:40:06 -------------------------------------------------------------------------------- Physical Exam Details Patient Name: Date of Service: [name], RA NDO [facility] 05/20/2019 2:45 PM Medical Record Number: [id number] Patient Account Number: [id number] Date of Birth/Sex: Treating RN: 11-04-1970 (49 y.o. Elizebeth Koller Primary Care Provider: HA SA NA Durward Fortes JE Other Clinician: Referring Provider: Treating Provider/Extender: Baltazar Najjar HA SA NA J, XA JE Weeks in Treatment: 1 Constitutional Sitting or standing Blood Pressure is within target range for patient.. Pulse regular and within target range for patient.Marland Kitchen Respirations regular, non-labored and within target range.. Temperature is normal and within the target range for the patient.Marland Kitchen Appears in no distress. Cardiovascular Fetal pulses are robust. Notes Wound exam; the area in question is on the tip of the left first and left fourth toes. Debrided with a #3 curette. There is barely anything open here today. Subcutaneous tissue removed from both wound beds. Minimal bleeding I did inspect the right foot today. There is no open wound. He has had a previous right first toe amputation nothing open at the amputation site Electronic Signature(s) Signed: 05/20/2019 5:33:20 PM By: Baltazar Najjar MD Entered By: Baltazar Najjar on 05/20/2019 16:41:43 -------------------------------------------------------------------------------- Physician Orders Details Patient Name: Date of Service: [name],  RA NDO [facility] 05/20/2019 2:45 PM Medical Record Number: [id number] Patient Account Number: [id number] Date of Birth/Sex: Treating RN: 1970-12-16 (49 y.o. Elizebeth Koller Primary Care Provider: HA SA NA Durward Fortes JE Other Clinician: Referring Provider: Treating Provider/Extender: Baltazar Najjar HA SA NA J, XA JE Weeks in Treatment: 1 Verbal / Phone Orders: No Diagnosis Coding ICD-10 Coding Code Description E11.621 Type 2 diabetes mellitus with foot ulcer L97.522 Non-pressure chronic ulcer of other part of left foot with fat layer exposed E11.42 Type 2 diabetes mellitus with diabetic polyneuropathy Follow-up Appointments Return Appointment in 1 week. Dressing Change Frequency Wound #1 Left T Great oe Change Dressing every other day. Wound #2 Left T Fourth oe Change Dressing every other day. Wound Cleansing Wound #1 Left T Great oe May shower and wash wound with soap and water. - on days that dressing is changed Wound #2 Left T Fourth oe May shower and wash wound with soap and water. - on days that dressing is changed Primary Wound Dressing Wound #1 Left T Great oe Calcium Alginate with Silver Wound #2 Left T Fourth oe Calcium Alginate with Silver Secondary Dressing Wound #1 Left T Great oe Kerlix/Rolled Gauze Dry Gauze Wound #2 Left T Fourth oe Kerlix/Rolled Gauze Dry Gauze Off-Loading Open toe surgical shoe to: - left foot Additional Orders / Instructions Stop/Decrease Smoking Electronic Signature(s) Signed: 05/20/2019 5:27:06 PM By: Zandra Abts RN, BSN Signed: 05/20/2019 5:33:20 PM By: Baltazar Najjar MD Entered By: Zandra Abts on 05/20/2019 15:36:12 -------------------------------------------------------------------------------- Problem List Details Patient Name: Date of  Service: [name], RA NDO [facility] 05/20/2019 2:45 PM Medical Record Number: [id number] Patient Account Number: [id number] Date of Birth/Sex: Treating RN: 1970/02/25 (49 y.o. Elizebeth Koller Primary Care Provider: HA SA NA Durward Fortes JE Other Clinician: Referring Provider: Treating Provider/Extender: Baltazar Najjar HA SA NA J, XA JE Weeks in Treatment: 1 Active Problems ICD-10 Encounter Code Description Active Date MDM Diagnosis E11.621 Type 2 diabetes mellitus with foot ulcer 05/13/2019 No Yes L97.522 Non-pressure chronic ulcer of other part of left foot with fat layer exposed 05/13/2019 No Yes E11.42 Type 2 diabetes mellitus with diabetic polyneuropathy 05/13/2019 No Yes Inactive Problems Resolved Problems Electronic Signature(s) Signed: 05/20/2019 5:33:20 PM By: Baltazar Najjar MD Entered By: Baltazar Najjar on 05/20/2019 16:31:12 -------------------------------------------------------------------------------- Progress Note Details Patient Name: Date of Service: [name], RA NDO [facility] 05/20/2019 2:45 PM Medical Record Number: [id number] Patient Account Number: [id number] Date of Birth/Sex: Treating RN: 02-19-1970 (49 y.o. Elizebeth Koller Primary Care Provider: HA SA NA Durward Fortes JE Other Clinician: Referring Provider: Treating Provider/Extender: Baltazar Najjar HA SA NA J, XA JE Weeks in Treatment: 1 Subjective History of Present Illness (HPI) ADMISSION 05/13/2019 This is a 49 year old man who has type 2 diabetes with peripheral neuropathy. He has a history of wounds on the plantar aspect of his left first and fourth toes. He has had these for several months. He was being seen in the wound care center at [facility] in [facility]. He apparently presented with swelling and an open wound at the tip of the toe. An MRI showed osteomyelitis. He was given 6 to 8 weeks of oral doxycycline again all of this via the patient we have none of these records. Since then he has been  putting Gold[place] on the areas wearing regular shoes. He was seen by his primary physician on 4/9 and sent down here for our review. In the primary care note it says he has been recommended  for hyperbaric oxygen. Past medical history includes type 2 diabetes with peripheral neuropathy, heart failure with reduced ejection fraction 30 to 35%, peripheral arterial disease, coronary artery disease, hyperlipidemia, hypertension, syncope and a prior history of a right great toe amputation also by Dr. Tye [place] in [place] in our clinic were 0.91 on the left. Also notable that in 2019 he appears to have had arterial studies that are visible in our system. At that point the right ABI was 1.17, TBI of 0.85 with triphasic waveforms. On the left his ABI was 1.19 with a TBI of 0.73 again with triphasic waveform 5/3; we did receive some information from [facility] wound care. The patient was seen there on 1/29. At that point he had wounds on the right foot at the amputation site the fifth digit fourth digit I am presuming on the right and then an area on the left first digit although that is not specifically stated, he has had a previous amputation on the right however. It would appear him at that time most of his ulcers were on the right the left foot is stated to have a lot of scales and calluses but not a lot of open wounds. The only wounds we define when he came in here last time were on the left first and left fourth toe He also had a wound culture that showed heavy growth of staph aureus and a slight growth of Stenotrophomonas maltophilia. The doxycycline should cover the staph aureus I am not sure about the stenotrophomonas. In any case he completed 6 weeks of this. UNFORTUNATELY I still do not have a copy of the MRI. And the exact justification for hyperbarics is therefore lacking. Objective Constitutional Sitting or standing Blood Pressure is within target range for patient.. Pulse regular and within  target range for patient.Marland Kitchen Respirations regular, non-labored and within target range.. Temperature is normal and within the target range for the patient.Marland Kitchen Appears in no distress. Vitals Time Taken: 2:58 PM, Height: 73 in, Weight: 301 lbs, BMI: 39.7, Temperature: 98.5 F, Pulse: 84 bpm, Respiratory Rate: 18 breaths/min, Blood Pressure: 122/66 mmHg. Cardiovascular Fetal pulses are robust. General Notes: Wound exam; the area in question is on the tip of the left first and left fourth toes. Debrided with a #3 curette. There is barely anything open here today. Subcutaneous tissue removed from both wound beds. Minimal bleeding ooI did inspect the right foot today. There is no open wound. He has had a previous right first toe amputation nothing open at the amputation site Integumentary (Hair, Skin) Wound #1 status is Open. Original cause of wound was Gradually Appeared. The wound is located on the Left T Great. The wound measures 0.2cm length x oe 0.2cm width x 0.2cm depth; 0.031cm^2 area and 0.006cm^3 volume. There is Fat Layer (Subcutaneous Tissue) Exposed exposed. There is no tunneling or undermining noted. There is a medium amount of serosanguineous drainage noted. The wound margin is flat and intact. There is large (67-100%) pink granulation within the wound bed. There is no necrotic tissue within the wound bed. Wound #2 status is Open. Original cause of wound was Gradually Appeared. The wound is located on the Left T Fourth. The wound measures 0.2cm length x oe 0.2cm width x 0.3cm depth; 0.031cm^2 area and 0.009cm^3 volume. There is Fat Layer (Subcutaneous Tissue) Exposed exposed. There is no tunneling or undermining noted. There is a medium amount of serosanguineous drainage noted. There is small (1-33%) pink granulation within the wound bed. There is  a large (67-100%) amount of necrotic tissue within the wound bed including Eschar and Adherent Slough. Assessment Active Problems ICD-10 Type  2 diabetes mellitus with foot ulcer Non-pressure chronic ulcer of other part of left foot with fat layer exposed Type 2 diabetes mellitus with diabetic polyneuropathy Procedures Wound #1 Pre-procedure diagnosis of Wound #1 is a Diabetic Wound/Ulcer of the Lower Extremity located on the Left T Great .Severity of Tissue Pre Debridement is: oe Fat layer exposed. There was a Excisional Skin/Subcutaneous Tissue Debridement with a total area of 0.04 sq cm performed by Maxwell Caulobson, Maddoxx Burkitt G., MD. With the following instrument(s): Curette to remove Viable and Non-Viable tissue/material. Material removed includes Callus and Subcutaneous Tissue and. No specimens were taken. A time out was conducted at 15:32, prior to the start of the procedure. A Minimum amount of bleeding was controlled with Pressure. The procedure was tolerated well with a pain level of 0 throughout and a pain level of 0 following the procedure. Post Debridement Measurements: 0.2cm length x 0.2cm width x 0.2cm depth; 0.006cm^3 volume. Character of Wound/Ulcer Post Debridement is improved. Severity of Tissue Post Debridement is: Fat layer exposed. Post procedure Diagnosis Wound #1: Same as Pre-Procedure Wound #2 Pre-procedure diagnosis of Wound #2 is a Diabetic Wound/Ulcer of the Lower Extremity located on the Left T Fourth .Severity of Tissue Pre Debridement is: oe Fat layer exposed. There was a Excisional Skin/Subcutaneous Tissue Debridement with a total area of 0.04 sq cm performed by Maxwell Caulobson, Bless Belshe G., MD. With the following instrument(s): Curette to remove Viable and Non-Viable tissue/material. Material removed includes Callus and Subcutaneous Tissue and. No specimens were taken. A time out was conducted at 15:32, prior to the start of the procedure. A Minimum amount of bleeding was controlled with Pressure. The procedure was tolerated well with a pain level of 0 throughout and a pain level of 0 following the procedure. Post Debridement  Measurements: 0.2cm length x 0.2cm width x 0.3cm depth; 0.009cm^3 volume. Character of Wound/Ulcer Post Debridement is improved. Severity of Tissue Post Debridement is: Fat layer exposed. Post procedure Diagnosis Wound #2: Same as Pre-Procedure Plan Follow-up Appointments: Return Appointment in 1 week. Dressing Change Frequency: Wound #1 Left T Great: oe Change Dressing every other day. Wound #2 Left T Fourth: oe Change Dressing every other day. Wound Cleansing: Wound #1 Left T Great: oe May shower and wash wound with soap and water. - on days that dressing is changed Wound #2 Left T Fourth: oe May shower and wash wound with soap and water. - on days that dressing is changed Primary Wound Dressing: Wound #1 Left T Great: oe Calcium Alginate with Silver Wound #2 Left T Fourth: oe Calcium Alginate with Silver Secondary Dressing: Wound #1 Left T Great: oe Kerlix/Rolled Gauze Dry Gauze Wound #2 Left T Fourth: oe Kerlix/Rolled Gauze Dry Gauze Off-Loading: Open toe surgical shoe to: - left foot Additional Orders / Instructions: Stop/Decrease Smoking 1. We will apply silver alginate to both of these areas that may be closed next week. 2. It would appear that the right foot MRI showed a abscess at the base of the second metatarsal phalanx. On the left side there was soft tissue swelling about the fourth toe with underlying osteomyelitis of the distal fourth phalanx no abscess. 3. He completed 6 weeks of doxycycline. Indeed according to Dr. Gerilyn Nestleathy's text the osteomyelitis was in the left fourth toe wide although I do not have the actual reports 4. I think the wounds are going to close  on the left first and left fourth toes. I really at this point do not feel strongly about hyperbarics unless things reopen. 5. He is using his surgical shoe Electronic Signature(s) Signed: 05/20/2019 5:33:20 PM By: Baltazar Najjar MD Entered By: Baltazar Najjar on 05/20/2019  16:44:07 -------------------------------------------------------------------------------- SuperBill Details Patient Name: Date of Service: [name], RA NDO [facility] 05/20/2019 Medical Record Number: [id number] Patient Account Number: [id number] Date of Birth/Sex: Treating RN: Mar 26, 1970 (49 y.o. 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 E11.42 Type 2 diabetes mellitus with diabetic polyneuropathy Facility Procedures CPT4 Code: [id number] Description: 11042 - DEB SUBQ TISSUE 20 SQ CM/< ICD-10 Diagnosis Description L97.522 Non-pressure chronic ulcer of other part of left foot with fat layer exposed Modifier: Quantity: 1 Physician Procedures : CPT4 Code Description Modifier [id number] 11042 - WC PHYS SUBQ TISS 20 SQ CM ICD-10 Diagnosis Description L97.522 Non-pressure chronic ulcer of 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

## 2019-05-27 ENCOUNTER — Encounter (HOSPITAL_BASED_OUTPATIENT_CLINIC_OR_DEPARTMENT_OTHER): Payer: Medicare PPO | Admitting: Internal Medicine

## 2019-05-27 DIAGNOSIS — E1151 Type 2 diabetes mellitus with diabetic peripheral angiopathy without gangrene: Secondary | ICD-10-CM | POA: Diagnosis not present

## 2019-05-27 DIAGNOSIS — L97522 Non-pressure chronic ulcer of other part of left foot with fat layer exposed: Secondary | ICD-10-CM | POA: Diagnosis not present

## 2019-05-27 DIAGNOSIS — E11621 Type 2 diabetes mellitus with foot ulcer: Secondary | ICD-10-CM | POA: Diagnosis not present

## 2019-05-27 DIAGNOSIS — E1142 Type 2 diabetes mellitus with diabetic polyneuropathy: Secondary | ICD-10-CM | POA: Diagnosis not present

## 2019-05-27 DIAGNOSIS — Z89411 Acquired absence of right great toe: Secondary | ICD-10-CM | POA: Diagnosis not present

## 2019-05-27 DIAGNOSIS — I5022 Chronic systolic (congestive) heart failure: Secondary | ICD-10-CM | POA: Diagnosis not present

## 2019-05-27 DIAGNOSIS — I110 Hypertensive heart disease with heart failure: Secondary | ICD-10-CM | POA: Diagnosis not present

## 2019-05-27 DIAGNOSIS — I2510 Atherosclerotic heart disease of native coronary artery without angina pectoris: Secondary | ICD-10-CM | POA: Diagnosis not present

## 2019-05-28 NOTE — Progress Notes (Signed)
YITZCHOK, CARRIGER ([id number]) Visit Report for 05/27/2019 HPI Details Patient Name: Date of Service: [name], RA NDO [facility] 05/27/2019 2:30 PM Medical Record Number: [id number] Patient Account Number: [id number] Date of Birth/Sex: Treating RN: [birth year] (49 y.o. Elizebeth Koller Primary Care Provider: HA SA NA Durward Fortes JE Other Clinician: Referring Provider: Treating Provider/Extender: Baltazar Najjar HA SA NA J, XA JE Weeks in Treatment: 2 History of Present Illness HPI Description: ADMISSION 05/13/2019 This is a 49 year old man who has type 2 diabetes with peripheral neuropathy. He has a history of wounds on the plantar aspect of his left first and fourth toes. He has had these for several months. He was being seen in the wound care center at [facility] in [facility]. He apparently presented with swelling and an open wound at the tip of the toe. An MRI showed osteomyelitis. He was given 6 to 8 weeks of oral doxycycline again all of this via the patient we have none of these records. Since then he has been putting Goldbond on the areas wearing regular shoes. He was seen by his primary physician on 4/9 and sent down here for our review. In the primary care note it says he has been recommended for hyperbaric oxygen. Past medical history includes type 2 diabetes with peripheral neuropathy, heart failure with reduced ejection fraction 30 to 35%, peripheral arterial disease, coronary artery disease, hyperlipidemia, hypertension, syncope and a prior history of a right great toe amputation also by Dr. Lynden Ang in [place] ABIs in our clinic were 0.91 on the left. Also notable that in 2019 he appears to have had arterial studies that are visible in our system. At that point the right ABI was 1.17, TBI of 0.85 with triphasic waveforms. On the left his ABI was 1.19 with a TBI of 0.73 again with triphasic waveform 5/3; we did receive some information from [facility] wound care. The  patient was seen there on 1/29. At that point he had wounds on the right foot at the amputation site the fifth digit fourth digit I am presuming on the right and then an area on the left first digit although that is not specifically stated, he has had a previous amputation on the right however. It would appear him at that time most of his ulcers were on the right the left foot is stated to have a lot of scales and calluses but not a lot of open wounds. The only wounds we define when he came in here last time were on the left first and left fourth toe He also had a wound culture that showed heavy growth of staph aureus and a slight growth of Stenotrophomonas maltophilia. The doxycycline should cover the staph aureus I am not sure about the stenotrophomonas. In any case he completed 6 weeks of this. UNFORTUNATELY I still do not have a copy of the MRI. And the exact justification for hyperbarics is therefore lacking. 5/10; the patient had osteomyelitis in the left fourth toe. He was treated for 6 weeks of doxycycline this wound is healed as is the left first toe. He was sent here for hyperbaric oxygen however at this point his wounds are healed and I think a course of watchful waiting and observation is in order. If the left fourth toe reopens then it is likely he will need a more protracted and aggressive treatment approach Electronic Signature(s) Signed: 05/28/2019 8:11:21 AM By: Baltazar Najjar MD Entered By: Baltazar Najjar on 05/27/2019 15:47:33 -------------------------------------------------------------------------------- Physical  Exam Details Patient Name: Date of Service: [name], RA NDO [facility] 05/27/2019 2:30 PM Medical Record Number: [id number] Patient Account Number: [id number] Date of Birth/Sex: Treating RN: March 07, 1970 (49 y.o. Elizebeth Koller Primary Care Provider: HA SA NA Durward Fortes JE Other Clinician: Referring Provider: Treating Provider/Extender: Baltazar Najjar HA SA NA J, XA  JE Weeks in Treatment: 2 Constitutional Patient is hypertensive.. Pulse regular and within target range for patient.Marland Kitchen Respirations regular, non-labored and within target range.. Temperature is normal and within the target range for the patient.Marland Kitchen Appears in no distress. Cardiovascular pedal pulses are palpable on the left. Notes wound exam; the area in question was on the tip of the first and fourth toes on the left. Both of these are revealed. there is no surrounding tenderness no erythema Electronic Signature(s) Signed: 05/28/2019 8:11:21 AM By: Baltazar Najjar MD Entered By: Baltazar Najjar on 05/27/2019 16:04:01 -------------------------------------------------------------------------------- Physician Orders Details Patient Name: Date of Service: [name], RA NDO [facility] 05/27/2019 2:30 PM Medical Record Number: [id number] Patient Account Number: [id number] Date of Birth/Sex: Treating RN: 10-17-1970 (49 y.o. Elizebeth Koller Primary Care Provider: HA SA NA Durward Fortes JE Other Clinician: Referring Provider: Treating Provider/Extender: Baltazar Najjar HA SA NA J, XA JE Weeks in Treatment: 2 Verbal / Phone Orders: No Diagnosis Coding ICD-10 Coding Code Description E11.621 Type 2 diabetes mellitus with foot ulcer L97.522 Non-pressure chronic ulcer of other part of left foot with fat layer exposed E11.42 Type 2 diabetes mellitus with diabetic polyneuropathy Discharge From [facility] Services Discharge from Wound Care Center Primary Wound Dressing Other: - keep toes padded with foam for protection Additional Orders / Instructions Stop/Decrease Smoking Electronic Signature(s) Signed: 05/28/2019 8:11:21 AM By: Baltazar Najjar MD Signed: 05/28/2019 5:17:28 PM By: Zandra Abts RN, BSN Entered By: Zandra Abts on 05/27/2019 15:28:43 -------------------------------------------------------------------------------- Problem List Details Patient Name: Date of Service: [name], RA NDO [facility]  05/27/2019 2:30 PM Medical Record Number: [id number] Patient Account Number: [id number] Date of Birth/Sex: Treating RN: 28-Sep-1970 (49 y.o. Elizebeth Koller Primary Care Provider: HA SA NA Durward Fortes JE Other Clinician: Referring Provider: Treating Provider/Extender: Baltazar Najjar HA SA NA J, XA JE Weeks in Treatment: 2 Active Problems ICD-10 Encounter Code Description Active Date MDM Diagnosis E11.621 Type 2 diabetes mellitus with foot ulcer 05/13/2019 No Yes L97.522 Non-pressure chronic ulcer of other part of left foot with fat layer exposed 05/13/2019 No Yes E11.42 Type 2 diabetes mellitus with diabetic polyneuropathy 05/13/2019 No Yes Inactive Problems Resolved Problems Electronic Signature(s) Signed: 05/28/2019 8:11:21 AM By: Baltazar Najjar MD Entered By: Baltazar Najjar on 05/27/2019 15:45:41 -------------------------------------------------------------------------------- Progress Note Details Patient Name: Date of Service: [name], RA NDO [facility] 05/27/2019 2:30 PM Medical Record Number: [id number] Patient Account Number: [id number] Date of Birth/Sex: Treating RN: 26-Jun-1970 (49 y.o. Elizebeth Koller Primary Care Provider: HA SA NA Durward Fortes JE Other Clinician: Referring Provider: Treating Provider/Extender: Baltazar Najjar HA SA NA J, XA JE Weeks in Treatment: 2 Subjective History of Present Illness (HPI) ADMISSION 05/13/2019 This is a 49 year old man who has type 2 diabetes with peripheral neuropathy. He has a history of wounds on the plantar aspect of his left first and fourth toes. He has had these for several months. He was being seen in the wound care center at [facility] in [facility]. He apparently presented with swelling and an open wound at the tip of the toe. An MRI showed osteomyelitis. He was given 6 to 8 weeks of oral  doxycycline again all of this via the patient we have none of these records. Since then he has been putting [place] on the areas  wearing regular shoes. He was seen by his primary physician on 4/9 and sent down here for our review. In the primary care note it says he has been recommended for hyperbaric oxygen. Past medical history includes type 2 diabetes with peripheral neuropathy, heart failure with reduced ejection fraction 30 to 35%, peripheral arterial disease, coronary artery disease, hyperlipidemia, hypertension, syncope and a prior history of a right great toe amputation also by Dr. Lynden Ang in [place] ABIs in our clinic were 0.91 on the left. Also notable that in 2019 he appears to have had arterial studies that are visible in our system. At that point the right ABI was 1.17, TBI of 0.85 with triphasic waveforms. On the left his ABI was 1.19 with a TBI of 0.73 again with triphasic waveform 5/3; we did receive some information from [facility] wound care. The patient was seen there on 1/29. At that point he had wounds on the right foot at the amputation site the fifth digit fourth digit I am presuming on the right and then an area on the left first digit although that is not specifically stated, he has had a previous amputation on the right however. It would appear him at that time most of his ulcers were on the right the left foot is stated to have a lot of scales and calluses but not a lot of open wounds. The only wounds we define when he came in here last time were on the left first and left fourth toe He also had a wound culture that showed heavy growth of staph aureus and a slight growth of Stenotrophomonas maltophilia. The doxycycline should cover the staph aureus I am not sure about the stenotrophomonas. In any case he completed 6 weeks of this. UNFORTUNATELY I still do not have a copy of the MRI. And the exact justification for hyperbarics is therefore lacking. 5/10; the patient had osteomyelitis in the left fourth toe. He was treated for 6 weeks of doxycycline this wound is healed as is the left first toe. He was  sent here for hyperbaric oxygen however at this point his wounds are healed and I think a course of watchful waiting and observation is in order. If the left fourth toe reopens then it is likely he will need a more protracted and aggressive treatment approach Objective Constitutional Patient is hypertensive.. Pulse regular and within target range for patient.Marland Kitchen Respirations regular, non-labored and within target range.. Temperature is normal and within the target range for the patient.Marland Kitchen Appears in no distress. Vitals Time Taken: 2:25 PM, Height: 73 in, Weight: 301 lbs, BMI: 39.7, Temperature: 99 F, Pulse: 106 bpm, Respiratory Rate: 18 breaths/min, Blood Pressure: 164/77 mmHg. Cardiovascular pedal pulses are palpable on the left. General Notes: wound exam; the area in question was on the tip of the first and fourth toes on the left. Both of these are revealed. there is no surrounding tenderness no erythema Integumentary (Hair, Skin) Wound #1 status is Open. Original cause of wound was Gradually Appeared. The wound is located on the Left T Great. The wound measures 0cm length x oe 0cm width x 0cm depth; 0cm^2 area and 0cm^3 volume. There is no tunneling or undermining noted. There is a none present amount of drainage noted. The wound margin is flat and intact. There is no granulation within the wound bed. There is  no necrotic tissue within the wound bed. Wound #2 status is Open. Original cause of wound was Gradually Appeared. The wound is located on the Left T Fourth. The wound measures 0cm length x oe 0cm width x 0cm depth; 0cm^2 area and 0cm^3 volume. There is no tunneling or undermining noted. There is a none present amount of drainage noted. The wound margin is distinct with the outline attached to the wound base. There is no granulation within the wound bed. There is no necrotic tissue within the wound bed. Assessment Active Problems ICD-10 Type 2 diabetes mellitus with foot  ulcer Non-pressure chronic ulcer of other part of left foot with fat layer exposed Type 2 diabetes mellitus with diabetic polyneuropathy Plan Discharge From [facility] Services: Discharge from [place] Primary Wound Dressing: Other: - keep toes padded with foam for protection Additional Orders / Instructions: Stop/Decrease Smoking #1 the patient is discharged from the wound care center #2 I carefully explained the concept of "healed" versus "closed". Right now he is closed and I would continue to observe this carefully over the next 9-12 months. #3 he was advised to keep the area padded and his shoes Electronic Signature(s) Signed: 05/28/2019 8:11:21 AM By: Linton Ham MD Entered By: Linton Ham on 05/27/2019 16:05:43 -------------------------------------------------------------------------------- SuperBill Details Patient Name: Date of Service: [name], RA NDO [facility] 05/27/2019 Medical Record Number: [id number] Patient Account Number: [id number] Date of Birth/Sex: Treating RN: 05-01-1970 (49 y.o. Janyth Contes Primary Care Provider: HA SA NA Kendra Opitz JE Other Clinician: Referring Provider: Treating Provider/Extender: Linton Ham HA SA NA J, XA JE Weeks in Treatment: 2 Diagnosis Coding ICD-10 Codes Code Description E11.621 Type 2 diabetes mellitus with foot ulcer L97.522 Non-pressure chronic ulcer of other part of left foot with fat layer exposed E11.42 Type 2 diabetes mellitus with diabetic polyneuropathy Facility Procedures CPT4 Code: [id number] Description: 99213 - WOUND CARE VISIT-LEV 3 EST PT Modifier: Quantity: 1 Physician Procedures : CPT4 Code Description Modifier [id number] 99213 - WC PHYS LEVEL 3 - EST PT ICD-10 Diagnosis Description E11.621 Type 2 diabetes mellitus with foot ulcer L97.522 Non-pressure chronic ulcer of other part of left foot with fat layer exposed E11.42 Type 2  diabetes mellitus with diabetic polyneuropathy Quantity: 1 Electronic  Signature(s) Signed: 05/28/2019 5:17:28 PM By: Levan Hurst RN, BSN Signed: 05/28/2019 6:13:26 PM By: Linton Ham MD Previous Signature: 05/28/2019 8:11:21 AM Version By: Linton Ham MD Entered By: Levan Hurst on 05/28/2019 08:34:29

## 2019-05-28 NOTE — Progress Notes (Signed)
JAIMIN, KRUPKA ([id number]) Visit Report for 05/27/2019 Arrival Information Details Patient Name: Date of Service: Gilford Rile NDO [facility] 05/27/2019 2:30 PM Medical Record Number: [id number] Patient Account Number: [id number] Date of Birth/Sex: Treating RN: 11-Mar-1970 (49 y.o. Katherina Right Primary Care Leonel Mccollum: HA SA NA Durward Fortes JE Other Clinician: Referring Andria Head: Treating Samnang Shugars/Extender: Baltazar Najjar HA SA NA J, XA JE Weeks in Treatment: 2 Visit Information History Since Last Visit Added or deleted any medications: No Patient Arrived: Ambulatory Any new allergies or adverse reactions: No Arrival Time: 14:25 Had a fall or experienced change in No Accompanied By: self activities of daily living that may affect Transfer Assistance: None risk of falls: Patient Identification Verified: Yes Signs or symptoms of abuse/neglect since No Secondary Verification Process Completed: Yes last visito Patient Requires Transmission-Based Precautions: No Hospitalized since last visit: No Patient Has Alerts: No Implantable device outside of the clinic No excluding cellular tissue based products placed in the center since last visit: Has Dressing in Place as Prescribed: Yes Has Footwear/Offloading in Place as Yes Prescribed: Left: Surgical Shoe with Pressure Relief Insole Pain Present Now: No Electronic Signature(s) Signed: 05/28/2019 5:14:55 PM By: Cherylin Mylar Entered By: Cherylin Mylar on 05/27/2019 14:26:15 -------------------------------------------------------------------------------- Clinic Level of Care Assessment Details Patient Name: Date of Service: [name] RA NDO [facility] 05/27/2019 2:30 PM Medical Record Number: [id number] Patient Account Number: [id number] Date of Birth/Sex: Treating RN: April 17, 1970 (49 y.o. Elizebeth Koller Primary Care Davionne Dowty: HA SA NA Durward Fortes JE Other Clinician: Referring Tasheem Elms: Treating Matson Welch/Extender: Baltazar Najjar HA SA NA J, XA JE Weeks in Treatment: 2 Clinic Level of Care Assessment Items TOOL 4 Quantity Score X- 1 0 Use when only an EandM is performed on FOLLOW-UP visit ASSESSMENTS - Nursing Assessment / Reassessment X- 1 10 Reassessment of Co-morbidities (includes updates in patient status) X- 1 5 Reassessment of Adherence to Treatment Plan ASSESSMENTS - Wound and Skin A ssessment / Reassessment []  - 0 Simple Wound Assessment / Reassessment - one wound X- 2 5 Complex Wound Assessment / Reassessment - multiple wounds []  - 0 Dermatologic / Skin Assessment (not related to wound area) ASSESSMENTS - Focused Assessment []  - 0 Circumferential Edema Measurements - multi extremities []  - 0 Nutritional Assessment / Counseling / Intervention X- 1 5 Lower Extremity Assessment (monofilament, tuning fork, pulses) []  - 0 Peripheral Arterial Disease Assessment (using hand held doppler) ASSESSMENTS - Ostomy and/or Continence Assessment and Care []  - 0 Incontinence Assessment and Management []  - 0 Ostomy Care Assessment and Management (repouching, etc.) PROCESS - Coordination of Care X - Simple Patient / Family Education for ongoing care 1 15 []  - 0 Complex (extensive) Patient / Family Education for ongoing care X- 1 10 Staff obtains , Records, T Results / Process Orders est []  - 0 Staff telephones HHA, Nursing Homes / Clarify orders / etc []  - 0 Routine Transfer to another Facility (non-emergent condition) []  - 0 Routine Hospital Admission (non-emergent condition) []  - 0 New Admissions / / Ordering NPWT Apligraf, etc. , []  - 0 Emergency Hospital Admission (emergent condition) X- 1 10 Simple Discharge Coordination []  - 0 Complex (extensive) Discharge Coordination PROCESS - Special Needs []  - 0 Pediatric / Minor Patient Management []  - 0 Isolation Patient Management []  - 0 Hearing / Language / Visual special needs []  - 0 Assessment of  Community assistance (transportation, D/C planning, etc.) []  - 0 Additional assistance / Altered mentation []  - 0 Support  Surface(s) Assessment (bed, cushion, seat, etc.) INTERVENTIONS - Wound Cleansing / Measurement []  - 0 Simple Wound Cleansing - one wound X- 2 5 Complex Wound Cleansing - multiple wounds X- 1 5 Wound Imaging (photographs - any number of wounds) []  - 0 Wound Tracing (instead of photographs) []  - 0 Simple Wound Measurement - one wound X- 2 5 Complex Wound Measurement - multiple wounds INTERVENTIONS - Wound Dressings []  - 0 Small Wound Dressing one or multiple wounds []  - 0 Medium Wound Dressing one or multiple wounds []  - 0 Large Wound Dressing one or multiple wounds []  - 0 Application of Medications - topical []  - 0 Application of Medications - injection INTERVENTIONS - Miscellaneous []  - 0 External ear exam []  - 0 Specimen Collection (cultures, biopsies, blood, body fluids, etc.) []  - 0 Specimen(s) / Culture(s) sent or taken to Lab for analysis []  - 0 Patient Transfer (multiple staff / Civil Service fast streamer / Similar devices) []  - 0 Simple Staple / Suture removal (25 or less) []  - 0 Complex Staple / Suture removal (26 or more) []  - 0 Hypo / Hyperglycemic Management (close monitor of Blood Glucose) []  - 0 Ankle / Brachial Index (ABI) - do not check if billed separately X- 1 5 Vital Signs Has the patient been seen at the hospital within the last three years: Yes Total Score: 95 Level Of Care: New/Established - Level 3 Electronic Signature(s) Signed: 05/28/2019 5:17:28 PM By: Levan Hurst RN, BSN Entered By: Levan Hurst on 05/28/2019 08:34:19 -------------------------------------------------------------------------------- Encounter Discharge Information Details Patient Name: Date of Service: [name], RA NDO [facility] 05/27/2019 2:30 PM Medical Record Number: [id number] Patient Account Number: [id number] Date of Birth/Sex: Treating RN: 07/14/1970 (49  y.o. Janyth Contes Primary Care Eaven Schwager: HA SA NA Kendra Opitz JE Other Clinician: Referring Sivan Quast: Treating Elayah Klooster/Extender: Linton Ham HA SA NA J, XA JE Weeks in Treatment: 2 Encounter Discharge Information Items Discharge Condition: Stable Ambulatory Status: Ambulatory Discharge Destination: Home Transportation: Private Auto Accompanied By: alone Schedule Follow-up Appointment: Yes Clinical Summary of Care: Patient Declined Electronic Signature(s) Signed: 05/28/2019 5:17:28 PM By: Levan Hurst RN, BSN Entered By: Levan Hurst on 05/28/2019 08:34:48 -------------------------------------------------------------------------------- Lower Extremity Assessment Details Patient Name: Date of Service: [name], RA NDO [facility] 05/27/2019 2:30 PM Medical Record Number: [id number] Patient Account Number: [id number] Date of Birth/Sex: Treating RN: 1970/03/06 (49 y.o. Marvis Repress Primary Care Bharat Antillon: HA SA NA Kendra Opitz JE Other Clinician: Referring Derrell Milanes: Treating Daichi Moris/Extender: Linton Ham HA SA NA J, XA JE Weeks in Treatment: 2 Edema Assessment Assessed: [Left: No] [Right: No] Edema: [Left: N] [Right: o] Calf Left: Right: Point of Measurement: 47 cm From Medial Instep 39 cm cm Ankle Left: Right: Point of Measurement: 11 cm From Medial Instep 24 cm cm Vascular Assessment Pulses: Dorsalis Pedis Palpable: [Left:Yes] Electronic Signature(s) Signed: 05/28/2019 5:14:55 PM By: Kela Millin Entered By: Kela Millin on 05/27/2019 14:29:21 -------------------------------------------------------------------------------- Multi Wound Chart Details Patient Name: Date of Service: [name], RA NDO [facility] 05/27/2019 2:30 PM Medical Record Number: [id number] Patient Account Number: [id number] Date of Birth/Sex: Treating RN: July 06, 1970 (49 y.o. Janyth Contes Primary Care Juanice Warburton: HA SA NA Kendra Opitz JE Other Clinician: Referring Marquice Uddin: Treating  Loyda Costin/Extender: Linton Ham HA SA NA J, XA JE Weeks in Treatment: 2 Vital Signs Height(in): 73 Pulse(bpm): 106 Weight(lbs): 301 Blood Pressure(mmHg): 164/77 Body Mass Index(BMI): 40 Temperature(F): 99 Respiratory Rate(breaths/min): 18 Photos: [1:No Photos Left T Great oe] [2:No Photos Left T Fourth oe] [N/A:N/A  N/A] Wound Location: [1:Gradually Appeared] [2:Gradually Appeared] [N/A:N/A] Wounding Event: [1:Diabetic Wound/Ulcer of the Lower] [2:Diabetic Wound/Ulcer of the Lower] [N/A:N/A] Primary Etiology: [1:Extremity Sleep Apnea, Congestive Heart] [2:Extremity Sleep Apnea, Congestive Heart] [N/A:N/A] Comorbid History: [1:Failure, Hypertension, Type II Diabetes 02/18/2019] [2:Failure, Hypertension, Type II Diabetes 02/18/2019] [N/A:N/A] Date Acquired: [1:2] [2:2] [N/A:N/A] Weeks of Treatment: [1:Open] [2:Open] [N/A:N/A] Wound Status: [1:0x0x0] [2:0x0x0] [N/A:N/A] Measurements L x W x D (cm) [1:0] [2:0] [N/A:N/A] A (cm) : rea [1:0] [2:0] [N/A:N/A] Volume (cm) : [1:100.00%] [2:100.00%] [N/A:N/A] % Reduction in A rea: [1:100.00%] [2:100.00%] [N/A:N/A] % Reduction in Volume: [1:Grade 2] [2:Grade 2] [N/A:N/A] Classification: [1:None Present] [2:None Present] [N/A:N/A] Exudate A mount: [1:Flat and Intact] [2:Distinct, outline attached] [N/A:N/A] Wound Margin: [1:None Present (0%)] [2:None Present (0%)] [N/A:N/A] Granulation A mount: [1:None Present (0%)] [2:None Present (0%)] [N/A:N/A] Necrotic A mount: [1:Fascia: No] [2:Fascia: No] [N/A:N/A] Exposed Structures: [1:Fat Layer (Subcutaneous Tissue) Exposed: No Tendon: No Muscle: No Joint: No Bone: No Small (1-33%)] [2:Fat Layer (Subcutaneous Tissue) Exposed: No Tendon: No Muscle: No Joint: No Bone: No Large (67-100%)] [N/A:N/A] Treatment Notes Electronic Signature(s) Signed: 05/28/2019 8:11:21 AM By: Baltazar Najjar MD Signed: 05/28/2019 5:17:28 PM By: Zandra Abts RN, BSN Entered By: Baltazar Najjar on 05/27/2019  15:45:52 -------------------------------------------------------------------------------- Multi-Disciplinary Care Plan Details Patient Name: Date of Service: [name], RA NDO [facility] 05/27/2019 2:30 PM Medical Record Number: [id number] Patient Account Number: [id number] Date of Birth/Sex: Treating RN: 1970/08/04 (49 y.o. Elizebeth Koller Primary Care Estrellita Lasky: HA SA NA Durward Fortes JE Other Clinician: Referring Augie Vane: Treating Ellean Firman/Extender: Baltazar Najjar HA SA NA J, XA JE Weeks in Treatment: 2 Active Inactive Electronic Signature(s) Signed: 05/28/2019 5:17:28 PM By: Zandra Abts RN, BSN Entered By: Zandra Abts on 05/27/2019 15:27:42 -------------------------------------------------------------------------------- Pain Assessment Details Patient Name: Date of Service: [name], RA NDO [facility] 05/27/2019 2:30 PM Medical Record Number: [id number] Patient Account Number: [id number] Date of Birth/Sex: Treating RN: [birth year] (49 y.o. Katherina Right Primary Care Obert Espindola: HA SA NA Durward Fortes JE Other Clinician: Referring Charmelle Soh: Treating Taia Bramlett/Extender: Baltazar Najjar HA SA NA J, XA JE Weeks in Treatment: 2 Active Problems Location of Pain Severity and Description of Pain Patient Has Paino No Site Locations Pain Management and Medication Current Pain Management: Electronic Signature(s) Signed: 05/28/2019 5:14:55 PM By: Cherylin Mylar Entered By: Cherylin Mylar on 05/27/2019 14:29:00 -------------------------------------------------------------------------------- Patient/Caregiver Education Details Patient Name: Date of Service: [name], [name] 5/10/2021andnbsp2:30 PM Medical Record Number: [id number] Patient Account Number: [id number] Date of Birth/Gender: Treating RN: 08-31-1970 (49 y.o. Elizebeth Koller Primary Care Physician: HA SA NA Durward Fortes JE Other Clinician: Referring Physician: Treating Physician/Extender: Baltazar Najjar HA SA NA J, XA  JE Weeks in Treatment: 2 Education Assessment Education Provided To: Patient Education Topics Provided Wound/Skin Impairment: Methods: Explain/Verbal Responses: State content correctly Electronic Signature(s) Signed: 05/28/2019 5:17:28 PM By: Zandra Abts RN, BSN Entered By: Zandra Abts on 05/27/2019 15:27:54 -------------------------------------------------------------------------------- Wound Assessment Details Patient Name: Date of Service: [name], RA NDO [facility] 05/27/2019 2:30 PM Medical Record Number: [id number] Patient Account Number: [id number] Date of Birth/Sex: Treating RN: 11-21-1970 (49 y.o. Katherina Right Primary Care Hayslee Casebolt: HA SA NA Durward Fortes JE Other Clinician: Referring Modesto Ganoe: Treating Hailea Eaglin/Extender: Baltazar Najjar HA SA NA J, XA JE Weeks in Treatment: 2 Wound Status Wound Number: 1 Primary Diabetic Wound/Ulcer of the Lower Extremity Etiology: Wound Location: Left T Great oe Wound Status: Open Wounding Event: Gradually Appeared Comorbid Sleep Apnea, Congestive Heart Failure, Hypertension, Type II Date Acquired:  02/18/2019 History: Diabetes Weeks Of Treatment: 2 Clustered Wound: No Photos Photo Uploaded By: Benjaman Kindler on 05/28/2019 10:55:31 Wound Measurements Length: (cm) Width: (cm) Depth: (cm) Area: (cm) Volume: (cm) 0 % Reduction in Area: 100% 0 % Reduction in Volume: 100% 0 Epithelialization: Small (1-33%) 0 Tunneling: No 0 Undermining: No Wound Description Classification: Grade 2 Wound Margin: Flat and Intact Exudate Amount: None Present Foul Odor After Cleansing: No Slough/Fibrino No Wound Bed Granulation Amount: None Present (0%) Exposed Structure Necrotic Amount: None Present (0%) Fascia Exposed: No Fat Layer (Subcutaneous Tissue) Exposed: No Tendon Exposed: No Muscle Exposed: No Joint Exposed: No Bone Exposed: No Electronic Signature(s) Signed: 05/28/2019 5:14:55 PM By: Cherylin Mylar Entered By:  Cherylin Mylar on 05/27/2019 14:29:42 -------------------------------------------------------------------------------- Wound Assessment Details Patient Name: Date of Service: [name], RA NDO [facility] 05/27/2019 2:30 PM Medical Record Number: [id number] Patient Account Number: [id number] Date of Birth/Sex: Treating RN: [birth year] (49 y.o. Katherina Right Primary Care Bevan Vu: HA SA NA Durward Fortes JE Other Clinician: Referring Vonnie Ligman: Treating Jahmez Bily/Extender: Baltazar Najjar HA SA NA J, XA JE Weeks in Treatment: 2 Wound Status Wound Number: 2 Primary Diabetic Wound/Ulcer of the Lower Extremity Etiology: Wound Location: Left T Fourth oe Wound Status: Open Wounding Event: Gradually Appeared Comorbid Sleep Apnea, Congestive Heart Failure, Hypertension, Type II Date Acquired: 02/18/2019 History: Diabetes Weeks Of Treatment: 2 Clustered Wound: No Photos Photo Uploaded By: Benjaman Kindler on 05/28/2019 10:55:31 Wound Measurements Length: (cm) Width: (cm) Depth: (cm) Area: (cm) Volume: (cm) 0 % Reduction in Area: 100% 0 % Reduction in Volume: 100% 0 Epithelialization: Large (67-100%) 0 Tunneling: No 0 Undermining: No Wound Description Classification: Grade 2 Wound Margin: Distinct, outline attached Exudate Amount: None Present Foul Odor After Cleansing: No Slough/Fibrino No Wound Bed Granulation Amount: None Present (0%) Exposed Structure Necrotic Amount: None Present (0%) Fascia Exposed: No Fat Layer (Subcutaneous Tissue) Exposed: No Tendon Exposed: No Muscle Exposed: No Joint Exposed: No Bone Exposed: No Electronic Signature(s) Signed: 05/28/2019 5:14:55 PM By: Cherylin Mylar Entered By: Cherylin Mylar on 05/27/2019 14:29:56 -------------------------------------------------------------------------------- Vitals Details Patient Name: Date of Service: [name], RA NDO [facility] 05/27/2019 2:30 PM Medical Record Number: [id number] Patient Account Number:  [id number] Date of Birth/Sex: Treating RN: 20-Nov-1970 (49 y.o. Katherina Right Primary Care Fortune Brannigan: HA SA NA Durward Fortes JE Other Clinician: Referring Taletha Twiford: Treating Melyna Huron/Extender: Baltazar Najjar HA SA NA J, XA JE Weeks in Treatment: 2 Vital Signs Time Taken: 14:25 Temperature (F): 99 Height (in): 73 Pulse (bpm): 106 Weight (lbs): 301 Respiratory Rate (breaths/min): 18 Body Mass Index (BMI): 39.7 Blood Pressure (mmHg): 164/77 Reference Range: 80 - 120 mg / dl Electronic Signature(s) Signed: 05/28/2019 5:14:55 PM By: Cherylin Mylar Entered By: Cherylin Mylar on 05/27/2019 14:28:54

## 2019-05-31 DIAGNOSIS — E1142 Type 2 diabetes mellitus with diabetic polyneuropathy: Secondary | ICD-10-CM | POA: Diagnosis not present

## 2019-05-31 DIAGNOSIS — I10 Essential (primary) hypertension: Secondary | ICD-10-CM | POA: Diagnosis not present

## 2019-05-31 DIAGNOSIS — E7849 Other hyperlipidemia: Secondary | ICD-10-CM | POA: Diagnosis not present

## 2019-06-28 DIAGNOSIS — I10 Essential (primary) hypertension: Secondary | ICD-10-CM | POA: Diagnosis not present

## 2019-06-28 DIAGNOSIS — E7849 Other hyperlipidemia: Secondary | ICD-10-CM | POA: Diagnosis not present

## 2019-06-28 DIAGNOSIS — E1142 Type 2 diabetes mellitus with diabetic polyneuropathy: Secondary | ICD-10-CM | POA: Diagnosis not present

## 2019-07-01 DIAGNOSIS — Z1211 Encounter for screening for malignant neoplasm of colon: Secondary | ICD-10-CM | POA: Diagnosis not present

## 2019-07-23 DIAGNOSIS — Z01818 Encounter for other preprocedural examination: Secondary | ICD-10-CM | POA: Diagnosis not present

## 2019-07-25 DIAGNOSIS — I2510 Atherosclerotic heart disease of native coronary artery without angina pectoris: Secondary | ICD-10-CM | POA: Diagnosis not present

## 2019-07-25 DIAGNOSIS — Z79899 Other long term (current) drug therapy: Secondary | ICD-10-CM | POA: Diagnosis not present

## 2019-07-25 DIAGNOSIS — K641 Second degree hemorrhoids: Secondary | ICD-10-CM | POA: Diagnosis not present

## 2019-07-25 DIAGNOSIS — I10 Essential (primary) hypertension: Secondary | ICD-10-CM | POA: Diagnosis not present

## 2019-07-25 DIAGNOSIS — K644 Residual hemorrhoidal skin tags: Secondary | ICD-10-CM | POA: Diagnosis not present

## 2019-07-25 DIAGNOSIS — K649 Unspecified hemorrhoids: Secondary | ICD-10-CM | POA: Diagnosis not present

## 2019-07-25 DIAGNOSIS — Z7984 Long term (current) use of oral hypoglycemic drugs: Secondary | ICD-10-CM | POA: Diagnosis not present

## 2019-07-25 DIAGNOSIS — E119 Type 2 diabetes mellitus without complications: Secondary | ICD-10-CM | POA: Diagnosis not present

## 2019-07-25 DIAGNOSIS — Z7982 Long term (current) use of aspirin: Secondary | ICD-10-CM | POA: Diagnosis not present

## 2019-07-25 DIAGNOSIS — G4730 Sleep apnea, unspecified: Secondary | ICD-10-CM | POA: Diagnosis not present

## 2019-07-25 DIAGNOSIS — Z1211 Encounter for screening for malignant neoplasm of colon: Secondary | ICD-10-CM | POA: Diagnosis not present

## 2019-07-25 DIAGNOSIS — D123 Benign neoplasm of transverse colon: Secondary | ICD-10-CM | POA: Diagnosis not present

## 2019-07-30 DIAGNOSIS — I10 Essential (primary) hypertension: Secondary | ICD-10-CM | POA: Diagnosis not present

## 2019-07-30 DIAGNOSIS — E1142 Type 2 diabetes mellitus with diabetic polyneuropathy: Secondary | ICD-10-CM | POA: Diagnosis not present

## 2019-07-30 DIAGNOSIS — E7849 Other hyperlipidemia: Secondary | ICD-10-CM | POA: Diagnosis not present

## 2019-08-13 DIAGNOSIS — L97519 Non-pressure chronic ulcer of other part of right foot with unspecified severity: Secondary | ICD-10-CM | POA: Diagnosis not present

## 2019-08-13 DIAGNOSIS — Z9889 Other specified postprocedural states: Secondary | ICD-10-CM | POA: Diagnosis not present

## 2019-08-13 DIAGNOSIS — E1169 Type 2 diabetes mellitus with other specified complication: Secondary | ICD-10-CM | POA: Diagnosis not present

## 2019-08-13 DIAGNOSIS — S92321A Displaced fracture of second metatarsal bone, right foot, initial encounter for closed fracture: Secondary | ICD-10-CM | POA: Diagnosis not present

## 2019-08-13 DIAGNOSIS — I2510 Atherosclerotic heart disease of native coronary artery without angina pectoris: Secondary | ICD-10-CM | POA: Diagnosis not present

## 2019-08-13 DIAGNOSIS — M868X7 Other osteomyelitis, ankle and foot: Secondary | ICD-10-CM | POA: Diagnosis not present

## 2019-08-13 DIAGNOSIS — Z7984 Long term (current) use of oral hypoglycemic drugs: Secondary | ICD-10-CM | POA: Diagnosis not present

## 2019-08-13 DIAGNOSIS — I10 Essential (primary) hypertension: Secondary | ICD-10-CM | POA: Diagnosis not present

## 2019-08-13 DIAGNOSIS — L97412 Non-pressure chronic ulcer of right heel and midfoot with fat layer exposed: Secondary | ICD-10-CM | POA: Diagnosis not present

## 2019-08-13 DIAGNOSIS — E11621 Type 2 diabetes mellitus with foot ulcer: Secondary | ICD-10-CM | POA: Diagnosis not present

## 2019-08-13 DIAGNOSIS — M7989 Other specified soft tissue disorders: Secondary | ICD-10-CM | POA: Diagnosis not present

## 2019-08-13 DIAGNOSIS — Z792 Long term (current) use of antibiotics: Secondary | ICD-10-CM | POA: Diagnosis not present

## 2019-08-13 DIAGNOSIS — E1140 Type 2 diabetes mellitus with diabetic neuropathy, unspecified: Secondary | ICD-10-CM | POA: Diagnosis not present

## 2019-08-23 DIAGNOSIS — I10 Essential (primary) hypertension: Secondary | ICD-10-CM | POA: Diagnosis not present

## 2019-08-23 DIAGNOSIS — M86171 Other acute osteomyelitis, right ankle and foot: Secondary | ICD-10-CM | POA: Diagnosis not present

## 2019-08-23 DIAGNOSIS — L97513 Non-pressure chronic ulcer of other part of right foot with necrosis of muscle: Secondary | ICD-10-CM | POA: Diagnosis not present

## 2019-08-23 DIAGNOSIS — M868X7 Other osteomyelitis, ankle and foot: Secondary | ICD-10-CM | POA: Diagnosis not present

## 2019-08-23 DIAGNOSIS — Z792 Long term (current) use of antibiotics: Secondary | ICD-10-CM | POA: Diagnosis not present

## 2019-08-23 DIAGNOSIS — E1169 Type 2 diabetes mellitus with other specified complication: Secondary | ICD-10-CM | POA: Diagnosis not present

## 2019-08-23 DIAGNOSIS — E1140 Type 2 diabetes mellitus with diabetic neuropathy, unspecified: Secondary | ICD-10-CM | POA: Diagnosis not present

## 2019-08-23 DIAGNOSIS — E11621 Type 2 diabetes mellitus with foot ulcer: Secondary | ICD-10-CM | POA: Diagnosis not present

## 2019-08-23 DIAGNOSIS — L97412 Non-pressure chronic ulcer of right heel and midfoot with fat layer exposed: Secondary | ICD-10-CM | POA: Diagnosis not present

## 2019-08-23 DIAGNOSIS — I2510 Atherosclerotic heart disease of native coronary artery without angina pectoris: Secondary | ICD-10-CM | POA: Diagnosis not present

## 2019-08-23 DIAGNOSIS — Z7984 Long term (current) use of oral hypoglycemic drugs: Secondary | ICD-10-CM | POA: Diagnosis not present

## 2019-09-05 DIAGNOSIS — E7849 Other hyperlipidemia: Secondary | ICD-10-CM | POA: Diagnosis not present

## 2019-09-05 DIAGNOSIS — E1142 Type 2 diabetes mellitus with diabetic polyneuropathy: Secondary | ICD-10-CM | POA: Diagnosis not present

## 2019-09-05 DIAGNOSIS — I10 Essential (primary) hypertension: Secondary | ICD-10-CM | POA: Diagnosis not present

## 2019-09-06 DIAGNOSIS — E1169 Type 2 diabetes mellitus with other specified complication: Secondary | ICD-10-CM | POA: Diagnosis not present

## 2019-09-06 DIAGNOSIS — I10 Essential (primary) hypertension: Secondary | ICD-10-CM | POA: Diagnosis not present

## 2019-09-06 DIAGNOSIS — M868X7 Other osteomyelitis, ankle and foot: Secondary | ICD-10-CM | POA: Diagnosis not present

## 2019-09-06 DIAGNOSIS — Z7984 Long term (current) use of oral hypoglycemic drugs: Secondary | ICD-10-CM | POA: Diagnosis not present

## 2019-09-06 DIAGNOSIS — Z792 Long term (current) use of antibiotics: Secondary | ICD-10-CM | POA: Diagnosis not present

## 2019-09-06 DIAGNOSIS — E1140 Type 2 diabetes mellitus with diabetic neuropathy, unspecified: Secondary | ICD-10-CM | POA: Diagnosis not present

## 2019-09-06 DIAGNOSIS — L97412 Non-pressure chronic ulcer of right heel and midfoot with fat layer exposed: Secondary | ICD-10-CM | POA: Diagnosis not present

## 2019-09-06 DIAGNOSIS — I2510 Atherosclerotic heart disease of native coronary artery without angina pectoris: Secondary | ICD-10-CM | POA: Diagnosis not present

## 2019-09-06 DIAGNOSIS — E11621 Type 2 diabetes mellitus with foot ulcer: Secondary | ICD-10-CM | POA: Diagnosis not present

## 2019-09-10 DIAGNOSIS — H40012 Open angle with borderline findings, low risk, left eye: Secondary | ICD-10-CM | POA: Diagnosis not present

## 2019-09-10 DIAGNOSIS — H40051 Ocular hypertension, right eye: Secondary | ICD-10-CM | POA: Diagnosis not present

## 2019-09-10 DIAGNOSIS — Z7984 Long term (current) use of oral hypoglycemic drugs: Secondary | ICD-10-CM | POA: Diagnosis not present

## 2019-09-10 DIAGNOSIS — H40053 Ocular hypertension, bilateral: Secondary | ICD-10-CM | POA: Diagnosis not present

## 2019-09-10 DIAGNOSIS — E119 Type 2 diabetes mellitus without complications: Secondary | ICD-10-CM | POA: Diagnosis not present

## 2019-09-10 DIAGNOSIS — H40011 Open angle with borderline findings, low risk, right eye: Secondary | ICD-10-CM | POA: Diagnosis not present

## 2019-09-10 DIAGNOSIS — I10 Essential (primary) hypertension: Secondary | ICD-10-CM | POA: Diagnosis not present

## 2019-09-10 DIAGNOSIS — H35033 Hypertensive retinopathy, bilateral: Secondary | ICD-10-CM | POA: Diagnosis not present

## 2019-09-10 DIAGNOSIS — H40013 Open angle with borderline findings, low risk, bilateral: Secondary | ICD-10-CM | POA: Diagnosis not present

## 2019-09-17 DIAGNOSIS — L97101 Non-pressure chronic ulcer of unspecified thigh limited to breakdown of skin: Secondary | ICD-10-CM | POA: Diagnosis not present

## 2019-09-25 DIAGNOSIS — E7849 Other hyperlipidemia: Secondary | ICD-10-CM | POA: Diagnosis not present

## 2019-09-25 DIAGNOSIS — I10 Essential (primary) hypertension: Secondary | ICD-10-CM | POA: Diagnosis not present

## 2019-09-25 DIAGNOSIS — E1142 Type 2 diabetes mellitus with diabetic polyneuropathy: Secondary | ICD-10-CM | POA: Diagnosis not present

## 2019-10-02 DIAGNOSIS — E7849 Other hyperlipidemia: Secondary | ICD-10-CM | POA: Diagnosis not present

## 2019-10-02 DIAGNOSIS — I5022 Chronic systolic (congestive) heart failure: Secondary | ICD-10-CM | POA: Diagnosis not present

## 2019-10-02 DIAGNOSIS — Z0000 Encounter for general adult medical examination without abnormal findings: Secondary | ICD-10-CM | POA: Diagnosis not present

## 2019-10-02 DIAGNOSIS — E1142 Type 2 diabetes mellitus with diabetic polyneuropathy: Secondary | ICD-10-CM | POA: Diagnosis not present

## 2019-10-02 DIAGNOSIS — I10 Essential (primary) hypertension: Secondary | ICD-10-CM | POA: Diagnosis not present

## 2019-10-02 DIAGNOSIS — G4730 Sleep apnea, unspecified: Secondary | ICD-10-CM | POA: Diagnosis not present

## 2019-10-04 DIAGNOSIS — E1142 Type 2 diabetes mellitus with diabetic polyneuropathy: Secondary | ICD-10-CM | POA: Diagnosis not present

## 2019-10-04 DIAGNOSIS — E7849 Other hyperlipidemia: Secondary | ICD-10-CM | POA: Diagnosis not present

## 2019-10-04 DIAGNOSIS — I5022 Chronic systolic (congestive) heart failure: Secondary | ICD-10-CM | POA: Diagnosis not present

## 2019-10-04 DIAGNOSIS — I10 Essential (primary) hypertension: Secondary | ICD-10-CM | POA: Diagnosis not present

## 2019-10-04 DIAGNOSIS — Z0000 Encounter for general adult medical examination without abnormal findings: Secondary | ICD-10-CM | POA: Diagnosis not present

## 2019-10-11 DIAGNOSIS — E11621 Type 2 diabetes mellitus with foot ulcer: Secondary | ICD-10-CM | POA: Diagnosis not present

## 2019-10-11 DIAGNOSIS — M868X7 Other osteomyelitis, ankle and foot: Secondary | ICD-10-CM | POA: Diagnosis not present

## 2019-10-11 DIAGNOSIS — E1169 Type 2 diabetes mellitus with other specified complication: Secondary | ICD-10-CM | POA: Diagnosis not present

## 2019-10-11 DIAGNOSIS — Z7982 Long term (current) use of aspirin: Secondary | ICD-10-CM | POA: Diagnosis not present

## 2019-10-11 DIAGNOSIS — Z7984 Long term (current) use of oral hypoglycemic drugs: Secondary | ICD-10-CM | POA: Diagnosis not present

## 2019-10-11 DIAGNOSIS — I2510 Atherosclerotic heart disease of native coronary artery without angina pectoris: Secondary | ICD-10-CM | POA: Diagnosis not present

## 2019-10-11 DIAGNOSIS — I10 Essential (primary) hypertension: Secondary | ICD-10-CM | POA: Diagnosis not present

## 2019-10-11 DIAGNOSIS — Z9049 Acquired absence of other specified parts of digestive tract: Secondary | ICD-10-CM | POA: Diagnosis not present

## 2019-10-11 DIAGNOSIS — Z79899 Other long term (current) drug therapy: Secondary | ICD-10-CM | POA: Diagnosis not present

## 2019-10-11 DIAGNOSIS — Z792 Long term (current) use of antibiotics: Secondary | ICD-10-CM | POA: Diagnosis not present

## 2019-10-11 DIAGNOSIS — L97412 Non-pressure chronic ulcer of right heel and midfoot with fat layer exposed: Secondary | ICD-10-CM | POA: Diagnosis not present

## 2019-10-11 DIAGNOSIS — E1140 Type 2 diabetes mellitus with diabetic neuropathy, unspecified: Secondary | ICD-10-CM | POA: Diagnosis not present

## 2019-10-21 DIAGNOSIS — E11621 Type 2 diabetes mellitus with foot ulcer: Secondary | ICD-10-CM | POA: Diagnosis not present

## 2019-10-31 DIAGNOSIS — E7849 Other hyperlipidemia: Secondary | ICD-10-CM | POA: Diagnosis not present

## 2019-10-31 DIAGNOSIS — I10 Essential (primary) hypertension: Secondary | ICD-10-CM | POA: Diagnosis not present

## 2019-10-31 DIAGNOSIS — E1142 Type 2 diabetes mellitus with diabetic polyneuropathy: Secondary | ICD-10-CM | POA: Diagnosis not present

## 2019-11-08 DIAGNOSIS — M868X7 Other osteomyelitis, ankle and foot: Secondary | ICD-10-CM | POA: Diagnosis not present

## 2019-11-08 DIAGNOSIS — E1140 Type 2 diabetes mellitus with diabetic neuropathy, unspecified: Secondary | ICD-10-CM | POA: Diagnosis not present

## 2019-11-08 DIAGNOSIS — Z7984 Long term (current) use of oral hypoglycemic drugs: Secondary | ICD-10-CM | POA: Diagnosis not present

## 2019-11-08 DIAGNOSIS — Z7982 Long term (current) use of aspirin: Secondary | ICD-10-CM | POA: Diagnosis not present

## 2019-11-08 DIAGNOSIS — L97412 Non-pressure chronic ulcer of right heel and midfoot with fat layer exposed: Secondary | ICD-10-CM | POA: Diagnosis not present

## 2019-11-08 DIAGNOSIS — I10 Essential (primary) hypertension: Secondary | ICD-10-CM | POA: Diagnosis not present

## 2019-11-08 DIAGNOSIS — Z9049 Acquired absence of other specified parts of digestive tract: Secondary | ICD-10-CM | POA: Diagnosis not present

## 2019-11-08 DIAGNOSIS — E1169 Type 2 diabetes mellitus with other specified complication: Secondary | ICD-10-CM | POA: Diagnosis not present

## 2019-11-08 DIAGNOSIS — E11621 Type 2 diabetes mellitus with foot ulcer: Secondary | ICD-10-CM | POA: Diagnosis not present

## 2019-11-08 DIAGNOSIS — Z792 Long term (current) use of antibiotics: Secondary | ICD-10-CM | POA: Diagnosis not present

## 2019-11-08 DIAGNOSIS — I2510 Atherosclerotic heart disease of native coronary artery without angina pectoris: Secondary | ICD-10-CM | POA: Diagnosis not present

## 2019-11-22 DIAGNOSIS — Z9049 Acquired absence of other specified parts of digestive tract: Secondary | ICD-10-CM | POA: Diagnosis not present

## 2019-11-22 DIAGNOSIS — Z794 Long term (current) use of insulin: Secondary | ICD-10-CM | POA: Diagnosis not present

## 2019-11-22 DIAGNOSIS — Z7982 Long term (current) use of aspirin: Secondary | ICD-10-CM | POA: Diagnosis not present

## 2019-11-22 DIAGNOSIS — I2510 Atherosclerotic heart disease of native coronary artery without angina pectoris: Secondary | ICD-10-CM | POA: Diagnosis not present

## 2019-11-22 DIAGNOSIS — I10 Essential (primary) hypertension: Secondary | ICD-10-CM | POA: Diagnosis not present

## 2019-11-22 DIAGNOSIS — F17210 Nicotine dependence, cigarettes, uncomplicated: Secondary | ICD-10-CM | POA: Diagnosis not present

## 2019-11-22 DIAGNOSIS — E1169 Type 2 diabetes mellitus with other specified complication: Secondary | ICD-10-CM | POA: Diagnosis not present

## 2019-11-22 DIAGNOSIS — Z79899 Other long term (current) drug therapy: Secondary | ICD-10-CM | POA: Diagnosis not present

## 2019-11-22 DIAGNOSIS — L97412 Non-pressure chronic ulcer of right heel and midfoot with fat layer exposed: Secondary | ICD-10-CM | POA: Diagnosis not present

## 2019-11-22 DIAGNOSIS — E1140 Type 2 diabetes mellitus with diabetic neuropathy, unspecified: Secondary | ICD-10-CM | POA: Diagnosis not present

## 2019-11-22 DIAGNOSIS — Z792 Long term (current) use of antibiotics: Secondary | ICD-10-CM | POA: Diagnosis not present

## 2019-11-22 DIAGNOSIS — Z7984 Long term (current) use of oral hypoglycemic drugs: Secondary | ICD-10-CM | POA: Diagnosis not present

## 2019-11-22 DIAGNOSIS — M868X7 Other osteomyelitis, ankle and foot: Secondary | ICD-10-CM | POA: Diagnosis not present

## 2019-11-22 DIAGNOSIS — E11621 Type 2 diabetes mellitus with foot ulcer: Secondary | ICD-10-CM | POA: Diagnosis not present

## 2019-11-25 DIAGNOSIS — E11621 Type 2 diabetes mellitus with foot ulcer: Secondary | ICD-10-CM | POA: Diagnosis not present

## 2019-11-25 DIAGNOSIS — I10 Essential (primary) hypertension: Secondary | ICD-10-CM | POA: Diagnosis not present

## 2019-11-25 DIAGNOSIS — Z7984 Long term (current) use of oral hypoglycemic drugs: Secondary | ICD-10-CM | POA: Diagnosis not present

## 2019-11-25 DIAGNOSIS — Z792 Long term (current) use of antibiotics: Secondary | ICD-10-CM | POA: Diagnosis not present

## 2019-11-25 DIAGNOSIS — L97519 Non-pressure chronic ulcer of other part of right foot with unspecified severity: Secondary | ICD-10-CM | POA: Diagnosis not present

## 2019-11-25 DIAGNOSIS — I2510 Atherosclerotic heart disease of native coronary artery without angina pectoris: Secondary | ICD-10-CM | POA: Diagnosis not present

## 2019-11-25 DIAGNOSIS — M869 Osteomyelitis, unspecified: Secondary | ICD-10-CM | POA: Diagnosis not present

## 2019-11-25 DIAGNOSIS — L97412 Non-pressure chronic ulcer of right heel and midfoot with fat layer exposed: Secondary | ICD-10-CM | POA: Diagnosis not present

## 2019-11-25 DIAGNOSIS — E1140 Type 2 diabetes mellitus with diabetic neuropathy, unspecified: Secondary | ICD-10-CM | POA: Diagnosis not present

## 2019-11-25 DIAGNOSIS — Z7982 Long term (current) use of aspirin: Secondary | ICD-10-CM | POA: Diagnosis not present

## 2019-11-27 DIAGNOSIS — I10 Essential (primary) hypertension: Secondary | ICD-10-CM | POA: Diagnosis not present

## 2019-11-27 DIAGNOSIS — E7849 Other hyperlipidemia: Secondary | ICD-10-CM | POA: Diagnosis not present

## 2019-11-27 DIAGNOSIS — E1142 Type 2 diabetes mellitus with diabetic polyneuropathy: Secondary | ICD-10-CM | POA: Diagnosis not present

## 2019-12-03 DIAGNOSIS — F17210 Nicotine dependence, cigarettes, uncomplicated: Secondary | ICD-10-CM | POA: Diagnosis not present

## 2019-12-03 DIAGNOSIS — Z792 Long term (current) use of antibiotics: Secondary | ICD-10-CM | POA: Diagnosis not present

## 2019-12-03 DIAGNOSIS — M868X7 Other osteomyelitis, ankle and foot: Secondary | ICD-10-CM | POA: Diagnosis not present

## 2019-12-03 DIAGNOSIS — E1140 Type 2 diabetes mellitus with diabetic neuropathy, unspecified: Secondary | ICD-10-CM | POA: Diagnosis not present

## 2019-12-03 DIAGNOSIS — E11621 Type 2 diabetes mellitus with foot ulcer: Secondary | ICD-10-CM | POA: Diagnosis not present

## 2019-12-03 DIAGNOSIS — I2510 Atherosclerotic heart disease of native coronary artery without angina pectoris: Secondary | ICD-10-CM | POA: Diagnosis not present

## 2019-12-03 DIAGNOSIS — Z7982 Long term (current) use of aspirin: Secondary | ICD-10-CM | POA: Diagnosis not present

## 2019-12-03 DIAGNOSIS — E1169 Type 2 diabetes mellitus with other specified complication: Secondary | ICD-10-CM | POA: Diagnosis not present

## 2019-12-03 DIAGNOSIS — L97412 Non-pressure chronic ulcer of right heel and midfoot with fat layer exposed: Secondary | ICD-10-CM | POA: Diagnosis not present

## 2019-12-03 DIAGNOSIS — Z794 Long term (current) use of insulin: Secondary | ICD-10-CM | POA: Diagnosis not present

## 2019-12-03 DIAGNOSIS — Z9049 Acquired absence of other specified parts of digestive tract: Secondary | ICD-10-CM | POA: Diagnosis not present

## 2019-12-03 DIAGNOSIS — Z79899 Other long term (current) drug therapy: Secondary | ICD-10-CM | POA: Diagnosis not present

## 2019-12-03 DIAGNOSIS — Z7984 Long term (current) use of oral hypoglycemic drugs: Secondary | ICD-10-CM | POA: Diagnosis not present

## 2019-12-03 DIAGNOSIS — I10 Essential (primary) hypertension: Secondary | ICD-10-CM | POA: Diagnosis not present

## 2019-12-10 DIAGNOSIS — F17210 Nicotine dependence, cigarettes, uncomplicated: Secondary | ICD-10-CM | POA: Diagnosis not present

## 2019-12-10 DIAGNOSIS — Z794 Long term (current) use of insulin: Secondary | ICD-10-CM | POA: Diagnosis not present

## 2019-12-10 DIAGNOSIS — B9562 Methicillin resistant Staphylococcus aureus infection as the cause of diseases classified elsewhere: Secondary | ICD-10-CM | POA: Diagnosis not present

## 2019-12-10 DIAGNOSIS — F32A Depression, unspecified: Secondary | ICD-10-CM | POA: Diagnosis not present

## 2019-12-10 DIAGNOSIS — M868X7 Other osteomyelitis, ankle and foot: Secondary | ICD-10-CM | POA: Diagnosis not present

## 2019-12-10 DIAGNOSIS — L03115 Cellulitis of right lower limb: Secondary | ICD-10-CM | POA: Diagnosis not present

## 2019-12-10 DIAGNOSIS — M86171 Other acute osteomyelitis, right ankle and foot: Secondary | ICD-10-CM | POA: Diagnosis not present

## 2019-12-10 DIAGNOSIS — K921 Melena: Secondary | ICD-10-CM | POA: Diagnosis not present

## 2019-12-10 DIAGNOSIS — Z9049 Acquired absence of other specified parts of digestive tract: Secondary | ICD-10-CM | POA: Diagnosis not present

## 2019-12-10 DIAGNOSIS — L97513 Non-pressure chronic ulcer of other part of right foot with necrosis of muscle: Secondary | ICD-10-CM | POA: Diagnosis not present

## 2019-12-10 DIAGNOSIS — L97519 Non-pressure chronic ulcer of other part of right foot with unspecified severity: Secondary | ICD-10-CM | POA: Diagnosis not present

## 2019-12-10 DIAGNOSIS — I10 Essential (primary) hypertension: Secondary | ICD-10-CM | POA: Diagnosis not present

## 2019-12-10 DIAGNOSIS — Z86010 Personal history of colonic polyps: Secondary | ICD-10-CM | POA: Diagnosis not present

## 2019-12-10 DIAGNOSIS — I110 Hypertensive heart disease with heart failure: Secondary | ICD-10-CM | POA: Diagnosis not present

## 2019-12-10 DIAGNOSIS — L97529 Non-pressure chronic ulcer of other part of left foot with unspecified severity: Secondary | ICD-10-CM | POA: Diagnosis not present

## 2019-12-10 DIAGNOSIS — I2510 Atherosclerotic heart disease of native coronary artery without angina pectoris: Secondary | ICD-10-CM | POA: Diagnosis not present

## 2019-12-10 DIAGNOSIS — Z7982 Long term (current) use of aspirin: Secondary | ICD-10-CM | POA: Diagnosis not present

## 2019-12-10 DIAGNOSIS — K922 Gastrointestinal hemorrhage, unspecified: Secondary | ICD-10-CM | POA: Diagnosis not present

## 2019-12-10 DIAGNOSIS — L97412 Non-pressure chronic ulcer of right heel and midfoot with fat layer exposed: Secondary | ICD-10-CM | POA: Diagnosis not present

## 2019-12-10 DIAGNOSIS — E785 Hyperlipidemia, unspecified: Secondary | ICD-10-CM | POA: Diagnosis not present

## 2019-12-10 DIAGNOSIS — E1169 Type 2 diabetes mellitus with other specified complication: Secondary | ICD-10-CM | POA: Diagnosis not present

## 2019-12-10 DIAGNOSIS — Z792 Long term (current) use of antibiotics: Secondary | ICD-10-CM | POA: Diagnosis not present

## 2019-12-10 DIAGNOSIS — Z7984 Long term (current) use of oral hypoglycemic drugs: Secondary | ICD-10-CM | POA: Diagnosis not present

## 2019-12-10 DIAGNOSIS — M869 Osteomyelitis, unspecified: Secondary | ICD-10-CM | POA: Diagnosis not present

## 2019-12-10 DIAGNOSIS — I5022 Chronic systolic (congestive) heart failure: Secondary | ICD-10-CM | POA: Diagnosis not present

## 2019-12-10 DIAGNOSIS — L97419 Non-pressure chronic ulcer of right heel and midfoot with unspecified severity: Secondary | ICD-10-CM | POA: Diagnosis not present

## 2019-12-10 DIAGNOSIS — E11621 Type 2 diabetes mellitus with foot ulcer: Secondary | ICD-10-CM | POA: Diagnosis not present

## 2019-12-10 DIAGNOSIS — A4181 Sepsis due to Enterococcus: Secondary | ICD-10-CM | POA: Diagnosis not present

## 2019-12-10 DIAGNOSIS — Z79899 Other long term (current) drug therapy: Secondary | ICD-10-CM | POA: Diagnosis not present

## 2019-12-10 DIAGNOSIS — E1140 Type 2 diabetes mellitus with diabetic neuropathy, unspecified: Secondary | ICD-10-CM | POA: Diagnosis not present

## 2019-12-16 DIAGNOSIS — A4181 Sepsis due to Enterococcus: Secondary | ICD-10-CM | POA: Diagnosis not present

## 2019-12-17 DIAGNOSIS — A4181 Sepsis due to Enterococcus: Secondary | ICD-10-CM | POA: Diagnosis not present

## 2019-12-17 DIAGNOSIS — E11621 Type 2 diabetes mellitus with foot ulcer: Secondary | ICD-10-CM | POA: Diagnosis not present

## 2019-12-18 DIAGNOSIS — Z794 Long term (current) use of insulin: Secondary | ICD-10-CM | POA: Diagnosis not present

## 2019-12-18 DIAGNOSIS — M86171 Other acute osteomyelitis, right ankle and foot: Secondary | ICD-10-CM | POA: Diagnosis not present

## 2019-12-18 DIAGNOSIS — E785 Hyperlipidemia, unspecified: Secondary | ICD-10-CM | POA: Diagnosis not present

## 2019-12-18 DIAGNOSIS — B9562 Methicillin resistant Staphylococcus aureus infection as the cause of diseases classified elsewhere: Secondary | ICD-10-CM | POA: Diagnosis not present

## 2019-12-18 DIAGNOSIS — I2510 Atherosclerotic heart disease of native coronary artery without angina pectoris: Secondary | ICD-10-CM | POA: Diagnosis not present

## 2019-12-18 DIAGNOSIS — E11621 Type 2 diabetes mellitus with foot ulcer: Secondary | ICD-10-CM | POA: Diagnosis not present

## 2019-12-18 DIAGNOSIS — E1169 Type 2 diabetes mellitus with other specified complication: Secondary | ICD-10-CM | POA: Diagnosis not present

## 2019-12-18 DIAGNOSIS — F17291 Nicotine dependence, other tobacco product, in remission: Secondary | ICD-10-CM | POA: Diagnosis not present

## 2019-12-18 DIAGNOSIS — I5022 Chronic systolic (congestive) heart failure: Secondary | ICD-10-CM | POA: Diagnosis not present

## 2019-12-18 DIAGNOSIS — Z89421 Acquired absence of other right toe(s): Secondary | ICD-10-CM | POA: Diagnosis not present

## 2019-12-18 DIAGNOSIS — A4181 Sepsis due to Enterococcus: Secondary | ICD-10-CM | POA: Diagnosis not present

## 2019-12-18 DIAGNOSIS — Z4801 Encounter for change or removal of surgical wound dressing: Secondary | ICD-10-CM | POA: Diagnosis not present

## 2019-12-18 DIAGNOSIS — F32A Depression, unspecified: Secondary | ICD-10-CM | POA: Diagnosis not present

## 2019-12-18 DIAGNOSIS — E1140 Type 2 diabetes mellitus with diabetic neuropathy, unspecified: Secondary | ICD-10-CM | POA: Diagnosis not present

## 2019-12-18 DIAGNOSIS — S92321D Displaced fracture of second metatarsal bone, right foot, subsequent encounter for fracture with routine healing: Secondary | ICD-10-CM | POA: Diagnosis not present

## 2019-12-18 DIAGNOSIS — B965 Pseudomonas (aeruginosa) (mallei) (pseudomallei) as the cause of diseases classified elsewhere: Secondary | ICD-10-CM | POA: Diagnosis not present

## 2019-12-18 DIAGNOSIS — Z452 Encounter for adjustment and management of vascular access device: Secondary | ICD-10-CM | POA: Diagnosis not present

## 2019-12-18 DIAGNOSIS — L97412 Non-pressure chronic ulcer of right heel and midfoot with fat layer exposed: Secondary | ICD-10-CM | POA: Diagnosis not present

## 2019-12-18 DIAGNOSIS — I110 Hypertensive heart disease with heart failure: Secondary | ICD-10-CM | POA: Diagnosis not present

## 2019-12-18 DIAGNOSIS — Z792 Long term (current) use of antibiotics: Secondary | ICD-10-CM | POA: Diagnosis not present

## 2019-12-18 DIAGNOSIS — Z5181 Encounter for therapeutic drug level monitoring: Secondary | ICD-10-CM | POA: Diagnosis not present

## 2019-12-18 DIAGNOSIS — F17210 Nicotine dependence, cigarettes, uncomplicated: Secondary | ICD-10-CM | POA: Diagnosis not present

## 2019-12-18 DIAGNOSIS — Z7984 Long term (current) use of oral hypoglycemic drugs: Secondary | ICD-10-CM | POA: Diagnosis not present

## 2019-12-19 DIAGNOSIS — A4181 Sepsis due to Enterococcus: Secondary | ICD-10-CM | POA: Diagnosis not present

## 2019-12-20 DIAGNOSIS — E7800 Pure hypercholesterolemia, unspecified: Secondary | ICD-10-CM | POA: Diagnosis not present

## 2019-12-20 DIAGNOSIS — M869 Osteomyelitis, unspecified: Secondary | ICD-10-CM | POA: Diagnosis not present

## 2019-12-20 DIAGNOSIS — E11621 Type 2 diabetes mellitus with foot ulcer: Secondary | ICD-10-CM | POA: Diagnosis not present

## 2019-12-20 DIAGNOSIS — Z9049 Acquired absence of other specified parts of digestive tract: Secondary | ICD-10-CM | POA: Diagnosis not present

## 2019-12-20 DIAGNOSIS — E1140 Type 2 diabetes mellitus with diabetic neuropathy, unspecified: Secondary | ICD-10-CM | POA: Diagnosis not present

## 2019-12-20 DIAGNOSIS — Z792 Long term (current) use of antibiotics: Secondary | ICD-10-CM | POA: Diagnosis not present

## 2019-12-20 DIAGNOSIS — Z7982 Long term (current) use of aspirin: Secondary | ICD-10-CM | POA: Diagnosis not present

## 2019-12-20 DIAGNOSIS — F17210 Nicotine dependence, cigarettes, uncomplicated: Secondary | ICD-10-CM | POA: Diagnosis not present

## 2019-12-20 DIAGNOSIS — E1169 Type 2 diabetes mellitus with other specified complication: Secondary | ICD-10-CM | POA: Diagnosis not present

## 2019-12-20 DIAGNOSIS — I10 Essential (primary) hypertension: Secondary | ICD-10-CM | POA: Diagnosis not present

## 2019-12-20 DIAGNOSIS — A4181 Sepsis due to Enterococcus: Secondary | ICD-10-CM | POA: Diagnosis not present

## 2019-12-20 DIAGNOSIS — Z8614 Personal history of Methicillin resistant Staphylococcus aureus infection: Secondary | ICD-10-CM | POA: Diagnosis not present

## 2019-12-20 DIAGNOSIS — I2510 Atherosclerotic heart disease of native coronary artery without angina pectoris: Secondary | ICD-10-CM | POA: Diagnosis not present

## 2019-12-20 DIAGNOSIS — Z7984 Long term (current) use of oral hypoglycemic drugs: Secondary | ICD-10-CM | POA: Diagnosis not present

## 2019-12-20 DIAGNOSIS — L97412 Non-pressure chronic ulcer of right heel and midfoot with fat layer exposed: Secondary | ICD-10-CM | POA: Diagnosis not present

## 2019-12-20 DIAGNOSIS — Z794 Long term (current) use of insulin: Secondary | ICD-10-CM | POA: Diagnosis not present

## 2019-12-21 DIAGNOSIS — A4181 Sepsis due to Enterococcus: Secondary | ICD-10-CM | POA: Diagnosis not present

## 2019-12-22 DIAGNOSIS — A4181 Sepsis due to Enterococcus: Secondary | ICD-10-CM | POA: Diagnosis not present

## 2019-12-23 DIAGNOSIS — I2510 Atherosclerotic heart disease of native coronary artery without angina pectoris: Secondary | ICD-10-CM | POA: Diagnosis not present

## 2019-12-23 DIAGNOSIS — Z452 Encounter for adjustment and management of vascular access device: Secondary | ICD-10-CM | POA: Diagnosis not present

## 2019-12-23 DIAGNOSIS — M86171 Other acute osteomyelitis, right ankle and foot: Secondary | ICD-10-CM | POA: Diagnosis not present

## 2019-12-23 DIAGNOSIS — Z5181 Encounter for therapeutic drug level monitoring: Secondary | ICD-10-CM | POA: Diagnosis not present

## 2019-12-23 DIAGNOSIS — S92321D Displaced fracture of second metatarsal bone, right foot, subsequent encounter for fracture with routine healing: Secondary | ICD-10-CM | POA: Diagnosis not present

## 2019-12-23 DIAGNOSIS — I110 Hypertensive heart disease with heart failure: Secondary | ICD-10-CM | POA: Diagnosis not present

## 2019-12-23 DIAGNOSIS — E11621 Type 2 diabetes mellitus with foot ulcer: Secondary | ICD-10-CM | POA: Diagnosis not present

## 2019-12-23 DIAGNOSIS — E1169 Type 2 diabetes mellitus with other specified complication: Secondary | ICD-10-CM | POA: Diagnosis not present

## 2019-12-23 DIAGNOSIS — F17291 Nicotine dependence, other tobacco product, in remission: Secondary | ICD-10-CM | POA: Diagnosis not present

## 2019-12-23 DIAGNOSIS — E1140 Type 2 diabetes mellitus with diabetic neuropathy, unspecified: Secondary | ICD-10-CM | POA: Diagnosis not present

## 2019-12-23 DIAGNOSIS — E785 Hyperlipidemia, unspecified: Secondary | ICD-10-CM | POA: Diagnosis not present

## 2019-12-23 DIAGNOSIS — F17210 Nicotine dependence, cigarettes, uncomplicated: Secondary | ICD-10-CM | POA: Diagnosis not present

## 2019-12-23 DIAGNOSIS — A4181 Sepsis due to Enterococcus: Secondary | ICD-10-CM | POA: Diagnosis not present

## 2019-12-23 DIAGNOSIS — B9562 Methicillin resistant Staphylococcus aureus infection as the cause of diseases classified elsewhere: Secondary | ICD-10-CM | POA: Diagnosis not present

## 2019-12-23 DIAGNOSIS — I5022 Chronic systolic (congestive) heart failure: Secondary | ICD-10-CM | POA: Diagnosis not present

## 2019-12-23 DIAGNOSIS — F32A Depression, unspecified: Secondary | ICD-10-CM | POA: Diagnosis not present

## 2019-12-23 DIAGNOSIS — Z4801 Encounter for change or removal of surgical wound dressing: Secondary | ICD-10-CM | POA: Diagnosis not present

## 2019-12-23 DIAGNOSIS — Z89421 Acquired absence of other right toe(s): Secondary | ICD-10-CM | POA: Diagnosis not present

## 2019-12-23 DIAGNOSIS — Z7984 Long term (current) use of oral hypoglycemic drugs: Secondary | ICD-10-CM | POA: Diagnosis not present

## 2019-12-23 DIAGNOSIS — Z794 Long term (current) use of insulin: Secondary | ICD-10-CM | POA: Diagnosis not present

## 2019-12-23 DIAGNOSIS — B965 Pseudomonas (aeruginosa) (mallei) (pseudomallei) as the cause of diseases classified elsewhere: Secondary | ICD-10-CM | POA: Diagnosis not present

## 2019-12-23 DIAGNOSIS — Z792 Long term (current) use of antibiotics: Secondary | ICD-10-CM | POA: Diagnosis not present

## 2019-12-23 DIAGNOSIS — L97412 Non-pressure chronic ulcer of right heel and midfoot with fat layer exposed: Secondary | ICD-10-CM | POA: Diagnosis not present

## 2019-12-24 DIAGNOSIS — A4181 Sepsis due to Enterococcus: Secondary | ICD-10-CM | POA: Diagnosis not present

## 2019-12-24 DIAGNOSIS — T8189XA Other complications of procedures, not elsewhere classified, initial encounter: Secondary | ICD-10-CM | POA: Diagnosis not present

## 2019-12-25 DIAGNOSIS — I110 Hypertensive heart disease with heart failure: Secondary | ICD-10-CM | POA: Diagnosis not present

## 2019-12-25 DIAGNOSIS — F32A Depression, unspecified: Secondary | ICD-10-CM | POA: Diagnosis not present

## 2019-12-25 DIAGNOSIS — Z794 Long term (current) use of insulin: Secondary | ICD-10-CM | POA: Diagnosis not present

## 2019-12-25 DIAGNOSIS — I2510 Atherosclerotic heart disease of native coronary artery without angina pectoris: Secondary | ICD-10-CM | POA: Diagnosis not present

## 2019-12-25 DIAGNOSIS — Z5181 Encounter for therapeutic drug level monitoring: Secondary | ICD-10-CM | POA: Diagnosis not present

## 2019-12-25 DIAGNOSIS — F17291 Nicotine dependence, other tobacco product, in remission: Secondary | ICD-10-CM | POA: Diagnosis not present

## 2019-12-25 DIAGNOSIS — E1169 Type 2 diabetes mellitus with other specified complication: Secondary | ICD-10-CM | POA: Diagnosis not present

## 2019-12-25 DIAGNOSIS — Z792 Long term (current) use of antibiotics: Secondary | ICD-10-CM | POA: Diagnosis not present

## 2019-12-25 DIAGNOSIS — L97412 Non-pressure chronic ulcer of right heel and midfoot with fat layer exposed: Secondary | ICD-10-CM | POA: Diagnosis not present

## 2019-12-25 DIAGNOSIS — Z89421 Acquired absence of other right toe(s): Secondary | ICD-10-CM | POA: Diagnosis not present

## 2019-12-25 DIAGNOSIS — B9562 Methicillin resistant Staphylococcus aureus infection as the cause of diseases classified elsewhere: Secondary | ICD-10-CM | POA: Diagnosis not present

## 2019-12-25 DIAGNOSIS — Z7984 Long term (current) use of oral hypoglycemic drugs: Secondary | ICD-10-CM | POA: Diagnosis not present

## 2019-12-25 DIAGNOSIS — F17210 Nicotine dependence, cigarettes, uncomplicated: Secondary | ICD-10-CM | POA: Diagnosis not present

## 2019-12-25 DIAGNOSIS — E1140 Type 2 diabetes mellitus with diabetic neuropathy, unspecified: Secondary | ICD-10-CM | POA: Diagnosis not present

## 2019-12-25 DIAGNOSIS — Z4801 Encounter for change or removal of surgical wound dressing: Secondary | ICD-10-CM | POA: Diagnosis not present

## 2019-12-25 DIAGNOSIS — S92321D Displaced fracture of second metatarsal bone, right foot, subsequent encounter for fracture with routine healing: Secondary | ICD-10-CM | POA: Diagnosis not present

## 2019-12-25 DIAGNOSIS — Z452 Encounter for adjustment and management of vascular access device: Secondary | ICD-10-CM | POA: Diagnosis not present

## 2019-12-25 DIAGNOSIS — B965 Pseudomonas (aeruginosa) (mallei) (pseudomallei) as the cause of diseases classified elsewhere: Secondary | ICD-10-CM | POA: Diagnosis not present

## 2019-12-25 DIAGNOSIS — I5022 Chronic systolic (congestive) heart failure: Secondary | ICD-10-CM | POA: Diagnosis not present

## 2019-12-25 DIAGNOSIS — E785 Hyperlipidemia, unspecified: Secondary | ICD-10-CM | POA: Diagnosis not present

## 2019-12-25 DIAGNOSIS — A4181 Sepsis due to Enterococcus: Secondary | ICD-10-CM | POA: Diagnosis not present

## 2019-12-25 DIAGNOSIS — M86171 Other acute osteomyelitis, right ankle and foot: Secondary | ICD-10-CM | POA: Diagnosis not present

## 2019-12-25 DIAGNOSIS — E11621 Type 2 diabetes mellitus with foot ulcer: Secondary | ICD-10-CM | POA: Diagnosis not present

## 2019-12-26 DIAGNOSIS — E11621 Type 2 diabetes mellitus with foot ulcer: Secondary | ICD-10-CM | POA: Diagnosis not present

## 2019-12-26 DIAGNOSIS — A4181 Sepsis due to Enterococcus: Secondary | ICD-10-CM | POA: Diagnosis not present

## 2019-12-27 DIAGNOSIS — A4181 Sepsis due to Enterococcus: Secondary | ICD-10-CM | POA: Diagnosis not present

## 2019-12-27 DIAGNOSIS — E11621 Type 2 diabetes mellitus with foot ulcer: Secondary | ICD-10-CM | POA: Diagnosis not present

## 2019-12-27 DIAGNOSIS — L97412 Non-pressure chronic ulcer of right heel and midfoot with fat layer exposed: Secondary | ICD-10-CM | POA: Diagnosis not present

## 2019-12-27 DIAGNOSIS — Z794 Long term (current) use of insulin: Secondary | ICD-10-CM | POA: Diagnosis not present

## 2019-12-28 DIAGNOSIS — A4181 Sepsis due to Enterococcus: Secondary | ICD-10-CM | POA: Diagnosis not present

## 2019-12-28 DIAGNOSIS — E11621 Type 2 diabetes mellitus with foot ulcer: Secondary | ICD-10-CM | POA: Diagnosis not present

## 2019-12-29 DIAGNOSIS — E11621 Type 2 diabetes mellitus with foot ulcer: Secondary | ICD-10-CM | POA: Diagnosis not present

## 2019-12-29 DIAGNOSIS — A4181 Sepsis due to Enterococcus: Secondary | ICD-10-CM | POA: Diagnosis not present

## 2019-12-30 DIAGNOSIS — E1169 Type 2 diabetes mellitus with other specified complication: Secondary | ICD-10-CM | POA: Diagnosis not present

## 2019-12-30 DIAGNOSIS — F17210 Nicotine dependence, cigarettes, uncomplicated: Secondary | ICD-10-CM | POA: Diagnosis not present

## 2019-12-30 DIAGNOSIS — Z794 Long term (current) use of insulin: Secondary | ICD-10-CM | POA: Diagnosis not present

## 2019-12-30 DIAGNOSIS — Z7984 Long term (current) use of oral hypoglycemic drugs: Secondary | ICD-10-CM | POA: Diagnosis not present

## 2019-12-30 DIAGNOSIS — F32A Depression, unspecified: Secondary | ICD-10-CM | POA: Diagnosis not present

## 2019-12-30 DIAGNOSIS — I2510 Atherosclerotic heart disease of native coronary artery without angina pectoris: Secondary | ICD-10-CM | POA: Diagnosis not present

## 2019-12-30 DIAGNOSIS — S92321D Displaced fracture of second metatarsal bone, right foot, subsequent encounter for fracture with routine healing: Secondary | ICD-10-CM | POA: Diagnosis not present

## 2019-12-30 DIAGNOSIS — A4181 Sepsis due to Enterococcus: Secondary | ICD-10-CM | POA: Diagnosis not present

## 2019-12-30 DIAGNOSIS — L97412 Non-pressure chronic ulcer of right heel and midfoot with fat layer exposed: Secondary | ICD-10-CM | POA: Diagnosis not present

## 2019-12-30 DIAGNOSIS — B965 Pseudomonas (aeruginosa) (mallei) (pseudomallei) as the cause of diseases classified elsewhere: Secondary | ICD-10-CM | POA: Diagnosis not present

## 2019-12-30 DIAGNOSIS — E11621 Type 2 diabetes mellitus with foot ulcer: Secondary | ICD-10-CM | POA: Diagnosis not present

## 2019-12-30 DIAGNOSIS — M86171 Other acute osteomyelitis, right ankle and foot: Secondary | ICD-10-CM | POA: Diagnosis not present

## 2019-12-30 DIAGNOSIS — F17291 Nicotine dependence, other tobacco product, in remission: Secondary | ICD-10-CM | POA: Diagnosis not present

## 2019-12-30 DIAGNOSIS — B9562 Methicillin resistant Staphylococcus aureus infection as the cause of diseases classified elsewhere: Secondary | ICD-10-CM | POA: Diagnosis not present

## 2019-12-30 DIAGNOSIS — Z4801 Encounter for change or removal of surgical wound dressing: Secondary | ICD-10-CM | POA: Diagnosis not present

## 2019-12-30 DIAGNOSIS — Z5181 Encounter for therapeutic drug level monitoring: Secondary | ICD-10-CM | POA: Diagnosis not present

## 2019-12-30 DIAGNOSIS — E785 Hyperlipidemia, unspecified: Secondary | ICD-10-CM | POA: Diagnosis not present

## 2019-12-30 DIAGNOSIS — I110 Hypertensive heart disease with heart failure: Secondary | ICD-10-CM | POA: Diagnosis not present

## 2019-12-30 DIAGNOSIS — Z89421 Acquired absence of other right toe(s): Secondary | ICD-10-CM | POA: Diagnosis not present

## 2019-12-30 DIAGNOSIS — E1140 Type 2 diabetes mellitus with diabetic neuropathy, unspecified: Secondary | ICD-10-CM | POA: Diagnosis not present

## 2019-12-30 DIAGNOSIS — I5022 Chronic systolic (congestive) heart failure: Secondary | ICD-10-CM | POA: Diagnosis not present

## 2019-12-30 DIAGNOSIS — Z792 Long term (current) use of antibiotics: Secondary | ICD-10-CM | POA: Diagnosis not present

## 2019-12-30 DIAGNOSIS — Z452 Encounter for adjustment and management of vascular access device: Secondary | ICD-10-CM | POA: Diagnosis not present

## 2019-12-31 DIAGNOSIS — E11621 Type 2 diabetes mellitus with foot ulcer: Secondary | ICD-10-CM | POA: Diagnosis not present

## 2019-12-31 DIAGNOSIS — A4181 Sepsis due to Enterococcus: Secondary | ICD-10-CM | POA: Diagnosis not present

## 2020-01-01 DIAGNOSIS — S92321D Displaced fracture of second metatarsal bone, right foot, subsequent encounter for fracture with routine healing: Secondary | ICD-10-CM | POA: Diagnosis not present

## 2020-01-01 DIAGNOSIS — Z7984 Long term (current) use of oral hypoglycemic drugs: Secondary | ICD-10-CM | POA: Diagnosis not present

## 2020-01-01 DIAGNOSIS — F32A Depression, unspecified: Secondary | ICD-10-CM | POA: Diagnosis not present

## 2020-01-01 DIAGNOSIS — Z452 Encounter for adjustment and management of vascular access device: Secondary | ICD-10-CM | POA: Diagnosis not present

## 2020-01-01 DIAGNOSIS — E1140 Type 2 diabetes mellitus with diabetic neuropathy, unspecified: Secondary | ICD-10-CM | POA: Diagnosis not present

## 2020-01-01 DIAGNOSIS — M86171 Other acute osteomyelitis, right ankle and foot: Secondary | ICD-10-CM | POA: Diagnosis not present

## 2020-01-01 DIAGNOSIS — Z794 Long term (current) use of insulin: Secondary | ICD-10-CM | POA: Diagnosis not present

## 2020-01-01 DIAGNOSIS — E785 Hyperlipidemia, unspecified: Secondary | ICD-10-CM | POA: Diagnosis not present

## 2020-01-01 DIAGNOSIS — Z4801 Encounter for change or removal of surgical wound dressing: Secondary | ICD-10-CM | POA: Diagnosis not present

## 2020-01-01 DIAGNOSIS — F17210 Nicotine dependence, cigarettes, uncomplicated: Secondary | ICD-10-CM | POA: Diagnosis not present

## 2020-01-01 DIAGNOSIS — Z792 Long term (current) use of antibiotics: Secondary | ICD-10-CM | POA: Diagnosis not present

## 2020-01-01 DIAGNOSIS — E1169 Type 2 diabetes mellitus with other specified complication: Secondary | ICD-10-CM | POA: Diagnosis not present

## 2020-01-01 DIAGNOSIS — L97412 Non-pressure chronic ulcer of right heel and midfoot with fat layer exposed: Secondary | ICD-10-CM | POA: Diagnosis not present

## 2020-01-01 DIAGNOSIS — A4181 Sepsis due to Enterococcus: Secondary | ICD-10-CM | POA: Diagnosis not present

## 2020-01-01 DIAGNOSIS — I110 Hypertensive heart disease with heart failure: Secondary | ICD-10-CM | POA: Diagnosis not present

## 2020-01-01 DIAGNOSIS — F17291 Nicotine dependence, other tobacco product, in remission: Secondary | ICD-10-CM | POA: Diagnosis not present

## 2020-01-01 DIAGNOSIS — I2510 Atherosclerotic heart disease of native coronary artery without angina pectoris: Secondary | ICD-10-CM | POA: Diagnosis not present

## 2020-01-01 DIAGNOSIS — B965 Pseudomonas (aeruginosa) (mallei) (pseudomallei) as the cause of diseases classified elsewhere: Secondary | ICD-10-CM | POA: Diagnosis not present

## 2020-01-01 DIAGNOSIS — B9562 Methicillin resistant Staphylococcus aureus infection as the cause of diseases classified elsewhere: Secondary | ICD-10-CM | POA: Diagnosis not present

## 2020-01-01 DIAGNOSIS — Z5181 Encounter for therapeutic drug level monitoring: Secondary | ICD-10-CM | POA: Diagnosis not present

## 2020-01-01 DIAGNOSIS — I5022 Chronic systolic (congestive) heart failure: Secondary | ICD-10-CM | POA: Diagnosis not present

## 2020-01-01 DIAGNOSIS — E11621 Type 2 diabetes mellitus with foot ulcer: Secondary | ICD-10-CM | POA: Diagnosis not present

## 2020-01-01 DIAGNOSIS — Z89421 Acquired absence of other right toe(s): Secondary | ICD-10-CM | POA: Diagnosis not present

## 2020-01-02 DIAGNOSIS — E1142 Type 2 diabetes mellitus with diabetic polyneuropathy: Secondary | ICD-10-CM | POA: Diagnosis not present

## 2020-01-02 DIAGNOSIS — E7849 Other hyperlipidemia: Secondary | ICD-10-CM | POA: Diagnosis not present

## 2020-01-02 DIAGNOSIS — I10 Essential (primary) hypertension: Secondary | ICD-10-CM | POA: Diagnosis not present

## 2020-01-02 DIAGNOSIS — M868X7 Other osteomyelitis, ankle and foot: Secondary | ICD-10-CM | POA: Diagnosis not present

## 2020-01-02 DIAGNOSIS — E11621 Type 2 diabetes mellitus with foot ulcer: Secondary | ICD-10-CM | POA: Diagnosis not present

## 2020-01-02 DIAGNOSIS — Z0000 Encounter for general adult medical examination without abnormal findings: Secondary | ICD-10-CM | POA: Diagnosis not present

## 2020-01-02 DIAGNOSIS — A4181 Sepsis due to Enterococcus: Secondary | ICD-10-CM | POA: Diagnosis not present

## 2020-01-02 DIAGNOSIS — I5022 Chronic systolic (congestive) heart failure: Secondary | ICD-10-CM | POA: Diagnosis not present

## 2020-01-03 DIAGNOSIS — E11621 Type 2 diabetes mellitus with foot ulcer: Secondary | ICD-10-CM | POA: Diagnosis not present

## 2020-01-03 DIAGNOSIS — E7800 Pure hypercholesterolemia, unspecified: Secondary | ICD-10-CM | POA: Diagnosis not present

## 2020-01-03 DIAGNOSIS — L97412 Non-pressure chronic ulcer of right heel and midfoot with fat layer exposed: Secondary | ICD-10-CM | POA: Diagnosis not present

## 2020-01-03 DIAGNOSIS — Z794 Long term (current) use of insulin: Secondary | ICD-10-CM | POA: Diagnosis not present

## 2020-01-03 DIAGNOSIS — Z7982 Long term (current) use of aspirin: Secondary | ICD-10-CM | POA: Diagnosis not present

## 2020-01-03 DIAGNOSIS — E1140 Type 2 diabetes mellitus with diabetic neuropathy, unspecified: Secondary | ICD-10-CM | POA: Diagnosis not present

## 2020-01-03 DIAGNOSIS — I10 Essential (primary) hypertension: Secondary | ICD-10-CM | POA: Diagnosis not present

## 2020-01-03 DIAGNOSIS — I2510 Atherosclerotic heart disease of native coronary artery without angina pectoris: Secondary | ICD-10-CM | POA: Diagnosis not present

## 2020-01-03 DIAGNOSIS — Z792 Long term (current) use of antibiotics: Secondary | ICD-10-CM | POA: Diagnosis not present

## 2020-01-03 DIAGNOSIS — Z9049 Acquired absence of other specified parts of digestive tract: Secondary | ICD-10-CM | POA: Diagnosis not present

## 2020-01-03 DIAGNOSIS — Z7984 Long term (current) use of oral hypoglycemic drugs: Secondary | ICD-10-CM | POA: Diagnosis not present

## 2020-01-03 DIAGNOSIS — Z8614 Personal history of Methicillin resistant Staphylococcus aureus infection: Secondary | ICD-10-CM | POA: Diagnosis not present

## 2020-01-03 DIAGNOSIS — F17210 Nicotine dependence, cigarettes, uncomplicated: Secondary | ICD-10-CM | POA: Diagnosis not present

## 2020-01-03 DIAGNOSIS — A4181 Sepsis due to Enterococcus: Secondary | ICD-10-CM | POA: Diagnosis not present

## 2020-01-03 DIAGNOSIS — M869 Osteomyelitis, unspecified: Secondary | ICD-10-CM | POA: Diagnosis not present

## 2020-01-03 DIAGNOSIS — E1169 Type 2 diabetes mellitus with other specified complication: Secondary | ICD-10-CM | POA: Diagnosis not present

## 2020-01-04 DIAGNOSIS — A4181 Sepsis due to Enterococcus: Secondary | ICD-10-CM | POA: Diagnosis not present

## 2020-01-04 DIAGNOSIS — E11621 Type 2 diabetes mellitus with foot ulcer: Secondary | ICD-10-CM | POA: Diagnosis not present

## 2020-01-05 DIAGNOSIS — A4181 Sepsis due to Enterococcus: Secondary | ICD-10-CM | POA: Diagnosis not present

## 2020-01-05 DIAGNOSIS — E11621 Type 2 diabetes mellitus with foot ulcer: Secondary | ICD-10-CM | POA: Diagnosis not present

## 2020-01-06 DIAGNOSIS — F32A Depression, unspecified: Secondary | ICD-10-CM | POA: Diagnosis not present

## 2020-01-06 DIAGNOSIS — F17210 Nicotine dependence, cigarettes, uncomplicated: Secondary | ICD-10-CM | POA: Diagnosis not present

## 2020-01-06 DIAGNOSIS — Z792 Long term (current) use of antibiotics: Secondary | ICD-10-CM | POA: Diagnosis not present

## 2020-01-06 DIAGNOSIS — E11621 Type 2 diabetes mellitus with foot ulcer: Secondary | ICD-10-CM | POA: Diagnosis not present

## 2020-01-06 DIAGNOSIS — Z7984 Long term (current) use of oral hypoglycemic drugs: Secondary | ICD-10-CM | POA: Diagnosis not present

## 2020-01-06 DIAGNOSIS — L97412 Non-pressure chronic ulcer of right heel and midfoot with fat layer exposed: Secondary | ICD-10-CM | POA: Diagnosis not present

## 2020-01-06 DIAGNOSIS — I2510 Atherosclerotic heart disease of native coronary artery without angina pectoris: Secondary | ICD-10-CM | POA: Diagnosis not present

## 2020-01-06 DIAGNOSIS — B965 Pseudomonas (aeruginosa) (mallei) (pseudomallei) as the cause of diseases classified elsewhere: Secondary | ICD-10-CM | POA: Diagnosis not present

## 2020-01-06 DIAGNOSIS — Z452 Encounter for adjustment and management of vascular access device: Secondary | ICD-10-CM | POA: Diagnosis not present

## 2020-01-06 DIAGNOSIS — I5022 Chronic systolic (congestive) heart failure: Secondary | ICD-10-CM | POA: Diagnosis not present

## 2020-01-06 DIAGNOSIS — E1169 Type 2 diabetes mellitus with other specified complication: Secondary | ICD-10-CM | POA: Diagnosis not present

## 2020-01-06 DIAGNOSIS — S92321D Displaced fracture of second metatarsal bone, right foot, subsequent encounter for fracture with routine healing: Secondary | ICD-10-CM | POA: Diagnosis not present

## 2020-01-06 DIAGNOSIS — A4181 Sepsis due to Enterococcus: Secondary | ICD-10-CM | POA: Diagnosis not present

## 2020-01-06 DIAGNOSIS — Z4801 Encounter for change or removal of surgical wound dressing: Secondary | ICD-10-CM | POA: Diagnosis not present

## 2020-01-06 DIAGNOSIS — Z89421 Acquired absence of other right toe(s): Secondary | ICD-10-CM | POA: Diagnosis not present

## 2020-01-06 DIAGNOSIS — Z794 Long term (current) use of insulin: Secondary | ICD-10-CM | POA: Diagnosis not present

## 2020-01-06 DIAGNOSIS — Z5181 Encounter for therapeutic drug level monitoring: Secondary | ICD-10-CM | POA: Diagnosis not present

## 2020-01-06 DIAGNOSIS — B9562 Methicillin resistant Staphylococcus aureus infection as the cause of diseases classified elsewhere: Secondary | ICD-10-CM | POA: Diagnosis not present

## 2020-01-06 DIAGNOSIS — F17291 Nicotine dependence, other tobacco product, in remission: Secondary | ICD-10-CM | POA: Diagnosis not present

## 2020-01-06 DIAGNOSIS — M86171 Other acute osteomyelitis, right ankle and foot: Secondary | ICD-10-CM | POA: Diagnosis not present

## 2020-01-06 DIAGNOSIS — E1140 Type 2 diabetes mellitus with diabetic neuropathy, unspecified: Secondary | ICD-10-CM | POA: Diagnosis not present

## 2020-01-06 DIAGNOSIS — I110 Hypertensive heart disease with heart failure: Secondary | ICD-10-CM | POA: Diagnosis not present

## 2020-01-06 DIAGNOSIS — E785 Hyperlipidemia, unspecified: Secondary | ICD-10-CM | POA: Diagnosis not present

## 2020-01-07 DIAGNOSIS — E11621 Type 2 diabetes mellitus with foot ulcer: Secondary | ICD-10-CM | POA: Diagnosis not present

## 2020-01-07 DIAGNOSIS — A4181 Sepsis due to Enterococcus: Secondary | ICD-10-CM | POA: Diagnosis not present

## 2020-01-08 DIAGNOSIS — I5022 Chronic systolic (congestive) heart failure: Secondary | ICD-10-CM | POA: Diagnosis not present

## 2020-01-08 DIAGNOSIS — E1169 Type 2 diabetes mellitus with other specified complication: Secondary | ICD-10-CM | POA: Diagnosis not present

## 2020-01-08 DIAGNOSIS — I2510 Atherosclerotic heart disease of native coronary artery without angina pectoris: Secondary | ICD-10-CM | POA: Diagnosis not present

## 2020-01-08 DIAGNOSIS — E11621 Type 2 diabetes mellitus with foot ulcer: Secondary | ICD-10-CM | POA: Diagnosis not present

## 2020-01-08 DIAGNOSIS — A4181 Sepsis due to Enterococcus: Secondary | ICD-10-CM | POA: Diagnosis not present

## 2020-01-08 DIAGNOSIS — L97412 Non-pressure chronic ulcer of right heel and midfoot with fat layer exposed: Secondary | ICD-10-CM | POA: Diagnosis not present

## 2020-01-08 DIAGNOSIS — Z5181 Encounter for therapeutic drug level monitoring: Secondary | ICD-10-CM | POA: Diagnosis not present

## 2020-01-08 DIAGNOSIS — Z452 Encounter for adjustment and management of vascular access device: Secondary | ICD-10-CM | POA: Diagnosis not present

## 2020-01-08 DIAGNOSIS — E785 Hyperlipidemia, unspecified: Secondary | ICD-10-CM | POA: Diagnosis not present

## 2020-01-08 DIAGNOSIS — I110 Hypertensive heart disease with heart failure: Secondary | ICD-10-CM | POA: Diagnosis not present

## 2020-01-08 DIAGNOSIS — B965 Pseudomonas (aeruginosa) (mallei) (pseudomallei) as the cause of diseases classified elsewhere: Secondary | ICD-10-CM | POA: Diagnosis not present

## 2020-01-08 DIAGNOSIS — Z89421 Acquired absence of other right toe(s): Secondary | ICD-10-CM | POA: Diagnosis not present

## 2020-01-08 DIAGNOSIS — B9562 Methicillin resistant Staphylococcus aureus infection as the cause of diseases classified elsewhere: Secondary | ICD-10-CM | POA: Diagnosis not present

## 2020-01-08 DIAGNOSIS — F17210 Nicotine dependence, cigarettes, uncomplicated: Secondary | ICD-10-CM | POA: Diagnosis not present

## 2020-01-08 DIAGNOSIS — F32A Depression, unspecified: Secondary | ICD-10-CM | POA: Diagnosis not present

## 2020-01-08 DIAGNOSIS — Z4801 Encounter for change or removal of surgical wound dressing: Secondary | ICD-10-CM | POA: Diagnosis not present

## 2020-01-08 DIAGNOSIS — Z7984 Long term (current) use of oral hypoglycemic drugs: Secondary | ICD-10-CM | POA: Diagnosis not present

## 2020-01-08 DIAGNOSIS — E1140 Type 2 diabetes mellitus with diabetic neuropathy, unspecified: Secondary | ICD-10-CM | POA: Diagnosis not present

## 2020-01-08 DIAGNOSIS — M86171 Other acute osteomyelitis, right ankle and foot: Secondary | ICD-10-CM | POA: Diagnosis not present

## 2020-01-08 DIAGNOSIS — Z794 Long term (current) use of insulin: Secondary | ICD-10-CM | POA: Diagnosis not present

## 2020-01-08 DIAGNOSIS — F17291 Nicotine dependence, other tobacco product, in remission: Secondary | ICD-10-CM | POA: Diagnosis not present

## 2020-01-08 DIAGNOSIS — Z792 Long term (current) use of antibiotics: Secondary | ICD-10-CM | POA: Diagnosis not present

## 2020-01-08 DIAGNOSIS — S92321D Displaced fracture of second metatarsal bone, right foot, subsequent encounter for fracture with routine healing: Secondary | ICD-10-CM | POA: Diagnosis not present

## 2020-01-09 DIAGNOSIS — E11621 Type 2 diabetes mellitus with foot ulcer: Secondary | ICD-10-CM | POA: Diagnosis not present

## 2020-01-09 DIAGNOSIS — A4181 Sepsis due to Enterococcus: Secondary | ICD-10-CM | POA: Diagnosis not present

## 2020-01-10 DIAGNOSIS — E785 Hyperlipidemia, unspecified: Secondary | ICD-10-CM | POA: Diagnosis not present

## 2020-01-10 DIAGNOSIS — E1169 Type 2 diabetes mellitus with other specified complication: Secondary | ICD-10-CM | POA: Diagnosis not present

## 2020-01-10 DIAGNOSIS — E1140 Type 2 diabetes mellitus with diabetic neuropathy, unspecified: Secondary | ICD-10-CM | POA: Diagnosis not present

## 2020-01-10 DIAGNOSIS — M86171 Other acute osteomyelitis, right ankle and foot: Secondary | ICD-10-CM | POA: Diagnosis not present

## 2020-01-10 DIAGNOSIS — Z89421 Acquired absence of other right toe(s): Secondary | ICD-10-CM | POA: Diagnosis not present

## 2020-01-10 DIAGNOSIS — Z452 Encounter for adjustment and management of vascular access device: Secondary | ICD-10-CM | POA: Diagnosis not present

## 2020-01-10 DIAGNOSIS — F17291 Nicotine dependence, other tobacco product, in remission: Secondary | ICD-10-CM | POA: Diagnosis not present

## 2020-01-10 DIAGNOSIS — I2510 Atherosclerotic heart disease of native coronary artery without angina pectoris: Secondary | ICD-10-CM | POA: Diagnosis not present

## 2020-01-10 DIAGNOSIS — F17210 Nicotine dependence, cigarettes, uncomplicated: Secondary | ICD-10-CM | POA: Diagnosis not present

## 2020-01-10 DIAGNOSIS — Z5181 Encounter for therapeutic drug level monitoring: Secondary | ICD-10-CM | POA: Diagnosis not present

## 2020-01-10 DIAGNOSIS — F32A Depression, unspecified: Secondary | ICD-10-CM | POA: Diagnosis not present

## 2020-01-10 DIAGNOSIS — Z4801 Encounter for change or removal of surgical wound dressing: Secondary | ICD-10-CM | POA: Diagnosis not present

## 2020-01-10 DIAGNOSIS — I110 Hypertensive heart disease with heart failure: Secondary | ICD-10-CM | POA: Diagnosis not present

## 2020-01-10 DIAGNOSIS — Z7984 Long term (current) use of oral hypoglycemic drugs: Secondary | ICD-10-CM | POA: Diagnosis not present

## 2020-01-10 DIAGNOSIS — B965 Pseudomonas (aeruginosa) (mallei) (pseudomallei) as the cause of diseases classified elsewhere: Secondary | ICD-10-CM | POA: Diagnosis not present

## 2020-01-10 DIAGNOSIS — B9562 Methicillin resistant Staphylococcus aureus infection as the cause of diseases classified elsewhere: Secondary | ICD-10-CM | POA: Diagnosis not present

## 2020-01-10 DIAGNOSIS — I5022 Chronic systolic (congestive) heart failure: Secondary | ICD-10-CM | POA: Diagnosis not present

## 2020-01-10 DIAGNOSIS — E11621 Type 2 diabetes mellitus with foot ulcer: Secondary | ICD-10-CM | POA: Diagnosis not present

## 2020-01-10 DIAGNOSIS — A4181 Sepsis due to Enterococcus: Secondary | ICD-10-CM | POA: Diagnosis not present

## 2020-01-10 DIAGNOSIS — S92321D Displaced fracture of second metatarsal bone, right foot, subsequent encounter for fracture with routine healing: Secondary | ICD-10-CM | POA: Diagnosis not present

## 2020-01-10 DIAGNOSIS — Z794 Long term (current) use of insulin: Secondary | ICD-10-CM | POA: Diagnosis not present

## 2020-01-10 DIAGNOSIS — Z792 Long term (current) use of antibiotics: Secondary | ICD-10-CM | POA: Diagnosis not present

## 2020-01-10 DIAGNOSIS — L97412 Non-pressure chronic ulcer of right heel and midfoot with fat layer exposed: Secondary | ICD-10-CM | POA: Diagnosis not present

## 2020-01-11 DIAGNOSIS — A4181 Sepsis due to Enterococcus: Secondary | ICD-10-CM | POA: Diagnosis not present

## 2020-01-11 DIAGNOSIS — E11621 Type 2 diabetes mellitus with foot ulcer: Secondary | ICD-10-CM | POA: Diagnosis not present

## 2020-01-12 DIAGNOSIS — A4181 Sepsis due to Enterococcus: Secondary | ICD-10-CM | POA: Diagnosis not present

## 2020-01-12 DIAGNOSIS — E11621 Type 2 diabetes mellitus with foot ulcer: Secondary | ICD-10-CM | POA: Diagnosis not present

## 2020-01-13 DIAGNOSIS — Z792 Long term (current) use of antibiotics: Secondary | ICD-10-CM | POA: Diagnosis not present

## 2020-01-13 DIAGNOSIS — M86171 Other acute osteomyelitis, right ankle and foot: Secondary | ICD-10-CM | POA: Diagnosis not present

## 2020-01-13 DIAGNOSIS — I110 Hypertensive heart disease with heart failure: Secondary | ICD-10-CM | POA: Diagnosis not present

## 2020-01-13 DIAGNOSIS — I2510 Atherosclerotic heart disease of native coronary artery without angina pectoris: Secondary | ICD-10-CM | POA: Diagnosis not present

## 2020-01-13 DIAGNOSIS — S92321D Displaced fracture of second metatarsal bone, right foot, subsequent encounter for fracture with routine healing: Secondary | ICD-10-CM | POA: Diagnosis not present

## 2020-01-13 DIAGNOSIS — I5022 Chronic systolic (congestive) heart failure: Secondary | ICD-10-CM | POA: Diagnosis not present

## 2020-01-13 DIAGNOSIS — F32A Depression, unspecified: Secondary | ICD-10-CM | POA: Diagnosis not present

## 2020-01-13 DIAGNOSIS — F17210 Nicotine dependence, cigarettes, uncomplicated: Secondary | ICD-10-CM | POA: Diagnosis not present

## 2020-01-13 DIAGNOSIS — E1169 Type 2 diabetes mellitus with other specified complication: Secondary | ICD-10-CM | POA: Diagnosis not present

## 2020-01-13 DIAGNOSIS — Z5181 Encounter for therapeutic drug level monitoring: Secondary | ICD-10-CM | POA: Diagnosis not present

## 2020-01-13 DIAGNOSIS — E785 Hyperlipidemia, unspecified: Secondary | ICD-10-CM | POA: Diagnosis not present

## 2020-01-13 DIAGNOSIS — E1140 Type 2 diabetes mellitus with diabetic neuropathy, unspecified: Secondary | ICD-10-CM | POA: Diagnosis not present

## 2020-01-13 DIAGNOSIS — B965 Pseudomonas (aeruginosa) (mallei) (pseudomallei) as the cause of diseases classified elsewhere: Secondary | ICD-10-CM | POA: Diagnosis not present

## 2020-01-13 DIAGNOSIS — L97412 Non-pressure chronic ulcer of right heel and midfoot with fat layer exposed: Secondary | ICD-10-CM | POA: Diagnosis not present

## 2020-01-13 DIAGNOSIS — Z794 Long term (current) use of insulin: Secondary | ICD-10-CM | POA: Diagnosis not present

## 2020-01-13 DIAGNOSIS — Z4801 Encounter for change or removal of surgical wound dressing: Secondary | ICD-10-CM | POA: Diagnosis not present

## 2020-01-13 DIAGNOSIS — Z89421 Acquired absence of other right toe(s): Secondary | ICD-10-CM | POA: Diagnosis not present

## 2020-01-13 DIAGNOSIS — F17291 Nicotine dependence, other tobacco product, in remission: Secondary | ICD-10-CM | POA: Diagnosis not present

## 2020-01-13 DIAGNOSIS — Z452 Encounter for adjustment and management of vascular access device: Secondary | ICD-10-CM | POA: Diagnosis not present

## 2020-01-13 DIAGNOSIS — A4181 Sepsis due to Enterococcus: Secondary | ICD-10-CM | POA: Diagnosis not present

## 2020-01-13 DIAGNOSIS — Z7984 Long term (current) use of oral hypoglycemic drugs: Secondary | ICD-10-CM | POA: Diagnosis not present

## 2020-01-13 DIAGNOSIS — B9562 Methicillin resistant Staphylococcus aureus infection as the cause of diseases classified elsewhere: Secondary | ICD-10-CM | POA: Diagnosis not present

## 2020-01-13 DIAGNOSIS — E11621 Type 2 diabetes mellitus with foot ulcer: Secondary | ICD-10-CM | POA: Diagnosis not present

## 2020-01-14 DIAGNOSIS — E11621 Type 2 diabetes mellitus with foot ulcer: Secondary | ICD-10-CM | POA: Diagnosis not present

## 2020-01-14 DIAGNOSIS — A4181 Sepsis due to Enterococcus: Secondary | ICD-10-CM | POA: Diagnosis not present

## 2020-01-15 DIAGNOSIS — Z7982 Long term (current) use of aspirin: Secondary | ICD-10-CM | POA: Diagnosis not present

## 2020-01-15 DIAGNOSIS — E11621 Type 2 diabetes mellitus with foot ulcer: Secondary | ICD-10-CM | POA: Diagnosis not present

## 2020-01-15 DIAGNOSIS — L97412 Non-pressure chronic ulcer of right heel and midfoot with fat layer exposed: Secondary | ICD-10-CM | POA: Diagnosis not present

## 2020-01-15 DIAGNOSIS — I10 Essential (primary) hypertension: Secondary | ICD-10-CM | POA: Diagnosis not present

## 2020-01-15 DIAGNOSIS — E1140 Type 2 diabetes mellitus with diabetic neuropathy, unspecified: Secondary | ICD-10-CM | POA: Diagnosis not present

## 2020-01-15 DIAGNOSIS — M869 Osteomyelitis, unspecified: Secondary | ICD-10-CM | POA: Diagnosis not present

## 2020-01-15 DIAGNOSIS — E7800 Pure hypercholesterolemia, unspecified: Secondary | ICD-10-CM | POA: Diagnosis not present

## 2020-01-15 DIAGNOSIS — Z89411 Acquired absence of right great toe: Secondary | ICD-10-CM | POA: Diagnosis not present

## 2020-01-15 DIAGNOSIS — Z79899 Other long term (current) drug therapy: Secondary | ICD-10-CM | POA: Diagnosis not present

## 2020-01-15 DIAGNOSIS — L84 Corns and callosities: Secondary | ICD-10-CM | POA: Diagnosis not present

## 2020-01-15 DIAGNOSIS — Z8614 Personal history of Methicillin resistant Staphylococcus aureus infection: Secondary | ICD-10-CM | POA: Diagnosis not present

## 2020-01-15 DIAGNOSIS — I2510 Atherosclerotic heart disease of native coronary artery without angina pectoris: Secondary | ICD-10-CM | POA: Diagnosis not present

## 2020-01-15 DIAGNOSIS — A4181 Sepsis due to Enterococcus: Secondary | ICD-10-CM | POA: Diagnosis not present

## 2020-01-15 DIAGNOSIS — Z794 Long term (current) use of insulin: Secondary | ICD-10-CM | POA: Diagnosis not present

## 2020-01-16 DIAGNOSIS — E11621 Type 2 diabetes mellitus with foot ulcer: Secondary | ICD-10-CM | POA: Diagnosis not present

## 2020-01-16 DIAGNOSIS — A4181 Sepsis due to Enterococcus: Secondary | ICD-10-CM | POA: Diagnosis not present

## 2020-01-17 DIAGNOSIS — I2510 Atherosclerotic heart disease of native coronary artery without angina pectoris: Secondary | ICD-10-CM | POA: Diagnosis not present

## 2020-01-17 DIAGNOSIS — Z89421 Acquired absence of other right toe(s): Secondary | ICD-10-CM | POA: Diagnosis not present

## 2020-01-17 DIAGNOSIS — Z794 Long term (current) use of insulin: Secondary | ICD-10-CM | POA: Diagnosis not present

## 2020-01-17 DIAGNOSIS — I5022 Chronic systolic (congestive) heart failure: Secondary | ICD-10-CM | POA: Diagnosis not present

## 2020-01-17 DIAGNOSIS — M86171 Other acute osteomyelitis, right ankle and foot: Secondary | ICD-10-CM | POA: Diagnosis not present

## 2020-01-17 DIAGNOSIS — E11621 Type 2 diabetes mellitus with foot ulcer: Secondary | ICD-10-CM | POA: Diagnosis not present

## 2020-01-17 DIAGNOSIS — F17291 Nicotine dependence, other tobacco product, in remission: Secondary | ICD-10-CM | POA: Diagnosis not present

## 2020-01-17 DIAGNOSIS — Z5181 Encounter for therapeutic drug level monitoring: Secondary | ICD-10-CM | POA: Diagnosis not present

## 2020-01-17 DIAGNOSIS — Z452 Encounter for adjustment and management of vascular access device: Secondary | ICD-10-CM | POA: Diagnosis not present

## 2020-01-17 DIAGNOSIS — S92321D Displaced fracture of second metatarsal bone, right foot, subsequent encounter for fracture with routine healing: Secondary | ICD-10-CM | POA: Diagnosis not present

## 2020-01-17 DIAGNOSIS — L97412 Non-pressure chronic ulcer of right heel and midfoot with fat layer exposed: Secondary | ICD-10-CM | POA: Diagnosis not present

## 2020-01-17 DIAGNOSIS — Z792 Long term (current) use of antibiotics: Secondary | ICD-10-CM | POA: Diagnosis not present

## 2020-01-17 DIAGNOSIS — I110 Hypertensive heart disease with heart failure: Secondary | ICD-10-CM | POA: Diagnosis not present

## 2020-01-17 DIAGNOSIS — Z7984 Long term (current) use of oral hypoglycemic drugs: Secondary | ICD-10-CM | POA: Diagnosis not present

## 2020-01-17 DIAGNOSIS — E785 Hyperlipidemia, unspecified: Secondary | ICD-10-CM | POA: Diagnosis not present

## 2020-01-17 DIAGNOSIS — F17210 Nicotine dependence, cigarettes, uncomplicated: Secondary | ICD-10-CM | POA: Diagnosis not present

## 2020-01-17 DIAGNOSIS — E1169 Type 2 diabetes mellitus with other specified complication: Secondary | ICD-10-CM | POA: Diagnosis not present

## 2020-01-17 DIAGNOSIS — E1140 Type 2 diabetes mellitus with diabetic neuropathy, unspecified: Secondary | ICD-10-CM | POA: Diagnosis not present

## 2020-01-17 DIAGNOSIS — F32A Depression, unspecified: Secondary | ICD-10-CM | POA: Diagnosis not present

## 2020-01-17 DIAGNOSIS — B9562 Methicillin resistant Staphylococcus aureus infection as the cause of diseases classified elsewhere: Secondary | ICD-10-CM | POA: Diagnosis not present

## 2020-01-17 DIAGNOSIS — A4181 Sepsis due to Enterococcus: Secondary | ICD-10-CM | POA: Diagnosis not present

## 2020-01-17 DIAGNOSIS — Z4801 Encounter for change or removal of surgical wound dressing: Secondary | ICD-10-CM | POA: Diagnosis not present

## 2020-01-17 DIAGNOSIS — B965 Pseudomonas (aeruginosa) (mallei) (pseudomallei) as the cause of diseases classified elsewhere: Secondary | ICD-10-CM | POA: Diagnosis not present

## 2020-01-18 DIAGNOSIS — E11621 Type 2 diabetes mellitus with foot ulcer: Secondary | ICD-10-CM | POA: Diagnosis not present

## 2020-01-18 DIAGNOSIS — A4181 Sepsis due to Enterococcus: Secondary | ICD-10-CM | POA: Diagnosis not present

## 2020-01-19 DIAGNOSIS — E11621 Type 2 diabetes mellitus with foot ulcer: Secondary | ICD-10-CM | POA: Diagnosis not present

## 2020-01-19 DIAGNOSIS — A4181 Sepsis due to Enterococcus: Secondary | ICD-10-CM | POA: Diagnosis not present

## 2020-01-20 DIAGNOSIS — Z4801 Encounter for change or removal of surgical wound dressing: Secondary | ICD-10-CM | POA: Diagnosis not present

## 2020-01-20 DIAGNOSIS — E1140 Type 2 diabetes mellitus with diabetic neuropathy, unspecified: Secondary | ICD-10-CM | POA: Diagnosis not present

## 2020-01-20 DIAGNOSIS — E1169 Type 2 diabetes mellitus with other specified complication: Secondary | ICD-10-CM | POA: Diagnosis not present

## 2020-01-20 DIAGNOSIS — I5022 Chronic systolic (congestive) heart failure: Secondary | ICD-10-CM | POA: Diagnosis not present

## 2020-01-20 DIAGNOSIS — I110 Hypertensive heart disease with heart failure: Secondary | ICD-10-CM | POA: Diagnosis not present

## 2020-01-20 DIAGNOSIS — I2510 Atherosclerotic heart disease of native coronary artery without angina pectoris: Secondary | ICD-10-CM | POA: Diagnosis not present

## 2020-01-20 DIAGNOSIS — F17291 Nicotine dependence, other tobacco product, in remission: Secondary | ICD-10-CM | POA: Diagnosis not present

## 2020-01-20 DIAGNOSIS — Z794 Long term (current) use of insulin: Secondary | ICD-10-CM | POA: Diagnosis not present

## 2020-01-20 DIAGNOSIS — A4181 Sepsis due to Enterococcus: Secondary | ICD-10-CM | POA: Diagnosis not present

## 2020-01-20 DIAGNOSIS — E11621 Type 2 diabetes mellitus with foot ulcer: Secondary | ICD-10-CM | POA: Diagnosis not present

## 2020-01-20 DIAGNOSIS — Z7984 Long term (current) use of oral hypoglycemic drugs: Secondary | ICD-10-CM | POA: Diagnosis not present

## 2020-01-20 DIAGNOSIS — B9562 Methicillin resistant Staphylococcus aureus infection as the cause of diseases classified elsewhere: Secondary | ICD-10-CM | POA: Diagnosis not present

## 2020-01-20 DIAGNOSIS — Z792 Long term (current) use of antibiotics: Secondary | ICD-10-CM | POA: Diagnosis not present

## 2020-01-20 DIAGNOSIS — F32A Depression, unspecified: Secondary | ICD-10-CM | POA: Diagnosis not present

## 2020-01-20 DIAGNOSIS — Z5181 Encounter for therapeutic drug level monitoring: Secondary | ICD-10-CM | POA: Diagnosis not present

## 2020-01-20 DIAGNOSIS — F17210 Nicotine dependence, cigarettes, uncomplicated: Secondary | ICD-10-CM | POA: Diagnosis not present

## 2020-01-20 DIAGNOSIS — E785 Hyperlipidemia, unspecified: Secondary | ICD-10-CM | POA: Diagnosis not present

## 2020-01-20 DIAGNOSIS — Z89421 Acquired absence of other right toe(s): Secondary | ICD-10-CM | POA: Diagnosis not present

## 2020-01-20 DIAGNOSIS — B965 Pseudomonas (aeruginosa) (mallei) (pseudomallei) as the cause of diseases classified elsewhere: Secondary | ICD-10-CM | POA: Diagnosis not present

## 2020-01-20 DIAGNOSIS — M86171 Other acute osteomyelitis, right ankle and foot: Secondary | ICD-10-CM | POA: Diagnosis not present

## 2020-01-20 DIAGNOSIS — Z452 Encounter for adjustment and management of vascular access device: Secondary | ICD-10-CM | POA: Diagnosis not present

## 2020-01-20 DIAGNOSIS — L97412 Non-pressure chronic ulcer of right heel and midfoot with fat layer exposed: Secondary | ICD-10-CM | POA: Diagnosis not present

## 2020-01-20 DIAGNOSIS — S92321D Displaced fracture of second metatarsal bone, right foot, subsequent encounter for fracture with routine healing: Secondary | ICD-10-CM | POA: Diagnosis not present

## 2020-01-21 DIAGNOSIS — E11621 Type 2 diabetes mellitus with foot ulcer: Secondary | ICD-10-CM | POA: Diagnosis not present

## 2020-01-21 DIAGNOSIS — A4181 Sepsis due to Enterococcus: Secondary | ICD-10-CM | POA: Diagnosis not present

## 2020-01-22 DIAGNOSIS — F32A Depression, unspecified: Secondary | ICD-10-CM | POA: Diagnosis not present

## 2020-01-22 DIAGNOSIS — B9562 Methicillin resistant Staphylococcus aureus infection as the cause of diseases classified elsewhere: Secondary | ICD-10-CM | POA: Diagnosis not present

## 2020-01-22 DIAGNOSIS — F17291 Nicotine dependence, other tobacco product, in remission: Secondary | ICD-10-CM | POA: Diagnosis not present

## 2020-01-22 DIAGNOSIS — Z794 Long term (current) use of insulin: Secondary | ICD-10-CM | POA: Diagnosis not present

## 2020-01-22 DIAGNOSIS — Z4801 Encounter for change or removal of surgical wound dressing: Secondary | ICD-10-CM | POA: Diagnosis not present

## 2020-01-22 DIAGNOSIS — E11621 Type 2 diabetes mellitus with foot ulcer: Secondary | ICD-10-CM | POA: Diagnosis not present

## 2020-01-22 DIAGNOSIS — L97412 Non-pressure chronic ulcer of right heel and midfoot with fat layer exposed: Secondary | ICD-10-CM | POA: Diagnosis not present

## 2020-01-22 DIAGNOSIS — B965 Pseudomonas (aeruginosa) (mallei) (pseudomallei) as the cause of diseases classified elsewhere: Secondary | ICD-10-CM | POA: Diagnosis not present

## 2020-01-22 DIAGNOSIS — E1169 Type 2 diabetes mellitus with other specified complication: Secondary | ICD-10-CM | POA: Diagnosis not present

## 2020-01-22 DIAGNOSIS — Z792 Long term (current) use of antibiotics: Secondary | ICD-10-CM | POA: Diagnosis not present

## 2020-01-22 DIAGNOSIS — S92321D Displaced fracture of second metatarsal bone, right foot, subsequent encounter for fracture with routine healing: Secondary | ICD-10-CM | POA: Diagnosis not present

## 2020-01-22 DIAGNOSIS — Z7984 Long term (current) use of oral hypoglycemic drugs: Secondary | ICD-10-CM | POA: Diagnosis not present

## 2020-01-22 DIAGNOSIS — Z452 Encounter for adjustment and management of vascular access device: Secondary | ICD-10-CM | POA: Diagnosis not present

## 2020-01-22 DIAGNOSIS — I110 Hypertensive heart disease with heart failure: Secondary | ICD-10-CM | POA: Diagnosis not present

## 2020-01-22 DIAGNOSIS — Z5181 Encounter for therapeutic drug level monitoring: Secondary | ICD-10-CM | POA: Diagnosis not present

## 2020-01-22 DIAGNOSIS — A4181 Sepsis due to Enterococcus: Secondary | ICD-10-CM | POA: Diagnosis not present

## 2020-01-22 DIAGNOSIS — Z89421 Acquired absence of other right toe(s): Secondary | ICD-10-CM | POA: Diagnosis not present

## 2020-01-22 DIAGNOSIS — E785 Hyperlipidemia, unspecified: Secondary | ICD-10-CM | POA: Diagnosis not present

## 2020-01-22 DIAGNOSIS — M86171 Other acute osteomyelitis, right ankle and foot: Secondary | ICD-10-CM | POA: Diagnosis not present

## 2020-01-22 DIAGNOSIS — F17210 Nicotine dependence, cigarettes, uncomplicated: Secondary | ICD-10-CM | POA: Diagnosis not present

## 2020-01-22 DIAGNOSIS — I5022 Chronic systolic (congestive) heart failure: Secondary | ICD-10-CM | POA: Diagnosis not present

## 2020-01-22 DIAGNOSIS — I2510 Atherosclerotic heart disease of native coronary artery without angina pectoris: Secondary | ICD-10-CM | POA: Diagnosis not present

## 2020-01-22 DIAGNOSIS — E1140 Type 2 diabetes mellitus with diabetic neuropathy, unspecified: Secondary | ICD-10-CM | POA: Diagnosis not present

## 2020-01-23 DIAGNOSIS — E11621 Type 2 diabetes mellitus with foot ulcer: Secondary | ICD-10-CM | POA: Diagnosis not present

## 2020-01-23 DIAGNOSIS — A4181 Sepsis due to Enterococcus: Secondary | ICD-10-CM | POA: Diagnosis not present

## 2020-01-24 DIAGNOSIS — E1169 Type 2 diabetes mellitus with other specified complication: Secondary | ICD-10-CM | POA: Diagnosis not present

## 2020-01-24 DIAGNOSIS — F32A Depression, unspecified: Secondary | ICD-10-CM | POA: Diagnosis not present

## 2020-01-24 DIAGNOSIS — Z792 Long term (current) use of antibiotics: Secondary | ICD-10-CM | POA: Diagnosis not present

## 2020-01-24 DIAGNOSIS — B9562 Methicillin resistant Staphylococcus aureus infection as the cause of diseases classified elsewhere: Secondary | ICD-10-CM | POA: Diagnosis not present

## 2020-01-24 DIAGNOSIS — Z452 Encounter for adjustment and management of vascular access device: Secondary | ICD-10-CM | POA: Diagnosis not present

## 2020-01-24 DIAGNOSIS — Z794 Long term (current) use of insulin: Secondary | ICD-10-CM | POA: Diagnosis not present

## 2020-01-24 DIAGNOSIS — Z4801 Encounter for change or removal of surgical wound dressing: Secondary | ICD-10-CM | POA: Diagnosis not present

## 2020-01-24 DIAGNOSIS — B965 Pseudomonas (aeruginosa) (mallei) (pseudomallei) as the cause of diseases classified elsewhere: Secondary | ICD-10-CM | POA: Diagnosis not present

## 2020-01-24 DIAGNOSIS — Z7984 Long term (current) use of oral hypoglycemic drugs: Secondary | ICD-10-CM | POA: Diagnosis not present

## 2020-01-24 DIAGNOSIS — E1140 Type 2 diabetes mellitus with diabetic neuropathy, unspecified: Secondary | ICD-10-CM | POA: Diagnosis not present

## 2020-01-24 DIAGNOSIS — I5022 Chronic systolic (congestive) heart failure: Secondary | ICD-10-CM | POA: Diagnosis not present

## 2020-01-24 DIAGNOSIS — Z89421 Acquired absence of other right toe(s): Secondary | ICD-10-CM | POA: Diagnosis not present

## 2020-01-24 DIAGNOSIS — F17291 Nicotine dependence, other tobacco product, in remission: Secondary | ICD-10-CM | POA: Diagnosis not present

## 2020-01-24 DIAGNOSIS — M86171 Other acute osteomyelitis, right ankle and foot: Secondary | ICD-10-CM | POA: Diagnosis not present

## 2020-01-24 DIAGNOSIS — I2510 Atherosclerotic heart disease of native coronary artery without angina pectoris: Secondary | ICD-10-CM | POA: Diagnosis not present

## 2020-01-24 DIAGNOSIS — I110 Hypertensive heart disease with heart failure: Secondary | ICD-10-CM | POA: Diagnosis not present

## 2020-01-24 DIAGNOSIS — E11621 Type 2 diabetes mellitus with foot ulcer: Secondary | ICD-10-CM | POA: Diagnosis not present

## 2020-01-24 DIAGNOSIS — Z5181 Encounter for therapeutic drug level monitoring: Secondary | ICD-10-CM | POA: Diagnosis not present

## 2020-01-24 DIAGNOSIS — A4181 Sepsis due to Enterococcus: Secondary | ICD-10-CM | POA: Diagnosis not present

## 2020-01-24 DIAGNOSIS — F17210 Nicotine dependence, cigarettes, uncomplicated: Secondary | ICD-10-CM | POA: Diagnosis not present

## 2020-01-24 DIAGNOSIS — E785 Hyperlipidemia, unspecified: Secondary | ICD-10-CM | POA: Diagnosis not present

## 2020-01-24 DIAGNOSIS — M869 Osteomyelitis, unspecified: Secondary | ICD-10-CM | POA: Diagnosis not present

## 2020-01-24 DIAGNOSIS — L97412 Non-pressure chronic ulcer of right heel and midfoot with fat layer exposed: Secondary | ICD-10-CM | POA: Diagnosis not present

## 2020-01-24 DIAGNOSIS — S92321D Displaced fracture of second metatarsal bone, right foot, subsequent encounter for fracture with routine healing: Secondary | ICD-10-CM | POA: Diagnosis not present

## 2020-01-25 DIAGNOSIS — A4181 Sepsis due to Enterococcus: Secondary | ICD-10-CM | POA: Diagnosis not present

## 2020-01-25 DIAGNOSIS — E11621 Type 2 diabetes mellitus with foot ulcer: Secondary | ICD-10-CM | POA: Diagnosis not present

## 2020-01-26 DIAGNOSIS — A4181 Sepsis due to Enterococcus: Secondary | ICD-10-CM | POA: Diagnosis not present

## 2020-01-26 DIAGNOSIS — E11621 Type 2 diabetes mellitus with foot ulcer: Secondary | ICD-10-CM | POA: Diagnosis not present

## 2020-01-27 DIAGNOSIS — E1169 Type 2 diabetes mellitus with other specified complication: Secondary | ICD-10-CM | POA: Diagnosis not present

## 2020-01-27 DIAGNOSIS — Z452 Encounter for adjustment and management of vascular access device: Secondary | ICD-10-CM | POA: Diagnosis not present

## 2020-01-27 DIAGNOSIS — Z7984 Long term (current) use of oral hypoglycemic drugs: Secondary | ICD-10-CM | POA: Diagnosis not present

## 2020-01-27 DIAGNOSIS — B965 Pseudomonas (aeruginosa) (mallei) (pseudomallei) as the cause of diseases classified elsewhere: Secondary | ICD-10-CM | POA: Diagnosis not present

## 2020-01-27 DIAGNOSIS — Z89421 Acquired absence of other right toe(s): Secondary | ICD-10-CM | POA: Diagnosis not present

## 2020-01-27 DIAGNOSIS — A4181 Sepsis due to Enterococcus: Secondary | ICD-10-CM | POA: Diagnosis not present

## 2020-01-27 DIAGNOSIS — F32A Depression, unspecified: Secondary | ICD-10-CM | POA: Diagnosis not present

## 2020-01-27 DIAGNOSIS — E1140 Type 2 diabetes mellitus with diabetic neuropathy, unspecified: Secondary | ICD-10-CM | POA: Diagnosis not present

## 2020-01-27 DIAGNOSIS — B9562 Methicillin resistant Staphylococcus aureus infection as the cause of diseases classified elsewhere: Secondary | ICD-10-CM | POA: Diagnosis not present

## 2020-01-27 DIAGNOSIS — Z4801 Encounter for change or removal of surgical wound dressing: Secondary | ICD-10-CM | POA: Diagnosis not present

## 2020-01-27 DIAGNOSIS — L97412 Non-pressure chronic ulcer of right heel and midfoot with fat layer exposed: Secondary | ICD-10-CM | POA: Diagnosis not present

## 2020-01-27 DIAGNOSIS — F17210 Nicotine dependence, cigarettes, uncomplicated: Secondary | ICD-10-CM | POA: Diagnosis not present

## 2020-01-27 DIAGNOSIS — M86171 Other acute osteomyelitis, right ankle and foot: Secondary | ICD-10-CM | POA: Diagnosis not present

## 2020-01-27 DIAGNOSIS — I5022 Chronic systolic (congestive) heart failure: Secondary | ICD-10-CM | POA: Diagnosis not present

## 2020-01-27 DIAGNOSIS — E11621 Type 2 diabetes mellitus with foot ulcer: Secondary | ICD-10-CM | POA: Diagnosis not present

## 2020-01-27 DIAGNOSIS — E785 Hyperlipidemia, unspecified: Secondary | ICD-10-CM | POA: Diagnosis not present

## 2020-01-27 DIAGNOSIS — Z5181 Encounter for therapeutic drug level monitoring: Secondary | ICD-10-CM | POA: Diagnosis not present

## 2020-01-27 DIAGNOSIS — F17291 Nicotine dependence, other tobacco product, in remission: Secondary | ICD-10-CM | POA: Diagnosis not present

## 2020-01-27 DIAGNOSIS — S92321D Displaced fracture of second metatarsal bone, right foot, subsequent encounter for fracture with routine healing: Secondary | ICD-10-CM | POA: Diagnosis not present

## 2020-01-27 DIAGNOSIS — Z794 Long term (current) use of insulin: Secondary | ICD-10-CM | POA: Diagnosis not present

## 2020-01-27 DIAGNOSIS — Z792 Long term (current) use of antibiotics: Secondary | ICD-10-CM | POA: Diagnosis not present

## 2020-01-27 DIAGNOSIS — I2510 Atherosclerotic heart disease of native coronary artery without angina pectoris: Secondary | ICD-10-CM | POA: Diagnosis not present

## 2020-01-27 DIAGNOSIS — I110 Hypertensive heart disease with heart failure: Secondary | ICD-10-CM | POA: Diagnosis not present

## 2020-01-29 DIAGNOSIS — L97412 Non-pressure chronic ulcer of right heel and midfoot with fat layer exposed: Secondary | ICD-10-CM | POA: Diagnosis not present

## 2020-01-29 DIAGNOSIS — Z4801 Encounter for change or removal of surgical wound dressing: Secondary | ICD-10-CM | POA: Diagnosis not present

## 2020-01-29 DIAGNOSIS — Z89421 Acquired absence of other right toe(s): Secondary | ICD-10-CM | POA: Diagnosis not present

## 2020-01-29 DIAGNOSIS — E11621 Type 2 diabetes mellitus with foot ulcer: Secondary | ICD-10-CM | POA: Diagnosis not present

## 2020-01-29 DIAGNOSIS — I110 Hypertensive heart disease with heart failure: Secondary | ICD-10-CM | POA: Diagnosis not present

## 2020-01-29 DIAGNOSIS — Z7984 Long term (current) use of oral hypoglycemic drugs: Secondary | ICD-10-CM | POA: Diagnosis not present

## 2020-01-29 DIAGNOSIS — E1140 Type 2 diabetes mellitus with diabetic neuropathy, unspecified: Secondary | ICD-10-CM | POA: Diagnosis not present

## 2020-01-29 DIAGNOSIS — M86171 Other acute osteomyelitis, right ankle and foot: Secondary | ICD-10-CM | POA: Diagnosis not present

## 2020-01-29 DIAGNOSIS — Z792 Long term (current) use of antibiotics: Secondary | ICD-10-CM | POA: Diagnosis not present

## 2020-01-29 DIAGNOSIS — I2510 Atherosclerotic heart disease of native coronary artery without angina pectoris: Secondary | ICD-10-CM | POA: Diagnosis not present

## 2020-01-29 DIAGNOSIS — I5022 Chronic systolic (congestive) heart failure: Secondary | ICD-10-CM | POA: Diagnosis not present

## 2020-01-29 DIAGNOSIS — F32A Depression, unspecified: Secondary | ICD-10-CM | POA: Diagnosis not present

## 2020-01-29 DIAGNOSIS — F17291 Nicotine dependence, other tobacco product, in remission: Secondary | ICD-10-CM | POA: Diagnosis not present

## 2020-01-29 DIAGNOSIS — E1169 Type 2 diabetes mellitus with other specified complication: Secondary | ICD-10-CM | POA: Diagnosis not present

## 2020-01-29 DIAGNOSIS — Z794 Long term (current) use of insulin: Secondary | ICD-10-CM | POA: Diagnosis not present

## 2020-01-29 DIAGNOSIS — E785 Hyperlipidemia, unspecified: Secondary | ICD-10-CM | POA: Diagnosis not present

## 2020-01-29 DIAGNOSIS — B9562 Methicillin resistant Staphylococcus aureus infection as the cause of diseases classified elsewhere: Secondary | ICD-10-CM | POA: Diagnosis not present

## 2020-01-29 DIAGNOSIS — Z5181 Encounter for therapeutic drug level monitoring: Secondary | ICD-10-CM | POA: Diagnosis not present

## 2020-01-29 DIAGNOSIS — B965 Pseudomonas (aeruginosa) (mallei) (pseudomallei) as the cause of diseases classified elsewhere: Secondary | ICD-10-CM | POA: Diagnosis not present

## 2020-01-29 DIAGNOSIS — Z452 Encounter for adjustment and management of vascular access device: Secondary | ICD-10-CM | POA: Diagnosis not present

## 2020-01-29 DIAGNOSIS — S92321D Displaced fracture of second metatarsal bone, right foot, subsequent encounter for fracture with routine healing: Secondary | ICD-10-CM | POA: Diagnosis not present

## 2020-01-29 DIAGNOSIS — F17210 Nicotine dependence, cigarettes, uncomplicated: Secondary | ICD-10-CM | POA: Diagnosis not present

## 2020-01-31 DIAGNOSIS — Z7982 Long term (current) use of aspirin: Secondary | ICD-10-CM | POA: Diagnosis not present

## 2020-01-31 DIAGNOSIS — Z794 Long term (current) use of insulin: Secondary | ICD-10-CM | POA: Diagnosis not present

## 2020-01-31 DIAGNOSIS — I10 Essential (primary) hypertension: Secondary | ICD-10-CM | POA: Diagnosis not present

## 2020-01-31 DIAGNOSIS — I2510 Atherosclerotic heart disease of native coronary artery without angina pectoris: Secondary | ICD-10-CM | POA: Diagnosis not present

## 2020-01-31 DIAGNOSIS — Z79899 Other long term (current) drug therapy: Secondary | ICD-10-CM | POA: Diagnosis not present

## 2020-01-31 DIAGNOSIS — Z89411 Acquired absence of right great toe: Secondary | ICD-10-CM | POA: Diagnosis not present

## 2020-01-31 DIAGNOSIS — M869 Osteomyelitis, unspecified: Secondary | ICD-10-CM | POA: Diagnosis not present

## 2020-01-31 DIAGNOSIS — E7800 Pure hypercholesterolemia, unspecified: Secondary | ICD-10-CM | POA: Diagnosis not present

## 2020-01-31 DIAGNOSIS — L84 Corns and callosities: Secondary | ICD-10-CM | POA: Diagnosis not present

## 2020-01-31 DIAGNOSIS — L97412 Non-pressure chronic ulcer of right heel and midfoot with fat layer exposed: Secondary | ICD-10-CM | POA: Diagnosis not present

## 2020-01-31 DIAGNOSIS — L97519 Non-pressure chronic ulcer of other part of right foot with unspecified severity: Secondary | ICD-10-CM | POA: Diagnosis not present

## 2020-01-31 DIAGNOSIS — Z8614 Personal history of Methicillin resistant Staphylococcus aureus infection: Secondary | ICD-10-CM | POA: Diagnosis not present

## 2020-01-31 DIAGNOSIS — E11621 Type 2 diabetes mellitus with foot ulcer: Secondary | ICD-10-CM | POA: Diagnosis not present

## 2020-01-31 DIAGNOSIS — E1140 Type 2 diabetes mellitus with diabetic neuropathy, unspecified: Secondary | ICD-10-CM | POA: Diagnosis not present

## 2020-02-01 DIAGNOSIS — E11621 Type 2 diabetes mellitus with foot ulcer: Secondary | ICD-10-CM | POA: Diagnosis not present

## 2020-02-01 DIAGNOSIS — A4181 Sepsis due to Enterococcus: Secondary | ICD-10-CM | POA: Diagnosis not present

## 2020-02-02 DIAGNOSIS — E11621 Type 2 diabetes mellitus with foot ulcer: Secondary | ICD-10-CM | POA: Diagnosis not present

## 2020-02-02 DIAGNOSIS — A4181 Sepsis due to Enterococcus: Secondary | ICD-10-CM | POA: Diagnosis not present

## 2020-02-03 DIAGNOSIS — B965 Pseudomonas (aeruginosa) (mallei) (pseudomallei) as the cause of diseases classified elsewhere: Secondary | ICD-10-CM | POA: Diagnosis not present

## 2020-02-03 DIAGNOSIS — Z792 Long term (current) use of antibiotics: Secondary | ICD-10-CM | POA: Diagnosis not present

## 2020-02-03 DIAGNOSIS — Z5181 Encounter for therapeutic drug level monitoring: Secondary | ICD-10-CM | POA: Diagnosis not present

## 2020-02-03 DIAGNOSIS — Z7984 Long term (current) use of oral hypoglycemic drugs: Secondary | ICD-10-CM | POA: Diagnosis not present

## 2020-02-03 DIAGNOSIS — F32A Depression, unspecified: Secondary | ICD-10-CM | POA: Diagnosis not present

## 2020-02-03 DIAGNOSIS — I5022 Chronic systolic (congestive) heart failure: Secondary | ICD-10-CM | POA: Diagnosis not present

## 2020-02-03 DIAGNOSIS — M86171 Other acute osteomyelitis, right ankle and foot: Secondary | ICD-10-CM | POA: Diagnosis not present

## 2020-02-03 DIAGNOSIS — L97412 Non-pressure chronic ulcer of right heel and midfoot with fat layer exposed: Secondary | ICD-10-CM | POA: Diagnosis not present

## 2020-02-03 DIAGNOSIS — F17291 Nicotine dependence, other tobacco product, in remission: Secondary | ICD-10-CM | POA: Diagnosis not present

## 2020-02-03 DIAGNOSIS — E1169 Type 2 diabetes mellitus with other specified complication: Secondary | ICD-10-CM | POA: Diagnosis not present

## 2020-02-03 DIAGNOSIS — S92321D Displaced fracture of second metatarsal bone, right foot, subsequent encounter for fracture with routine healing: Secondary | ICD-10-CM | POA: Diagnosis not present

## 2020-02-03 DIAGNOSIS — E785 Hyperlipidemia, unspecified: Secondary | ICD-10-CM | POA: Diagnosis not present

## 2020-02-03 DIAGNOSIS — E11621 Type 2 diabetes mellitus with foot ulcer: Secondary | ICD-10-CM | POA: Diagnosis not present

## 2020-02-03 DIAGNOSIS — F17210 Nicotine dependence, cigarettes, uncomplicated: Secondary | ICD-10-CM | POA: Diagnosis not present

## 2020-02-03 DIAGNOSIS — B9562 Methicillin resistant Staphylococcus aureus infection as the cause of diseases classified elsewhere: Secondary | ICD-10-CM | POA: Diagnosis not present

## 2020-02-03 DIAGNOSIS — Z4801 Encounter for change or removal of surgical wound dressing: Secondary | ICD-10-CM | POA: Diagnosis not present

## 2020-02-03 DIAGNOSIS — Z452 Encounter for adjustment and management of vascular access device: Secondary | ICD-10-CM | POA: Diagnosis not present

## 2020-02-03 DIAGNOSIS — Z794 Long term (current) use of insulin: Secondary | ICD-10-CM | POA: Diagnosis not present

## 2020-02-03 DIAGNOSIS — E1140 Type 2 diabetes mellitus with diabetic neuropathy, unspecified: Secondary | ICD-10-CM | POA: Diagnosis not present

## 2020-02-03 DIAGNOSIS — I110 Hypertensive heart disease with heart failure: Secondary | ICD-10-CM | POA: Diagnosis not present

## 2020-02-03 DIAGNOSIS — Z89421 Acquired absence of other right toe(s): Secondary | ICD-10-CM | POA: Diagnosis not present

## 2020-02-03 DIAGNOSIS — I2510 Atherosclerotic heart disease of native coronary artery without angina pectoris: Secondary | ICD-10-CM | POA: Diagnosis not present

## 2020-02-03 DIAGNOSIS — A4181 Sepsis due to Enterococcus: Secondary | ICD-10-CM | POA: Diagnosis not present

## 2020-02-04 DIAGNOSIS — F32A Depression, unspecified: Secondary | ICD-10-CM | POA: Diagnosis not present

## 2020-02-04 DIAGNOSIS — E11621 Type 2 diabetes mellitus with foot ulcer: Secondary | ICD-10-CM | POA: Diagnosis not present

## 2020-02-04 DIAGNOSIS — M86171 Other acute osteomyelitis, right ankle and foot: Secondary | ICD-10-CM | POA: Diagnosis not present

## 2020-02-04 DIAGNOSIS — Z792 Long term (current) use of antibiotics: Secondary | ICD-10-CM | POA: Diagnosis not present

## 2020-02-04 DIAGNOSIS — Z4801 Encounter for change or removal of surgical wound dressing: Secondary | ICD-10-CM | POA: Diagnosis not present

## 2020-02-04 DIAGNOSIS — Z452 Encounter for adjustment and management of vascular access device: Secondary | ICD-10-CM | POA: Diagnosis not present

## 2020-02-04 DIAGNOSIS — F17210 Nicotine dependence, cigarettes, uncomplicated: Secondary | ICD-10-CM | POA: Diagnosis not present

## 2020-02-04 DIAGNOSIS — E1169 Type 2 diabetes mellitus with other specified complication: Secondary | ICD-10-CM | POA: Diagnosis not present

## 2020-02-04 DIAGNOSIS — F17291 Nicotine dependence, other tobacco product, in remission: Secondary | ICD-10-CM | POA: Diagnosis not present

## 2020-02-04 DIAGNOSIS — E785 Hyperlipidemia, unspecified: Secondary | ICD-10-CM | POA: Diagnosis not present

## 2020-02-04 DIAGNOSIS — Z7984 Long term (current) use of oral hypoglycemic drugs: Secondary | ICD-10-CM | POA: Diagnosis not present

## 2020-02-04 DIAGNOSIS — S92321D Displaced fracture of second metatarsal bone, right foot, subsequent encounter for fracture with routine healing: Secondary | ICD-10-CM | POA: Diagnosis not present

## 2020-02-04 DIAGNOSIS — I110 Hypertensive heart disease with heart failure: Secondary | ICD-10-CM | POA: Diagnosis not present

## 2020-02-04 DIAGNOSIS — L97412 Non-pressure chronic ulcer of right heel and midfoot with fat layer exposed: Secondary | ICD-10-CM | POA: Diagnosis not present

## 2020-02-04 DIAGNOSIS — I5022 Chronic systolic (congestive) heart failure: Secondary | ICD-10-CM | POA: Diagnosis not present

## 2020-02-04 DIAGNOSIS — B9562 Methicillin resistant Staphylococcus aureus infection as the cause of diseases classified elsewhere: Secondary | ICD-10-CM | POA: Diagnosis not present

## 2020-02-04 DIAGNOSIS — I2510 Atherosclerotic heart disease of native coronary artery without angina pectoris: Secondary | ICD-10-CM | POA: Diagnosis not present

## 2020-02-04 DIAGNOSIS — Z5181 Encounter for therapeutic drug level monitoring: Secondary | ICD-10-CM | POA: Diagnosis not present

## 2020-02-04 DIAGNOSIS — A4181 Sepsis due to Enterococcus: Secondary | ICD-10-CM | POA: Diagnosis not present

## 2020-02-04 DIAGNOSIS — B965 Pseudomonas (aeruginosa) (mallei) (pseudomallei) as the cause of diseases classified elsewhere: Secondary | ICD-10-CM | POA: Diagnosis not present

## 2020-02-04 DIAGNOSIS — E1140 Type 2 diabetes mellitus with diabetic neuropathy, unspecified: Secondary | ICD-10-CM | POA: Diagnosis not present

## 2020-02-04 DIAGNOSIS — Z794 Long term (current) use of insulin: Secondary | ICD-10-CM | POA: Diagnosis not present

## 2020-02-04 DIAGNOSIS — Z89421 Acquired absence of other right toe(s): Secondary | ICD-10-CM | POA: Diagnosis not present

## 2020-02-05 DIAGNOSIS — A4181 Sepsis due to Enterococcus: Secondary | ICD-10-CM | POA: Diagnosis not present

## 2020-02-05 DIAGNOSIS — E11621 Type 2 diabetes mellitus with foot ulcer: Secondary | ICD-10-CM | POA: Diagnosis not present

## 2020-02-06 DIAGNOSIS — Z89421 Acquired absence of other right toe(s): Secondary | ICD-10-CM | POA: Diagnosis not present

## 2020-02-06 DIAGNOSIS — E1140 Type 2 diabetes mellitus with diabetic neuropathy, unspecified: Secondary | ICD-10-CM | POA: Diagnosis not present

## 2020-02-06 DIAGNOSIS — Z792 Long term (current) use of antibiotics: Secondary | ICD-10-CM | POA: Diagnosis not present

## 2020-02-06 DIAGNOSIS — M86171 Other acute osteomyelitis, right ankle and foot: Secondary | ICD-10-CM | POA: Diagnosis not present

## 2020-02-06 DIAGNOSIS — E11621 Type 2 diabetes mellitus with foot ulcer: Secondary | ICD-10-CM | POA: Diagnosis not present

## 2020-02-06 DIAGNOSIS — S92321D Displaced fracture of second metatarsal bone, right foot, subsequent encounter for fracture with routine healing: Secondary | ICD-10-CM | POA: Diagnosis not present

## 2020-02-06 DIAGNOSIS — B9562 Methicillin resistant Staphylococcus aureus infection as the cause of diseases classified elsewhere: Secondary | ICD-10-CM | POA: Diagnosis not present

## 2020-02-06 DIAGNOSIS — Z452 Encounter for adjustment and management of vascular access device: Secondary | ICD-10-CM | POA: Diagnosis not present

## 2020-02-06 DIAGNOSIS — Z4801 Encounter for change or removal of surgical wound dressing: Secondary | ICD-10-CM | POA: Diagnosis not present

## 2020-02-06 DIAGNOSIS — Z5181 Encounter for therapeutic drug level monitoring: Secondary | ICD-10-CM | POA: Diagnosis not present

## 2020-02-06 DIAGNOSIS — I5022 Chronic systolic (congestive) heart failure: Secondary | ICD-10-CM | POA: Diagnosis not present

## 2020-02-06 DIAGNOSIS — F32A Depression, unspecified: Secondary | ICD-10-CM | POA: Diagnosis not present

## 2020-02-06 DIAGNOSIS — I110 Hypertensive heart disease with heart failure: Secondary | ICD-10-CM | POA: Diagnosis not present

## 2020-02-06 DIAGNOSIS — F17210 Nicotine dependence, cigarettes, uncomplicated: Secondary | ICD-10-CM | POA: Diagnosis not present

## 2020-02-06 DIAGNOSIS — F17291 Nicotine dependence, other tobacco product, in remission: Secondary | ICD-10-CM | POA: Diagnosis not present

## 2020-02-06 DIAGNOSIS — E785 Hyperlipidemia, unspecified: Secondary | ICD-10-CM | POA: Diagnosis not present

## 2020-02-06 DIAGNOSIS — I2510 Atherosclerotic heart disease of native coronary artery without angina pectoris: Secondary | ICD-10-CM | POA: Diagnosis not present

## 2020-02-06 DIAGNOSIS — E1169 Type 2 diabetes mellitus with other specified complication: Secondary | ICD-10-CM | POA: Diagnosis not present

## 2020-02-06 DIAGNOSIS — B965 Pseudomonas (aeruginosa) (mallei) (pseudomallei) as the cause of diseases classified elsewhere: Secondary | ICD-10-CM | POA: Diagnosis not present

## 2020-02-06 DIAGNOSIS — L97412 Non-pressure chronic ulcer of right heel and midfoot with fat layer exposed: Secondary | ICD-10-CM | POA: Diagnosis not present

## 2020-02-06 DIAGNOSIS — Z794 Long term (current) use of insulin: Secondary | ICD-10-CM | POA: Diagnosis not present

## 2020-02-06 DIAGNOSIS — Z7984 Long term (current) use of oral hypoglycemic drugs: Secondary | ICD-10-CM | POA: Diagnosis not present

## 2020-02-07 DIAGNOSIS — E1140 Type 2 diabetes mellitus with diabetic neuropathy, unspecified: Secondary | ICD-10-CM | POA: Diagnosis not present

## 2020-02-07 DIAGNOSIS — Z89411 Acquired absence of right great toe: Secondary | ICD-10-CM | POA: Diagnosis not present

## 2020-02-07 DIAGNOSIS — L97412 Non-pressure chronic ulcer of right heel and midfoot with fat layer exposed: Secondary | ICD-10-CM | POA: Diagnosis not present

## 2020-02-07 DIAGNOSIS — E7800 Pure hypercholesterolemia, unspecified: Secondary | ICD-10-CM | POA: Diagnosis not present

## 2020-02-07 DIAGNOSIS — Z8614 Personal history of Methicillin resistant Staphylococcus aureus infection: Secondary | ICD-10-CM | POA: Diagnosis not present

## 2020-02-07 DIAGNOSIS — Z7982 Long term (current) use of aspirin: Secondary | ICD-10-CM | POA: Diagnosis not present

## 2020-02-07 DIAGNOSIS — L84 Corns and callosities: Secondary | ICD-10-CM | POA: Diagnosis not present

## 2020-02-07 DIAGNOSIS — I10 Essential (primary) hypertension: Secondary | ICD-10-CM | POA: Diagnosis not present

## 2020-02-07 DIAGNOSIS — I2510 Atherosclerotic heart disease of native coronary artery without angina pectoris: Secondary | ICD-10-CM | POA: Diagnosis not present

## 2020-02-07 DIAGNOSIS — Z794 Long term (current) use of insulin: Secondary | ICD-10-CM | POA: Diagnosis not present

## 2020-02-07 DIAGNOSIS — Z79899 Other long term (current) drug therapy: Secondary | ICD-10-CM | POA: Diagnosis not present

## 2020-02-07 DIAGNOSIS — E11621 Type 2 diabetes mellitus with foot ulcer: Secondary | ICD-10-CM | POA: Diagnosis not present

## 2020-02-07 DIAGNOSIS — M869 Osteomyelitis, unspecified: Secondary | ICD-10-CM | POA: Diagnosis not present

## 2020-02-10 DIAGNOSIS — I110 Hypertensive heart disease with heart failure: Secondary | ICD-10-CM | POA: Diagnosis not present

## 2020-02-10 DIAGNOSIS — Z89421 Acquired absence of other right toe(s): Secondary | ICD-10-CM | POA: Diagnosis not present

## 2020-02-10 DIAGNOSIS — F17210 Nicotine dependence, cigarettes, uncomplicated: Secondary | ICD-10-CM | POA: Diagnosis not present

## 2020-02-10 DIAGNOSIS — B9562 Methicillin resistant Staphylococcus aureus infection as the cause of diseases classified elsewhere: Secondary | ICD-10-CM | POA: Diagnosis not present

## 2020-02-10 DIAGNOSIS — L97412 Non-pressure chronic ulcer of right heel and midfoot with fat layer exposed: Secondary | ICD-10-CM | POA: Diagnosis not present

## 2020-02-10 DIAGNOSIS — Z792 Long term (current) use of antibiotics: Secondary | ICD-10-CM | POA: Diagnosis not present

## 2020-02-10 DIAGNOSIS — Z4801 Encounter for change or removal of surgical wound dressing: Secondary | ICD-10-CM | POA: Diagnosis not present

## 2020-02-10 DIAGNOSIS — Z794 Long term (current) use of insulin: Secondary | ICD-10-CM | POA: Diagnosis not present

## 2020-02-10 DIAGNOSIS — I2510 Atherosclerotic heart disease of native coronary artery without angina pectoris: Secondary | ICD-10-CM | POA: Diagnosis not present

## 2020-02-10 DIAGNOSIS — Z7984 Long term (current) use of oral hypoglycemic drugs: Secondary | ICD-10-CM | POA: Diagnosis not present

## 2020-02-10 DIAGNOSIS — Z5181 Encounter for therapeutic drug level monitoring: Secondary | ICD-10-CM | POA: Diagnosis not present

## 2020-02-10 DIAGNOSIS — E1169 Type 2 diabetes mellitus with other specified complication: Secondary | ICD-10-CM | POA: Diagnosis not present

## 2020-02-10 DIAGNOSIS — E1140 Type 2 diabetes mellitus with diabetic neuropathy, unspecified: Secondary | ICD-10-CM | POA: Diagnosis not present

## 2020-02-10 DIAGNOSIS — B965 Pseudomonas (aeruginosa) (mallei) (pseudomallei) as the cause of diseases classified elsewhere: Secondary | ICD-10-CM | POA: Diagnosis not present

## 2020-02-10 DIAGNOSIS — E785 Hyperlipidemia, unspecified: Secondary | ICD-10-CM | POA: Diagnosis not present

## 2020-02-10 DIAGNOSIS — F17291 Nicotine dependence, other tobacco product, in remission: Secondary | ICD-10-CM | POA: Diagnosis not present

## 2020-02-10 DIAGNOSIS — M86171 Other acute osteomyelitis, right ankle and foot: Secondary | ICD-10-CM | POA: Diagnosis not present

## 2020-02-10 DIAGNOSIS — F32A Depression, unspecified: Secondary | ICD-10-CM | POA: Diagnosis not present

## 2020-02-10 DIAGNOSIS — S92321D Displaced fracture of second metatarsal bone, right foot, subsequent encounter for fracture with routine healing: Secondary | ICD-10-CM | POA: Diagnosis not present

## 2020-02-10 DIAGNOSIS — I5022 Chronic systolic (congestive) heart failure: Secondary | ICD-10-CM | POA: Diagnosis not present

## 2020-02-10 DIAGNOSIS — E11621 Type 2 diabetes mellitus with foot ulcer: Secondary | ICD-10-CM | POA: Diagnosis not present

## 2020-02-10 DIAGNOSIS — Z452 Encounter for adjustment and management of vascular access device: Secondary | ICD-10-CM | POA: Diagnosis not present

## 2020-02-12 DIAGNOSIS — E1140 Type 2 diabetes mellitus with diabetic neuropathy, unspecified: Secondary | ICD-10-CM | POA: Diagnosis not present

## 2020-02-12 DIAGNOSIS — Z5181 Encounter for therapeutic drug level monitoring: Secondary | ICD-10-CM | POA: Diagnosis not present

## 2020-02-12 DIAGNOSIS — B9562 Methicillin resistant Staphylococcus aureus infection as the cause of diseases classified elsewhere: Secondary | ICD-10-CM | POA: Diagnosis not present

## 2020-02-12 DIAGNOSIS — E11621 Type 2 diabetes mellitus with foot ulcer: Secondary | ICD-10-CM | POA: Diagnosis not present

## 2020-02-12 DIAGNOSIS — F32A Depression, unspecified: Secondary | ICD-10-CM | POA: Diagnosis not present

## 2020-02-12 DIAGNOSIS — Z794 Long term (current) use of insulin: Secondary | ICD-10-CM | POA: Diagnosis not present

## 2020-02-12 DIAGNOSIS — Z89421 Acquired absence of other right toe(s): Secondary | ICD-10-CM | POA: Diagnosis not present

## 2020-02-12 DIAGNOSIS — B965 Pseudomonas (aeruginosa) (mallei) (pseudomallei) as the cause of diseases classified elsewhere: Secondary | ICD-10-CM | POA: Diagnosis not present

## 2020-02-12 DIAGNOSIS — S92321D Displaced fracture of second metatarsal bone, right foot, subsequent encounter for fracture with routine healing: Secondary | ICD-10-CM | POA: Diagnosis not present

## 2020-02-12 DIAGNOSIS — L97412 Non-pressure chronic ulcer of right heel and midfoot with fat layer exposed: Secondary | ICD-10-CM | POA: Diagnosis not present

## 2020-02-12 DIAGNOSIS — E785 Hyperlipidemia, unspecified: Secondary | ICD-10-CM | POA: Diagnosis not present

## 2020-02-12 DIAGNOSIS — I5022 Chronic systolic (congestive) heart failure: Secondary | ICD-10-CM | POA: Diagnosis not present

## 2020-02-12 DIAGNOSIS — I2510 Atherosclerotic heart disease of native coronary artery without angina pectoris: Secondary | ICD-10-CM | POA: Diagnosis not present

## 2020-02-12 DIAGNOSIS — Z7984 Long term (current) use of oral hypoglycemic drugs: Secondary | ICD-10-CM | POA: Diagnosis not present

## 2020-02-12 DIAGNOSIS — Z4801 Encounter for change or removal of surgical wound dressing: Secondary | ICD-10-CM | POA: Diagnosis not present

## 2020-02-12 DIAGNOSIS — F17291 Nicotine dependence, other tobacco product, in remission: Secondary | ICD-10-CM | POA: Diagnosis not present

## 2020-02-12 DIAGNOSIS — I110 Hypertensive heart disease with heart failure: Secondary | ICD-10-CM | POA: Diagnosis not present

## 2020-02-12 DIAGNOSIS — Z452 Encounter for adjustment and management of vascular access device: Secondary | ICD-10-CM | POA: Diagnosis not present

## 2020-02-12 DIAGNOSIS — E1169 Type 2 diabetes mellitus with other specified complication: Secondary | ICD-10-CM | POA: Diagnosis not present

## 2020-02-12 DIAGNOSIS — F17210 Nicotine dependence, cigarettes, uncomplicated: Secondary | ICD-10-CM | POA: Diagnosis not present

## 2020-02-12 DIAGNOSIS — M86171 Other acute osteomyelitis, right ankle and foot: Secondary | ICD-10-CM | POA: Diagnosis not present

## 2020-02-12 DIAGNOSIS — Z792 Long term (current) use of antibiotics: Secondary | ICD-10-CM | POA: Diagnosis not present

## 2020-02-13 DIAGNOSIS — S92501D Displaced unspecified fracture of right lesser toe(s), subsequent encounter for fracture with routine healing: Secondary | ICD-10-CM | POA: Diagnosis not present

## 2020-02-13 DIAGNOSIS — Z89411 Acquired absence of right great toe: Secondary | ICD-10-CM | POA: Diagnosis not present

## 2020-02-13 DIAGNOSIS — Z872 Personal history of diseases of the skin and subcutaneous tissue: Secondary | ICD-10-CM | POA: Diagnosis not present

## 2020-02-13 DIAGNOSIS — L97412 Non-pressure chronic ulcer of right heel and midfoot with fat layer exposed: Secondary | ICD-10-CM | POA: Diagnosis not present

## 2020-02-13 DIAGNOSIS — E11621 Type 2 diabetes mellitus with foot ulcer: Secondary | ICD-10-CM | POA: Diagnosis not present

## 2020-02-14 DIAGNOSIS — E1140 Type 2 diabetes mellitus with diabetic neuropathy, unspecified: Secondary | ICD-10-CM | POA: Diagnosis not present

## 2020-02-14 DIAGNOSIS — E7800 Pure hypercholesterolemia, unspecified: Secondary | ICD-10-CM | POA: Diagnosis not present

## 2020-02-14 DIAGNOSIS — Z794 Long term (current) use of insulin: Secondary | ICD-10-CM | POA: Diagnosis not present

## 2020-02-14 DIAGNOSIS — Z7982 Long term (current) use of aspirin: Secondary | ICD-10-CM | POA: Diagnosis not present

## 2020-02-14 DIAGNOSIS — L97412 Non-pressure chronic ulcer of right heel and midfoot with fat layer exposed: Secondary | ICD-10-CM | POA: Diagnosis not present

## 2020-02-14 DIAGNOSIS — E11621 Type 2 diabetes mellitus with foot ulcer: Secondary | ICD-10-CM | POA: Diagnosis not present

## 2020-02-14 DIAGNOSIS — Z9049 Acquired absence of other specified parts of digestive tract: Secondary | ICD-10-CM | POA: Diagnosis not present

## 2020-02-14 DIAGNOSIS — I10 Essential (primary) hypertension: Secondary | ICD-10-CM | POA: Diagnosis not present

## 2020-02-14 DIAGNOSIS — Z7984 Long term (current) use of oral hypoglycemic drugs: Secondary | ICD-10-CM | POA: Diagnosis not present

## 2020-02-14 DIAGNOSIS — I2510 Atherosclerotic heart disease of native coronary artery without angina pectoris: Secondary | ICD-10-CM | POA: Diagnosis not present

## 2020-02-14 DIAGNOSIS — Z8614 Personal history of Methicillin resistant Staphylococcus aureus infection: Secondary | ICD-10-CM | POA: Diagnosis not present

## 2020-02-14 DIAGNOSIS — Z79899 Other long term (current) drug therapy: Secondary | ICD-10-CM | POA: Diagnosis not present

## 2020-02-17 DIAGNOSIS — F32A Depression, unspecified: Secondary | ICD-10-CM | POA: Diagnosis not present

## 2020-02-17 DIAGNOSIS — L97412 Non-pressure chronic ulcer of right heel and midfoot with fat layer exposed: Secondary | ICD-10-CM | POA: Diagnosis not present

## 2020-02-17 DIAGNOSIS — B965 Pseudomonas (aeruginosa) (mallei) (pseudomallei) as the cause of diseases classified elsewhere: Secondary | ICD-10-CM | POA: Diagnosis not present

## 2020-02-17 DIAGNOSIS — I2510 Atherosclerotic heart disease of native coronary artery without angina pectoris: Secondary | ICD-10-CM | POA: Diagnosis not present

## 2020-02-17 DIAGNOSIS — B9562 Methicillin resistant Staphylococcus aureus infection as the cause of diseases classified elsewhere: Secondary | ICD-10-CM | POA: Diagnosis not present

## 2020-02-17 DIAGNOSIS — F17210 Nicotine dependence, cigarettes, uncomplicated: Secondary | ICD-10-CM | POA: Diagnosis not present

## 2020-02-17 DIAGNOSIS — F17291 Nicotine dependence, other tobacco product, in remission: Secondary | ICD-10-CM | POA: Diagnosis not present

## 2020-02-17 DIAGNOSIS — Z792 Long term (current) use of antibiotics: Secondary | ICD-10-CM | POA: Diagnosis not present

## 2020-02-17 DIAGNOSIS — S92321D Displaced fracture of second metatarsal bone, right foot, subsequent encounter for fracture with routine healing: Secondary | ICD-10-CM | POA: Diagnosis not present

## 2020-02-17 DIAGNOSIS — I5022 Chronic systolic (congestive) heart failure: Secondary | ICD-10-CM | POA: Diagnosis not present

## 2020-02-17 DIAGNOSIS — E785 Hyperlipidemia, unspecified: Secondary | ICD-10-CM | POA: Diagnosis not present

## 2020-02-17 DIAGNOSIS — M86171 Other acute osteomyelitis, right ankle and foot: Secondary | ICD-10-CM | POA: Diagnosis not present

## 2020-02-17 DIAGNOSIS — Z89421 Acquired absence of other right toe(s): Secondary | ICD-10-CM | POA: Diagnosis not present

## 2020-02-17 DIAGNOSIS — Z5181 Encounter for therapeutic drug level monitoring: Secondary | ICD-10-CM | POA: Diagnosis not present

## 2020-02-17 DIAGNOSIS — Z7984 Long term (current) use of oral hypoglycemic drugs: Secondary | ICD-10-CM | POA: Diagnosis not present

## 2020-02-17 DIAGNOSIS — I110 Hypertensive heart disease with heart failure: Secondary | ICD-10-CM | POA: Diagnosis not present

## 2020-02-17 DIAGNOSIS — Z452 Encounter for adjustment and management of vascular access device: Secondary | ICD-10-CM | POA: Diagnosis not present

## 2020-02-17 DIAGNOSIS — E11621 Type 2 diabetes mellitus with foot ulcer: Secondary | ICD-10-CM | POA: Diagnosis not present

## 2020-02-17 DIAGNOSIS — Z794 Long term (current) use of insulin: Secondary | ICD-10-CM | POA: Diagnosis not present

## 2020-02-17 DIAGNOSIS — E1140 Type 2 diabetes mellitus with diabetic neuropathy, unspecified: Secondary | ICD-10-CM | POA: Diagnosis not present

## 2020-02-17 DIAGNOSIS — Z4801 Encounter for change or removal of surgical wound dressing: Secondary | ICD-10-CM | POA: Diagnosis not present

## 2020-02-17 DIAGNOSIS — E1169 Type 2 diabetes mellitus with other specified complication: Secondary | ICD-10-CM | POA: Diagnosis not present

## 2020-02-18 DIAGNOSIS — I10 Essential (primary) hypertension: Secondary | ICD-10-CM | POA: Diagnosis not present

## 2020-02-18 DIAGNOSIS — E1142 Type 2 diabetes mellitus with diabetic polyneuropathy: Secondary | ICD-10-CM | POA: Diagnosis not present

## 2020-02-18 DIAGNOSIS — I5022 Chronic systolic (congestive) heart failure: Secondary | ICD-10-CM | POA: Diagnosis not present

## 2020-02-18 DIAGNOSIS — M868X7 Other osteomyelitis, ankle and foot: Secondary | ICD-10-CM | POA: Diagnosis not present

## 2020-02-18 DIAGNOSIS — Z0000 Encounter for general adult medical examination without abnormal findings: Secondary | ICD-10-CM | POA: Diagnosis not present

## 2020-02-18 DIAGNOSIS — E7849 Other hyperlipidemia: Secondary | ICD-10-CM | POA: Diagnosis not present

## 2020-02-19 DIAGNOSIS — B9562 Methicillin resistant Staphylococcus aureus infection as the cause of diseases classified elsewhere: Secondary | ICD-10-CM | POA: Diagnosis not present

## 2020-02-19 DIAGNOSIS — F17210 Nicotine dependence, cigarettes, uncomplicated: Secondary | ICD-10-CM | POA: Diagnosis not present

## 2020-02-19 DIAGNOSIS — I5022 Chronic systolic (congestive) heart failure: Secondary | ICD-10-CM | POA: Diagnosis not present

## 2020-02-19 DIAGNOSIS — F32A Depression, unspecified: Secondary | ICD-10-CM | POA: Diagnosis not present

## 2020-02-19 DIAGNOSIS — B965 Pseudomonas (aeruginosa) (mallei) (pseudomallei) as the cause of diseases classified elsewhere: Secondary | ICD-10-CM | POA: Diagnosis not present

## 2020-02-19 DIAGNOSIS — E1169 Type 2 diabetes mellitus with other specified complication: Secondary | ICD-10-CM | POA: Diagnosis not present

## 2020-02-19 DIAGNOSIS — Z794 Long term (current) use of insulin: Secondary | ICD-10-CM | POA: Diagnosis not present

## 2020-02-19 DIAGNOSIS — E785 Hyperlipidemia, unspecified: Secondary | ICD-10-CM | POA: Diagnosis not present

## 2020-02-19 DIAGNOSIS — Z792 Long term (current) use of antibiotics: Secondary | ICD-10-CM | POA: Diagnosis not present

## 2020-02-19 DIAGNOSIS — Z452 Encounter for adjustment and management of vascular access device: Secondary | ICD-10-CM | POA: Diagnosis not present

## 2020-02-19 DIAGNOSIS — Z7984 Long term (current) use of oral hypoglycemic drugs: Secondary | ICD-10-CM | POA: Diagnosis not present

## 2020-02-19 DIAGNOSIS — S92321D Displaced fracture of second metatarsal bone, right foot, subsequent encounter for fracture with routine healing: Secondary | ICD-10-CM | POA: Diagnosis not present

## 2020-02-19 DIAGNOSIS — I2510 Atherosclerotic heart disease of native coronary artery without angina pectoris: Secondary | ICD-10-CM | POA: Diagnosis not present

## 2020-02-19 DIAGNOSIS — F17291 Nicotine dependence, other tobacco product, in remission: Secondary | ICD-10-CM | POA: Diagnosis not present

## 2020-02-19 DIAGNOSIS — Z89421 Acquired absence of other right toe(s): Secondary | ICD-10-CM | POA: Diagnosis not present

## 2020-02-19 DIAGNOSIS — L97412 Non-pressure chronic ulcer of right heel and midfoot with fat layer exposed: Secondary | ICD-10-CM | POA: Diagnosis not present

## 2020-02-19 DIAGNOSIS — Z5181 Encounter for therapeutic drug level monitoring: Secondary | ICD-10-CM | POA: Diagnosis not present

## 2020-02-19 DIAGNOSIS — M86171 Other acute osteomyelitis, right ankle and foot: Secondary | ICD-10-CM | POA: Diagnosis not present

## 2020-02-19 DIAGNOSIS — Z4801 Encounter for change or removal of surgical wound dressing: Secondary | ICD-10-CM | POA: Diagnosis not present

## 2020-02-19 DIAGNOSIS — E1140 Type 2 diabetes mellitus with diabetic neuropathy, unspecified: Secondary | ICD-10-CM | POA: Diagnosis not present

## 2020-02-19 DIAGNOSIS — E11621 Type 2 diabetes mellitus with foot ulcer: Secondary | ICD-10-CM | POA: Diagnosis not present

## 2020-02-19 DIAGNOSIS — I110 Hypertensive heart disease with heart failure: Secondary | ICD-10-CM | POA: Diagnosis not present

## 2020-02-20 DIAGNOSIS — T8189XA Other complications of procedures, not elsewhere classified, initial encounter: Secondary | ICD-10-CM | POA: Diagnosis not present

## 2020-02-21 DIAGNOSIS — I2510 Atherosclerotic heart disease of native coronary artery without angina pectoris: Secondary | ICD-10-CM | POA: Diagnosis not present

## 2020-02-21 DIAGNOSIS — E1140 Type 2 diabetes mellitus with diabetic neuropathy, unspecified: Secondary | ICD-10-CM | POA: Diagnosis not present

## 2020-02-21 DIAGNOSIS — Z7984 Long term (current) use of oral hypoglycemic drugs: Secondary | ICD-10-CM | POA: Diagnosis not present

## 2020-02-21 DIAGNOSIS — I10 Essential (primary) hypertension: Secondary | ICD-10-CM | POA: Diagnosis not present

## 2020-02-21 DIAGNOSIS — M869 Osteomyelitis, unspecified: Secondary | ICD-10-CM | POA: Diagnosis not present

## 2020-02-21 DIAGNOSIS — Z794 Long term (current) use of insulin: Secondary | ICD-10-CM | POA: Diagnosis not present

## 2020-02-21 DIAGNOSIS — L97412 Non-pressure chronic ulcer of right heel and midfoot with fat layer exposed: Secondary | ICD-10-CM | POA: Diagnosis not present

## 2020-02-21 DIAGNOSIS — E11621 Type 2 diabetes mellitus with foot ulcer: Secondary | ICD-10-CM | POA: Diagnosis not present

## 2020-02-21 DIAGNOSIS — Z79899 Other long term (current) drug therapy: Secondary | ICD-10-CM | POA: Diagnosis not present

## 2020-02-21 DIAGNOSIS — E7800 Pure hypercholesterolemia, unspecified: Secondary | ICD-10-CM | POA: Diagnosis not present

## 2020-02-21 DIAGNOSIS — Z9049 Acquired absence of other specified parts of digestive tract: Secondary | ICD-10-CM | POA: Diagnosis not present

## 2020-02-21 DIAGNOSIS — Z8614 Personal history of Methicillin resistant Staphylococcus aureus infection: Secondary | ICD-10-CM | POA: Diagnosis not present

## 2020-02-21 DIAGNOSIS — Z7982 Long term (current) use of aspirin: Secondary | ICD-10-CM | POA: Diagnosis not present

## 2020-02-24 DIAGNOSIS — B965 Pseudomonas (aeruginosa) (mallei) (pseudomallei) as the cause of diseases classified elsewhere: Secondary | ICD-10-CM | POA: Diagnosis not present

## 2020-02-24 DIAGNOSIS — B9562 Methicillin resistant Staphylococcus aureus infection as the cause of diseases classified elsewhere: Secondary | ICD-10-CM | POA: Diagnosis not present

## 2020-02-24 DIAGNOSIS — Z452 Encounter for adjustment and management of vascular access device: Secondary | ICD-10-CM | POA: Diagnosis not present

## 2020-02-24 DIAGNOSIS — E1140 Type 2 diabetes mellitus with diabetic neuropathy, unspecified: Secondary | ICD-10-CM | POA: Diagnosis not present

## 2020-02-24 DIAGNOSIS — Z794 Long term (current) use of insulin: Secondary | ICD-10-CM | POA: Diagnosis not present

## 2020-02-24 DIAGNOSIS — I110 Hypertensive heart disease with heart failure: Secondary | ICD-10-CM | POA: Diagnosis not present

## 2020-02-24 DIAGNOSIS — Z792 Long term (current) use of antibiotics: Secondary | ICD-10-CM | POA: Diagnosis not present

## 2020-02-24 DIAGNOSIS — M86171 Other acute osteomyelitis, right ankle and foot: Secondary | ICD-10-CM | POA: Diagnosis not present

## 2020-02-24 DIAGNOSIS — I2510 Atherosclerotic heart disease of native coronary artery without angina pectoris: Secondary | ICD-10-CM | POA: Diagnosis not present

## 2020-02-24 DIAGNOSIS — F17210 Nicotine dependence, cigarettes, uncomplicated: Secondary | ICD-10-CM | POA: Diagnosis not present

## 2020-02-24 DIAGNOSIS — E11621 Type 2 diabetes mellitus with foot ulcer: Secondary | ICD-10-CM | POA: Diagnosis not present

## 2020-02-24 DIAGNOSIS — F17291 Nicotine dependence, other tobacco product, in remission: Secondary | ICD-10-CM | POA: Diagnosis not present

## 2020-02-24 DIAGNOSIS — E1169 Type 2 diabetes mellitus with other specified complication: Secondary | ICD-10-CM | POA: Diagnosis not present

## 2020-02-24 DIAGNOSIS — L97412 Non-pressure chronic ulcer of right heel and midfoot with fat layer exposed: Secondary | ICD-10-CM | POA: Diagnosis not present

## 2020-02-24 DIAGNOSIS — I5022 Chronic systolic (congestive) heart failure: Secondary | ICD-10-CM | POA: Diagnosis not present

## 2020-02-24 DIAGNOSIS — Z7984 Long term (current) use of oral hypoglycemic drugs: Secondary | ICD-10-CM | POA: Diagnosis not present

## 2020-02-24 DIAGNOSIS — E785 Hyperlipidemia, unspecified: Secondary | ICD-10-CM | POA: Diagnosis not present

## 2020-02-24 DIAGNOSIS — Z89421 Acquired absence of other right toe(s): Secondary | ICD-10-CM | POA: Diagnosis not present

## 2020-02-24 DIAGNOSIS — Z5181 Encounter for therapeutic drug level monitoring: Secondary | ICD-10-CM | POA: Diagnosis not present

## 2020-02-24 DIAGNOSIS — S92321D Displaced fracture of second metatarsal bone, right foot, subsequent encounter for fracture with routine healing: Secondary | ICD-10-CM | POA: Diagnosis not present

## 2020-02-24 DIAGNOSIS — F32A Depression, unspecified: Secondary | ICD-10-CM | POA: Diagnosis not present

## 2020-02-24 DIAGNOSIS — Z4801 Encounter for change or removal of surgical wound dressing: Secondary | ICD-10-CM | POA: Diagnosis not present

## 2020-02-26 DIAGNOSIS — Z794 Long term (current) use of insulin: Secondary | ICD-10-CM | POA: Diagnosis not present

## 2020-02-26 DIAGNOSIS — I5022 Chronic systolic (congestive) heart failure: Secondary | ICD-10-CM | POA: Diagnosis not present

## 2020-02-26 DIAGNOSIS — Z4801 Encounter for change or removal of surgical wound dressing: Secondary | ICD-10-CM | POA: Diagnosis not present

## 2020-02-26 DIAGNOSIS — E1169 Type 2 diabetes mellitus with other specified complication: Secondary | ICD-10-CM | POA: Diagnosis not present

## 2020-02-26 DIAGNOSIS — Z5181 Encounter for therapeutic drug level monitoring: Secondary | ICD-10-CM | POA: Diagnosis not present

## 2020-02-26 DIAGNOSIS — M86171 Other acute osteomyelitis, right ankle and foot: Secondary | ICD-10-CM | POA: Diagnosis not present

## 2020-02-26 DIAGNOSIS — F17291 Nicotine dependence, other tobacco product, in remission: Secondary | ICD-10-CM | POA: Diagnosis not present

## 2020-02-26 DIAGNOSIS — E1140 Type 2 diabetes mellitus with diabetic neuropathy, unspecified: Secondary | ICD-10-CM | POA: Diagnosis not present

## 2020-02-26 DIAGNOSIS — E11621 Type 2 diabetes mellitus with foot ulcer: Secondary | ICD-10-CM | POA: Diagnosis not present

## 2020-02-26 DIAGNOSIS — Z7984 Long term (current) use of oral hypoglycemic drugs: Secondary | ICD-10-CM | POA: Diagnosis not present

## 2020-02-26 DIAGNOSIS — Z792 Long term (current) use of antibiotics: Secondary | ICD-10-CM | POA: Diagnosis not present

## 2020-02-26 DIAGNOSIS — B9562 Methicillin resistant Staphylococcus aureus infection as the cause of diseases classified elsewhere: Secondary | ICD-10-CM | POA: Diagnosis not present

## 2020-02-26 DIAGNOSIS — E785 Hyperlipidemia, unspecified: Secondary | ICD-10-CM | POA: Diagnosis not present

## 2020-02-26 DIAGNOSIS — F32A Depression, unspecified: Secondary | ICD-10-CM | POA: Diagnosis not present

## 2020-02-26 DIAGNOSIS — Z89421 Acquired absence of other right toe(s): Secondary | ICD-10-CM | POA: Diagnosis not present

## 2020-02-26 DIAGNOSIS — F17210 Nicotine dependence, cigarettes, uncomplicated: Secondary | ICD-10-CM | POA: Diagnosis not present

## 2020-02-26 DIAGNOSIS — S92321D Displaced fracture of second metatarsal bone, right foot, subsequent encounter for fracture with routine healing: Secondary | ICD-10-CM | POA: Diagnosis not present

## 2020-02-26 DIAGNOSIS — I2510 Atherosclerotic heart disease of native coronary artery without angina pectoris: Secondary | ICD-10-CM | POA: Diagnosis not present

## 2020-02-26 DIAGNOSIS — I110 Hypertensive heart disease with heart failure: Secondary | ICD-10-CM | POA: Diagnosis not present

## 2020-02-26 DIAGNOSIS — B965 Pseudomonas (aeruginosa) (mallei) (pseudomallei) as the cause of diseases classified elsewhere: Secondary | ICD-10-CM | POA: Diagnosis not present

## 2020-02-26 DIAGNOSIS — Z452 Encounter for adjustment and management of vascular access device: Secondary | ICD-10-CM | POA: Diagnosis not present

## 2020-02-26 DIAGNOSIS — L97412 Non-pressure chronic ulcer of right heel and midfoot with fat layer exposed: Secondary | ICD-10-CM | POA: Diagnosis not present

## 2020-02-28 DIAGNOSIS — Z7982 Long term (current) use of aspirin: Secondary | ICD-10-CM | POA: Diagnosis not present

## 2020-02-28 DIAGNOSIS — Z794 Long term (current) use of insulin: Secondary | ICD-10-CM | POA: Diagnosis not present

## 2020-02-28 DIAGNOSIS — E7800 Pure hypercholesterolemia, unspecified: Secondary | ICD-10-CM | POA: Diagnosis not present

## 2020-02-28 DIAGNOSIS — I2510 Atherosclerotic heart disease of native coronary artery without angina pectoris: Secondary | ICD-10-CM | POA: Diagnosis not present

## 2020-02-28 DIAGNOSIS — L97412 Non-pressure chronic ulcer of right heel and midfoot with fat layer exposed: Secondary | ICD-10-CM | POA: Diagnosis not present

## 2020-02-28 DIAGNOSIS — E1140 Type 2 diabetes mellitus with diabetic neuropathy, unspecified: Secondary | ICD-10-CM | POA: Diagnosis not present

## 2020-02-28 DIAGNOSIS — Z79899 Other long term (current) drug therapy: Secondary | ICD-10-CM | POA: Diagnosis not present

## 2020-02-28 DIAGNOSIS — Z8614 Personal history of Methicillin resistant Staphylococcus aureus infection: Secondary | ICD-10-CM | POA: Diagnosis not present

## 2020-02-28 DIAGNOSIS — E11621 Type 2 diabetes mellitus with foot ulcer: Secondary | ICD-10-CM | POA: Diagnosis not present

## 2020-02-28 DIAGNOSIS — I10 Essential (primary) hypertension: Secondary | ICD-10-CM | POA: Diagnosis not present

## 2020-02-28 DIAGNOSIS — Z7984 Long term (current) use of oral hypoglycemic drugs: Secondary | ICD-10-CM | POA: Diagnosis not present

## 2020-02-28 DIAGNOSIS — Z9049 Acquired absence of other specified parts of digestive tract: Secondary | ICD-10-CM | POA: Diagnosis not present

## 2020-03-02 DIAGNOSIS — Z792 Long term (current) use of antibiotics: Secondary | ICD-10-CM | POA: Diagnosis not present

## 2020-03-02 DIAGNOSIS — E1169 Type 2 diabetes mellitus with other specified complication: Secondary | ICD-10-CM | POA: Diagnosis not present

## 2020-03-02 DIAGNOSIS — I110 Hypertensive heart disease with heart failure: Secondary | ICD-10-CM | POA: Diagnosis not present

## 2020-03-02 DIAGNOSIS — F17210 Nicotine dependence, cigarettes, uncomplicated: Secondary | ICD-10-CM | POA: Diagnosis not present

## 2020-03-02 DIAGNOSIS — E785 Hyperlipidemia, unspecified: Secondary | ICD-10-CM | POA: Diagnosis not present

## 2020-03-02 DIAGNOSIS — Z89421 Acquired absence of other right toe(s): Secondary | ICD-10-CM | POA: Diagnosis not present

## 2020-03-02 DIAGNOSIS — Z452 Encounter for adjustment and management of vascular access device: Secondary | ICD-10-CM | POA: Diagnosis not present

## 2020-03-02 DIAGNOSIS — Z5181 Encounter for therapeutic drug level monitoring: Secondary | ICD-10-CM | POA: Diagnosis not present

## 2020-03-02 DIAGNOSIS — Z4801 Encounter for change or removal of surgical wound dressing: Secondary | ICD-10-CM | POA: Diagnosis not present

## 2020-03-02 DIAGNOSIS — F32A Depression, unspecified: Secondary | ICD-10-CM | POA: Diagnosis not present

## 2020-03-02 DIAGNOSIS — Z7984 Long term (current) use of oral hypoglycemic drugs: Secondary | ICD-10-CM | POA: Diagnosis not present

## 2020-03-02 DIAGNOSIS — S92321D Displaced fracture of second metatarsal bone, right foot, subsequent encounter for fracture with routine healing: Secondary | ICD-10-CM | POA: Diagnosis not present

## 2020-03-02 DIAGNOSIS — I2510 Atherosclerotic heart disease of native coronary artery without angina pectoris: Secondary | ICD-10-CM | POA: Diagnosis not present

## 2020-03-02 DIAGNOSIS — E11621 Type 2 diabetes mellitus with foot ulcer: Secondary | ICD-10-CM | POA: Diagnosis not present

## 2020-03-02 DIAGNOSIS — I5022 Chronic systolic (congestive) heart failure: Secondary | ICD-10-CM | POA: Diagnosis not present

## 2020-03-02 DIAGNOSIS — B965 Pseudomonas (aeruginosa) (mallei) (pseudomallei) as the cause of diseases classified elsewhere: Secondary | ICD-10-CM | POA: Diagnosis not present

## 2020-03-02 DIAGNOSIS — B9562 Methicillin resistant Staphylococcus aureus infection as the cause of diseases classified elsewhere: Secondary | ICD-10-CM | POA: Diagnosis not present

## 2020-03-02 DIAGNOSIS — M86171 Other acute osteomyelitis, right ankle and foot: Secondary | ICD-10-CM | POA: Diagnosis not present

## 2020-03-02 DIAGNOSIS — F17291 Nicotine dependence, other tobacco product, in remission: Secondary | ICD-10-CM | POA: Diagnosis not present

## 2020-03-02 DIAGNOSIS — E1140 Type 2 diabetes mellitus with diabetic neuropathy, unspecified: Secondary | ICD-10-CM | POA: Diagnosis not present

## 2020-03-02 DIAGNOSIS — L97412 Non-pressure chronic ulcer of right heel and midfoot with fat layer exposed: Secondary | ICD-10-CM | POA: Diagnosis not present

## 2020-03-02 DIAGNOSIS — Z794 Long term (current) use of insulin: Secondary | ICD-10-CM | POA: Diagnosis not present

## 2020-03-04 DIAGNOSIS — E11621 Type 2 diabetes mellitus with foot ulcer: Secondary | ICD-10-CM | POA: Diagnosis not present

## 2020-03-04 DIAGNOSIS — F32A Depression, unspecified: Secondary | ICD-10-CM | POA: Diagnosis not present

## 2020-03-04 DIAGNOSIS — E1169 Type 2 diabetes mellitus with other specified complication: Secondary | ICD-10-CM | POA: Diagnosis not present

## 2020-03-04 DIAGNOSIS — M86171 Other acute osteomyelitis, right ankle and foot: Secondary | ICD-10-CM | POA: Diagnosis not present

## 2020-03-04 DIAGNOSIS — E1140 Type 2 diabetes mellitus with diabetic neuropathy, unspecified: Secondary | ICD-10-CM | POA: Diagnosis not present

## 2020-03-04 DIAGNOSIS — F17210 Nicotine dependence, cigarettes, uncomplicated: Secondary | ICD-10-CM | POA: Diagnosis not present

## 2020-03-04 DIAGNOSIS — I5022 Chronic systolic (congestive) heart failure: Secondary | ICD-10-CM | POA: Diagnosis not present

## 2020-03-04 DIAGNOSIS — Z7984 Long term (current) use of oral hypoglycemic drugs: Secondary | ICD-10-CM | POA: Diagnosis not present

## 2020-03-04 DIAGNOSIS — S92321D Displaced fracture of second metatarsal bone, right foot, subsequent encounter for fracture with routine healing: Secondary | ICD-10-CM | POA: Diagnosis not present

## 2020-03-04 DIAGNOSIS — Z792 Long term (current) use of antibiotics: Secondary | ICD-10-CM | POA: Diagnosis not present

## 2020-03-04 DIAGNOSIS — L97412 Non-pressure chronic ulcer of right heel and midfoot with fat layer exposed: Secondary | ICD-10-CM | POA: Diagnosis not present

## 2020-03-04 DIAGNOSIS — I110 Hypertensive heart disease with heart failure: Secondary | ICD-10-CM | POA: Diagnosis not present

## 2020-03-04 DIAGNOSIS — I2510 Atherosclerotic heart disease of native coronary artery without angina pectoris: Secondary | ICD-10-CM | POA: Diagnosis not present

## 2020-03-04 DIAGNOSIS — Z5181 Encounter for therapeutic drug level monitoring: Secondary | ICD-10-CM | POA: Diagnosis not present

## 2020-03-04 DIAGNOSIS — Z89421 Acquired absence of other right toe(s): Secondary | ICD-10-CM | POA: Diagnosis not present

## 2020-03-04 DIAGNOSIS — Z452 Encounter for adjustment and management of vascular access device: Secondary | ICD-10-CM | POA: Diagnosis not present

## 2020-03-04 DIAGNOSIS — Z794 Long term (current) use of insulin: Secondary | ICD-10-CM | POA: Diagnosis not present

## 2020-03-04 DIAGNOSIS — B965 Pseudomonas (aeruginosa) (mallei) (pseudomallei) as the cause of diseases classified elsewhere: Secondary | ICD-10-CM | POA: Diagnosis not present

## 2020-03-04 DIAGNOSIS — E785 Hyperlipidemia, unspecified: Secondary | ICD-10-CM | POA: Diagnosis not present

## 2020-03-04 DIAGNOSIS — B9562 Methicillin resistant Staphylococcus aureus infection as the cause of diseases classified elsewhere: Secondary | ICD-10-CM | POA: Diagnosis not present

## 2020-03-04 DIAGNOSIS — Z4801 Encounter for change or removal of surgical wound dressing: Secondary | ICD-10-CM | POA: Diagnosis not present

## 2020-03-04 DIAGNOSIS — F17291 Nicotine dependence, other tobacco product, in remission: Secondary | ICD-10-CM | POA: Diagnosis not present

## 2020-03-06 DIAGNOSIS — Z7984 Long term (current) use of oral hypoglycemic drugs: Secondary | ICD-10-CM | POA: Diagnosis not present

## 2020-03-06 DIAGNOSIS — Z7982 Long term (current) use of aspirin: Secondary | ICD-10-CM | POA: Diagnosis not present

## 2020-03-06 DIAGNOSIS — Z9049 Acquired absence of other specified parts of digestive tract: Secondary | ICD-10-CM | POA: Diagnosis not present

## 2020-03-06 DIAGNOSIS — Z794 Long term (current) use of insulin: Secondary | ICD-10-CM | POA: Diagnosis not present

## 2020-03-06 DIAGNOSIS — I2510 Atherosclerotic heart disease of native coronary artery without angina pectoris: Secondary | ICD-10-CM | POA: Diagnosis not present

## 2020-03-06 DIAGNOSIS — E7800 Pure hypercholesterolemia, unspecified: Secondary | ICD-10-CM | POA: Diagnosis not present

## 2020-03-06 DIAGNOSIS — L97412 Non-pressure chronic ulcer of right heel and midfoot with fat layer exposed: Secondary | ICD-10-CM | POA: Diagnosis not present

## 2020-03-06 DIAGNOSIS — Z79899 Other long term (current) drug therapy: Secondary | ICD-10-CM | POA: Diagnosis not present

## 2020-03-06 DIAGNOSIS — E1140 Type 2 diabetes mellitus with diabetic neuropathy, unspecified: Secondary | ICD-10-CM | POA: Diagnosis not present

## 2020-03-06 DIAGNOSIS — I10 Essential (primary) hypertension: Secondary | ICD-10-CM | POA: Diagnosis not present

## 2020-03-06 DIAGNOSIS — Z8614 Personal history of Methicillin resistant Staphylococcus aureus infection: Secondary | ICD-10-CM | POA: Diagnosis not present

## 2020-03-06 DIAGNOSIS — M869 Osteomyelitis, unspecified: Secondary | ICD-10-CM | POA: Diagnosis not present

## 2020-03-06 DIAGNOSIS — E11621 Type 2 diabetes mellitus with foot ulcer: Secondary | ICD-10-CM | POA: Diagnosis not present

## 2020-03-09 DIAGNOSIS — Z89421 Acquired absence of other right toe(s): Secondary | ICD-10-CM | POA: Diagnosis not present

## 2020-03-09 DIAGNOSIS — F17291 Nicotine dependence, other tobacco product, in remission: Secondary | ICD-10-CM | POA: Diagnosis not present

## 2020-03-09 DIAGNOSIS — Z4801 Encounter for change or removal of surgical wound dressing: Secondary | ICD-10-CM | POA: Diagnosis not present

## 2020-03-09 DIAGNOSIS — F17210 Nicotine dependence, cigarettes, uncomplicated: Secondary | ICD-10-CM | POA: Diagnosis not present

## 2020-03-09 DIAGNOSIS — Z794 Long term (current) use of insulin: Secondary | ICD-10-CM | POA: Diagnosis not present

## 2020-03-09 DIAGNOSIS — B965 Pseudomonas (aeruginosa) (mallei) (pseudomallei) as the cause of diseases classified elsewhere: Secondary | ICD-10-CM | POA: Diagnosis not present

## 2020-03-09 DIAGNOSIS — F32A Depression, unspecified: Secondary | ICD-10-CM | POA: Diagnosis not present

## 2020-03-09 DIAGNOSIS — Z792 Long term (current) use of antibiotics: Secondary | ICD-10-CM | POA: Diagnosis not present

## 2020-03-09 DIAGNOSIS — I5022 Chronic systolic (congestive) heart failure: Secondary | ICD-10-CM | POA: Diagnosis not present

## 2020-03-09 DIAGNOSIS — E11621 Type 2 diabetes mellitus with foot ulcer: Secondary | ICD-10-CM | POA: Diagnosis not present

## 2020-03-09 DIAGNOSIS — Z7984 Long term (current) use of oral hypoglycemic drugs: Secondary | ICD-10-CM | POA: Diagnosis not present

## 2020-03-09 DIAGNOSIS — E785 Hyperlipidemia, unspecified: Secondary | ICD-10-CM | POA: Diagnosis not present

## 2020-03-09 DIAGNOSIS — Z452 Encounter for adjustment and management of vascular access device: Secondary | ICD-10-CM | POA: Diagnosis not present

## 2020-03-09 DIAGNOSIS — L97412 Non-pressure chronic ulcer of right heel and midfoot with fat layer exposed: Secondary | ICD-10-CM | POA: Diagnosis not present

## 2020-03-09 DIAGNOSIS — B9562 Methicillin resistant Staphylococcus aureus infection as the cause of diseases classified elsewhere: Secondary | ICD-10-CM | POA: Diagnosis not present

## 2020-03-09 DIAGNOSIS — E1169 Type 2 diabetes mellitus with other specified complication: Secondary | ICD-10-CM | POA: Diagnosis not present

## 2020-03-09 DIAGNOSIS — E1140 Type 2 diabetes mellitus with diabetic neuropathy, unspecified: Secondary | ICD-10-CM | POA: Diagnosis not present

## 2020-03-09 DIAGNOSIS — M86171 Other acute osteomyelitis, right ankle and foot: Secondary | ICD-10-CM | POA: Diagnosis not present

## 2020-03-09 DIAGNOSIS — I2510 Atherosclerotic heart disease of native coronary artery without angina pectoris: Secondary | ICD-10-CM | POA: Diagnosis not present

## 2020-03-09 DIAGNOSIS — Z5181 Encounter for therapeutic drug level monitoring: Secondary | ICD-10-CM | POA: Diagnosis not present

## 2020-03-09 DIAGNOSIS — S92321D Displaced fracture of second metatarsal bone, right foot, subsequent encounter for fracture with routine healing: Secondary | ICD-10-CM | POA: Diagnosis not present

## 2020-03-09 DIAGNOSIS — I110 Hypertensive heart disease with heart failure: Secondary | ICD-10-CM | POA: Diagnosis not present

## 2020-03-11 DIAGNOSIS — I110 Hypertensive heart disease with heart failure: Secondary | ICD-10-CM | POA: Diagnosis not present

## 2020-03-11 DIAGNOSIS — E1169 Type 2 diabetes mellitus with other specified complication: Secondary | ICD-10-CM | POA: Diagnosis not present

## 2020-03-11 DIAGNOSIS — Z5181 Encounter for therapeutic drug level monitoring: Secondary | ICD-10-CM | POA: Diagnosis not present

## 2020-03-11 DIAGNOSIS — F17210 Nicotine dependence, cigarettes, uncomplicated: Secondary | ICD-10-CM | POA: Diagnosis not present

## 2020-03-11 DIAGNOSIS — Z7984 Long term (current) use of oral hypoglycemic drugs: Secondary | ICD-10-CM | POA: Diagnosis not present

## 2020-03-11 DIAGNOSIS — E11621 Type 2 diabetes mellitus with foot ulcer: Secondary | ICD-10-CM | POA: Diagnosis not present

## 2020-03-11 DIAGNOSIS — Z4801 Encounter for change or removal of surgical wound dressing: Secondary | ICD-10-CM | POA: Diagnosis not present

## 2020-03-11 DIAGNOSIS — Z452 Encounter for adjustment and management of vascular access device: Secondary | ICD-10-CM | POA: Diagnosis not present

## 2020-03-11 DIAGNOSIS — S92321D Displaced fracture of second metatarsal bone, right foot, subsequent encounter for fracture with routine healing: Secondary | ICD-10-CM | POA: Diagnosis not present

## 2020-03-11 DIAGNOSIS — E1140 Type 2 diabetes mellitus with diabetic neuropathy, unspecified: Secondary | ICD-10-CM | POA: Diagnosis not present

## 2020-03-11 DIAGNOSIS — M86171 Other acute osteomyelitis, right ankle and foot: Secondary | ICD-10-CM | POA: Diagnosis not present

## 2020-03-11 DIAGNOSIS — Z794 Long term (current) use of insulin: Secondary | ICD-10-CM | POA: Diagnosis not present

## 2020-03-11 DIAGNOSIS — F17291 Nicotine dependence, other tobacco product, in remission: Secondary | ICD-10-CM | POA: Diagnosis not present

## 2020-03-11 DIAGNOSIS — F32A Depression, unspecified: Secondary | ICD-10-CM | POA: Diagnosis not present

## 2020-03-11 DIAGNOSIS — B965 Pseudomonas (aeruginosa) (mallei) (pseudomallei) as the cause of diseases classified elsewhere: Secondary | ICD-10-CM | POA: Diagnosis not present

## 2020-03-11 DIAGNOSIS — Z89421 Acquired absence of other right toe(s): Secondary | ICD-10-CM | POA: Diagnosis not present

## 2020-03-11 DIAGNOSIS — B9562 Methicillin resistant Staphylococcus aureus infection as the cause of diseases classified elsewhere: Secondary | ICD-10-CM | POA: Diagnosis not present

## 2020-03-11 DIAGNOSIS — L97412 Non-pressure chronic ulcer of right heel and midfoot with fat layer exposed: Secondary | ICD-10-CM | POA: Diagnosis not present

## 2020-03-11 DIAGNOSIS — Z792 Long term (current) use of antibiotics: Secondary | ICD-10-CM | POA: Diagnosis not present

## 2020-03-11 DIAGNOSIS — I5022 Chronic systolic (congestive) heart failure: Secondary | ICD-10-CM | POA: Diagnosis not present

## 2020-03-11 DIAGNOSIS — E785 Hyperlipidemia, unspecified: Secondary | ICD-10-CM | POA: Diagnosis not present

## 2020-03-11 DIAGNOSIS — I2510 Atherosclerotic heart disease of native coronary artery without angina pectoris: Secondary | ICD-10-CM | POA: Diagnosis not present

## 2020-03-13 DIAGNOSIS — I10 Essential (primary) hypertension: Secondary | ICD-10-CM | POA: Diagnosis not present

## 2020-03-13 DIAGNOSIS — E1140 Type 2 diabetes mellitus with diabetic neuropathy, unspecified: Secondary | ICD-10-CM | POA: Diagnosis not present

## 2020-03-13 DIAGNOSIS — M869 Osteomyelitis, unspecified: Secondary | ICD-10-CM | POA: Diagnosis not present

## 2020-03-13 DIAGNOSIS — Z794 Long term (current) use of insulin: Secondary | ICD-10-CM | POA: Diagnosis not present

## 2020-03-13 DIAGNOSIS — E7800 Pure hypercholesterolemia, unspecified: Secondary | ICD-10-CM | POA: Diagnosis not present

## 2020-03-13 DIAGNOSIS — Z7984 Long term (current) use of oral hypoglycemic drugs: Secondary | ICD-10-CM | POA: Diagnosis not present

## 2020-03-13 DIAGNOSIS — Z79899 Other long term (current) drug therapy: Secondary | ICD-10-CM | POA: Diagnosis not present

## 2020-03-13 DIAGNOSIS — L97412 Non-pressure chronic ulcer of right heel and midfoot with fat layer exposed: Secondary | ICD-10-CM | POA: Diagnosis not present

## 2020-03-13 DIAGNOSIS — M868X7 Other osteomyelitis, ankle and foot: Secondary | ICD-10-CM | POA: Diagnosis not present

## 2020-03-13 DIAGNOSIS — E1169 Type 2 diabetes mellitus with other specified complication: Secondary | ICD-10-CM | POA: Diagnosis not present

## 2020-03-13 DIAGNOSIS — Z7982 Long term (current) use of aspirin: Secondary | ICD-10-CM | POA: Diagnosis not present

## 2020-03-13 DIAGNOSIS — E11621 Type 2 diabetes mellitus with foot ulcer: Secondary | ICD-10-CM | POA: Diagnosis not present

## 2020-03-13 DIAGNOSIS — I2510 Atherosclerotic heart disease of native coronary artery without angina pectoris: Secondary | ICD-10-CM | POA: Diagnosis not present

## 2020-03-16 DIAGNOSIS — Z5181 Encounter for therapeutic drug level monitoring: Secondary | ICD-10-CM | POA: Diagnosis not present

## 2020-03-16 DIAGNOSIS — E11621 Type 2 diabetes mellitus with foot ulcer: Secondary | ICD-10-CM | POA: Diagnosis not present

## 2020-03-16 DIAGNOSIS — E785 Hyperlipidemia, unspecified: Secondary | ICD-10-CM | POA: Diagnosis not present

## 2020-03-16 DIAGNOSIS — F32A Depression, unspecified: Secondary | ICD-10-CM | POA: Diagnosis not present

## 2020-03-16 DIAGNOSIS — F17291 Nicotine dependence, other tobacco product, in remission: Secondary | ICD-10-CM | POA: Diagnosis not present

## 2020-03-16 DIAGNOSIS — I110 Hypertensive heart disease with heart failure: Secondary | ICD-10-CM | POA: Diagnosis not present

## 2020-03-16 DIAGNOSIS — B9562 Methicillin resistant Staphylococcus aureus infection as the cause of diseases classified elsewhere: Secondary | ICD-10-CM | POA: Diagnosis not present

## 2020-03-16 DIAGNOSIS — L97412 Non-pressure chronic ulcer of right heel and midfoot with fat layer exposed: Secondary | ICD-10-CM | POA: Diagnosis not present

## 2020-03-16 DIAGNOSIS — Z4801 Encounter for change or removal of surgical wound dressing: Secondary | ICD-10-CM | POA: Diagnosis not present

## 2020-03-16 DIAGNOSIS — Z794 Long term (current) use of insulin: Secondary | ICD-10-CM | POA: Diagnosis not present

## 2020-03-16 DIAGNOSIS — Z7984 Long term (current) use of oral hypoglycemic drugs: Secondary | ICD-10-CM | POA: Diagnosis not present

## 2020-03-16 DIAGNOSIS — I5022 Chronic systolic (congestive) heart failure: Secondary | ICD-10-CM | POA: Diagnosis not present

## 2020-03-16 DIAGNOSIS — E1140 Type 2 diabetes mellitus with diabetic neuropathy, unspecified: Secondary | ICD-10-CM | POA: Diagnosis not present

## 2020-03-16 DIAGNOSIS — M86171 Other acute osteomyelitis, right ankle and foot: Secondary | ICD-10-CM | POA: Diagnosis not present

## 2020-03-16 DIAGNOSIS — Z89421 Acquired absence of other right toe(s): Secondary | ICD-10-CM | POA: Diagnosis not present

## 2020-03-16 DIAGNOSIS — Z792 Long term (current) use of antibiotics: Secondary | ICD-10-CM | POA: Diagnosis not present

## 2020-03-16 DIAGNOSIS — Z452 Encounter for adjustment and management of vascular access device: Secondary | ICD-10-CM | POA: Diagnosis not present

## 2020-03-16 DIAGNOSIS — I2510 Atherosclerotic heart disease of native coronary artery without angina pectoris: Secondary | ICD-10-CM | POA: Diagnosis not present

## 2020-03-16 DIAGNOSIS — F17210 Nicotine dependence, cigarettes, uncomplicated: Secondary | ICD-10-CM | POA: Diagnosis not present

## 2020-03-16 DIAGNOSIS — S92321D Displaced fracture of second metatarsal bone, right foot, subsequent encounter for fracture with routine healing: Secondary | ICD-10-CM | POA: Diagnosis not present

## 2020-03-16 DIAGNOSIS — B965 Pseudomonas (aeruginosa) (mallei) (pseudomallei) as the cause of diseases classified elsewhere: Secondary | ICD-10-CM | POA: Diagnosis not present

## 2020-03-16 DIAGNOSIS — E1169 Type 2 diabetes mellitus with other specified complication: Secondary | ICD-10-CM | POA: Diagnosis not present

## 2020-03-18 DIAGNOSIS — Z452 Encounter for adjustment and management of vascular access device: Secondary | ICD-10-CM | POA: Diagnosis not present

## 2020-03-18 DIAGNOSIS — I5022 Chronic systolic (congestive) heart failure: Secondary | ICD-10-CM | POA: Diagnosis not present

## 2020-03-18 DIAGNOSIS — F17291 Nicotine dependence, other tobacco product, in remission: Secondary | ICD-10-CM | POA: Diagnosis not present

## 2020-03-18 DIAGNOSIS — F17210 Nicotine dependence, cigarettes, uncomplicated: Secondary | ICD-10-CM | POA: Diagnosis not present

## 2020-03-18 DIAGNOSIS — I110 Hypertensive heart disease with heart failure: Secondary | ICD-10-CM | POA: Diagnosis not present

## 2020-03-18 DIAGNOSIS — Z794 Long term (current) use of insulin: Secondary | ICD-10-CM | POA: Diagnosis not present

## 2020-03-18 DIAGNOSIS — Z5181 Encounter for therapeutic drug level monitoring: Secondary | ICD-10-CM | POA: Diagnosis not present

## 2020-03-18 DIAGNOSIS — Z792 Long term (current) use of antibiotics: Secondary | ICD-10-CM | POA: Diagnosis not present

## 2020-03-18 DIAGNOSIS — Z7984 Long term (current) use of oral hypoglycemic drugs: Secondary | ICD-10-CM | POA: Diagnosis not present

## 2020-03-18 DIAGNOSIS — B965 Pseudomonas (aeruginosa) (mallei) (pseudomallei) as the cause of diseases classified elsewhere: Secondary | ICD-10-CM | POA: Diagnosis not present

## 2020-03-18 DIAGNOSIS — Z4801 Encounter for change or removal of surgical wound dressing: Secondary | ICD-10-CM | POA: Diagnosis not present

## 2020-03-18 DIAGNOSIS — I2510 Atherosclerotic heart disease of native coronary artery without angina pectoris: Secondary | ICD-10-CM | POA: Diagnosis not present

## 2020-03-18 DIAGNOSIS — B9562 Methicillin resistant Staphylococcus aureus infection as the cause of diseases classified elsewhere: Secondary | ICD-10-CM | POA: Diagnosis not present

## 2020-03-18 DIAGNOSIS — E785 Hyperlipidemia, unspecified: Secondary | ICD-10-CM | POA: Diagnosis not present

## 2020-03-18 DIAGNOSIS — E1169 Type 2 diabetes mellitus with other specified complication: Secondary | ICD-10-CM | POA: Diagnosis not present

## 2020-03-18 DIAGNOSIS — S92321D Displaced fracture of second metatarsal bone, right foot, subsequent encounter for fracture with routine healing: Secondary | ICD-10-CM | POA: Diagnosis not present

## 2020-03-18 DIAGNOSIS — E1140 Type 2 diabetes mellitus with diabetic neuropathy, unspecified: Secondary | ICD-10-CM | POA: Diagnosis not present

## 2020-03-18 DIAGNOSIS — E11621 Type 2 diabetes mellitus with foot ulcer: Secondary | ICD-10-CM | POA: Diagnosis not present

## 2020-03-18 DIAGNOSIS — F32A Depression, unspecified: Secondary | ICD-10-CM | POA: Diagnosis not present

## 2020-03-18 DIAGNOSIS — M86171 Other acute osteomyelitis, right ankle and foot: Secondary | ICD-10-CM | POA: Diagnosis not present

## 2020-03-18 DIAGNOSIS — Z89421 Acquired absence of other right toe(s): Secondary | ICD-10-CM | POA: Diagnosis not present

## 2020-03-18 DIAGNOSIS — L97412 Non-pressure chronic ulcer of right heel and midfoot with fat layer exposed: Secondary | ICD-10-CM | POA: Diagnosis not present

## 2020-03-20 DIAGNOSIS — I10 Essential (primary) hypertension: Secondary | ICD-10-CM | POA: Diagnosis not present

## 2020-03-20 DIAGNOSIS — I2510 Atherosclerotic heart disease of native coronary artery without angina pectoris: Secondary | ICD-10-CM | POA: Diagnosis not present

## 2020-03-20 DIAGNOSIS — E1140 Type 2 diabetes mellitus with diabetic neuropathy, unspecified: Secondary | ICD-10-CM | POA: Diagnosis not present

## 2020-03-20 DIAGNOSIS — E7800 Pure hypercholesterolemia, unspecified: Secondary | ICD-10-CM | POA: Diagnosis not present

## 2020-03-20 DIAGNOSIS — Z794 Long term (current) use of insulin: Secondary | ICD-10-CM | POA: Diagnosis not present

## 2020-03-20 DIAGNOSIS — E11621 Type 2 diabetes mellitus with foot ulcer: Secondary | ICD-10-CM | POA: Diagnosis not present

## 2020-03-20 DIAGNOSIS — Z7984 Long term (current) use of oral hypoglycemic drugs: Secondary | ICD-10-CM | POA: Diagnosis not present

## 2020-03-20 DIAGNOSIS — Z7982 Long term (current) use of aspirin: Secondary | ICD-10-CM | POA: Diagnosis not present

## 2020-03-20 DIAGNOSIS — Z79899 Other long term (current) drug therapy: Secondary | ICD-10-CM | POA: Diagnosis not present

## 2020-03-20 DIAGNOSIS — M868X7 Other osteomyelitis, ankle and foot: Secondary | ICD-10-CM | POA: Diagnosis not present

## 2020-03-20 DIAGNOSIS — L97412 Non-pressure chronic ulcer of right heel and midfoot with fat layer exposed: Secondary | ICD-10-CM | POA: Diagnosis not present

## 2020-03-20 DIAGNOSIS — E1169 Type 2 diabetes mellitus with other specified complication: Secondary | ICD-10-CM | POA: Diagnosis not present

## 2020-03-23 DIAGNOSIS — M86171 Other acute osteomyelitis, right ankle and foot: Secondary | ICD-10-CM | POA: Diagnosis not present

## 2020-03-23 DIAGNOSIS — E1169 Type 2 diabetes mellitus with other specified complication: Secondary | ICD-10-CM | POA: Diagnosis not present

## 2020-03-23 DIAGNOSIS — I5022 Chronic systolic (congestive) heart failure: Secondary | ICD-10-CM | POA: Diagnosis not present

## 2020-03-23 DIAGNOSIS — E785 Hyperlipidemia, unspecified: Secondary | ICD-10-CM | POA: Diagnosis not present

## 2020-03-23 DIAGNOSIS — Z4801 Encounter for change or removal of surgical wound dressing: Secondary | ICD-10-CM | POA: Diagnosis not present

## 2020-03-23 DIAGNOSIS — Z7984 Long term (current) use of oral hypoglycemic drugs: Secondary | ICD-10-CM | POA: Diagnosis not present

## 2020-03-23 DIAGNOSIS — F32A Depression, unspecified: Secondary | ICD-10-CM | POA: Diagnosis not present

## 2020-03-23 DIAGNOSIS — S92321D Displaced fracture of second metatarsal bone, right foot, subsequent encounter for fracture with routine healing: Secondary | ICD-10-CM | POA: Diagnosis not present

## 2020-03-23 DIAGNOSIS — B965 Pseudomonas (aeruginosa) (mallei) (pseudomallei) as the cause of diseases classified elsewhere: Secondary | ICD-10-CM | POA: Diagnosis not present

## 2020-03-23 DIAGNOSIS — Z452 Encounter for adjustment and management of vascular access device: Secondary | ICD-10-CM | POA: Diagnosis not present

## 2020-03-23 DIAGNOSIS — B9562 Methicillin resistant Staphylococcus aureus infection as the cause of diseases classified elsewhere: Secondary | ICD-10-CM | POA: Diagnosis not present

## 2020-03-23 DIAGNOSIS — Z794 Long term (current) use of insulin: Secondary | ICD-10-CM | POA: Diagnosis not present

## 2020-03-23 DIAGNOSIS — E11621 Type 2 diabetes mellitus with foot ulcer: Secondary | ICD-10-CM | POA: Diagnosis not present

## 2020-03-23 DIAGNOSIS — L97412 Non-pressure chronic ulcer of right heel and midfoot with fat layer exposed: Secondary | ICD-10-CM | POA: Diagnosis not present

## 2020-03-23 DIAGNOSIS — I110 Hypertensive heart disease with heart failure: Secondary | ICD-10-CM | POA: Diagnosis not present

## 2020-03-23 DIAGNOSIS — Z5181 Encounter for therapeutic drug level monitoring: Secondary | ICD-10-CM | POA: Diagnosis not present

## 2020-03-23 DIAGNOSIS — I2510 Atherosclerotic heart disease of native coronary artery without angina pectoris: Secondary | ICD-10-CM | POA: Diagnosis not present

## 2020-03-23 DIAGNOSIS — F17210 Nicotine dependence, cigarettes, uncomplicated: Secondary | ICD-10-CM | POA: Diagnosis not present

## 2020-03-23 DIAGNOSIS — F17291 Nicotine dependence, other tobacco product, in remission: Secondary | ICD-10-CM | POA: Diagnosis not present

## 2020-03-23 DIAGNOSIS — Z792 Long term (current) use of antibiotics: Secondary | ICD-10-CM | POA: Diagnosis not present

## 2020-03-23 DIAGNOSIS — E1140 Type 2 diabetes mellitus with diabetic neuropathy, unspecified: Secondary | ICD-10-CM | POA: Diagnosis not present

## 2020-03-23 DIAGNOSIS — Z89421 Acquired absence of other right toe(s): Secondary | ICD-10-CM | POA: Diagnosis not present

## 2020-03-25 DIAGNOSIS — Z4801 Encounter for change or removal of surgical wound dressing: Secondary | ICD-10-CM | POA: Diagnosis not present

## 2020-03-25 DIAGNOSIS — Z794 Long term (current) use of insulin: Secondary | ICD-10-CM | POA: Diagnosis not present

## 2020-03-25 DIAGNOSIS — Z7984 Long term (current) use of oral hypoglycemic drugs: Secondary | ICD-10-CM | POA: Diagnosis not present

## 2020-03-25 DIAGNOSIS — E1140 Type 2 diabetes mellitus with diabetic neuropathy, unspecified: Secondary | ICD-10-CM | POA: Diagnosis not present

## 2020-03-25 DIAGNOSIS — Z452 Encounter for adjustment and management of vascular access device: Secondary | ICD-10-CM | POA: Diagnosis not present

## 2020-03-25 DIAGNOSIS — F17291 Nicotine dependence, other tobacco product, in remission: Secondary | ICD-10-CM | POA: Diagnosis not present

## 2020-03-25 DIAGNOSIS — I110 Hypertensive heart disease with heart failure: Secondary | ICD-10-CM | POA: Diagnosis not present

## 2020-03-25 DIAGNOSIS — Z5181 Encounter for therapeutic drug level monitoring: Secondary | ICD-10-CM | POA: Diagnosis not present

## 2020-03-25 DIAGNOSIS — Z89421 Acquired absence of other right toe(s): Secondary | ICD-10-CM | POA: Diagnosis not present

## 2020-03-25 DIAGNOSIS — F32A Depression, unspecified: Secondary | ICD-10-CM | POA: Diagnosis not present

## 2020-03-25 DIAGNOSIS — L97412 Non-pressure chronic ulcer of right heel and midfoot with fat layer exposed: Secondary | ICD-10-CM | POA: Diagnosis not present

## 2020-03-25 DIAGNOSIS — I2510 Atherosclerotic heart disease of native coronary artery without angina pectoris: Secondary | ICD-10-CM | POA: Diagnosis not present

## 2020-03-25 DIAGNOSIS — S92321D Displaced fracture of second metatarsal bone, right foot, subsequent encounter for fracture with routine healing: Secondary | ICD-10-CM | POA: Diagnosis not present

## 2020-03-25 DIAGNOSIS — Z792 Long term (current) use of antibiotics: Secondary | ICD-10-CM | POA: Diagnosis not present

## 2020-03-25 DIAGNOSIS — E11621 Type 2 diabetes mellitus with foot ulcer: Secondary | ICD-10-CM | POA: Diagnosis not present

## 2020-03-25 DIAGNOSIS — M86171 Other acute osteomyelitis, right ankle and foot: Secondary | ICD-10-CM | POA: Diagnosis not present

## 2020-03-25 DIAGNOSIS — E785 Hyperlipidemia, unspecified: Secondary | ICD-10-CM | POA: Diagnosis not present

## 2020-03-25 DIAGNOSIS — F17210 Nicotine dependence, cigarettes, uncomplicated: Secondary | ICD-10-CM | POA: Diagnosis not present

## 2020-03-25 DIAGNOSIS — B9562 Methicillin resistant Staphylococcus aureus infection as the cause of diseases classified elsewhere: Secondary | ICD-10-CM | POA: Diagnosis not present

## 2020-03-25 DIAGNOSIS — I5022 Chronic systolic (congestive) heart failure: Secondary | ICD-10-CM | POA: Diagnosis not present

## 2020-03-25 DIAGNOSIS — E1169 Type 2 diabetes mellitus with other specified complication: Secondary | ICD-10-CM | POA: Diagnosis not present

## 2020-03-25 DIAGNOSIS — B965 Pseudomonas (aeruginosa) (mallei) (pseudomallei) as the cause of diseases classified elsewhere: Secondary | ICD-10-CM | POA: Diagnosis not present

## 2020-03-27 DIAGNOSIS — Z7984 Long term (current) use of oral hypoglycemic drugs: Secondary | ICD-10-CM | POA: Diagnosis not present

## 2020-03-27 DIAGNOSIS — L97412 Non-pressure chronic ulcer of right heel and midfoot with fat layer exposed: Secondary | ICD-10-CM | POA: Diagnosis not present

## 2020-03-27 DIAGNOSIS — I10 Essential (primary) hypertension: Secondary | ICD-10-CM | POA: Diagnosis not present

## 2020-03-27 DIAGNOSIS — Z7982 Long term (current) use of aspirin: Secondary | ICD-10-CM | POA: Diagnosis not present

## 2020-03-27 DIAGNOSIS — E11621 Type 2 diabetes mellitus with foot ulcer: Secondary | ICD-10-CM | POA: Diagnosis not present

## 2020-03-27 DIAGNOSIS — Z794 Long term (current) use of insulin: Secondary | ICD-10-CM | POA: Diagnosis not present

## 2020-03-27 DIAGNOSIS — E7800 Pure hypercholesterolemia, unspecified: Secondary | ICD-10-CM | POA: Diagnosis not present

## 2020-03-27 DIAGNOSIS — E1140 Type 2 diabetes mellitus with diabetic neuropathy, unspecified: Secondary | ICD-10-CM | POA: Diagnosis not present

## 2020-03-27 DIAGNOSIS — M868X7 Other osteomyelitis, ankle and foot: Secondary | ICD-10-CM | POA: Diagnosis not present

## 2020-03-27 DIAGNOSIS — Z79899 Other long term (current) drug therapy: Secondary | ICD-10-CM | POA: Diagnosis not present

## 2020-03-27 DIAGNOSIS — E1169 Type 2 diabetes mellitus with other specified complication: Secondary | ICD-10-CM | POA: Diagnosis not present

## 2020-03-27 DIAGNOSIS — I2510 Atherosclerotic heart disease of native coronary artery without angina pectoris: Secondary | ICD-10-CM | POA: Diagnosis not present

## 2020-03-30 DIAGNOSIS — Z89421 Acquired absence of other right toe(s): Secondary | ICD-10-CM | POA: Diagnosis not present

## 2020-03-30 DIAGNOSIS — Z792 Long term (current) use of antibiotics: Secondary | ICD-10-CM | POA: Diagnosis not present

## 2020-03-30 DIAGNOSIS — I2510 Atherosclerotic heart disease of native coronary artery without angina pectoris: Secondary | ICD-10-CM | POA: Diagnosis not present

## 2020-03-30 DIAGNOSIS — F17210 Nicotine dependence, cigarettes, uncomplicated: Secondary | ICD-10-CM | POA: Diagnosis not present

## 2020-03-30 DIAGNOSIS — I5022 Chronic systolic (congestive) heart failure: Secondary | ICD-10-CM | POA: Diagnosis not present

## 2020-03-30 DIAGNOSIS — F32A Depression, unspecified: Secondary | ICD-10-CM | POA: Diagnosis not present

## 2020-03-30 DIAGNOSIS — F17291 Nicotine dependence, other tobacco product, in remission: Secondary | ICD-10-CM | POA: Diagnosis not present

## 2020-03-30 DIAGNOSIS — M86171 Other acute osteomyelitis, right ankle and foot: Secondary | ICD-10-CM | POA: Diagnosis not present

## 2020-03-30 DIAGNOSIS — Z794 Long term (current) use of insulin: Secondary | ICD-10-CM | POA: Diagnosis not present

## 2020-03-30 DIAGNOSIS — Z4801 Encounter for change or removal of surgical wound dressing: Secondary | ICD-10-CM | POA: Diagnosis not present

## 2020-03-30 DIAGNOSIS — E785 Hyperlipidemia, unspecified: Secondary | ICD-10-CM | POA: Diagnosis not present

## 2020-03-30 DIAGNOSIS — B965 Pseudomonas (aeruginosa) (mallei) (pseudomallei) as the cause of diseases classified elsewhere: Secondary | ICD-10-CM | POA: Diagnosis not present

## 2020-03-30 DIAGNOSIS — E11621 Type 2 diabetes mellitus with foot ulcer: Secondary | ICD-10-CM | POA: Diagnosis not present

## 2020-03-30 DIAGNOSIS — E1169 Type 2 diabetes mellitus with other specified complication: Secondary | ICD-10-CM | POA: Diagnosis not present

## 2020-03-30 DIAGNOSIS — E1140 Type 2 diabetes mellitus with diabetic neuropathy, unspecified: Secondary | ICD-10-CM | POA: Diagnosis not present

## 2020-03-30 DIAGNOSIS — I110 Hypertensive heart disease with heart failure: Secondary | ICD-10-CM | POA: Diagnosis not present

## 2020-03-30 DIAGNOSIS — S92321D Displaced fracture of second metatarsal bone, right foot, subsequent encounter for fracture with routine healing: Secondary | ICD-10-CM | POA: Diagnosis not present

## 2020-03-30 DIAGNOSIS — B9562 Methicillin resistant Staphylococcus aureus infection as the cause of diseases classified elsewhere: Secondary | ICD-10-CM | POA: Diagnosis not present

## 2020-03-30 DIAGNOSIS — L97412 Non-pressure chronic ulcer of right heel and midfoot with fat layer exposed: Secondary | ICD-10-CM | POA: Diagnosis not present

## 2020-03-30 DIAGNOSIS — Z452 Encounter for adjustment and management of vascular access device: Secondary | ICD-10-CM | POA: Diagnosis not present

## 2020-03-30 DIAGNOSIS — Z7984 Long term (current) use of oral hypoglycemic drugs: Secondary | ICD-10-CM | POA: Diagnosis not present

## 2020-03-30 DIAGNOSIS — Z5181 Encounter for therapeutic drug level monitoring: Secondary | ICD-10-CM | POA: Diagnosis not present

## 2020-04-01 DIAGNOSIS — F17291 Nicotine dependence, other tobacco product, in remission: Secondary | ICD-10-CM | POA: Diagnosis not present

## 2020-04-01 DIAGNOSIS — Z452 Encounter for adjustment and management of vascular access device: Secondary | ICD-10-CM | POA: Diagnosis not present

## 2020-04-01 DIAGNOSIS — Z7984 Long term (current) use of oral hypoglycemic drugs: Secondary | ICD-10-CM | POA: Diagnosis not present

## 2020-04-01 DIAGNOSIS — F32A Depression, unspecified: Secondary | ICD-10-CM | POA: Diagnosis not present

## 2020-04-01 DIAGNOSIS — E1140 Type 2 diabetes mellitus with diabetic neuropathy, unspecified: Secondary | ICD-10-CM | POA: Diagnosis not present

## 2020-04-01 DIAGNOSIS — I2510 Atherosclerotic heart disease of native coronary artery without angina pectoris: Secondary | ICD-10-CM | POA: Diagnosis not present

## 2020-04-01 DIAGNOSIS — S92321D Displaced fracture of second metatarsal bone, right foot, subsequent encounter for fracture with routine healing: Secondary | ICD-10-CM | POA: Diagnosis not present

## 2020-04-01 DIAGNOSIS — E11621 Type 2 diabetes mellitus with foot ulcer: Secondary | ICD-10-CM | POA: Diagnosis not present

## 2020-04-01 DIAGNOSIS — Z792 Long term (current) use of antibiotics: Secondary | ICD-10-CM | POA: Diagnosis not present

## 2020-04-01 DIAGNOSIS — F17210 Nicotine dependence, cigarettes, uncomplicated: Secondary | ICD-10-CM | POA: Diagnosis not present

## 2020-04-01 DIAGNOSIS — M86171 Other acute osteomyelitis, right ankle and foot: Secondary | ICD-10-CM | POA: Diagnosis not present

## 2020-04-01 DIAGNOSIS — Z4801 Encounter for change or removal of surgical wound dressing: Secondary | ICD-10-CM | POA: Diagnosis not present

## 2020-04-01 DIAGNOSIS — Z794 Long term (current) use of insulin: Secondary | ICD-10-CM | POA: Diagnosis not present

## 2020-04-01 DIAGNOSIS — B965 Pseudomonas (aeruginosa) (mallei) (pseudomallei) as the cause of diseases classified elsewhere: Secondary | ICD-10-CM | POA: Diagnosis not present

## 2020-04-01 DIAGNOSIS — Z5181 Encounter for therapeutic drug level monitoring: Secondary | ICD-10-CM | POA: Diagnosis not present

## 2020-04-01 DIAGNOSIS — E785 Hyperlipidemia, unspecified: Secondary | ICD-10-CM | POA: Diagnosis not present

## 2020-04-01 DIAGNOSIS — L97412 Non-pressure chronic ulcer of right heel and midfoot with fat layer exposed: Secondary | ICD-10-CM | POA: Diagnosis not present

## 2020-04-01 DIAGNOSIS — Z89421 Acquired absence of other right toe(s): Secondary | ICD-10-CM | POA: Diagnosis not present

## 2020-04-01 DIAGNOSIS — E1169 Type 2 diabetes mellitus with other specified complication: Secondary | ICD-10-CM | POA: Diagnosis not present

## 2020-04-01 DIAGNOSIS — B9562 Methicillin resistant Staphylococcus aureus infection as the cause of diseases classified elsewhere: Secondary | ICD-10-CM | POA: Diagnosis not present

## 2020-04-01 DIAGNOSIS — I110 Hypertensive heart disease with heart failure: Secondary | ICD-10-CM | POA: Diagnosis not present

## 2020-04-01 DIAGNOSIS — I5022 Chronic systolic (congestive) heart failure: Secondary | ICD-10-CM | POA: Diagnosis not present

## 2020-04-03 DIAGNOSIS — Z7982 Long term (current) use of aspirin: Secondary | ICD-10-CM | POA: Diagnosis not present

## 2020-04-03 DIAGNOSIS — Z79899 Other long term (current) drug therapy: Secondary | ICD-10-CM | POA: Diagnosis not present

## 2020-04-03 DIAGNOSIS — I10 Essential (primary) hypertension: Secondary | ICD-10-CM | POA: Diagnosis not present

## 2020-04-03 DIAGNOSIS — E1140 Type 2 diabetes mellitus with diabetic neuropathy, unspecified: Secondary | ICD-10-CM | POA: Diagnosis not present

## 2020-04-03 DIAGNOSIS — E1169 Type 2 diabetes mellitus with other specified complication: Secondary | ICD-10-CM | POA: Diagnosis not present

## 2020-04-03 DIAGNOSIS — E7800 Pure hypercholesterolemia, unspecified: Secondary | ICD-10-CM | POA: Diagnosis not present

## 2020-04-03 DIAGNOSIS — M869 Osteomyelitis, unspecified: Secondary | ICD-10-CM | POA: Diagnosis not present

## 2020-04-03 DIAGNOSIS — I2510 Atherosclerotic heart disease of native coronary artery without angina pectoris: Secondary | ICD-10-CM | POA: Diagnosis not present

## 2020-04-03 DIAGNOSIS — Z794 Long term (current) use of insulin: Secondary | ICD-10-CM | POA: Diagnosis not present

## 2020-04-03 DIAGNOSIS — L97412 Non-pressure chronic ulcer of right heel and midfoot with fat layer exposed: Secondary | ICD-10-CM | POA: Diagnosis not present

## 2020-04-03 DIAGNOSIS — E11621 Type 2 diabetes mellitus with foot ulcer: Secondary | ICD-10-CM | POA: Diagnosis not present

## 2020-04-03 DIAGNOSIS — M868X7 Other osteomyelitis, ankle and foot: Secondary | ICD-10-CM | POA: Diagnosis not present

## 2020-04-03 DIAGNOSIS — Z7984 Long term (current) use of oral hypoglycemic drugs: Secondary | ICD-10-CM | POA: Diagnosis not present

## 2020-04-06 DIAGNOSIS — F17210 Nicotine dependence, cigarettes, uncomplicated: Secondary | ICD-10-CM | POA: Diagnosis not present

## 2020-04-06 DIAGNOSIS — F32A Depression, unspecified: Secondary | ICD-10-CM | POA: Diagnosis not present

## 2020-04-06 DIAGNOSIS — B965 Pseudomonas (aeruginosa) (mallei) (pseudomallei) as the cause of diseases classified elsewhere: Secondary | ICD-10-CM | POA: Diagnosis not present

## 2020-04-06 DIAGNOSIS — I5022 Chronic systolic (congestive) heart failure: Secondary | ICD-10-CM | POA: Diagnosis not present

## 2020-04-06 DIAGNOSIS — Z5181 Encounter for therapeutic drug level monitoring: Secondary | ICD-10-CM | POA: Diagnosis not present

## 2020-04-06 DIAGNOSIS — E1169 Type 2 diabetes mellitus with other specified complication: Secondary | ICD-10-CM | POA: Diagnosis not present

## 2020-04-06 DIAGNOSIS — E11621 Type 2 diabetes mellitus with foot ulcer: Secondary | ICD-10-CM | POA: Diagnosis not present

## 2020-04-06 DIAGNOSIS — Z7984 Long term (current) use of oral hypoglycemic drugs: Secondary | ICD-10-CM | POA: Diagnosis not present

## 2020-04-06 DIAGNOSIS — Z4801 Encounter for change or removal of surgical wound dressing: Secondary | ICD-10-CM | POA: Diagnosis not present

## 2020-04-06 DIAGNOSIS — S92321D Displaced fracture of second metatarsal bone, right foot, subsequent encounter for fracture with routine healing: Secondary | ICD-10-CM | POA: Diagnosis not present

## 2020-04-06 DIAGNOSIS — Z89421 Acquired absence of other right toe(s): Secondary | ICD-10-CM | POA: Diagnosis not present

## 2020-04-06 DIAGNOSIS — F17291 Nicotine dependence, other tobacco product, in remission: Secondary | ICD-10-CM | POA: Diagnosis not present

## 2020-04-06 DIAGNOSIS — Z794 Long term (current) use of insulin: Secondary | ICD-10-CM | POA: Diagnosis not present

## 2020-04-06 DIAGNOSIS — B9562 Methicillin resistant Staphylococcus aureus infection as the cause of diseases classified elsewhere: Secondary | ICD-10-CM | POA: Diagnosis not present

## 2020-04-06 DIAGNOSIS — E1140 Type 2 diabetes mellitus with diabetic neuropathy, unspecified: Secondary | ICD-10-CM | POA: Diagnosis not present

## 2020-04-06 DIAGNOSIS — E785 Hyperlipidemia, unspecified: Secondary | ICD-10-CM | POA: Diagnosis not present

## 2020-04-06 DIAGNOSIS — M86171 Other acute osteomyelitis, right ankle and foot: Secondary | ICD-10-CM | POA: Diagnosis not present

## 2020-04-06 DIAGNOSIS — Z792 Long term (current) use of antibiotics: Secondary | ICD-10-CM | POA: Diagnosis not present

## 2020-04-06 DIAGNOSIS — L97412 Non-pressure chronic ulcer of right heel and midfoot with fat layer exposed: Secondary | ICD-10-CM | POA: Diagnosis not present

## 2020-04-06 DIAGNOSIS — Z452 Encounter for adjustment and management of vascular access device: Secondary | ICD-10-CM | POA: Diagnosis not present

## 2020-04-06 DIAGNOSIS — I2510 Atherosclerotic heart disease of native coronary artery without angina pectoris: Secondary | ICD-10-CM | POA: Diagnosis not present

## 2020-04-06 DIAGNOSIS — I110 Hypertensive heart disease with heart failure: Secondary | ICD-10-CM | POA: Diagnosis not present

## 2020-04-08 DIAGNOSIS — F17210 Nicotine dependence, cigarettes, uncomplicated: Secondary | ICD-10-CM | POA: Diagnosis not present

## 2020-04-08 DIAGNOSIS — F32A Depression, unspecified: Secondary | ICD-10-CM | POA: Diagnosis not present

## 2020-04-08 DIAGNOSIS — B965 Pseudomonas (aeruginosa) (mallei) (pseudomallei) as the cause of diseases classified elsewhere: Secondary | ICD-10-CM | POA: Diagnosis not present

## 2020-04-08 DIAGNOSIS — Z89421 Acquired absence of other right toe(s): Secondary | ICD-10-CM | POA: Diagnosis not present

## 2020-04-08 DIAGNOSIS — I110 Hypertensive heart disease with heart failure: Secondary | ICD-10-CM | POA: Diagnosis not present

## 2020-04-08 DIAGNOSIS — E785 Hyperlipidemia, unspecified: Secondary | ICD-10-CM | POA: Diagnosis not present

## 2020-04-08 DIAGNOSIS — Z7984 Long term (current) use of oral hypoglycemic drugs: Secondary | ICD-10-CM | POA: Diagnosis not present

## 2020-04-08 DIAGNOSIS — Z452 Encounter for adjustment and management of vascular access device: Secondary | ICD-10-CM | POA: Diagnosis not present

## 2020-04-08 DIAGNOSIS — L97412 Non-pressure chronic ulcer of right heel and midfoot with fat layer exposed: Secondary | ICD-10-CM | POA: Diagnosis not present

## 2020-04-08 DIAGNOSIS — I5022 Chronic systolic (congestive) heart failure: Secondary | ICD-10-CM | POA: Diagnosis not present

## 2020-04-08 DIAGNOSIS — B9562 Methicillin resistant Staphylococcus aureus infection as the cause of diseases classified elsewhere: Secondary | ICD-10-CM | POA: Diagnosis not present

## 2020-04-08 DIAGNOSIS — Z792 Long term (current) use of antibiotics: Secondary | ICD-10-CM | POA: Diagnosis not present

## 2020-04-08 DIAGNOSIS — Z4801 Encounter for change or removal of surgical wound dressing: Secondary | ICD-10-CM | POA: Diagnosis not present

## 2020-04-08 DIAGNOSIS — M86171 Other acute osteomyelitis, right ankle and foot: Secondary | ICD-10-CM | POA: Diagnosis not present

## 2020-04-08 DIAGNOSIS — I2510 Atherosclerotic heart disease of native coronary artery without angina pectoris: Secondary | ICD-10-CM | POA: Diagnosis not present

## 2020-04-08 DIAGNOSIS — E11621 Type 2 diabetes mellitus with foot ulcer: Secondary | ICD-10-CM | POA: Diagnosis not present

## 2020-04-08 DIAGNOSIS — E1140 Type 2 diabetes mellitus with diabetic neuropathy, unspecified: Secondary | ICD-10-CM | POA: Diagnosis not present

## 2020-04-08 DIAGNOSIS — Z794 Long term (current) use of insulin: Secondary | ICD-10-CM | POA: Diagnosis not present

## 2020-04-08 DIAGNOSIS — F17291 Nicotine dependence, other tobacco product, in remission: Secondary | ICD-10-CM | POA: Diagnosis not present

## 2020-04-08 DIAGNOSIS — Z5181 Encounter for therapeutic drug level monitoring: Secondary | ICD-10-CM | POA: Diagnosis not present

## 2020-04-08 DIAGNOSIS — S92321D Displaced fracture of second metatarsal bone, right foot, subsequent encounter for fracture with routine healing: Secondary | ICD-10-CM | POA: Diagnosis not present

## 2020-04-08 DIAGNOSIS — E1169 Type 2 diabetes mellitus with other specified complication: Secondary | ICD-10-CM | POA: Diagnosis not present

## 2020-04-10 DIAGNOSIS — T8189XA Other complications of procedures, not elsewhere classified, initial encounter: Secondary | ICD-10-CM | POA: Diagnosis not present

## 2020-04-10 DIAGNOSIS — E1169 Type 2 diabetes mellitus with other specified complication: Secondary | ICD-10-CM | POA: Diagnosis not present

## 2020-04-10 DIAGNOSIS — Z79899 Other long term (current) drug therapy: Secondary | ICD-10-CM | POA: Diagnosis not present

## 2020-04-10 DIAGNOSIS — L97513 Non-pressure chronic ulcer of other part of right foot with necrosis of muscle: Secondary | ICD-10-CM | POA: Diagnosis not present

## 2020-04-10 DIAGNOSIS — Z794 Long term (current) use of insulin: Secondary | ICD-10-CM | POA: Diagnosis not present

## 2020-04-10 DIAGNOSIS — E11621 Type 2 diabetes mellitus with foot ulcer: Secondary | ICD-10-CM | POA: Diagnosis not present

## 2020-04-10 DIAGNOSIS — Z7984 Long term (current) use of oral hypoglycemic drugs: Secondary | ICD-10-CM | POA: Diagnosis not present

## 2020-04-10 DIAGNOSIS — I10 Essential (primary) hypertension: Secondary | ICD-10-CM | POA: Diagnosis not present

## 2020-04-10 DIAGNOSIS — E7800 Pure hypercholesterolemia, unspecified: Secondary | ICD-10-CM | POA: Diagnosis not present

## 2020-04-10 DIAGNOSIS — L97412 Non-pressure chronic ulcer of right heel and midfoot with fat layer exposed: Secondary | ICD-10-CM | POA: Diagnosis not present

## 2020-04-10 DIAGNOSIS — E1140 Type 2 diabetes mellitus with diabetic neuropathy, unspecified: Secondary | ICD-10-CM | POA: Diagnosis not present

## 2020-04-10 DIAGNOSIS — M868X7 Other osteomyelitis, ankle and foot: Secondary | ICD-10-CM | POA: Diagnosis not present

## 2020-04-10 DIAGNOSIS — Z7982 Long term (current) use of aspirin: Secondary | ICD-10-CM | POA: Diagnosis not present

## 2020-04-10 DIAGNOSIS — I2510 Atherosclerotic heart disease of native coronary artery without angina pectoris: Secondary | ICD-10-CM | POA: Diagnosis not present

## 2020-04-13 DIAGNOSIS — E1140 Type 2 diabetes mellitus with diabetic neuropathy, unspecified: Secondary | ICD-10-CM | POA: Diagnosis not present

## 2020-04-13 DIAGNOSIS — Z794 Long term (current) use of insulin: Secondary | ICD-10-CM | POA: Diagnosis not present

## 2020-04-13 DIAGNOSIS — Z452 Encounter for adjustment and management of vascular access device: Secondary | ICD-10-CM | POA: Diagnosis not present

## 2020-04-13 DIAGNOSIS — M86171 Other acute osteomyelitis, right ankle and foot: Secondary | ICD-10-CM | POA: Diagnosis not present

## 2020-04-13 DIAGNOSIS — B9562 Methicillin resistant Staphylococcus aureus infection as the cause of diseases classified elsewhere: Secondary | ICD-10-CM | POA: Diagnosis not present

## 2020-04-13 DIAGNOSIS — F32A Depression, unspecified: Secondary | ICD-10-CM | POA: Diagnosis not present

## 2020-04-13 DIAGNOSIS — Z792 Long term (current) use of antibiotics: Secondary | ICD-10-CM | POA: Diagnosis not present

## 2020-04-13 DIAGNOSIS — F17210 Nicotine dependence, cigarettes, uncomplicated: Secondary | ICD-10-CM | POA: Diagnosis not present

## 2020-04-13 DIAGNOSIS — I110 Hypertensive heart disease with heart failure: Secondary | ICD-10-CM | POA: Diagnosis not present

## 2020-04-13 DIAGNOSIS — Z4801 Encounter for change or removal of surgical wound dressing: Secondary | ICD-10-CM | POA: Diagnosis not present

## 2020-04-13 DIAGNOSIS — E785 Hyperlipidemia, unspecified: Secondary | ICD-10-CM | POA: Diagnosis not present

## 2020-04-13 DIAGNOSIS — F17291 Nicotine dependence, other tobacco product, in remission: Secondary | ICD-10-CM | POA: Diagnosis not present

## 2020-04-13 DIAGNOSIS — I5022 Chronic systolic (congestive) heart failure: Secondary | ICD-10-CM | POA: Diagnosis not present

## 2020-04-13 DIAGNOSIS — B965 Pseudomonas (aeruginosa) (mallei) (pseudomallei) as the cause of diseases classified elsewhere: Secondary | ICD-10-CM | POA: Diagnosis not present

## 2020-04-13 DIAGNOSIS — I2510 Atherosclerotic heart disease of native coronary artery without angina pectoris: Secondary | ICD-10-CM | POA: Diagnosis not present

## 2020-04-13 DIAGNOSIS — E1169 Type 2 diabetes mellitus with other specified complication: Secondary | ICD-10-CM | POA: Diagnosis not present

## 2020-04-13 DIAGNOSIS — L97412 Non-pressure chronic ulcer of right heel and midfoot with fat layer exposed: Secondary | ICD-10-CM | POA: Diagnosis not present

## 2020-04-13 DIAGNOSIS — S92321D Displaced fracture of second metatarsal bone, right foot, subsequent encounter for fracture with routine healing: Secondary | ICD-10-CM | POA: Diagnosis not present

## 2020-04-13 DIAGNOSIS — E11621 Type 2 diabetes mellitus with foot ulcer: Secondary | ICD-10-CM | POA: Diagnosis not present

## 2020-04-13 DIAGNOSIS — Z5181 Encounter for therapeutic drug level monitoring: Secondary | ICD-10-CM | POA: Diagnosis not present

## 2020-04-13 DIAGNOSIS — Z89421 Acquired absence of other right toe(s): Secondary | ICD-10-CM | POA: Diagnosis not present

## 2020-04-13 DIAGNOSIS — Z7984 Long term (current) use of oral hypoglycemic drugs: Secondary | ICD-10-CM | POA: Diagnosis not present

## 2020-04-14 DIAGNOSIS — T8189XA Other complications of procedures, not elsewhere classified, initial encounter: Secondary | ICD-10-CM | POA: Diagnosis not present

## 2020-04-15 DIAGNOSIS — B965 Pseudomonas (aeruginosa) (mallei) (pseudomallei) as the cause of diseases classified elsewhere: Secondary | ICD-10-CM | POA: Diagnosis not present

## 2020-04-15 DIAGNOSIS — S92321D Displaced fracture of second metatarsal bone, right foot, subsequent encounter for fracture with routine healing: Secondary | ICD-10-CM | POA: Diagnosis not present

## 2020-04-15 DIAGNOSIS — E785 Hyperlipidemia, unspecified: Secondary | ICD-10-CM | POA: Diagnosis not present

## 2020-04-15 DIAGNOSIS — Z89421 Acquired absence of other right toe(s): Secondary | ICD-10-CM | POA: Diagnosis not present

## 2020-04-15 DIAGNOSIS — I5022 Chronic systolic (congestive) heart failure: Secondary | ICD-10-CM | POA: Diagnosis not present

## 2020-04-15 DIAGNOSIS — F17291 Nicotine dependence, other tobacco product, in remission: Secondary | ICD-10-CM | POA: Diagnosis not present

## 2020-04-15 DIAGNOSIS — Z792 Long term (current) use of antibiotics: Secondary | ICD-10-CM | POA: Diagnosis not present

## 2020-04-15 DIAGNOSIS — Z794 Long term (current) use of insulin: Secondary | ICD-10-CM | POA: Diagnosis not present

## 2020-04-15 DIAGNOSIS — F32A Depression, unspecified: Secondary | ICD-10-CM | POA: Diagnosis not present

## 2020-04-15 DIAGNOSIS — M86171 Other acute osteomyelitis, right ankle and foot: Secondary | ICD-10-CM | POA: Diagnosis not present

## 2020-04-15 DIAGNOSIS — F17210 Nicotine dependence, cigarettes, uncomplicated: Secondary | ICD-10-CM | POA: Diagnosis not present

## 2020-04-15 DIAGNOSIS — Z4801 Encounter for change or removal of surgical wound dressing: Secondary | ICD-10-CM | POA: Diagnosis not present

## 2020-04-15 DIAGNOSIS — Z452 Encounter for adjustment and management of vascular access device: Secondary | ICD-10-CM | POA: Diagnosis not present

## 2020-04-15 DIAGNOSIS — B9562 Methicillin resistant Staphylococcus aureus infection as the cause of diseases classified elsewhere: Secondary | ICD-10-CM | POA: Diagnosis not present

## 2020-04-15 DIAGNOSIS — E1140 Type 2 diabetes mellitus with diabetic neuropathy, unspecified: Secondary | ICD-10-CM | POA: Diagnosis not present

## 2020-04-15 DIAGNOSIS — E11621 Type 2 diabetes mellitus with foot ulcer: Secondary | ICD-10-CM | POA: Diagnosis not present

## 2020-04-15 DIAGNOSIS — I110 Hypertensive heart disease with heart failure: Secondary | ICD-10-CM | POA: Diagnosis not present

## 2020-04-15 DIAGNOSIS — E1169 Type 2 diabetes mellitus with other specified complication: Secondary | ICD-10-CM | POA: Diagnosis not present

## 2020-04-15 DIAGNOSIS — I2510 Atherosclerotic heart disease of native coronary artery without angina pectoris: Secondary | ICD-10-CM | POA: Diagnosis not present

## 2020-04-15 DIAGNOSIS — Z5181 Encounter for therapeutic drug level monitoring: Secondary | ICD-10-CM | POA: Diagnosis not present

## 2020-04-15 DIAGNOSIS — Z7984 Long term (current) use of oral hypoglycemic drugs: Secondary | ICD-10-CM | POA: Diagnosis not present

## 2020-04-15 DIAGNOSIS — L97412 Non-pressure chronic ulcer of right heel and midfoot with fat layer exposed: Secondary | ICD-10-CM | POA: Diagnosis not present

## 2020-04-17 DIAGNOSIS — M868X7 Other osteomyelitis, ankle and foot: Secondary | ICD-10-CM | POA: Diagnosis not present

## 2020-04-17 DIAGNOSIS — E11621 Type 2 diabetes mellitus with foot ulcer: Secondary | ICD-10-CM | POA: Diagnosis not present

## 2020-04-17 DIAGNOSIS — I10 Essential (primary) hypertension: Secondary | ICD-10-CM | POA: Diagnosis not present

## 2020-04-17 DIAGNOSIS — E1140 Type 2 diabetes mellitus with diabetic neuropathy, unspecified: Secondary | ICD-10-CM | POA: Diagnosis not present

## 2020-04-17 DIAGNOSIS — E1169 Type 2 diabetes mellitus with other specified complication: Secondary | ICD-10-CM | POA: Diagnosis not present

## 2020-04-17 DIAGNOSIS — E7800 Pure hypercholesterolemia, unspecified: Secondary | ICD-10-CM | POA: Diagnosis not present

## 2020-04-17 DIAGNOSIS — Z7982 Long term (current) use of aspirin: Secondary | ICD-10-CM | POA: Diagnosis not present

## 2020-04-17 DIAGNOSIS — I2510 Atherosclerotic heart disease of native coronary artery without angina pectoris: Secondary | ICD-10-CM | POA: Diagnosis not present

## 2020-04-17 DIAGNOSIS — L97412 Non-pressure chronic ulcer of right heel and midfoot with fat layer exposed: Secondary | ICD-10-CM | POA: Diagnosis not present

## 2020-04-17 DIAGNOSIS — M869 Osteomyelitis, unspecified: Secondary | ICD-10-CM | POA: Diagnosis not present

## 2020-04-17 DIAGNOSIS — Z7984 Long term (current) use of oral hypoglycemic drugs: Secondary | ICD-10-CM | POA: Diagnosis not present

## 2020-04-17 DIAGNOSIS — Z79899 Other long term (current) drug therapy: Secondary | ICD-10-CM | POA: Diagnosis not present

## 2020-04-17 DIAGNOSIS — Z794 Long term (current) use of insulin: Secondary | ICD-10-CM | POA: Diagnosis not present

## 2020-04-20 DIAGNOSIS — L97412 Non-pressure chronic ulcer of right heel and midfoot with fat layer exposed: Secondary | ICD-10-CM | POA: Diagnosis not present

## 2020-04-20 DIAGNOSIS — B965 Pseudomonas (aeruginosa) (mallei) (pseudomallei) as the cause of diseases classified elsewhere: Secondary | ICD-10-CM | POA: Diagnosis not present

## 2020-04-20 DIAGNOSIS — M869 Osteomyelitis, unspecified: Secondary | ICD-10-CM | POA: Diagnosis not present

## 2020-04-20 DIAGNOSIS — B9562 Methicillin resistant Staphylococcus aureus infection as the cause of diseases classified elsewhere: Secondary | ICD-10-CM | POA: Diagnosis not present

## 2020-04-20 DIAGNOSIS — E11621 Type 2 diabetes mellitus with foot ulcer: Secondary | ICD-10-CM | POA: Diagnosis not present

## 2020-04-20 DIAGNOSIS — E1169 Type 2 diabetes mellitus with other specified complication: Secondary | ICD-10-CM | POA: Diagnosis not present

## 2020-04-22 DIAGNOSIS — M869 Osteomyelitis, unspecified: Secondary | ICD-10-CM | POA: Diagnosis not present

## 2020-04-22 DIAGNOSIS — E1169 Type 2 diabetes mellitus with other specified complication: Secondary | ICD-10-CM | POA: Diagnosis not present

## 2020-04-22 DIAGNOSIS — L97412 Non-pressure chronic ulcer of right heel and midfoot with fat layer exposed: Secondary | ICD-10-CM | POA: Diagnosis not present

## 2020-04-22 DIAGNOSIS — B9562 Methicillin resistant Staphylococcus aureus infection as the cause of diseases classified elsewhere: Secondary | ICD-10-CM | POA: Diagnosis not present

## 2020-04-22 DIAGNOSIS — E11621 Type 2 diabetes mellitus with foot ulcer: Secondary | ICD-10-CM | POA: Diagnosis not present

## 2020-04-22 DIAGNOSIS — B965 Pseudomonas (aeruginosa) (mallei) (pseudomallei) as the cause of diseases classified elsewhere: Secondary | ICD-10-CM | POA: Diagnosis not present

## 2020-04-24 DIAGNOSIS — Z794 Long term (current) use of insulin: Secondary | ICD-10-CM | POA: Diagnosis not present

## 2020-04-24 DIAGNOSIS — E11621 Type 2 diabetes mellitus with foot ulcer: Secondary | ICD-10-CM | POA: Diagnosis not present

## 2020-04-24 DIAGNOSIS — E1140 Type 2 diabetes mellitus with diabetic neuropathy, unspecified: Secondary | ICD-10-CM | POA: Diagnosis not present

## 2020-04-24 DIAGNOSIS — Z7982 Long term (current) use of aspirin: Secondary | ICD-10-CM | POA: Diagnosis not present

## 2020-04-24 DIAGNOSIS — E7800 Pure hypercholesterolemia, unspecified: Secondary | ICD-10-CM | POA: Diagnosis not present

## 2020-04-24 DIAGNOSIS — Z7984 Long term (current) use of oral hypoglycemic drugs: Secondary | ICD-10-CM | POA: Diagnosis not present

## 2020-04-24 DIAGNOSIS — L97412 Non-pressure chronic ulcer of right heel and midfoot with fat layer exposed: Secondary | ICD-10-CM | POA: Diagnosis not present

## 2020-04-24 DIAGNOSIS — I2510 Atherosclerotic heart disease of native coronary artery without angina pectoris: Secondary | ICD-10-CM | POA: Diagnosis not present

## 2020-04-24 DIAGNOSIS — M868X7 Other osteomyelitis, ankle and foot: Secondary | ICD-10-CM | POA: Diagnosis not present

## 2020-04-24 DIAGNOSIS — Z79899 Other long term (current) drug therapy: Secondary | ICD-10-CM | POA: Diagnosis not present

## 2020-04-24 DIAGNOSIS — I10 Essential (primary) hypertension: Secondary | ICD-10-CM | POA: Diagnosis not present

## 2020-04-24 DIAGNOSIS — E1169 Type 2 diabetes mellitus with other specified complication: Secondary | ICD-10-CM | POA: Diagnosis not present

## 2020-04-27 DIAGNOSIS — E11621 Type 2 diabetes mellitus with foot ulcer: Secondary | ICD-10-CM | POA: Diagnosis not present

## 2020-04-27 DIAGNOSIS — E1169 Type 2 diabetes mellitus with other specified complication: Secondary | ICD-10-CM | POA: Diagnosis not present

## 2020-04-27 DIAGNOSIS — M869 Osteomyelitis, unspecified: Secondary | ICD-10-CM | POA: Diagnosis not present

## 2020-04-27 DIAGNOSIS — B965 Pseudomonas (aeruginosa) (mallei) (pseudomallei) as the cause of diseases classified elsewhere: Secondary | ICD-10-CM | POA: Diagnosis not present

## 2020-04-27 DIAGNOSIS — L97412 Non-pressure chronic ulcer of right heel and midfoot with fat layer exposed: Secondary | ICD-10-CM | POA: Diagnosis not present

## 2020-04-27 DIAGNOSIS — B9562 Methicillin resistant Staphylococcus aureus infection as the cause of diseases classified elsewhere: Secondary | ICD-10-CM | POA: Diagnosis not present

## 2020-04-29 DIAGNOSIS — E1169 Type 2 diabetes mellitus with other specified complication: Secondary | ICD-10-CM | POA: Diagnosis not present

## 2020-04-29 DIAGNOSIS — B9562 Methicillin resistant Staphylococcus aureus infection as the cause of diseases classified elsewhere: Secondary | ICD-10-CM | POA: Diagnosis not present

## 2020-04-29 DIAGNOSIS — M869 Osteomyelitis, unspecified: Secondary | ICD-10-CM | POA: Diagnosis not present

## 2020-04-29 DIAGNOSIS — L97412 Non-pressure chronic ulcer of right heel and midfoot with fat layer exposed: Secondary | ICD-10-CM | POA: Diagnosis not present

## 2020-04-29 DIAGNOSIS — E11621 Type 2 diabetes mellitus with foot ulcer: Secondary | ICD-10-CM | POA: Diagnosis not present

## 2020-04-29 DIAGNOSIS — B965 Pseudomonas (aeruginosa) (mallei) (pseudomallei) as the cause of diseases classified elsewhere: Secondary | ICD-10-CM | POA: Diagnosis not present

## 2020-05-01 DIAGNOSIS — Z7982 Long term (current) use of aspirin: Secondary | ICD-10-CM | POA: Diagnosis not present

## 2020-05-01 DIAGNOSIS — I2510 Atherosclerotic heart disease of native coronary artery without angina pectoris: Secondary | ICD-10-CM | POA: Diagnosis not present

## 2020-05-01 DIAGNOSIS — E7800 Pure hypercholesterolemia, unspecified: Secondary | ICD-10-CM | POA: Diagnosis not present

## 2020-05-01 DIAGNOSIS — E1140 Type 2 diabetes mellitus with diabetic neuropathy, unspecified: Secondary | ICD-10-CM | POA: Diagnosis not present

## 2020-05-01 DIAGNOSIS — Z794 Long term (current) use of insulin: Secondary | ICD-10-CM | POA: Diagnosis not present

## 2020-05-01 DIAGNOSIS — Z79899 Other long term (current) drug therapy: Secondary | ICD-10-CM | POA: Diagnosis not present

## 2020-05-01 DIAGNOSIS — E1169 Type 2 diabetes mellitus with other specified complication: Secondary | ICD-10-CM | POA: Diagnosis not present

## 2020-05-01 DIAGNOSIS — Z7984 Long term (current) use of oral hypoglycemic drugs: Secondary | ICD-10-CM | POA: Diagnosis not present

## 2020-05-01 DIAGNOSIS — I10 Essential (primary) hypertension: Secondary | ICD-10-CM | POA: Diagnosis not present

## 2020-05-01 DIAGNOSIS — M868X7 Other osteomyelitis, ankle and foot: Secondary | ICD-10-CM | POA: Diagnosis not present

## 2020-05-01 DIAGNOSIS — E11621 Type 2 diabetes mellitus with foot ulcer: Secondary | ICD-10-CM | POA: Diagnosis not present

## 2020-05-01 DIAGNOSIS — L97412 Non-pressure chronic ulcer of right heel and midfoot with fat layer exposed: Secondary | ICD-10-CM | POA: Diagnosis not present

## 2020-05-04 DIAGNOSIS — M869 Osteomyelitis, unspecified: Secondary | ICD-10-CM | POA: Diagnosis not present

## 2020-05-04 DIAGNOSIS — B9562 Methicillin resistant Staphylococcus aureus infection as the cause of diseases classified elsewhere: Secondary | ICD-10-CM | POA: Diagnosis not present

## 2020-05-04 DIAGNOSIS — E1169 Type 2 diabetes mellitus with other specified complication: Secondary | ICD-10-CM | POA: Diagnosis not present

## 2020-05-04 DIAGNOSIS — E11621 Type 2 diabetes mellitus with foot ulcer: Secondary | ICD-10-CM | POA: Diagnosis not present

## 2020-05-04 DIAGNOSIS — B965 Pseudomonas (aeruginosa) (mallei) (pseudomallei) as the cause of diseases classified elsewhere: Secondary | ICD-10-CM | POA: Diagnosis not present

## 2020-05-04 DIAGNOSIS — L97412 Non-pressure chronic ulcer of right heel and midfoot with fat layer exposed: Secondary | ICD-10-CM | POA: Diagnosis not present

## 2020-05-06 ENCOUNTER — Other Ambulatory Visit: Payer: Self-pay

## 2020-05-06 ENCOUNTER — Encounter (HOSPITAL_BASED_OUTPATIENT_CLINIC_OR_DEPARTMENT_OTHER): Payer: Medicare Other | Attending: Physician Assistant | Admitting: Physician Assistant

## 2020-05-06 DIAGNOSIS — E1151 Type 2 diabetes mellitus with diabetic peripheral angiopathy without gangrene: Secondary | ICD-10-CM | POA: Diagnosis not present

## 2020-05-06 DIAGNOSIS — I5042 Chronic combined systolic (congestive) and diastolic (congestive) heart failure: Secondary | ICD-10-CM | POA: Insufficient documentation

## 2020-05-06 DIAGNOSIS — E11621 Type 2 diabetes mellitus with foot ulcer: Secondary | ICD-10-CM | POA: Diagnosis not present

## 2020-05-06 DIAGNOSIS — F17218 Nicotine dependence, cigarettes, with other nicotine-induced disorders: Secondary | ICD-10-CM | POA: Insufficient documentation

## 2020-05-06 DIAGNOSIS — I110 Hypertensive heart disease with heart failure: Secondary | ICD-10-CM | POA: Diagnosis not present

## 2020-05-06 DIAGNOSIS — Z794 Long term (current) use of insulin: Secondary | ICD-10-CM | POA: Diagnosis not present

## 2020-05-06 DIAGNOSIS — L97529 Non-pressure chronic ulcer of other part of left foot with unspecified severity: Secondary | ICD-10-CM | POA: Diagnosis not present

## 2020-05-06 DIAGNOSIS — L97512 Non-pressure chronic ulcer of other part of right foot with fat layer exposed: Secondary | ICD-10-CM | POA: Insufficient documentation

## 2020-05-06 DIAGNOSIS — Z8249 Family history of ischemic heart disease and other diseases of the circulatory system: Secondary | ICD-10-CM | POA: Insufficient documentation

## 2020-05-06 DIAGNOSIS — E1142 Type 2 diabetes mellitus with diabetic polyneuropathy: Secondary | ICD-10-CM | POA: Insufficient documentation

## 2020-05-06 DIAGNOSIS — L97516 Non-pressure chronic ulcer of other part of right foot with bone involvement without evidence of necrosis: Secondary | ICD-10-CM | POA: Diagnosis not present

## 2020-05-07 NOTE — Progress Notes (Signed)
[name], VANGORDER ([id number]) Visit Report for 05/06/2020 Chief Complaint Document Details Patient Name: Date of Service: [name], RA NDO [facility] 05/06/2020 9:00 A M Medical Record Number: [id number] Patient Account Number: [id number] Date of Birth/Sex: Treating RN: May 16, 1970 (50 y.o. [name] Primary Care Provider: [name] Other Clinician: Referring Provider: Treating Provider/Extender: [name] in Treatment: 0 Information Obtained from: Patient Chief Complaint Right foot ulcer Electronic Signature(s) Signed: 05/06/2020 9:23:58 AM By: Lenda Kelp PA-C Entered By: Lenda Kelp on 05/06/2020 09:23:58 -------------------------------------------------------------------------------- Debridement Details Patient Name: Date of Service: [name], RA NDO [facility] 05/06/2020 9:00 A M Medical Record Number: [id number] Patient Account Number: [id number] Date of Birth/Sex: Treating RN: 03-29-1970 (50 y.o. [name] Primary Care Provider: [name] Other Clinician: Referring Provider: Treating Provider/Extender: [name] in Treatment: 0 Debridement Performed for Assessment: Wound #3 Right,Medial Foot Performed By: Physician Lenda Kelp, PA Debridement Type: Debridement Severity of Tissue Pre Debridement: Bone involvement without necrosis Level of Consciousness (Pre-procedure): Awake and Alert Pre-procedure Verification/Time Out Yes - 09:45 Taken: Start Time: 09:46 Pain Control: Lidocaine 4% T opical Solution T Area Debrided (L x W): otal 3.5 (cm) x 2.5 (cm) = 8.75 (cm) Tissue and other material debrided: Viable, Non-Viable, Callus, Slough, Subcutaneous, Skin: Epidermis, Slough Level: Skin/Subcutaneous Tissue Debridement Description: Excisional Instrument: Curette Bleeding: Minimum Hemostasis Achieved: Pressure End Time: 09:58 Procedural Pain: 0 Post Procedural Pain: 0 Response to  Treatment: Procedure was tolerated well Level of Consciousness (Post- Awake and Alert procedure): Post Debridement Measurements of Total Wound Length: (cm) 2 Width: (cm) 0.6 Depth: (cm) 0.5 Volume: (cm) 0.471 Character of Wound/Ulcer Post Debridement: Improved Severity of Tissue Post Debridement: Bone involvement without necrosis Post Procedure Diagnosis Same as Pre-procedure Electronic Signature(s) Signed: 05/06/2020 11:32:37 AM By: Lenda Kelp PA-C Signed: 05/06/2020 6:20:01 PM By: [name] RN, BSN Entered By: [name] on 05/06/2020 09:58:29 -------------------------------------------------------------------------------- HPI Details Patient Name: Date of Service: [name], RA NDO [facility] 05/06/2020 9:00 A M Medical Record Number: [id number] Patient Account Number: [id number] Date of Birth/Sex: Treating RN: Jan 01, 1971 (50 y.o. [name] Primary Care Provider: [name] Other Clinician: Referring Provider: Treating Provider/Extender: [name] in Treatment: 0 History of Present Illness HPI Description: ADMISSION 05/13/2019 This is a 50 year old man who has type 2 diabetes with peripheral neuropathy. He has a history of wounds on the plantar aspect of his left first and fourth toes. He has had these for several months. He was being seen in the wound care center at [facility] in [facility]. He apparently presented with swelling and an open wound at the tip of the toe. An MRI showed osteomyelitis. He was given 6 to 8 weeks of oral doxycycline again all of this via the patient we have none of these records. Since then he has been putting Goldbond on the areas wearing regular shoes. He was seen by his primary physician on 4/9 and sent down here for our review. In the primary care note it says he has been recommended for hyperbaric oxygen. Past medical history includes type 2 diabetes with peripheral neuropathy, heart  failure with reduced ejection fraction 30 to 35%, peripheral arterial disease, coronary artery disease, hyperlipidemia, hypertension, syncope and a prior history of a right great toe amputation also by Dr. Lynden Ang in [place] ABIs in our clinic were 0.91 on the left. Also notable that in 2019 he appears to  have had arterial studies that are visible in our system. At that point the right ABI was 1.17, TBI of 0.85 with triphasic waveforms. On the left his ABI was 1.19 with a TBI of 0.73 again with triphasic waveform 5/3; we did receive some information from [facility] wound care. The patient was seen there on 1/29. At that point he had wounds on the right foot at the amputation site the fifth digit fourth digit I am presuming on the right and then an area on the left first digit although that is not specifically stated, he has had a previous amputation on the right however. It would appear him at that time most of his ulcers were on the right the left foot is stated to have a lot of scales and calluses but not a lot of open wounds. The only wounds we define when he came in here last time were on the left first and left fourth toe He also had a wound culture that showed heavy growth of staph aureus and a slight growth of Stenotrophomonas maltophilia. The doxycycline should cover the staph aureus I am not sure about the stenotrophomonas. In any case he completed 6 weeks of this. UNFORTUNATELY I still do not have a copy of the MRI. And the exact justification for hyperbarics is therefore lacking. 5/10; the patient had osteomyelitis in the left fourth toe. He was treated for 6 weeks of doxycycline this wound is healed as is the left first toe. He was sent here for hyperbaric oxygen however at this point his wounds are healed and I think a course of watchful waiting and observation is in order. If the left fourth toe reopens then it is likely he will need a more protracted and aggressive treatment  approach Readmission: 05/06/20 upon evaluation today patient presents for readmission here in the clinic exam issues with his right foot on the medial aspect at the amputation site where his great toe was this is the first metatarsal area that is affected. He has been seeing Dr. Marcha Solders who performed the surgery. With that being said currently the patient was diagnosed as having MRSA on a culture that was + March 31. Has been on IV vancomycin for 6 weeks he is now on doxycycline as the MRSA was sensitive to Doxy and he does have 2 more refills he has been on that for 3 weeks already. He has MRI of January for showed L knee first metatarsal base early osteomyelitis versus possible reactive disease but nonetheless based on what was seen it appears osteomyelitis is more likely the cause here. He has had Apligraf although to be honest that did not seem to do the job. He is also been using Dakin's solution and currently is using Aquacel. Sharp debridement has been performed by Dr. Marcha Solders on a weekly basis according to what the patient tells me. With all that being said the patient was referred to [place] for further evaluation and treatment as unfortunately he does not seem to be responding well to his treatment with the surgeon. They have considered a total contact cast it sounds like but again with the infection that this was not a good idea. The patient does have a significant past medical history for diabetes mellitus type 2 for which she is on insulin, hypertension, significant congestive heart failure with a most recent ejection fraction of 30 to 35% and that was in 2020. He has not seen his cardiologist [name] since that time. In regard to  his diabetes his A1c he does not even know he tells me his blood sugars run somewhere in the 200-300 range. With that being said he has not seen the primary care provider recently for management of this either. The patient tells me he is also a current smoker. He  tells me he is trying to stop using nicotine pouches but he just does not have enough saliva he tells me his mouth is dry all the time this is probably a subsequent issue from the diabetes as well. Unfortunately he just appears to be in a place where he is not necessarily at his healthiest even with his medical conditions. I think he would be a candidate for hyperbaric oxygen therapy but there are a lot of other things that need to be in place first before we get to that point. Electronic Signature(s) Signed: 05/06/2020 10:08:29 AM By: Lenda KelpStone [name], Svetlana Bagby PA-C Entered By: Lenda KelpStone [name], Billijo Dilling on 05/06/2020 10:08:29 -------------------------------------------------------------------------------- Physical Exam Details Patient Name: Date of Service: Jose CrawREYNO LDS [name], RA [facility] 05/06/2020 9:00 A M Medical Record Number: [id number] Patient Account Number: [id number] Date of Birth/Sex: Treating RN: April 13, 1970 (49 y.o. Jose SchoonerM) Boehlein, Linda Primary Care Provider: Lia HoppingHasanaj, Xaje Other Clinician: Referring Provider: Treating Provider/Extender: Laurann MontanaStone [name], Delando Satter Hasanaj, Xaje Weeks in Treatment: 0 Constitutional patient is hypertensive.. pulse regular and within target range for patient.Marland Kitchen. respirations regular, non-labored and within target range for patient.Marland Kitchen. temperature within target range for patient.. Well-nourished and well-hydrated in no acute distress. Eyes conjunctiva clear no eyelid edema noted. pupils equal round and reactive to light and accommodation. Ears, Nose, Mouth, and Throat no gross abnormality of ear auricles or external auditory canals. normal hearing noted during conversation. mucus membranes moist. Respiratory normal breathing without difficulty. Cardiovascular 2+ dorsalis pedis/posterior tibialis pulses. 1+ pitting edema of the bilateral lower extremities. Musculoskeletal normal gait and posture. no significant deformity or arthritic changes, no loss or range of motion, no  clubbing. Psychiatric this patient is able to make decisions and demonstrates good insight into disease process. Alert and Oriented x 3. pleasant and cooperative. Notes Upon inspection patient's wound bed actually showed signs of good granulation and some parts of the wound although he had a lot of necrotic tissue, callus, and debris noted around the edges and throughout. This is something that is going to need to be addressed in my opinion ASAP. I do think that if we can perform debridement not have to use silver nitrate this will be ideal as that will help the tissue to actually heal much more effectively and quickly. I also think he would be benefiting currently from the [facility] dressing which may help with better overall tissue quality. Electronic Signature(s) Signed: 05/06/2020 10:09:14 AM By: Lenda KelpStone [name], Toshiyuki Fredell PA-C Entered By: Lenda KelpStone [name], Soloman Mckeithan on 05/06/2020 10:09:13 -------------------------------------------------------------------------------- Physician Orders Details Patient Name: Date of Service: Jose CrawEYNO LDS [name], [facility] 9:00 A M Medical Record Number: [id number] Patient Account Number: [id number] Date of Birth/Sex: Treating RN: April 13, 1970 (49 y.o. Jose SchoonerM) Boehlein, Linda Primary Care Provider: Lia HoppingHasanaj, Xaje Other Clinician: Referring Provider: Treating Provider/Extender: Laurann MontanaStone [name], Dessie Delcarlo Hasanaj, Xaje Weeks in Treatment: 0 Verbal / Phone Orders: No Diagnosis Coding ICD-10 Coding Code Description E11.621 Type 2 diabetes mellitus with foot ulcer L97.512 Non-pressure chronic ulcer of other part of right foot with fat layer exposed E11.42 Type 2 diabetes mellitus with diabetic polyneuropathy I10 Essential (primary) hypertension I50.42 Chronic combined systolic (congestive) and diastolic (congestive) heart failure F17.218 Nicotine dependence, cigarettes, with other nicotine-induced disorders  Follow-up Appointments Return A ppointment in 1 week. Other: - pt to call and  make appointment with primary care MD to assess hemaglobin A!C and assist with smoking cessation pt to call and schedule follow up appointment with [name] to repeat ECHO pre hyperbaric oxygen therapy Bathing/ Shower/ Hygiene May shower with protection but do not get wound dressing(s) wet. Off-Loading Wedge shoe to: - right foot to ambulate Additional Orders / Instructions Stop/Decrease Smoking Follow Nutritious Diet Home Health No change in wound care orders this week; continue Home Health for wound care. May utilize formulary equivalent dressing for wound treatment orders unless otherwise specified. Other Home Health Orders/Instructions: - [facility] Wound Treatment Wound #3 - Foot Wound Laterality: Right, Medial Prim Dressing: Hydrofera Blue Classic Foam, 4x4 in 3 x Per Week/30 Days ary Discharge Instructions: Moisten with saline prior to applying to wound bed Secondary Dressing: Zetuvit Plus Silicone Border Dressing 5x5 (in/in) 3 x Per Week/30 Days Discharge Instructions: Apply silicone border over primary dressing , or may use gauze and secure with rolled gauze Electronic Signature(s) Signed: 05/06/2020 11:32:37 AM By: Lenda Kelp PA-C Signed: 05/06/2020 6:20:01 PM By: [name] RN, BSN Entered By: [name] on 05/06/2020 10:05:47 -------------------------------------------------------------------------------- Problem List Details Patient Name: Date of Service: [name], RA NDO [facility] 05/06/2020 9:00 A M Medical Record Number: [id number] Patient Account Number: [id number] Date of Birth/Sex: Treating RN: Nov 02, 1970 (49 y.o. Bayard Hugger, Bonita Quin Primary Care Provider: [name] Other Clinician: Referring Provider: Treating Provider/Extender: [name] in Treatment: 0 Active Problems ICD-10 Encounter Code Description Active Date MDM Diagnosis E11.621 Type 2 diabetes mellitus with foot ulcer 05/06/2020 No Yes L97.512 Non-pressure  chronic ulcer of other part of right foot with fat layer exposed 05/06/2020 No Yes E11.42 Type 2 diabetes mellitus with diabetic polyneuropathy 05/06/2020 No Yes I10 Essential (primary) hypertension 05/06/2020 No Yes I50.42 Chronic combined systolic (congestive) and diastolic (congestive) heart failure 05/06/2020 No Yes F17.218 Nicotine dependence, cigarettes, with other nicotine-induced disorders 05/06/2020 No Yes Inactive Problems Resolved Problems Electronic Signature(s) Signed: 05/06/2020 9:31:25 AM By: Lenda Kelp PA-C Previous Signature: 05/06/2020 9:23:28 AM Version By: Lenda Kelp PA-C Entered By: Lenda Kelp on 05/06/2020 09:31:25 -------------------------------------------------------------------------------- Progress Note/History and Physical Details Patient Name: Date of Service: [name], RA NDO [facility] 05/06/2020 9:00 A M Medical Record Number: [id number] Patient Account Number: [id number] Date of Birth/Sex: Treating RN: 06/03/1970 (49 y.o. [name] Primary Care Provider: [name] Other Clinician: Referring Provider: Treating Provider/Extender: [name] in Treatment: 0 Subjective Chief Complaint Information obtained from Patient Right foot ulcer History of Present Illness (HPI) ADMISSION 05/13/2019 This is a 50 year old man who has type 2 diabetes with peripheral neuropathy. He has a history of wounds on the plantar aspect of his left first and fourth toes. He has had these for several months. He was being seen in the wound care center at [facility] in [facility]. He apparently presented with swelling and an open wound at the tip of the toe. An MRI showed osteomyelitis. He was given 6 to 8 weeks of oral doxycycline again all of this via the patient we have none of these records. Since then he has been putting Goldbond on the areas wearing regular shoes. He was seen by his primary physician on 4/9 and sent down  here for our review. In the primary care note it says he has been recommended for hyperbaric oxygen. Past medical history  includes type 2 diabetes with peripheral neuropathy, heart failure with reduced ejection fraction 30 to 35%, peripheral arterial disease, coronary artery disease, hyperlipidemia, hypertension, syncope and a prior history of a right great toe amputation also by Dr. Lynden Ang in [place] ABIs in our clinic were 0.91 on the left. Also notable that in 2019 he appears to have had arterial studies that are visible in our system. At that point the right ABI was 1.17, TBI of 0.85 with triphasic waveforms. On the left his ABI was 1.19 with a TBI of 0.73 again with triphasic waveform 5/3; we did receive some information from [facility] wound care. The patient was seen there on 1/29. At that point he had wounds on the right foot at the amputation site the fifth digit fourth digit I am presuming on the right and then an area on the left first digit although that is not specifically stated, he has had a previous amputation on the right however. It would appear him at that time most of his ulcers were on the right the left foot is stated to have a lot of scales and calluses but not a lot of open wounds. The only wounds we define when he came in here last time were on the left first and left fourth toe He also had a wound culture that showed heavy growth of staph aureus and a slight growth of Stenotrophomonas maltophilia. The doxycycline should cover the staph aureus I am not sure about the stenotrophomonas. In any case he completed 6 weeks of this. UNFORTUNATELY I still do not have a copy of the MRI. And the exact justification for hyperbarics is therefore lacking. 5/10; the patient had osteomyelitis in the left fourth toe. He was treated for 6 weeks of doxycycline this wound is healed as is the left first toe. He was sent here for hyperbaric oxygen however at this point his wounds are healed and I  think a course of watchful waiting and observation is in order. If the left fourth toe reopens then it is likely he will need a more protracted and aggressive treatment approach Readmission: 05/06/20 upon evaluation today patient presents for readmission here in the clinic exam issues with his right foot on the medial aspect at the amputation site where his great toe was this is the first metatarsal area that is affected. He has been seeing Dr. Marcha Solders who performed the surgery. With that being said currently the patient was diagnosed as having MRSA on a culture that was + March 31. Has been on IV vancomycin for 6 weeks he is now on doxycycline as the MRSA was sensitive to Doxy and he does have 2 more refills he has been on that for 3 weeks already. He has MRI of January for showed L knee first metatarsal base early osteomyelitis versus possible reactive disease but nonetheless based on what was seen it appears osteomyelitis is more likely the cause here. He has had Apligraf although to be honest that did not seem to do the job. He is also been using Dakin's solution and currently is using Aquacel. Sharp debridement has been performed by Dr. Marcha Solders on a weekly basis according to what the patient tells me. With all that being said the patient was referred to [place] for further evaluation and treatment as unfortunately he does not seem to be responding well to his treatment with the surgeon. They have considered a total contact cast it sounds like but again with the infection that this was  not a good idea. The patient does have a significant past medical history for diabetes mellitus type 2 for which she is on insulin, hypertension, significant congestive heart failure with a most recent ejection fraction of 30 to 35% and that was in 2020. He has not seen his cardiologist [name] since that time. In regard to his diabetes his A1c he does not even know he tells me his blood sugars run somewhere in the  200-300 range. With that being said he has not seen the primary care provider recently for management of this either. The patient tells me he is also a current smoker. He tells me he is trying to stop using nicotine pouches but he just does not have enough saliva he tells me his mouth is dry all the time this is probably a subsequent issue from the diabetes as well. Unfortunately he just appears to be in a place where he is not necessarily at his healthiest even with his medical conditions. I think he would be a candidate for hyperbaric oxygen therapy but there are a lot of other things that need to be in place first before we get to that point. Patient History Information obtained from Patient. Allergies No Known Allergies Family History Cancer, Hypertension - Father, Lung Disease - Paternal Grandparents, No family history of Diabetes, Heart Disease, Hereditary Spherocytosis, Kidney Disease, Seizures, Stroke, Thyroid Problems, Tuberculosis. Social History Current every day smoker - 1 PPD, Marital Status - Separated, Alcohol Use - Rarely, Drug Use - No History, Caffeine Use - Daily - Coffee. Medical History Eyes Denies history of Cataracts, Glaucoma, Optic Neuritis Ear/Nose/Mouth/Throat Denies history of Chronic sinus problems/congestion, Middle ear problems Hematologic/Lymphatic Denies history of Anemia, Hemophilia, Human Immunodeficiency Virus, Lymphedema, Sickle Cell Disease Respiratory Patient has history of Sleep Apnea Denies history of Aspiration, Asthma, Chronic Obstructive Pulmonary Disease (COPD), Pneumothorax, Tuberculosis Cardiovascular Patient has history of Congestive Heart Failure, Hypertension Denies history of Angina, Arrhythmia, Coronary Artery Disease, Deep Vein Thrombosis, Hypotension, Myocardial Infarction, Peripheral Arterial Disease, Peripheral Venous Disease, Phlebitis, Vasculitis Gastrointestinal Denies history of Cirrhosis , Colitis, Crohnoos, Hepatitis A,  Hepatitis B, Hepatitis C Endocrine Patient has history of Type II Diabetes Denies history of Type I Diabetes Genitourinary Denies history of End Stage Renal Disease Immunological Denies history of Lupus Erythematosus, Raynaudoos, Scleroderma Integumentary (Skin) Denies history of History of Burn Musculoskeletal Denies history of Gout, Rheumatoid Arthritis, Osteoarthritis, Osteomyelitis Neurologic Patient has history of Neuropathy Denies history of Dementia, Quadriplegia, Paraplegia, Seizure Disorder Oncologic Denies history of Received Chemotherapy, Received Radiation Psychiatric Denies history of Anorexia/bulimia, Confinement Anxiety Patient is treated with Insulin, Oral Agents. Blood sugar is not tested. Review of Systems (ROS) Constitutional Symptoms (General Health) Denies complaints or symptoms of Fatigue, Fever, Chills, Marked Weight Change. Eyes Denies complaints or symptoms of Dry Eyes, Vision Changes, Glasses / Contacts. Gastrointestinal Denies complaints or symptoms of Frequent diarrhea, Nausea, Vomiting. Integumentary (Skin) Complains or has symptoms of Wounds - wound right foot. Musculoskeletal Denies complaints or symptoms of Muscle Pain, Muscle Weakness. Psychiatric Denies complaints or symptoms of Claustrophobia, Suicidal. Objective Constitutional patient is hypertensive.. pulse regular and within target range for patient.Marland Kitchen respirations regular, non-labored and within target range for patient.Marland Kitchen temperature within target range for patient.. Well-nourished and well-hydrated in no acute distress. Vitals Time Taken: 8:44 AM, Height: 72 in, Source: Stated, Weight: 304 lbs, Source: Stated, BMI: 41.2, Temperature: 98.0 F, Pulse: 90 bpm, Respiratory Rate: 18 breaths/min, Blood Pressure: 145/93 mmHg. Eyes conjunctiva clear no eyelid edema noted. pupils equal  round and reactive to light and accommodation. Ears, Nose, Mouth, and Throat no gross abnormality of ear  auricles or external auditory canals. normal hearing noted during conversation. mucus membranes moist. Respiratory normal breathing without difficulty. Cardiovascular 2+ dorsalis pedis/posterior tibialis pulses. 1+ pitting edema of the bilateral lower extremities. Musculoskeletal normal gait and posture. no significant deformity or arthritic changes, no loss or range of motion, no clubbing. Psychiatric this patient is able to make decisions and demonstrates good insight into disease process. Alert and Oriented x 3. pleasant and cooperative. General Notes: Upon inspection patient's wound bed actually showed signs of good granulation and some parts of the wound although he had a lot of necrotic tissue, callus, and debris noted around the edges and throughout. This is something that is going to need to be addressed in my opinion ASAP. I do think that if we can perform debridement not have to use silver nitrate this will be ideal as that will help the tissue to actually heal much more effectively and quickly. I also think he would be benefiting currently from the [facility] dressing which may help with better overall tissue quality. Integumentary (Hair, Skin) Wound #3 status is Open. Original cause of wound was Gradually Appeared. The date acquired was: 07/18/2019. The wound is located on the Right,Medial Foot. The wound measures 2.3cm length x 1.8cm width x 0.8cm depth; 3.252cm^2 area and 2.601cm^3 volume. There is Fat Layer (Subcutaneous Tissue) exposed. There is no tunneling noted, however, there is undermining starting at 6:00 and ending at 12:00 with a maximum distance of 0.4cm. There is a medium amount of serosanguineous drainage noted. The wound margin is thickened. There is large (67-100%) red, pink granulation within the wound bed. There is a small (1- 33%) amount of necrotic tissue within the wound bed including Eschar and Adherent Slough. Assessment Active Problems ICD-10 Type 2  diabetes mellitus with foot ulcer Non-pressure chronic ulcer of other part of right foot with fat layer exposed Type 2 diabetes mellitus with diabetic polyneuropathy Essential (primary) hypertension Chronic combined systolic (congestive) and diastolic (congestive) heart failure Nicotine dependence, cigarettes, with other nicotine-induced disorders Procedures Wound #3 Pre-procedure diagnosis of Wound #3 is a Diabetic Wound/Ulcer of the Lower Extremity located on the Right,Medial Foot .Severity of Tissue Pre Debridement is: Bone involvement without necrosis. There was a Excisional Skin/Subcutaneous Tissue Debridement with a total area of 8.75 sq cm performed by Lenda Kelp, PA. With the following instrument(s): Curette to remove Viable and Non-Viable tissue/material. Material removed includes Callus, Subcutaneous Tissue, Slough, and Skin: Epidermis after achieving pain control using Lidocaine 4% Topical Solution. No specimens were taken. A time out was conducted at 09:45, prior to the start of the procedure. A Minimum amount of bleeding was controlled with Pressure. The procedure was tolerated well with a pain level of 0 throughout and a pain level of 0 following the procedure. Post Debridement Measurements: 2cm length x 0.6cm width x 0.5cm depth; 0.471cm^3 volume. Character of Wound/Ulcer Post Debridement is improved. Severity of Tissue Post Debridement is: Bone involvement without necrosis. Post procedure Diagnosis Wound #3: Same as Pre-Procedure Plan Follow-up Appointments: Return Appointment in 1 week. Other: - pt to call and make appointment with primary care MD to assess hemaglobin A!C and assist with smoking cessation pt to call and schedule follow up appointment with [name] to repeat ECHO pre hyperbaric oxygen therapy Bathing/ Shower/ Hygiene: May shower with protection but do not get wound dressing(s) wet. Off-Loading: Wedge shoe to: - right  foot to ambulate Additional Orders  / Instructions: Stop/Decrease Smoking Follow Nutritious Diet Home Health: No change in wound care orders this week; continue Home Health for wound care. May utilize formulary equivalent dressing for wound treatment orders unless otherwise specified. Other Home Health Orders/Instructions: - [facility] WOUND #3: - Foot Wound Laterality: Right, Medial Prim Dressing: Hydrofera Blue Classic Foam, 4x4 in 3 x Per Week/30 Days ary Discharge Instructions: Moisten with saline prior to applying to wound bed Secondary Dressing: Zetuvit Plus Silicone Border Dressing 5x5 (in/in) 3 x Per Week/30 Days Discharge Instructions: Apply silicone border over primary dressing , or may use gauze and secure with rolled gauze 1. Recommend currently that the patient initiate treatment with [facility] with a bordered foam to cover. I think this may be a better treatment option for him currently. 2. I am also can recommend at this time that he continue with the doxycycline as there does appear to be evidence of infection erythema still noted. 3. With regard to his diabetes I think he needs to see his primary care provider currently as well in order to see about getting this under control as well as obtaining an updated A1c to see where we stand in this regard. 4. Also think that the patient needs to see [name] his cardiologist as soon as possible for repeat echocardiogram and likely EKG in order to see what his ejection fraction is at this point. Last check he was somewhere around 30 to 35% which is quite low. 5. With regard to smoking I did have a conversation with him for somewhere between 5 to 10 minutes today to discuss the fact that he really does need to do something about the smoking to get this under control. I think this is something he should also discuss with his primary care provider when he sees him in order to find a better way to manage this hopefully. I think that oxygen deprivation in regard to  wound healing is a significant issue and is something that needs to be addressed sooner rather than later. 6. If all of this above is addressed and sent in order I do think the patient would be a candidate for hyperbaric oxygen therapy to try to prevent a below-knee amputation. With that being said if he does not get these other major medical issues under control I think he is at high risk for amputation. We will see patient back for reevaluation in 2 weeks here in the clinic. If anything worsens or changes patient will contact our office for additional recommendations. Electronic Signature(s) Signed: 05/06/2020 10:11:16 AM By: Lenda Kelp PA-C Entered By: Lenda Kelp on 05/06/2020 10:11:16 -------------------------------------------------------------------------------- HxROS Details Patient Name: Date of Service: [name], RA NDO [facility] 05/06/2020 9:00 A M Medical Record Number: [id number] Patient Account Number: [id number] Date of Birth/Sex: Treating RN: 1970-08-30 (49 y.o. Elizebeth Koller Primary Care Provider: [name] Other Clinician: Referring Provider: Treating Provider/Extender: [name] in Treatment: 0 Label Progress Note Print Version as History and Physical for this encounter Information Obtained From Patient Constitutional Symptoms (General Health) Complaints and Symptoms: Negative for: Fatigue; Fever; Chills; Marked Weight Change Eyes Complaints and Symptoms: Negative for: Dry Eyes; Vision Changes; Glasses / Contacts Medical History: Negative for: Cataracts; Glaucoma; Optic Neuritis Gastrointestinal Complaints and Symptoms: Negative for: Frequent diarrhea; Nausea; Vomiting Medical History: Negative for: Cirrhosis ; Colitis; Crohns; Hepatitis A; Hepatitis B; Hepatitis C Integumentary (Skin) Complaints and Symptoms: Positive for: Wounds -  wound right foot Medical History: Negative for: History of  Burn Musculoskeletal Complaints and Symptoms: Negative for: Muscle Pain; Muscle Weakness Medical History: Negative for: Gout; Rheumatoid Arthritis; Osteoarthritis; Osteomyelitis Psychiatric Complaints and Symptoms: Negative for: Claustrophobia; Suicidal Medical History: Negative for: Anorexia/bulimia; Confinement Anxiety Ear/Nose/Mouth/Throat Medical History: Negative for: Chronic sinus problems/congestion; Middle ear problems Hematologic/Lymphatic Medical History: Negative for: Anemia; Hemophilia; Human Immunodeficiency Virus; Lymphedema; Sickle Cell Disease Respiratory Medical History: Positive for: Sleep Apnea Negative for: Aspiration; Asthma; Chronic Obstructive Pulmonary Disease (COPD); Pneumothorax; Tuberculosis Cardiovascular Medical History: Positive for: Congestive Heart Failure; Hypertension Negative for: Angina; Arrhythmia; Coronary Artery Disease; Deep Vein Thrombosis; Hypotension; Myocardial Infarction; Peripheral Arterial Disease; Peripheral Venous Disease; Phlebitis; Vasculitis Endocrine Medical History: Positive for: Type II Diabetes Negative for: Type I Diabetes Time with diabetes: 13 years Treated with: Insulin, Oral agents Blood sugar tested every day: No Genitourinary Medical History: Negative for: End Stage Renal Disease Immunological Medical History: Negative for: Lupus Erythematosus; Raynauds; Scleroderma Neurologic Medical History: Positive for: Neuropathy Negative for: Dementia; Quadriplegia; Paraplegia; Seizure Disorder Oncologic Medical History: Negative for: Received Chemotherapy; Received Radiation Immunizations Pneumococcal Vaccine: Received Pneumococcal Vaccination: No Implantable Devices None Family and Social History Cancer: Yes; Diabetes: No; Heart Disease: No; Hereditary Spherocytosis: No; Hypertension: Yes - Father; Kidney Disease: No; Lung Disease: Yes - Paternal Grandparents; Seizures: No; Stroke: No; Thyroid Problems: No;  Tuberculosis: No; Current every day smoker - 1 PPD; Marital Status - Separated; Alcohol Use: Rarely; Drug Use: No History; Caffeine Use: Daily - Coffee; Financial Concerns: No; Food, Clothing or Shelter Needs: No; Support System Lacking: No; Transportation Concerns: No Electronic Signature(s) Signed: 05/06/2020 11:32:37 AM By: Lenda Kelp PA-C Signed: 05/07/2020 5:27:18 PM By: Zandra Abts RN, BSN Entered By: Zandra Abts on 05/06/2020 09:02:14 -------------------------------------------------------------------------------- SuperBill Details Patient Name: Date of Service: [name], RA NDO [facility] 05/06/2020 Medical Record Number: [id number] Patient Account Number: [id number] Date of Birth/Sex: Treating RN: Jul 03, 1970 (49 y.o. [name] Primary Care Provider: [name] Other Clinician: Referring Provider: Treating Provider/Extender: [name] in Treatment: 0 Diagnosis Coding ICD-10 Codes Code Description [id number] Type 2 diabetes mellitus with foot ulcer L97.512 Non-pressure chronic ulcer of other part of right foot with fat layer exposed E11.42 Type 2 diabetes mellitus with diabetic polyneuropathy I10 Essential (primary) hypertension I50.42 Chronic combined systolic (congestive) and diastolic (congestive) heart failure F17.218 Nicotine dependence, cigarettes, with other nicotine-induced disorders Facility Procedures CPT4 Code: 21308657 Description: 11042 - DEB SUBQ TISSUE 20 SQ CM/< ICD-10 Diagnosis Description L97.512 Non-pressure chronic ulcer of other part of right foot with fat layer exposed Modifier: Quantity: 1 Physician Procedures : CPT4 Code Description Modifier [id number] - WC PHYS LEVEL 4 - EST PT 25 ICD-10 Diagnosis Description E11.621 Type 2 diabetes mellitus with foot ulcer L97.512 Non-pressure chronic ulcer of other part of right foot with fat layer exposed E11.42 Type 2  diabetes mellitus with diabetic polyneuropathy I10  Essential (primary) hypertension Quantity: 1 : [id number] 11042 - WC PHYS SUBQ TISS 20 SQ CM ICD-10 Diagnosis Description L97.512 Non-pressure chronic ulcer of other part of right foot with fat layer exposed Quantity: 1 Electronic Signature(s) Signed: 05/06/2020 10:11:36 AM By: Lenda Kelp PA-C Entered By: Lenda Kelp on 05/06/2020 10:11:36

## 2020-05-07 NOTE — Progress Notes (Signed)
MONTRAE, BRAITHWAITE ([id number]) Visit Report for 05/06/2020 Abuse/Suicide Risk Screen Details Patient Name: Date of Service: [name], [name] [facility] 05/06/2020 9:00 A M Medical Record Number: [id number] Patient Account Number: [id number] Date of Birth/Sex: Treating RN: 1970-04-29 (49 y.o. Elizebeth Koller Primary Care Nomar Broad: [name] Other Clinician: Referring Derrion Tritz: Treating Lauris Serviss/Extender: [name] in Treatment: 0 Abuse/Suicide Risk Screen Items Answer ABUSE RISK SCREEN: Has anyone close to you tried to hurt or harm you recentlyo No Do you feel uncomfortable with anyone in your familyo No Has anyone forced you do things that you didnt want to doo No Electronic Signature(s) Signed: 05/07/2020 5:27:18 PM By: Zandra Abts RN, BSN Entered By: Zandra Abts on 05/06/2020 09:02:20 -------------------------------------------------------------------------------- Activities of Daily Living Details Patient Name: Date of Service: [name], [name] [facility] 05/06/2020 9:00 A M Medical Record Number: [id number] Patient Account Number: [id number] Date of Birth/Sex: Treating RN: 06-15-1970 (49 y.o. Elizebeth Koller Primary Care Tyrina Hines: [name] Other Clinician: Referring Vegas Coffin: Treating Shamicka Inga/Extender: [name] in Treatment: 0 Activities of Daily Living Items Answer Activities of Daily Living (Please select one for each item) Drive Automobile Completely Able T Medications ake Completely Able Use T elephone Completely Able Care for Appearance Completely Able Use T oilet Completely Able Bath / Shower Completely Able Dress Self Completely Able Feed Self Completely Able Walk Completely Able Get In / Out Bed Completely Able Housework Completely Able Prepare Meals Completely Able Handle Money Completely Able Shop for Self Completely Able Electronic Signature(s) Signed: 05/07/2020 5:27:18 PM By: Zandra Abts RN, BSN Entered By: Zandra Abts on 05/06/2020 09:02:41 -------------------------------------------------------------------------------- Education Screening Details Patient Name: Date of Service: [name], [name] [facility] 05/06/2020 9:00 A M Medical Record Number: [id number] Patient Account Number: [id number] Date of Birth/Sex: Treating RN: 04-29-1970 (49 y.o. Elizebeth Koller Primary Care Addilyn Satterwhite: [name] Other Clinician: Referring Dariana Garbett: Treating Redford Behrle/Extender: [name] in Treatment: 0 Primary Learner Assessed: Patient Learning Preferences/Education Level/Primary Language Learning Preference: Explanation, Demonstration, Printed Material Highest Education Level: High School Preferred Language: English Cognitive Barrier Language Barrier: No Translator Needed: No Memory Deficit: No Emotional Barrier: No Cultural/Religious Beliefs Affecting Medical Care: No Physical Barrier Impaired Vision: No Impaired Hearing: No Decreased Hand dexterity: No Knowledge/Comprehension Knowledge Level: High Comprehension Level: High Ability to understand written instructions: High Ability to understand verbal instructions: High Motivation Anxiety Level: Calm Cooperation: Cooperative Education Importance: Acknowledges Need Interest in Health Problems: Asks Questions Perception: Coherent Willingness to Engage in Self-Management High Activities: Readiness to Engage in Self-Management High Activities: Electronic Signature(s) Signed: 05/07/2020 5:27:18 PM By: Zandra Abts RN, BSN Entered By: Zandra Abts on 05/06/2020 09:03:05 -------------------------------------------------------------------------------- Fall Risk Assessment Details Patient Name: Date of Service: [name], [name] [facility] 05/06/2020 9:00 A M Medical Record Number: [id number] Patient Account Number: [id number] Date of Birth/Sex: Treating RN: [birth year] (49 y.o. Elizebeth Koller Primary Care Siren Porrata: [name] Other Clinician: Referring Zenovia Justman: Treating Racquel Arkin/Extender: [name] in Treatment: 0 Fall Risk Assessment Items Have you had 2 or more falls in the last 12 monthso 0 Yes Have you had any fall that resulted in injury in the last 12 monthso 0 No FALLS RISK SCREEN History of falling - immediate or within 3 months 25 Yes Secondary diagnosis (Do you have 2 or more medical diagnoseso) 0 No Ambulatory aid None/bed rest/wheelchair/nurse 0 No Crutches/cane/walker 15 Yes Furniture 0 No Intravenous  therapy Access/Saline/Heparin Lock 0 No Gait/Transferring Normal/ bed rest/ wheelchair 0 Yes Weak (short steps with or without shuffle, stooped but able to lift head while walking, may seek 0 No support from furniture) Impaired (short steps with shuffle, may have difficulty arising from chair, head down, impaired 0 No balance) Mental Status Oriented to own ability 0 Yes Electronic Signature(s) Signed: 05/07/2020 5:27:18 PM By: Zandra Abts RN, BSN Entered By: Zandra Abts on 05/06/2020 09:03:25 -------------------------------------------------------------------------------- Foot Assessment Details Patient Name: Date of Service: [name], [name] [facility] 05/06/2020 9:00 A M Medical Record Number: [id number] Patient Account Number: [id number] Date of Birth/Sex: Treating RN: October 28, 1970 (49 y.o. Elizebeth Koller Primary Care Carle Fenech: [name] Other Clinician: Referring Dakari Stabler: Treating Nevaeh Korte/Extender: [name] in Treatment: 0 Foot Assessment Items Site Locations + = Sensation present, - = Sensation absent, C = Callus, U = Ulcer R = Redness, W = Warmth, M = Maceration, PU = Pre-ulcerative lesion F = Fissure, S = Swelling, D = Dryness Assessment Right: Left: Other Deformity: No No Prior Foot Ulcer: No No Prior Amputation: No No Charcot Joint: No No Ambulatory  Status: Ambulatory With Help Assistance Device: Cane Gait: Steady Electronic Signature(s) Signed: 05/07/2020 5:27:18 PM By: Zandra Abts RN, BSN Entered By: Zandra Abts on 05/06/2020 09:06:28 -------------------------------------------------------------------------------- Nutrition Risk Screening Details Patient Name: Date of Service: [name], [name] [facility] 05/06/2020 9:00 A M Medical Record Number: [id number] Patient Account Number: [id number] Date of Birth/Sex: Treating RN: 23-Apr-1970 (49 y.o. Elizebeth Koller Primary Care Imberly Troxler: [name] Other Clinician: Referring Dossie Swor: Treating Ellieanna Funderburg/Extender: [name] in Treatment: 0 Height (in): 72 Weight (lbs): 304 Body Mass Index (BMI): 41.2 Nutrition Risk Screening Items Score Screening NUTRITION RISK SCREEN: I have an illness or condition that made me change the kind and/or amount of food I eat 2 Yes I eat fewer than two meals per day 0 No I eat few fruits and vegetables, or milk products 0 No I have three or more drinks of beer, liquor or wine almost every day 0 No I have tooth or mouth problems that make it hard for me to eat 0 No I don't always have enough money to buy the food I need 0 No I eat alone most of the time 0 No I take three or more different prescribed or over-the-counter drugs a day 1 Yes Without wanting to, I have lost or gained 10 pounds in the last six months 0 No I am not always physically able to shop, cook and/or feed myself 0 No Nutrition Protocols Good Risk Protocol Moderate Risk Protocol 0 Provide education on nutrition High Risk Proctocol Risk Level: Moderate Risk Score: 3 Electronic Signature(s) Signed: 05/07/2020 5:27:18 PM By: Zandra Abts RN, BSN Entered By: Zandra Abts on 05/06/2020 09:03:33

## 2020-05-08 DIAGNOSIS — M869 Osteomyelitis, unspecified: Secondary | ICD-10-CM | POA: Diagnosis not present

## 2020-05-08 DIAGNOSIS — E1169 Type 2 diabetes mellitus with other specified complication: Secondary | ICD-10-CM | POA: Diagnosis not present

## 2020-05-08 DIAGNOSIS — B9562 Methicillin resistant Staphylococcus aureus infection as the cause of diseases classified elsewhere: Secondary | ICD-10-CM | POA: Diagnosis not present

## 2020-05-08 DIAGNOSIS — E11621 Type 2 diabetes mellitus with foot ulcer: Secondary | ICD-10-CM | POA: Diagnosis not present

## 2020-05-08 DIAGNOSIS — L97412 Non-pressure chronic ulcer of right heel and midfoot with fat layer exposed: Secondary | ICD-10-CM | POA: Diagnosis not present

## 2020-05-08 DIAGNOSIS — B965 Pseudomonas (aeruginosa) (mallei) (pseudomallei) as the cause of diseases classified elsewhere: Secondary | ICD-10-CM | POA: Diagnosis not present

## 2020-05-11 DIAGNOSIS — E1169 Type 2 diabetes mellitus with other specified complication: Secondary | ICD-10-CM | POA: Diagnosis not present

## 2020-05-11 DIAGNOSIS — M868X7 Other osteomyelitis, ankle and foot: Secondary | ICD-10-CM | POA: Diagnosis not present

## 2020-05-11 DIAGNOSIS — I10 Essential (primary) hypertension: Secondary | ICD-10-CM | POA: Diagnosis not present

## 2020-05-11 DIAGNOSIS — M869 Osteomyelitis, unspecified: Secondary | ICD-10-CM | POA: Diagnosis not present

## 2020-05-11 DIAGNOSIS — B965 Pseudomonas (aeruginosa) (mallei) (pseudomallei) as the cause of diseases classified elsewhere: Secondary | ICD-10-CM | POA: Diagnosis not present

## 2020-05-11 DIAGNOSIS — E1142 Type 2 diabetes mellitus with diabetic polyneuropathy: Secondary | ICD-10-CM | POA: Diagnosis not present

## 2020-05-11 DIAGNOSIS — L97412 Non-pressure chronic ulcer of right heel and midfoot with fat layer exposed: Secondary | ICD-10-CM | POA: Diagnosis not present

## 2020-05-11 DIAGNOSIS — E11621 Type 2 diabetes mellitus with foot ulcer: Secondary | ICD-10-CM | POA: Diagnosis not present

## 2020-05-11 DIAGNOSIS — B9562 Methicillin resistant Staphylococcus aureus infection as the cause of diseases classified elsewhere: Secondary | ICD-10-CM | POA: Diagnosis not present

## 2020-05-11 DIAGNOSIS — E7849 Other hyperlipidemia: Secondary | ICD-10-CM | POA: Diagnosis not present

## 2020-05-11 DIAGNOSIS — I5022 Chronic systolic (congestive) heart failure: Secondary | ICD-10-CM | POA: Diagnosis not present

## 2020-05-13 ENCOUNTER — Other Ambulatory Visit: Payer: Self-pay

## 2020-05-13 ENCOUNTER — Encounter (HOSPITAL_BASED_OUTPATIENT_CLINIC_OR_DEPARTMENT_OTHER): Payer: Medicare Other | Admitting: Internal Medicine

## 2020-05-13 DIAGNOSIS — I110 Hypertensive heart disease with heart failure: Secondary | ICD-10-CM | POA: Diagnosis not present

## 2020-05-13 DIAGNOSIS — E1151 Type 2 diabetes mellitus with diabetic peripheral angiopathy without gangrene: Secondary | ICD-10-CM | POA: Diagnosis not present

## 2020-05-13 DIAGNOSIS — E11621 Type 2 diabetes mellitus with foot ulcer: Secondary | ICD-10-CM

## 2020-05-13 DIAGNOSIS — I5042 Chronic combined systolic (congestive) and diastolic (congestive) heart failure: Secondary | ICD-10-CM | POA: Diagnosis not present

## 2020-05-13 DIAGNOSIS — E1142 Type 2 diabetes mellitus with diabetic polyneuropathy: Secondary | ICD-10-CM | POA: Diagnosis not present

## 2020-05-13 DIAGNOSIS — F17218 Nicotine dependence, cigarettes, with other nicotine-induced disorders: Secondary | ICD-10-CM | POA: Diagnosis not present

## 2020-05-13 DIAGNOSIS — L97529 Non-pressure chronic ulcer of other part of left foot with unspecified severity: Secondary | ICD-10-CM | POA: Diagnosis not present

## 2020-05-13 DIAGNOSIS — Z794 Long term (current) use of insulin: Secondary | ICD-10-CM | POA: Diagnosis not present

## 2020-05-13 DIAGNOSIS — L97512 Non-pressure chronic ulcer of other part of right foot with fat layer exposed: Secondary | ICD-10-CM | POA: Diagnosis not present

## 2020-05-13 DIAGNOSIS — Z8249 Family history of ischemic heart disease and other diseases of the circulatory system: Secondary | ICD-10-CM | POA: Diagnosis not present

## 2020-05-13 NOTE — Progress Notes (Signed)
[name], Jose ([id number]) Visit Report for 05/13/2020 Chief Complaint Document Details Patient Name: Date of Service: Colon FlatteryREYNO LDS [name], RA [facility] 05[facility]13/2020 1:30 PM Medical Record Number: [id number] Patient Account Number: [id number] Date of Birth/Sex: Treating RN: 01/12/1971 (50 y.o. [name]) Boehlein, Linda Primary Care Provider: Lia HoppingHasanaj, Xaje Other Clinician: Referring Provider: Treating Provider/Extender: Harold BarbanHoffman, Shepherd Finnan Hasanaj, Xaje Weeks in Treatment: 1 Information Obtained from: Patient Chief Complaint Right foot ulcer Electronic Signature(s) Signed: 05/13/2020 2:25:37 PM By: Geralyn CorwinHoffman, Mohd Clemons DO Entered By: Geralyn CorwinHoffman, Minnie Legros on 05/13/2020 14:15:20 -------------------------------------------------------------------------------- Debridement Details Patient Name: Date of Service: Jose CrawEYNO LDS [name], RA [facility] 05[facility]13/2020 1:30 PM Medical Record Number: [id number] Patient Account Number: [id number] Date of Birth/Sex: Treating RN: 01/12/1971 (50 y.o. [name]) Boehlein, Linda Primary Care Provider: Lia HoppingHasanaj, Xaje Other Clinician: Referring Provider: Treating Provider/Extender: Harold BarbanHoffman, Adhya Cocco Hasanaj, Xaje Weeks in Treatment: 1 Debridement Performed for Assessment: Wound #3 Right,Medial Foot Performed By: Physician Geralyn CorwinHoffman, Jamesina Gaugh, DO Debridement Type: Debridement Severity of Tissue Pre Debridement: Fat layer exposed Level of Consciousness (Pre-procedure): Awake and Alert Pre-procedure Verification/Time Out Yes - 14:05 Taken: Start Time: 14:05 Pain Control: Other : benzocaine 20% spray T Area Debrided (L x W): otal 3 (cm) x 2.5 (cm) = 7.5 (cm) Tissue and other material debrided: Viable, Non-Viable, Callus, Slough, Subcutaneous, Skin: Epidermis, Slough Level: Skin/Subcutaneous Tissue Debridement Description: Excisional Instrument: Blade Bleeding: Minimum Hemostasis Achieved: Pressure End Time: 14:12 Procedural Pain: 0 Post Procedural Pain: 0 Response to Treatment: Procedure was  tolerated well Level of Consciousness (Post- Awake and Alert procedure): Post Debridement Measurements of Total Wound Length: (cm) 1.5 Width: (cm) 0.8 Depth: (cm) 0.7 Volume: (cm) 0.66 Character of Wound/Ulcer Post Debridement: Requires Further Debridement Severity of Tissue Post Debridement: Fat layer exposed Post Procedure Diagnosis Same as Pre-procedure Electronic Signature(s) Signed: 05/13/2020 2:25:37 PM By: Geralyn CorwinHoffman, Allexus Ovens DO Signed: 05/13/2020 5:51:39 PM By: Zenaida DeedBoehlein, Linda RN, BSN Entered By: Zenaida DeedBoehlein, Linda on 05/13/2020 14:12:49 -------------------------------------------------------------------------------- HPI Details Patient Name: Date of Service: Jose CrawEYNO LDS [name], RA [facility] 05/13/2020 1:30 PM Medical Record Number: [id number] Patient Account Number: [id number] Date of Birth/Sex: Treating RN: 01/12/1971 (50 y.o. [name]) Boehlein, Linda Primary Care Provider: Lia HoppingHasanaj, Xaje Other Clinician: Referring Provider: Treating Provider/Extender: Harold BarbanHoffman, Fatim Vanderschaaf Hasanaj, Xaje Weeks in Treatment: 1 History of Present Illness HPI Description: ADMISSION 05/13/2019 This is a 50 year old man who has type 2 diabetes with peripheral neuropathy. He has a history of wounds on the plantar aspect of his left first and fourth toes. He has had these for several months. He was being [facility] [place]. He apparently presented with swelling and an open wound at the tip of the toe. An MRI showed osteomyelitis. He was given 6 to 8 weeks of oral doxycycline again all of this via the patient we have none of these records. Since then he has been putting Goldbond on the areas wearing regular shoes. He was seen by his primary physician on 4/9 and sent down here for our review. In the primary care note it says he has been recommended for hyperbaric oxygen. Past medical history includes type 2 diabetes with peripheral neuropathy, heart failure with reduced  ejection fraction 30 to 35%, peripheral arterial disease, coronary artery disease, hyperlipidemia, hypertension, syncope and a prior history of a right great toe am[place] in our clinic were 0.91 on the left. Also notable that in 2019 he appears to have had arterial studies that are visible in our system.  At that point the right ABI was 1.17, TBI of 0.85 with triphasic waveforms. On the left his ABI was 1.19 with a TBI of 0.73 again with triphasic waveform 5/3; we did receive some information from [facility] wound care. The patient was seen there on 1/29. At that point he had wounds on the right foot at the amputation site the fifth digit fourth digit I am presuming on the right and then an area on the left first digit although that is not specifically stated, he has had a previous amputation on the right however. It would appear him at that time most of his ulcers were on the right the left foot is stated to have a lot of scales and calluses but not a lot of open wounds. The only wounds we define when he came in here last time were on the left first and left fourth toe He also had a wound culture that showed heavy growth of staph aureus and a slight growth of Stenotrophomonas maltophilia. The doxycycline should cover the staph aureus I am not sure about the stenotrophomonas. In any case he completed 6 weeks of this. UNFORTUNATELY I still do not have a copy of the MRI. And the exact justification for hyperbarics is therefore lacking. 5/10; the patient had osteomyelitis in the left fourth toe. He was treated for 6 weeks of doxycycline this wound is healed as is the left first toe. He was sent here for hyperbaric oxygen however at this point his wounds are healed and I think a course of watchful waiting and observation is in order. If the left fourth toe reopens then it is likely he will need a more protracted and aggressive treatment approach Readmission: 05/06/20  upon evaluation today patient presents for readmission here in the clinic exam issues with his right foot on the medial aspect at the amputation site where his great toe was this is the first metatarsal area that is affected. He has been seeing Dr. Marcha Solders who performed the surgery. With that being said currently the patient was diagnosed as having MRSA on a culture that was + March 31. Has been on IV vancomycin for 6 weeks he is now on doxycycline as the MRSA was sensitive to Doxy and he does have 2 more refills he has been on that for 3 weeks already. He has MRI of January for showed L knee first metatarsal base early osteomyelitis versus possible reactive disease but nonetheless based on what was seen it appears osteomyelitis is more likely the cause here. He has had Apligraf although to be honest that did not seem to do the job. He is also been using Dakin's solution and currently is using Aquacel. Sharp debridement has been performed by Dr. Marcha Solders on a weekly basis according to what the patient tells me. With all that being said the patient was referred to [place] for further evaluation and treatment as unfortunately he does not seem to be responding well to his treatment with the surgeon. They have considered a total contact cast it sounds like but again with the infection that this was not a good idea. The patient does have a significant past medical history for diabetes mellitus type 2 for which she is on insulin, hypertension, significant congestive heart failure with a most recent ejection fraction of 30 to 35% and that was in 2020. He has not seen his cardiologist [name] since that time. In regard to his diabetes his A1c he does not even know he  tells me his blood sugars run somewhere in the 200-300 range. With that being said he has not seen the primary care provider recently for management of this either. The patient tells me he is also a current smoker. He tells me he is trying to stop using  nicotine pouches but he just does not have enough saliva he tells me his mouth is dry all the time this is probably a subsequent issue from the diabetes as well. Unfortunately he just appears to be in a place where he is not necessarily at his healthiest even with his medical conditions. I think he would be a candidate for hyperbaric oxygen therapy but there are a lot of other things that need to be in place first before we get to that point. 05/13/2020 patient presents for 1 week follow-up. He has been using Hydrofera Blue every other day without any issues. He had to reschedule his cardiologist appointment due to transportation Issues. He has no complaints today. Electronic Signature(s) Signed: 05/13/2020 2:25:37 PM By: Geralyn Corwin DO Entered By: Geralyn Corwin on 05/13/2020 14:16:40 -------------------------------------------------------------------------------- Physical Exam Details Patient Name: Date of Service: [name], RA NDO [facility] 05/13/2020 1:30 PM Medical Record Number: [id number] Patient Account Number: [id number] Date of Birth/Sex: Treating RN: [birth year] (49 y.o. [name] Primary Care Provider: [name] Other Clinician: Referring Provider: Treating Provider/Extender: Edd Arbour Weeks in Treatment: 1 Constitutional respirations regular, non-labored and within target range for patient.. Cardiovascular 2+ dorsalis pedis/posterior tibialis pulses. Psychiatric pleasant and cooperative. Notes Right foot: On the medial side there is an open wound with necrotic debris and callus circumferentially. There is serosanguineous drainage. There are no signs of infection. Electronic Signature(s) Signed: 05/13/2020 2:25:37 PM By: Geralyn Corwin DO Entered By: Geralyn Corwin on 05/13/2020 14:18:43 -------------------------------------------------------------------------------- Physician Orders Details Patient Name: Date of Service: Jose Craw  [name], RA NDO [facility] 05/13/2020 1:30 PM Medical Record Number: [id number] Patient Account Number: [id number] Date of Birth/Sex: Treating RN: [birth year] (49 y.o. [name] Primary Care Provider: [name] Other Clinician: Referring Provider: Treating Provider/Extender: Harold Barban in Treatment: 1 Verbal / Phone Orders: No Diagnosis Coding ICD-10 Coding Code Description E11.621 Type 2 diabetes mellitus with foot ulcer L97.512 Non-pressure chronic ulcer of other part of right foot with fat layer exposed E11.42 Type 2 diabetes mellitus with diabetic polyneuropathy I10 Essential (primary) hypertension I50.42 Chronic combined systolic (congestive) and diastolic (congestive) heart failure F17.218 Nicotine dependence, cigarettes, with other nicotine-induced disorders Follow-up Appointments ppointment in 1 week. - with Leonard Schwartz Return A Other: - pt to call and make appointment with primary care MD to assess hemaglobin A!C and assist with smoking cessation pt to call and schedule follow up appointment with [name] to repeat ECHO pre hyperbaric oxygen therapy Bathing/ Shower/ Hygiene May shower with protection but do not get wound dressing(s) wet. Off-Loading Wedge shoe to: - right foot to ambulate Additional Orders / Instructions Stop/Decrease Smoking Follow Nutritious Diet Home Health No change in wound care orders this week; continue Home Health for wound care. May utilize formulary equivalent dressing for wound treatment orders unless otherwise specified. Other Home Health Orders/Instructions: - [facility]ound [facility] Wound #3 - Foot Wound Laterality: Right, Medial Prim Dressing: Hydrofera Blue Classic Foam, 4x4 in 3 x Per Week/30 Days ary Discharge Instructions: Moisten with saline prior to applying to wound, be sure to tuck into wound bedbed Secondary Dressing: Zetuvit Plus Silicone Border Dressing 5x5 (in/in) 3 x Per Week/30 Days Discharge  Instructions: Apply silicone border over primary dressing , or may use gauze and secure with rolled gauze Electronic Signature(s) Signed: 05/13/2020 2:25:37 PM By: Geralyn Corwin DO Entered By: Geralyn Corwin on 05/13/2020 14:18:58 -------------------------------------------------------------------------------- Problem List Details Patient Name: Date of Service: [name], RA NDO [facility] 05/13/2020 1:30 PM Medical Record Number: [id number] Patient Account Number: [id number] Date of Birth/Sex: Treating RN: 20-Oct-1970 (49 y.o. Bayard Hugger, Bonita Quin Primary Care Provider: [name] Other Clinician: Referring Provider: Treating Provider/Extender: Harold Barban in Treatment: 1 Active Problems ICD-10 Encounter Code Description Active Date MDM Diagnosis E11.621 Type 2 diabetes mellitus with foot ulcer 05/06/2020 No Yes L97.512 Non-pressure chronic ulcer of other part of right foot with fat layer exposed 05/06/2020 No Yes E11.42 Type 2 diabetes mellitus with diabetic polyneuropathy 05/06/2020 No Yes I10 Essential (primary) hypertension 05/06/2020 No Yes I50.42 Chronic combined systolic (congestive) and diastolic (congestive) heart failure 05/06/2020 No Yes F17.218 Nicotine dependence, cigarettes, with other nicotine-induced disorders 05/06/2020 No Yes Inactive Problems Resolved Problems Electronic Signature(s) Signed: 05/13/2020 2:25:37 PM By: Geralyn Corwin DO Entered By: Geralyn Corwin on 05/13/2020 14:15:04 -------------------------------------------------------------------------------- Progress Note Details Patient Name: Date of Service: [name], RA NDO [facility] 05/13/2020 1:30 PM Medical Record Number: [id number] Patient Account Number: [id number] Date of Birth/Sex: Treating RN: [birth year] (49 y.o. [name] Primary Care Provider: [name] Other Clinician: Referring Provider: Treating Provider/Extender: Harold Barban  in Treatment: 1 Subjective Chief Complaint Information obtained from Patient Right foot ulcer History of Present Illness (HPI) ADMISSION 05/13/2019 This is a 50 year old man who has type 2 diabetes with peripheral neuropathy. He has a history of wounds on the plantar aspect of his left first and fourth toes. He has had these for several months. He was being seen in the wound care center at [facility] in [facility]. He apparently presented with swelling and an open wound at the tip of the toe. An MRI showed osteomyelitis. He was given 6 to 8 weeks of oral doxycycline again all of this via the patient we have none of these records. Since then he has been putting Goldbond on the areas wearing regular shoes. He was seen by his primary physician on 4/9 and sent down here for our review. In the primary care note it says he has been recommended for hyperbaric oxygen. Past medical history includes type 2 diabetes with peripheral neuropathy, heart failure with reduced ejection fraction 30 to 35%, peripheral arterial disease, coronary artery disease, hyperlipidemia, hypertension, syncope and a prior history of a right great toe amputation also by Dr. Lynden Ang in [place] ABIs in our clinic were 0.91 on the left. Also notable that in 2019 he appears to have had arterial studies that are visible in our system. At that point the right ABI was 1.17, TBI of 0.85 with triphasic waveforms. On the left his ABI was 1.19 with a TBI of 0.73 again with triphasic waveform 5/3; we did receive some information from [facility] wound care. The patient was seen there on 1/29. At that point he had wounds on the right foot at the amputation site the fifth digit fourth digit I am presuming on the right and then an area on the left first digit although that is not specifically stated, he has had a previous amputation on the right however. It would appear him at that time most of his ulcers were on the right the left  foot is stated to have a lot of scales and  calluses but not a lot of open wounds. The only wounds we define when he came in here last time were on the left first and left fourth toe He also had a wound culture that showed heavy growth of staph aureus and a slight growth of Stenotrophomonas maltophilia. The doxycycline should cover the staph aureus I am not sure about the stenotrophomonas. In any case he completed 6 weeks of this. UNFORTUNATELY I still do not have a copy of the MRI. And the exact justification for hyperbarics is therefore lacking. 5/10; the patient had osteomyelitis in the left fourth toe. He was treated for 6 weeks of doxycycline this wound is healed as is the left first toe. He was sent here for hyperbaric oxygen however at this point his wounds are healed and I think a course of watchful waiting and observation is in order. If the left fourth toe reopens then it is likely he will need a more protracted and aggressive treatment approach Readmission: 05/06/20 upon evaluation today patient presents for readmission here in the clinic exam issues with his right foot on the medial aspect at the amputation site where his great toe was this is the first metatarsal area that is affected. He has been seeing Dr. Marcha Solders who performed the surgery. With that being said currently the patient was diagnosed as having MRSA on a culture that was + March 31. Has been on IV vancomycin for 6 weeks he is now on doxycycline as the MRSA was sensitive to Doxy and he does have 2 more refills he has been on that for 3 weeks already. He has MRI of January for showed L knee first metatarsal base early osteomyelitis versus possible reactive disease but nonetheless based on what was seen it appears osteomyelitis is more likely the cause here. He has had Apligraf although to be honest that did not seem to do the job. He is also been using Dakin's solution and currently is using Aquacel. Sharp debridement has been  performed by Dr. Marcha Solders on a weekly basis according to what the patient tells me. With all that being said the patient was referred to [place] for further evaluation and treatment as unfortunately he does not seem to be responding well to his treatment with the surgeon. They have considered a total contact cast it sounds like but again with the infection that this was not a good idea. The patient does have a significant past medical history for diabetes mellitus type 2 for which she is on insulin, hypertension, significant congestive heart failure with a most recent ejection fraction of 30 to 35% and that was in 2020. He has not seen his cardiologist [name] since that time. In regard to his diabetes his A1c he does not even know he tells me his blood sugars run somewhere in the 200-300 range. With that being said he has not seen the primary care provider recently for management of this either. The patient tells me he is also a current smoker. He tells me he is trying to stop using nicotine pouches but he just does not have enough saliva he tells me his mouth is dry all the time this is probably a subsequent issue from the diabetes as well. Unfortunately he just appears to be in a place where he is not necessarily at his healthiest even with his medical conditions. I think he would be a candidate for hyperbaric oxygen therapy but there are a lot of other things that need to be in  place first before we get to that point. 05/13/2020 patient presents for 1 week follow-up. He has been using Hydrofera Blue every other day without any issues. He had to reschedule his cardiologist appointment due to transportation Issues. He has no complaints today. Patient History Information obtained from Patient. Family History Cancer, Hypertension - Father, Lung Disease - Paternal Grandparents, No family history of Diabetes, Heart Disease, Hereditary Spherocytosis, Kidney Disease, Seizures, Stroke, Thyroid Problems,  Tuberculosis. Social History Current every day smoker - 1 PPD, Marital Status - Separated, Alcohol Use - Rarely, Drug Use - No History, Caffeine Use - Daily - Coffee. Medical History Eyes Denies history of Cataracts, Glaucoma, Optic Neuritis Ear/Nose/Mouth/Throat Denies history of Chronic sinus problems/congestion, Middle ear problems Hematologic/Lymphatic Denies history of Anemia, Hemophilia, Human Immunodeficiency Virus, Lymphedema, Sickle Cell Disease Respiratory Patient has history of Sleep Apnea Denies history of Aspiration, Asthma, Chronic Obstructive Pulmonary Disease (COPD), Pneumothorax, Tuberculosis Cardiovascular Patient has history of Congestive Heart Failure, Hypertension Denies history of Angina, Arrhythmia, Coronary Artery Disease, Deep Vein Thrombosis, Hypotension, Myocardial Infarction, Peripheral Arterial Disease, Peripheral Venous Disease, Phlebitis, Vasculitis Gastrointestinal Denies history of Cirrhosis , Colitis, Crohnoos, Hepatitis A, Hepatitis B, Hepatitis C Endocrine Patient has history of Type II Diabetes Denies history of Type I Diabetes Genitourinary Denies history of End Stage Renal Disease Immunological Denies history of Lupus Erythematosus, Raynaudoos, Scleroderma Integumentary (Skin) Denies history of History of Burn Musculoskeletal Denies history of Gout, Rheumatoid Arthritis, Osteoarthritis, Osteomyelitis Neurologic Patient has history of Neuropathy Denies history of Dementia, Quadriplegia, Paraplegia, Seizure Disorder Oncologic Denies history of Received Chemotherapy, Received Radiation Psychiatric Denies history of Anorexia/bulimia, Confinement Anxiety Patient is treated with Insulin, Oral Agents. Blood sugar is not tested. Objective Constitutional respirations regular, non-labored and within target range for patient.. Vitals Time Taken: 1:11 PM, Height: 72 in, Weight: 304 lbs, BMI: 41.2, Temperature: 98.6 F, Pulse: 83 bpm, Respiratory  Rate: 18 breaths/min, Blood Pressure: 139/82 mmHg, Capillary Blood Glucose: 233 mg/dl. General Notes: glucose per pt report Cardiovascular 2+ dorsalis pedis/posterior tibialis pulses. Psychiatric pleasant and cooperative. General Notes: Right foot: On the medial side there is an open wound with necrotic debris and callus circumferentially. There is serosanguineous drainage. There are no signs of infection. Integumentary (Hair, Skin) Wound #3 status is Open. Original cause of wound was Gradually Appeared. The date acquired was: 07/18/2019. The wound has been in treatment 1 weeks. The wound is located on the Right,Medial Foot. The wound measures 1.5cm length x 0.8cm width x 0.7cm depth; 0.942cm^2 area and 0.66cm^3 volume. There is Fat Layer (Subcutaneous Tissue) exposed. There is no tunneling noted, however, there is undermining starting at 5:00 and ending at 6:00 with a maximum distance of 0.4cm. There is a medium amount of serosanguineous drainage noted. The wound margin is thickened. There is large (67-100%) red, pink granulation within the wound bed. There is a small (1-33%) amount of necrotic tissue within the wound bed including Adherent Slough. Assessment Active Problems ICD-10 Type 2 diabetes mellitus with foot ulcer Non-pressure chronic ulcer of other part of right foot with fat layer exposed Type 2 diabetes mellitus with diabetic polyneuropathy Essential (primary) hypertension Chronic combined systolic (congestive) and diastolic (congestive) heart failure Nicotine dependence, cigarettes, with other nicotine-induced disorders Patient presents for follow-up today. The overall appearance and size has improved since last clinic visit 1 week ago. He still has a lot of callus and necrotic debris that was debrided today. He has been using Hydrofera Blue every other day. I would like to continue with this.  There are no signs of infection today. And he is still taking doxycycline for a culture  positive for MRSA at the end of March. He is currently working on obtaining the requirements needed for HBO. He will follow-up in 1 week with [facility] Procedures Wound #3 Pre-procedure diagnosis of Wound #3 is a Diabetic Wound/Ulcer of the Lower Extremity located on the Right,Medial Foot .Severity of Tissue Pre Debridement is: Fat layer exposed. There was a Excisional Skin/Subcutaneous Tissue Debridement with a total area of 7.5 sq cm performed by Geralyn Corwin, DO. With the following instrument(s): Blade to remove Viable and Non-Viable tissue/material. Material removed includes Callus, Subcutaneous Tissue, Slough, and Skin: Epidermis after achieving pain control using Other (benzocaine 20% spray). No specimens were taken. A time out was conducted at 14:05, prior to the start of the procedure. A Minimum amount of bleeding was controlled with Pressure. The procedure was tolerated well with a pain level of 0 throughout and a pain level of 0 following the procedure. Post Debridement Measurements: 1.5cm length x 0.8cm width x 0.7cm depth; 0.66cm^3 volume. Character of Wound/Ulcer Post Debridement requires further debridement. Severity of Tissue Post Debridement is: Fat layer exposed. Post procedure Diagnosis Wound #3: Same as Pre-Procedure Plan Follow-up Appointments: Return Appointment in 1 week. - with Leonard Schwartz Other: - pt to call and make appointment with primary care MD to assess hemaglobin A!C and assist with smoking cessation pt to call and schedule follow up appointment with [name] to repeat ECHO pre hyperbaric oxygen therapy Bathing/ Shower/ Hygiene: May shower with protection but do not get wound dressing(s) wet. Off-Loading: Wedge shoe to: - right foot to ambulate Additional Orders / Instructions: Stop/Decrease Smoking Follow Nutritious Diet Home Health: No change in wound care orders this week; continue Home Health for wound care. May utilize formulary equivalent dressing for wound  treatment orders unless otherwise specified. Other Home Health Orders/Instructions: - [facility] WOUND #3: - Foot Wound Laterality: Right, Medial Prim Dressing: Hydrofera Blue Classic Foam, 4x4 in 3 x Per Week/30 Days ary Discharge Instructions: Moisten with saline prior to applying to wound, be sure to tuck into wound bedbed Secondary Dressing: Zetuvit Plus Silicone Border Dressing 5x5 (in/in) 3 x Per Week/30 Days Discharge Instructions: Apply silicone border over primary dressing , or may use gauze and secure with rolled gauze 1. Hydrofera Blue every other day 2. Aggressive offloading 3. Follow-up in 1 week Electronic Signature(s) Signed: 05/13/2020 2:25:37 PM By: Geralyn Corwin DO Entered By: Geralyn Corwin on 05/13/2020 14:24:08 -------------------------------------------------------------------------------- HxROS Details Patient Name: Date of Service: [name], RA NDO [facility] 05/13/2020 1:30 PM Medical Record Number: [id number] Patient Account Number: [id number] Date of Birth/Sex: Treating RN: [birth year] (49 y.o. [name] Primary Care Provider: [name] Other Clinician: Referring Provider: Treating Provider/Extender: Harold Barban in Treatment: 1 Label Progress Note Print Version as History and Physical for this encounter Information Obtained From Patient Eyes Medical History: Negative for: Cataracts; Glaucoma; Optic Neuritis Ear/Nose/Mouth/Throat Medical History: Negative for: Chronic sinus problems/congestion; Middle ear problems Hematologic/Lymphatic Medical History: Negative for: Anemia; Hemophilia; Human Immunodeficiency Virus; Lymphedema; Sickle Cell Disease Respiratory Medical History: Positive for: Sleep Apnea Negative for: Aspiration; Asthma; Chronic Obstructive Pulmonary Disease (COPD); Pneumothorax; Tuberculosis Cardiovascular Medical History: Positive for: Congestive Heart Failure; Hypertension Negative for: Angina;  Arrhythmia; Coronary Artery Disease; Deep Vein Thrombosis; Hypotension; Myocardial Infarction; Peripheral Arterial Disease; Peripheral Venous Disease; Phlebitis; Vasculitis Gastrointestinal Medical History: Negative for: Cirrhosis ; Colitis; Crohns; Hepatitis A; Hepatitis B; Hepatitis C Endocrine Medical  History: Positive for: Type II Diabetes Negative for: Type I Diabetes Time with diabetes: 13 years Treated with: Insulin, Oral agents Blood sugar tested every day: No Genitourinary Medical History: Negative for: End Stage Renal Disease Immunological Medical History: Negative for: Lupus Erythematosus; Raynauds; Scleroderma Integumentary (Skin) Medical History: Negative for: History of Burn Musculoskeletal Medical History: Negative for: Gout; Rheumatoid Arthritis; Osteoarthritis; Osteomyelitis Neurologic Medical History: Positive for: Neuropathy Negative for: Dementia; Quadriplegia; Paraplegia; Seizure Disorder Oncologic Medical History: Negative for: Received Chemotherapy; Received Radiation Psychiatric Medical History: Negative for: Anorexia/bulimia; Confinement Anxiety Immunizations Pneumococcal Vaccine: Received Pneumococcal Vaccination: No Implantable Devices None Family and Social History Cancer: Yes; Diabetes: No; Heart Disease: No; Hereditary Spherocytosis: No; Hypertension: Yes - Father; Kidney Disease: No; Lung Disease: Yes - Paternal Grandparents; Seizures: No; Stroke: No; Thyroid Problems: No; Tuberculosis: No; Current every day smoker - 1 PPD; Marital Status - Separated; Alcohol Use: Rarely; Drug Use: No History; Caffeine Use: Daily - Coffee; Financial Concerns: No; Food, Clothing or Shelter Needs: No; Support System Lacking: No; Transportation Concerns: No Electronic Signature(s) Signed: 05/13/2020 2:25:37 PM By: Geralyn Corwin DO Signed: 05/13/2020 5:51:39 PM By: [name] RN, BSN Entered By: Geralyn Corwin on 05/13/2020  14:16:55 -------------------------------------------------------------------------------- SuperBill Details Patient Name: Date of Service: [name], RA NDO [facility] 05/13/2020 Medical Record Number: [id number] Patient Account Number: [id number] Date of Birth/Sex: Treating RN: 07/29/1970 (49 y.o. [name] Primary Care Provider: [name] Other Clinician: Referring Provider: Treating Provider/Extender: Harold Barban in Treatment: 1 Diagnosis Coding ICD-10 Codes Code Description [phone] Type 2 diabetes mellitus with foot ulcer L97.512 Non-pressure chronic ulcer of other part of right foot with fat layer exposed E11.42 Type 2 diabetes mellitus with diabetic polyneuropathy I10 Essential (primary) hypertension I50.42 Chronic combined systolic (congestive) and diastolic (congestive) heart failure F17.218 Nicotine dependence, cigarettes, with other nicotine-induced disorders Facility Procedures CPT4 Code: [id number] Description: 11042 - DEB SUBQ TISSUE 20 SQ CM/< ICD-10 Diagnosis Description L97.512 Non-pressure chronic ulcer of other part of right foot with fat layer exposed Modifier: Quantity: 1 Electronic Signature(s) Signed: 05/13/2020 2:25:37 PM By: Geralyn Corwin DO Entered By: Geralyn Corwin on 05/13/2020 14:25:13

## 2020-05-14 ENCOUNTER — Encounter: Payer: Self-pay | Admitting: *Deleted

## 2020-05-15 ENCOUNTER — Ambulatory Visit: Payer: Medicare Other | Admitting: Cardiology

## 2020-05-15 DIAGNOSIS — E1169 Type 2 diabetes mellitus with other specified complication: Secondary | ICD-10-CM | POA: Diagnosis not present

## 2020-05-15 DIAGNOSIS — B965 Pseudomonas (aeruginosa) (mallei) (pseudomallei) as the cause of diseases classified elsewhere: Secondary | ICD-10-CM | POA: Diagnosis not present

## 2020-05-15 DIAGNOSIS — B9562 Methicillin resistant Staphylococcus aureus infection as the cause of diseases classified elsewhere: Secondary | ICD-10-CM | POA: Diagnosis not present

## 2020-05-15 DIAGNOSIS — M869 Osteomyelitis, unspecified: Secondary | ICD-10-CM | POA: Diagnosis not present

## 2020-05-15 DIAGNOSIS — L97412 Non-pressure chronic ulcer of right heel and midfoot with fat layer exposed: Secondary | ICD-10-CM | POA: Diagnosis not present

## 2020-05-15 DIAGNOSIS — E11621 Type 2 diabetes mellitus with foot ulcer: Secondary | ICD-10-CM | POA: Diagnosis not present

## 2020-05-18 DIAGNOSIS — M869 Osteomyelitis, unspecified: Secondary | ICD-10-CM | POA: Diagnosis not present

## 2020-05-18 DIAGNOSIS — L97412 Non-pressure chronic ulcer of right heel and midfoot with fat layer exposed: Secondary | ICD-10-CM | POA: Diagnosis not present

## 2020-05-18 DIAGNOSIS — E1169 Type 2 diabetes mellitus with other specified complication: Secondary | ICD-10-CM | POA: Diagnosis not present

## 2020-05-18 DIAGNOSIS — B9562 Methicillin resistant Staphylococcus aureus infection as the cause of diseases classified elsewhere: Secondary | ICD-10-CM | POA: Diagnosis not present

## 2020-05-18 DIAGNOSIS — B965 Pseudomonas (aeruginosa) (mallei) (pseudomallei) as the cause of diseases classified elsewhere: Secondary | ICD-10-CM | POA: Diagnosis not present

## 2020-05-18 DIAGNOSIS — E11621 Type 2 diabetes mellitus with foot ulcer: Secondary | ICD-10-CM | POA: Diagnosis not present

## 2020-05-18 NOTE — Progress Notes (Signed)
EDD, REPPERT ([id number]) Visit Report for 05/13/2020 Arrival Information Details Patient Name: Date of Service: [name], RA NDO [facility] 05/13/2020 1:30 PM Medical Record Number: [id number] Patient Account Number: [id number] Date of Birth/Sex: Treating RN: 12/06/1970 (49 y.o. Elizebeth Koller Primary Care Lyndell Allaire: [name] Other Clinician: Referring Jamita Mckelvin: Treating Debraann Livingstone/Extender: Harold Barban in Treatment: 1 Visit Information History Since Last Visit Added or deleted any medications: No Patient Arrived: Cane Any new allergies or adverse reactions: No Arrival Time: 13:07 Had a fall or experienced change in No Accompanied By: alone activities of daily living that may affect Transfer Assistance: None risk of falls: Patient Identification Verified: Yes Signs or symptoms of abuse/neglect since last visito No Secondary Verification Process Completed: Yes Hospitalized since last visit: No Patient Requires Transmission-Based Precautions: No Implantable device outside of the clinic excluding No Patient Has Alerts: No cellular tissue based products placed in the center since last visit: Has Dressing in Place as Prescribed: Yes Pain Present Now: No Electronic Signature(s) Signed: 05/18/2020 5:39:21 PM By: Zandra Abts RN, BSN Entered By: Zandra Abts on 05/13/2020 13:11:30 -------------------------------------------------------------------------------- Encounter Discharge Information Details Patient Name: Date of Service: [name], RA NDO [facility] 05/13/2020 1:30 PM Medical Record Number: [id number] Patient Account Number: [id number] Date of Birth/Sex: Treating RN: April 21, 1970 (49 y.o. Lytle Michaels Primary Care Shawnelle Spoerl: [name] Other Clinician: Referring Tsugio Elison: Treating Bubber Rothert/Extender: Harold Barban in Treatment: 1 Encounter Discharge Information Items Post Procedure Vitals Discharge Condition:  Stable Temperature (F): 98.6 Ambulatory Status: Cane Pulse (bpm): 83 Discharge Destination: Home Respiratory Rate (breaths/min): 18 Transportation: Other Blood Pressure (mmHg): 139/82 Schedule Follow-up Appointment: Yes Clinical Summary of Care: Provided on 05/13/2020 Form Type Recipient Paper Patient Patient Electronic Signature(s) Signed: 05/13/2020 2:27:14 PM By: Antonieta Iba Entered By: Antonieta Iba on 05/13/2020 14:27:14 -------------------------------------------------------------------------------- Lower Extremity Assessment Details Patient Name: Date of Service: [name], RA NDO [facility] 05/13/2020 1:30 PM Medical Record Number: [id number] Patient Account Number: [id number] Date of Birth/Sex: Treating RN: [birth year] (49 y.o. Elizebeth Koller Primary Care Kayshaun Polanco: [name] Other Clinician: Referring Katisha Shimizu: Treating Ashtan Girtman/Extender: Edd Arbour Weeks in Treatment: 1 Edema Assessment Assessed: [Left: No] Franne Forts: No] Edema: [Left: N] [Right: o] Calf Left: Right: Point of Measurement: 34 cm From Medial Instep 43 cm Ankle Left: Right: Point of Measurement: 13 cm From Medial Instep 25.5 cm Vascular Assessment Pulses: Dorsalis Pedis Palpable: [Right:Yes] Electronic Signature(s) Signed: 05/18/2020 5:39:21 PM By: Zandra Abts RN, BSN Entered By: Zandra Abts on 05/13/2020 13:33:43 -------------------------------------------------------------------------------- Multi Wound Chart Details Patient Name: Date of Service: [name], RA NDO [facility] 05/13/2020 1:30 PM Medical Record Number: [id number] Patient Account Number: [id number] Date of Birth/Sex: Treating RN: Jul 26, 1970 (49 y.o. [name] Primary Care Kaleyah Labreck: [name] Other Clinician: Referring Britney Newstrom: Treating Audriana Aldama/Extender: Harold Barban in Treatment: 1 Vital Signs Height(in): 72 Capillary Blood Glucose(mg/dl): 283 Weight(lbs):  151 Pulse(bpm): 83 Body Mass Index(BMI): 41 Blood Pressure(mmHg): 139/82 Temperature(F): 98.6 Respiratory Rate(breaths/min): 18 Photos: [3:No Photos Right, Medial Foot] [N/A:N/A N/A] Wound Location: [3:Gradually Appeared] [N/A:N/A] Wounding Event: [3:Diabetic Wound/Ulcer of the Lower] [N/A:N/A] Primary Etiology: [3:Extremity Sleep Apnea, Congestive Heart] [N/A:N/A] Comorbid History: [3:Failure, Hypertension, Type II Diabetes, Neuropathy 07/18/2019] [N/A:N/A] Date Acquired: [3:1] [N/A:N/A] Weeks of Treatment: [3:Open] [N/A:N/A] Wound Status: [3:1.5x0.8x0.7] [N/A:N/A] Measurements L x W x D (cm) [3:0.942] [N/A:N/A] A (cm) : rea [3:0.66] [N/A:N/A] Volume (cm) : [3:71.00%] [N/A:N/A] % Reduction in A rea: [3:74.60%] [N/A:N/A] % Reduction in Volume: [  3:5] Starting Position 1 (o'clock): [3:6] Ending Position 1 (o'clock): [3:0.4] Maximum Distance 1 (cm): [3:Yes] [N/A:N/A] Undermining: [3:Grade 3] [N/A:N/A] Classification: [3:Medium] [N/A:N/A] Exudate A mount: [3:Serosanguineous] [N/A:N/A] Exudate Type: [3:red, brown] [N/A:N/A] Exudate Color: [3:Thickened] [N/A:N/A] Wound Margin: [3:Large (67-100%)] [N/A:N/A] Granulation A mount: [3:Red, Pink] [N/A:N/A] Granulation Quality: [3:Small (1-33%)] [N/A:N/A] Necrotic A mount: [3:Fat Layer (Subcutaneous Tissue): Yes N/A] Exposed Structures: [3:Fascia: No Tendon: No Muscle: No Joint: No Bone: No None] [N/A:N/A] Epithelialization: [3:Debridement - Excisional] [N/A:N/A] Debridement: Pre-procedure Verification/Time Out 14:05 [N/A:N/A] Taken: [3:Other] [N/A:N/A] Pain Control: [3:Callus, Subcutaneous, Slough] [N/A:N/A] Tissue Debrided: [3:Skin/Subcutaneous Tissue] [N/A:N/A] Level: [3:7.5] [N/A:N/A] Debridement A (sq cm): [3:rea Blade] [N/A:N/A] Instrument: [3:Minimum] [N/A:N/A] Bleeding: [3:Pressure] [N/A:N/A] Hemostasis A chieved: [3:0] [N/A:N/A] Procedural Pain: [3:0] [N/A:N/A] Post Procedural Pain: [3:Procedure was tolerated well]  [N/A:N/A] Debridement Treatment Response: [3:1.5x0.8x0.7] [N/A:N/A] Post Debridement Measurements L x W x D (cm) [3:0.66] [N/A:N/A] Post Debridement Volume: (cm) [3:Debridement] [N/A:N/A] Treatment Notes Electronic Signature(s) Signed: 05/13/2020 2:25:37 PM By: Geralyn Corwin DO Signed: 05/13/2020 5:51:39 PM By: [name] RN, BSN Entered By: Geralyn Corwin on 05/13/2020 14:15:13 -------------------------------------------------------------------------------- Multi-Disciplinary Care Plan Details Patient Name: Date of Service: [name], RA NDO [facility] 05/13/2020 1:30 PM Medical Record Number: [id number] Patient Account Number: [id number] Date of Birth/Sex: Treating RN: 12-Sep-1970 (49 y.o. [name] Primary Care Kylin Genna: [name] Other Clinician: Referring Veneda Kirksey: Treating Reannah Totten/Extender: Harold Barban in Treatment: 1 Multidisciplinary Care Plan reviewed with physician Active Inactive Nutrition Nursing Diagnoses: Impaired glucose control: actual or potential Potential for alteratiion in Nutrition/Potential for imbalanced nutrition Goals: Patient/caregiver will maintain therapeutic glucose control Date Initiated: 05/13/2020 Target Resolution Date: 06/10/2020 Goal Status: Active Interventions: Assess patient nutrition upon admission and as needed per policy Treatment Activities: Patient referred to Primary Care Physician for further nutritional evaluation : 05/13/2020 Notes: Osteomyelitis Nursing Diagnoses: Infection: osteomyelitis Knowledge deficit related to disease process and management Goals: Patient's osteomyelitis will resolve Date Initiated: 05/13/2020 Target Resolution Date: 06/10/2020 Goal Status: Active Interventions: Assess for signs and symptoms of osteomyelitis resolution every visit Provide education on osteomyelitis Treatment Activities: Systemic antibiotics : 05/13/2020 T ordered outside of clinic :  05/13/2020 est Notes: Wound/Skin Impairment Nursing Diagnoses: Impaired tissue integrity Knowledge deficit related to ulceration/compromised skin integrity Goals: Patient/caregiver will verbalize understanding of skin care regimen Date Initiated: 05/13/2020 Target Resolution Date: 06/10/2020 Goal Status: Active Ulcer/skin breakdown will have a volume reduction of 30% by week 4 Date Initiated: 05/13/2020 Target Resolution Date: 06/03/2020 Goal Status: Active Interventions: Assess patient/caregiver ability to obtain necessary supplies Assess patient/caregiver ability to perform ulcer/skin care regimen upon admission and as needed Assess ulceration(s) every visit Provide education on ulcer and skin care Treatment Activities: Skin care regimen initiated : 05/13/2020 Topical wound management initiated : 05/13/2020 Notes: Electronic Signature(s) Signed: 05/13/2020 5:51:39 PM By: [name] RN, BSN Entered By: [name] on 05/13/2020 13:51:58 -------------------------------------------------------------------------------- Pain Assessment Details Patient Name: Date of Service: [name], RA NDO [facility] 05/13/2020 1:30 PM Medical Record Number: [id number] Patient Account Number: [id number] Date of Birth/Sex: Treating RN: Jun 10, 1970 (49 y.o. Elizebeth Koller Primary Care Reinhold Rickey: [name] Other Clinician: Referring Deontray Hunnicutt: Treating Jream Broyles/Extender: Harold Barban in Treatment: 1 Active Problems Location of Pain Severity and Description of Pain Patient Has Paino No Site Locations Pain Management and Medication Current Pain Management: Electronic Signature(s) Signed: 05/18/2020 5:39:21 PM By: Zandra Abts RN, BSN Entered By: Zandra Abts on 05/13/2020 13:14:26 -------------------------------------------------------------------------------- Patient/Caregiver Education Details Patient Name: Date of Service: [name], RA  NDO [facility]  4/27/2022andnbsp1:30 PM Medical Record Number: [id number] Patient Account Number: [id number] Date of Birth/Gender: Treating RN: 1970-10-28 (49 y.o. [name] Primary Care Physician: [name] Other Clinician: Referring Physician: Treating Physician/Extender: Harold Barban in Treatment: 1 Education Assessment Education Provided To: Patient Education Topics Provided Infection: Methods: Explain/Verbal Responses: Reinforcements needed, State content correctly Offloading: Methods: Explain/Verbal Responses: Reinforcements needed, State content correctly Wound/Skin Impairment: Methods: Explain/Verbal Responses: Reinforcements needed, State content correctly Electronic Signature(s) Signed: 05/13/2020 5:51:39 PM By: [name] RN, BSN Entered By: [name] on 05/13/2020 13:52:32 -------------------------------------------------------------------------------- Wound Assessment Details Patient Name: Date of Service: [name], RA NDO [facility] 05/13/2020 1:30 PM Medical Record Number: [id number] Patient Account Number: [id number] Date of Birth/Sex: Treating RN: [birth year] (49 y.o. Elizebeth Koller Primary Care Sierria Bruney: [name] Other Clinician: Referring Avalie Oconnor: Treating Adileny Delon/Extender: Edd Arbour Weeks in Treatment: 1 Wound Status Wound Number: 3 Primary Diabetic Wound/Ulcer of the Lower Extremity Etiology: Wound Location: Right, Medial Foot Wound Open Wounding Event: Gradually Appeared Status: Date Acquired: 07/18/2019 Comorbid Sleep Apnea, Congestive Heart Failure, Hypertension, Type II Weeks Of Treatment: 1 History: Diabetes, Neuropathy Clustered Wound: No Photos Wound Measurements Length: (cm) 1.5 Width: (cm) 0.8 Depth: (cm) 0.7 Area: (cm) 0.942 Volume: (cm) 0.66 % Reduction in Area: 71% % Reduction in Volume: 74.6% Epithelialization: None Tunneling: No Undermining: Yes Starting Position  (o'clock): 5 Ending Position (o'clock): 6 Maximum Distance: (cm) 0.4 Wound Description Classification: Grade 3 Wound Margin: Thickened Exudate Amount: Medium Exudate Type: Serosanguineous Exudate Color: red, brown Foul Odor After Cleansing: No Slough/Fibrino Yes Wound Bed Granulation Amount: Large (67-100%) Exposed Structure Granulation Quality: Red, Pink Fascia Exposed: No Necrotic Amount: Small (1-33%) Fat Layer (Subcutaneous Tissue) Exposed: Yes Necrotic Quality: Adherent Slough Tendon Exposed: No Muscle Exposed: No Joint Exposed: No Bone Exposed: No Treatment Notes Wound #3 (Foot) Wound Laterality: Right, Medial Cleanser Peri-Wound Care Topical Primary Dressing Hydrofera Blue Classic Foam, 4x4 in Discharge Instruction: Moisten with saline prior to applying to wound, be sure to tuck into wound bedbed Secondary Dressing Zetuvit Plus Silicone Border Dressing 5x5 (in/in) Discharge Instruction: Apply silicone border over primary dressing , or may use gauze and secure with rolled gauze Secured With Compression Wrap Compression Stockings Add-Ons Electronic Signature(s) Signed: 05/13/2020 4:46:04 PM By: Karl Ito Signed: 05/18/2020 5:39:21 PM By: Zandra Abts RN, BSN Entered By: Karl Ito on 05/13/2020 16:37:40 -------------------------------------------------------------------------------- Vitals Details Patient Name: Date of Service: [name], RA NDO [facility] 05/13/2020 1:30 PM Medical Record Number: [id number] Patient Account Number: [id number] Date of Birth/Sex: Treating RN: 12-14-1970 (49 y.o. Elizebeth Koller Primary Care Jakory Matsuo: [name] Other Clinician: Referring Terita Hejl: Treating Mccade Sullenberger/Extender: Harold Barban in Treatment: 1 Vital Signs Time Taken: 13:11 Temperature (F): 98.6 Height (in): 72 Pulse (bpm): 83 Weight (lbs): 304 Respiratory Rate (breaths/min): 18 Body Mass Index (BMI): 41.2 Blood Pressure  (mmHg): 139/82 Capillary Blood Glucose (mg/dl): 734 Reference Range: 80 - 120 mg / dl Notes glucose per pt report Electronic Signature(s) Signed: 05/18/2020 5:39:21 PM By: Zandra Abts RN, BSN Entered By: Zandra Abts on 05/13/2020 13:14:21

## 2020-05-20 ENCOUNTER — Encounter (HOSPITAL_BASED_OUTPATIENT_CLINIC_OR_DEPARTMENT_OTHER): Payer: Medicare Other | Attending: Physician Assistant | Admitting: Physician Assistant

## 2020-05-20 ENCOUNTER — Other Ambulatory Visit: Payer: Self-pay

## 2020-05-20 DIAGNOSIS — G4730 Sleep apnea, unspecified: Secondary | ICD-10-CM | POA: Insufficient documentation

## 2020-05-20 DIAGNOSIS — Z8249 Family history of ischemic heart disease and other diseases of the circulatory system: Secondary | ICD-10-CM | POA: Insufficient documentation

## 2020-05-20 DIAGNOSIS — E11621 Type 2 diabetes mellitus with foot ulcer: Secondary | ICD-10-CM | POA: Insufficient documentation

## 2020-05-20 DIAGNOSIS — E785 Hyperlipidemia, unspecified: Secondary | ICD-10-CM | POA: Insufficient documentation

## 2020-05-20 DIAGNOSIS — F17218 Nicotine dependence, cigarettes, with other nicotine-induced disorders: Secondary | ICD-10-CM | POA: Insufficient documentation

## 2020-05-20 DIAGNOSIS — L97512 Non-pressure chronic ulcer of other part of right foot with fat layer exposed: Secondary | ICD-10-CM | POA: Insufficient documentation

## 2020-05-20 DIAGNOSIS — I110 Hypertensive heart disease with heart failure: Secondary | ICD-10-CM | POA: Insufficient documentation

## 2020-05-20 DIAGNOSIS — E1142 Type 2 diabetes mellitus with diabetic polyneuropathy: Secondary | ICD-10-CM | POA: Insufficient documentation

## 2020-05-20 DIAGNOSIS — I5042 Chronic combined systolic (congestive) and diastolic (congestive) heart failure: Secondary | ICD-10-CM | POA: Diagnosis not present

## 2020-05-20 DIAGNOSIS — Z8614 Personal history of Methicillin resistant Staphylococcus aureus infection: Secondary | ICD-10-CM | POA: Diagnosis not present

## 2020-05-20 DIAGNOSIS — Z89411 Acquired absence of right great toe: Secondary | ICD-10-CM | POA: Diagnosis not present

## 2020-05-20 DIAGNOSIS — E1151 Type 2 diabetes mellitus with diabetic peripheral angiopathy without gangrene: Secondary | ICD-10-CM | POA: Insufficient documentation

## 2020-05-20 NOTE — Progress Notes (Addendum)
[name] ([id number]) Visit Report for 05/20/2020 Chief Complaint Document Details Patient Name: Date of Service: [name], RA NDO [facility] 05/20/2020 11:15 A M Medical Record Number: [id number] Patient Account Number: [id number] Date of Birth/Sex: Treating RN: 1970-09-09 (50 y.o. [name] Primary Care Provider: [name] Other Clinician: Referring Provider: Treating Provider/Extender: [name] in Treatment: 2 Information Obtained from: Patient Chief Complaint Right foot ulcer Electronic Signature(s) Signed: 05/20/2020 11:36:27 AM By: Lenda Kelp PA-C Entered By: Lenda Kelp on 05/20/2020 11:36:26 -------------------------------------------------------------------------------- Debridement Details Patient Name: Date of Service: [name], RA NDO [facility] 05/20/2020 11:15 A M Medical Record Number: [id number] Patient Account Number: [id number] Date of Birth/Sex: Treating RN: March 04, 1970 (50 y.o. [name] Primary Care Provider: [name] Other Clinician: Referring Provider: Treating Provider/Extender: [name] in Treatment: 2 Debridement Performed for Assessment: Wound #3 Right,Medial Foot Performed By: Physician Lenda Kelp, PA Debridement Type: Debridement Severity of Tissue Pre Debridement: Fat layer exposed Level of Consciousness (Pre-procedure): Awake and Alert Pre-procedure Verification/Time Out Yes - 11:50 Taken: Start Time: 11:50 Pain Control: Other : benzocaine 20% spray T Area Debrided (L x W): otal 2.7 (cm) x 1.5 (cm) = 4.05 (cm) Tissue and other material debrided: Callus, Slough, Subcutaneous, Slough Level: Skin/Subcutaneous Tissue Debridement Description: Excisional Instrument: Curette Bleeding: Minimum Hemostasis Achieved: Silver Nitrate End Time: 11:58 Procedural Pain: 0 Post Procedural Pain: 0 Response to Treatment: Procedure was tolerated well Level of  Consciousness (Post- Awake and Alert procedure): Post Debridement Measurements of Total Wound Length: (cm) 2.7 Width: (cm) 1.5 Depth: (cm) 0.3 Volume: (cm) 0.954 Character of Wound/Ulcer Post Debridement: Improved Severity of Tissue Post Debridement: Fat layer exposed Post Procedure Diagnosis Same as Pre-procedure Electronic Signature(s) Signed: 05/20/2020 6:39:46 PM By: Lenda Kelp PA-C Signed: 05/20/2020 7:02:06 PM By: [name] RN, BSN Entered By: [name] on 05/20/2020 11:58:49 -------------------------------------------------------------------------------- HPI Details Patient Name: Date of Service: [name], RA NDO [facility] 05/20/2020 11:15 A M Medical Record Number: [id number] Patient Account Number: [id number] Date of Birth/Sex: Treating RN: 09-22-1970 (50 y.o. [name] Primary Care Provider: [name] Other Clinician: Referring Provider: Treating Provider/Extender: [name] in Treatment: 2 History of Present Illness HPI Description: ADMISSION 05/13/2019 This is a 50 year old man who has type 2 diabetes with peripheral neuropathy. He has a history of wounds on the plantar aspect of his left first and fourth toes. He has had these for several months. He was being seen in the wound care center at [facility] in [facility]. He apparently presented with swelling and an open wound at the tip of the toe. An MRI showed osteomyelitis. He was given 6 to 8 weeks of oral doxycycline again all of this via the patient we have none of these records. Since then he has been putting Gold[place] on the areas wearing regular shoes. He was seen by his primary physician on 4/9 and sent down here for our review. In the primary care note it says he has been recommended for hyperbaric oxygen. Past medical history includes type 2 diabetes with peripheral neuropathy, heart failure with reduced ejection fraction 30 to 35%, peripheral  arterial disease, coronary artery disease, hyperlipidemia, hypertension, syncope and a prior history of a right great toe amputation also by Dr. Lynden Ang in [place] ABIs in our clinic were 0.91 on the left. Also notable that in 2019 he appears to have had arterial studies that  are visible in our system. At that point the right ABI was 1.17, TBI of 0.85 with triphasic waveforms. On the left his ABI was 1.19 with a TBI of 0.73 again with triphasic waveform 5/3; we did receive some information from [facility] wound care. The patient was seen there on 1/29. At that point he had wounds on the right foot at the amputation site the fifth digit fourth digit I am presuming on the right and then an area on the left first digit although that is not specifically stated, he has had a previous amputation on the right however. It would appear him at that time most of his ulcers were on the right the left foot is stated to have a lot of scales and calluses but not a lot of open wounds. The only wounds we define when he came in here last time were on the left first and left fourth toe He also had a wound culture that showed heavy growth of staph aureus and a slight growth of Stenotrophomonas maltophilia. The doxycycline should cover the staph aureus I am not sure about the stenotrophomonas. In any case he completed 6 weeks of this. UNFORTUNATELY I still do not have a copy of the MRI. And the exact justification for hyperbarics is therefore lacking. 5/10; the patient had osteomyelitis in the left fourth toe. He was treated for 6 weeks of doxycycline this wound is healed as is the left first toe. He was sent here for hyperbaric oxygen however at this point his wounds are healed and I think a course of watchful waiting and observation is in order. If the left fourth toe reopens then it is likely he will need a more protracted and aggressive treatment approach Readmission: 05/06/20 upon evaluation today patient presents for  readmission here in the clinic exam issues with his right foot on the medial aspect at the amputation site where his great toe was this is the first metatarsal area that is affected. He has been seeing Dr. Marcha Solders who performed the surgery. With that being said currently the patient was diagnosed as having MRSA on a culture that was + March 31. Has been on IV vancomycin for 6 weeks he is now on doxycycline as the MRSA was sensitive to Doxy and he does have 2 more refills he has been on that for 3 weeks already. He has MRI of January for showed L knee first metatarsal base early osteomyelitis versus possible reactive disease but nonetheless based on what was seen it appears osteomyelitis is more likely the cause here. He has had Apligraf although to be honest that did not seem to do the job. He is also been using Dakin's solution and currently is using Aquacel. Sharp debridement has been performed by Dr. Marcha Solders on a weekly basis according to what the patient tells me. With all that being said the patient was referred to [place] for further evaluation and treatment as unfortunately he does not seem to be responding well to his treatment with the surgeon. They have considered a total contact cast it sounds like but again with the infection that this was not a good idea. The patient does have a significant past medical history for diabetes mellitus type 2 for which she is on insulin, hypertension, significant congestive heart failure with a most recent ejection fraction of 30 to 35% and that was in 2020. He has not seen his cardiologist [name] since that time. In regard to his diabetes his A1c he  does not even know he tells me his blood sugars run somewhere in the 200-300 range. With that being said he has not seen the primary care provider recently for management of this either. The patient tells me he is also a current smoker. He tells me he is trying to stop using nicotine pouches but he just does not have  enough saliva he tells me his mouth is dry all the time this is probably a subsequent issue from the diabetes as well. Unfortunately he just appears to be in a place where he is not necessarily at his healthiest even with his medical conditions. I think he would be a candidate for hyperbaric oxygen therapy but there are a lot of other things that need to be in place first before we get to that point. 05/13/2020 patient presents for 1 week follow-up. He has been using Hydrofera Blue every other day without any issues. He had to reschedule his cardiologist appointment due to transportation Issues. He has no complaints today. 05/20/2020 upon evaluation today patient appears to be doing well with regard to his foot ulcer all things considered. With that being said he tells me that he has been tolerating the dressing changes without complication. There does not appear to be any signs of active infection which is great news and overall very pleased with where things stand today. No fevers, chills, nausea, vomiting, or diarrhea. Electronic Signature(s) Signed: 05/20/2020 12:59:44 PM By: Lenda Kelp PA-C Entered By: Lenda Kelp on 05/20/2020 12:59:44 -------------------------------------------------------------------------------- Physical Exam Details Patient Name: Date of Service: [name], RA NDO [facility] 05/20/2020 11:15 A M Medical Record Number: [id number] Patient Account Number: [id number] Date of Birth/Sex: Treating RN: September 25, 1970 (50 y.o. [name] Primary Care Provider: [name] Other Clinician: Referring Provider: Treating Provider/Extender: [name] in Treatment: 2 Constitutional Obese and well-hydrated in no acute distress. Respiratory normal breathing without difficulty. Psychiatric this patient is able to make decisions and demonstrates good insight into disease process. Alert and Oriented x 3. pleasant and cooperative. Notes Upon evaluation  patient's wound bed actually showed signs of some poor tissue around the edges of the wound opening. This is very spongy and I think really needs to be cleaned away. I discussed this with him today. He is in agreement with sharp debridement I did perform a fairly aggressive sharp debridement to remove some of the spongy callus from around the edges of the wound. He tolerated all that without complication and postdebridement the wound bed appears to be doing significantly better which is great news. Electronic Signature(s) Signed: 05/20/2020 1:00:13 PM By: Lenda Kelp PA-C Entered By: Lenda Kelp on 05/20/2020 13:00:13 -------------------------------------------------------------------------------- Physician Orders Details Patient Name: Date of Service: [name], RA NDO [facility] 05/20/2020 11:15 A M Medical Record Number: [id number] Patient Account Number: [id number] Date of Birth/Sex: Treating RN: 09/19/1970 (50 y.o. [name] Primary Care Provider: [name] Other Clinician: Referring Provider: Treating Provider/Extender: [name] in Treatment: 2 Verbal / Phone Orders: No Diagnosis Coding ICD-10 Coding Code Description E11.621 Type 2 diabetes mellitus with foot ulcer L97.512 Non-pressure chronic ulcer of other part of right foot with fat layer exposed E11.42 Type 2 diabetes mellitus with diabetic polyneuropathy I10 Essential (primary) hypertension I50.42 Chronic combined systolic (congestive) and diastolic (congestive) heart failure F17.218 Nicotine dependence, cigarettes, with other nicotine-induced disorders Follow-up Appointments ppointment in 1 week. - with Leonard Schwartz Return A Bathing/ [employer] May shower  with protection but do not get wound dressing(s) wet. Off-Loading Wedge shoe to: - right foot to ambulate Additional Orders / Instructions Stop/Decrease Smoking Follow Nutritious Diet Home Health No change in wound care orders  this week; continue Home Health for wound care. May utilize formulary equivalent dressing for wound treatment orders unless otherwise specified. Other Home Health Orders/Instructions: - [place] Wound Treatment Wound #3 - Foot Wound Laterality: Right, Medial Prim Dressing: Hydrofera Blue Classic Foam, 4x4 in 3 x Per Week/30 Days ary Discharge Instructions: Moisten with saline prior to applying to wound,cut to fit inside wound edges Secondary Dressing: Zetuvit Plus Silicone Border Dressing 5x5 (in/in) 3 x Per Week/30 Days Discharge Instructions: Apply silicone border over primary dressing , or may use gauze and secure with rolled gauze Patient Medications llergies: No Known Allergies A Notifications Medication Indication Start End prior to debridement 05/20/2020 benzocaine DOSE topical 20 % aerosol - aerosol topical Electronic Signature(s) Signed: 05/20/2020 6:39:46 PM By: Lenda Kelp PA-C Signed: 05/20/2020 7:02:06 PM By: [name] RN, BSN Entered By: [name] on 05/20/2020 11:59:56 -------------------------------------------------------------------------------- Problem List Details Patient Name: Date of Service: [name], RA NDO [facility] 05/20/2020 11:15 A M Medical Record Number: [id number] Patient Account Number: [id number] Date of Birth/Sex: Treating RN: [birth year] (49 y.o. Bayard Hugger, Bonita Quin Primary Care Provider: [name] Other Clinician: Referring Provider: Treating Provider/Extender: [name] in Treatment: 2 Active Problems ICD-10 Encounter Code Description Active Date MDM Diagnosis E11.621 Type 2 diabetes mellitus with foot ulcer 05/06/2020 No Yes L97.512 Non-pressure chronic ulcer of other part of right foot with fat layer exposed 05/06/2020 No Yes E11.42 Type 2 diabetes mellitus with diabetic polyneuropathy 05/06/2020 No Yes I10 Essential (primary) hypertension 05/06/2020 No Yes I50.42 Chronic combined systolic (congestive)  and diastolic (congestive) heart failure 05/06/2020 No Yes F17.218 Nicotine dependence, cigarettes, with other nicotine-induced disorders 05/06/2020 No Yes Inactive Problems Resolved Problems Electronic Signature(s) Signed: 05/20/2020 11:36:11 AM By: Lenda Kelp PA-C Entered By: Lenda Kelp on 05/20/2020 11:36:11 -------------------------------------------------------------------------------- Progress Note Details Patient Name: Date of Service: [name], RA NDO [facility] 05/20/2020 11:15 A M Medical Record Number: [id number] Patient Account Number: [id number] Date of Birth/Sex: Treating RN: December 09, 1970 (50 y.o. [name] Primary Care Provider: [name] Other Clinician: Referring Provider: Treating Provider/Extender: [name] in Treatment: 2 Subjective Chief Complaint Information obtained from Patient Right foot ulcer History of Present Illness (HPI) ADMISSION 05/13/2019 This is a 50 year old man who has type 2 diabetes with peripheral neuropathy. He has a history of wounds on the plantar aspect of his left first and fourth toes. He has had these for several months. He was being seen in the wound care center at [facility] in [facility]. He apparently presented with swelling and an open wound at the tip of the toe. An MRI showed osteomyelitis. He was given 6 to 8 weeks of oral doxycycline again all of this via the patient we have none of these records. Since then he has been putting [place] on the areas wearing regular shoes. He was seen by his primary physician on 4/9 and sent down here for our review. In the primary care note it says he has been recommended for hyperbaric oxygen. Past medical history includes type 2 diabetes with peripheral neuropathy, heart failure with reduced ejection fraction 30 to 35%, peripheral arterial disease, coronary artery disease, hyperlipidemia, hypertension, syncope and a prior history of a right  great toe amputation also  by Dr. Lynden Ang in [place] ABIs in our clinic were 0.91 on the left. Also notable that in 2019 he appears to have had arterial studies that are visible in our system. At that point the right ABI was 1.17, TBI of 0.85 with triphasic waveforms. On the left his ABI was 1.19 with a TBI of 0.73 again with triphasic waveform 5/3; we did receive some information from [facility] wound care. The patient was seen there on 1/29. At that point he had wounds on the right foot at the amputation site the fifth digit fourth digit I am presuming on the right and then an area on the left first digit although that is not specifically stated, he has had a previous amputation on the right however. It would appear him at that time most of his ulcers were on the right the left foot is stated to have a lot of scales and calluses but not a lot of open wounds. The only wounds we define when he came in here last time were on the left first and left fourth toe He also had a wound culture that showed heavy growth of staph aureus and a slight growth of Stenotrophomonas maltophilia. The doxycycline should cover the staph aureus I am not sure about the stenotrophomonas. In any case he completed 6 weeks of this. UNFORTUNATELY I still do not have a copy of the MRI. And the exact justification for hyperbarics is therefore lacking. 5/10; the patient had osteomyelitis in the left fourth toe. He was treated for 6 weeks of doxycycline this wound is healed as is the left first toe. He was sent here for hyperbaric oxygen however at this point his wounds are healed and I think a course of watchful waiting and observation is in order. If the left fourth toe reopens then it is likely he will need a more protracted and aggressive treatment approach Readmission: 05/06/20 upon evaluation today patient presents for readmission here in the clinic exam issues with his right foot on the medial aspect at the amputation site where  his great toe was this is the first metatarsal area that is affected. He has been seeing Dr. Marcha Solders who performed the surgery. With that being said currently the patient was diagnosed as having MRSA on a culture that was + March 31. Has been on IV vancomycin for 6 weeks he is now on doxycycline as the MRSA was sensitive to Doxy and he does have 2 more refills he has been on that for 3 weeks already. He has MRI of January for showed L knee first metatarsal base early osteomyelitis versus possible reactive disease but nonetheless based on what was seen it appears osteomyelitis is more likely the cause here. He has had Apligraf although to be honest that did not seem to do the job. He is also been using Dakin's solution and currently is using Aquacel. Sharp debridement has been performed by Dr. Marcha Solders on a weekly basis according to what the patient tells me. With all that being said the patient was referred to [place] for further evaluation and treatment as unfortunately he does not seem to be responding well to his treatment with the surgeon. They have considered a total contact cast it sounds like but again with the infection that this was not a good idea. The patient does have a significant past medical history for diabetes mellitus type 2 for which she is on insulin, hypertension, significant congestive heart failure with a most recent ejection fraction of  30 to 35% and that was in 2020. He has not seen his cardiologist [name] since that time. In regard to his diabetes his A1c he does not even know he tells me his blood sugars run somewhere in the 200-300 range. With that being said he has not seen the primary care provider recently for management of this either. The patient tells me he is also a current smoker. He tells me he is trying to stop using nicotine pouches but he just does not have enough saliva he tells me his mouth is dry all the time this is probably a subsequent issue from the diabetes as  well. Unfortunately he just appears to be in a place where he is not necessarily at his healthiest even with his medical conditions. I think he would be a candidate for hyperbaric oxygen therapy but there are a lot of other things that need to be in place first before we get to that point. 05/13/2020 patient presents for 1 week follow-up. He has been using Hydrofera Blue every other day without any issues. He had to reschedule his cardiologist appointment due to transportation Issues. He has no complaints today. 05/20/2020 upon evaluation today patient appears to be doing well with regard to his foot ulcer all things considered. With that being said he tells me that he has been tolerating the dressing changes without complication. There does not appear to be any signs of active infection which is great news and overall very pleased with where things stand today. No fevers, chills, nausea, vomiting, or diarrhea. Objective Constitutional Obese and well-hydrated in no acute distress. Vitals Time Taken: 11:24 AM, Height: 72 in, Weight: 304 lbs, BMI: 41.2, Temperature: 98.1 F, Pulse: 92 bpm, Respiratory Rate: 18 breaths/min, Blood Pressure: 136/90 mmHg, Capillary Blood Glucose: 186 mg/dl. Respiratory normal breathing without difficulty. Psychiatric this patient is able to make decisions and demonstrates good insight into disease process. Alert and Oriented x 3. pleasant and cooperative. General Notes: Upon evaluation patient's wound bed actually showed signs of some poor tissue around the edges of the wound opening. This is very spongy and I think really needs to be cleaned away. I discussed this with him today. He is in agreement with sharp debridement I did perform a fairly aggressive sharp debridement to remove some of the spongy callus from around the edges of the wound. He tolerated all that without complication and postdebridement the wound bed appears to be doing significantly better which is  great news. Integumentary (Hair, Skin) Wound #3 status is Open. Original cause of wound was Gradually Appeared. The date acquired was: 07/18/2019. The wound has been in treatment 2 weeks. The wound is located on the Right,Medial Foot. The wound measures 1.5cm length x 1cm width x 0.9cm depth; 1.178cm^2 area and 1.06cm^3 volume. There is Fat Layer (Subcutaneous Tissue) exposed. There is no tunneling noted, however, there is undermining starting at 12:00 and ending at 12:00 with a maximum distance of 0.3cm. There is a medium amount of serosanguineous drainage noted. The wound margin is thickened. There is large (67-100%) red, pink granulation within the wound bed. There is a small (1-33%) amount of necrotic tissue within the wound bed including Adherent Slough. Assessment Active Problems ICD-10 Type 2 diabetes mellitus with foot ulcer Non-pressure chronic ulcer of other part of right foot with fat layer exposed Type 2 diabetes mellitus with diabetic polyneuropathy Essential (primary) hypertension Chronic combined systolic (congestive) and diastolic (congestive) heart failure Nicotine dependence, cigarettes, with other nicotine-induced disorders  Procedures Wound #3 Pre-procedure diagnosis of Wound #3 is a Diabetic Wound/Ulcer of the Lower Extremity located on the Right,Medial Foot .Severity of Tissue Pre Debridement is: Fat layer exposed. There was a Excisional Skin/Subcutaneous Tissue Debridement with a total area of 4.05 sq cm performed by Lenda KelpStone [name], Ruqayyah Lute, PA. With the following instrument(s): Curette Material removed includes Callus, Subcutaneous Tissue, and Slough after achieving pain control using Other (benzocaine 20% spray). No specimens were taken. A time out was conducted at 11:50, prior to the start of the procedure. A Minimum amount of bleeding was controlled with Silver Nitrate. The procedure was tolerated well with a pain level of 0 throughout and a pain level of 0 following the  procedure. Post Debridement Measurements: 2.7cm length x 1.5cm width x 0.3cm depth; 0.954cm^3 volume. Character of Wound/Ulcer Post Debridement is improved. Severity of Tissue Post Debridement is: Fat layer exposed. Post procedure Diagnosis Wound #3: Same as Pre-Procedure Plan Follow-up Appointments: Return Appointment in 1 week. - with Luana ShuHoyt Bathing/ Shower/ Hygiene: May shower with protection but do not get wound dressing(s) wet. Off-Loading: Wedge shoe to: - right foot to ambulate Additional Orders / Instructions: Stop/Decrease Smoking Follow Nutritious Diet Home Health: No change in wound care orders this week; continue Home Health for wound care. May utilize formulary equivalent dressing for wound treatment orders unless otherwise specified. Other Home Health Orders/Instructions: - [place] The following medication(s) was prescribed: benzocaine topical 20 % aerosol aerosol topical for prior to debridement was prescribed at facility WOUND #3: - Foot Wound Laterality: Right, Medial Prim Dressing: Hydrofera Blue Classic Foam, 4x4 in 3 x Per Week/30 Days ary Discharge Instructions: Moisten with saline prior to applying to wound,cut to fit inside wound edges Secondary Dressing: Zetuvit Plus Silicone Border Dressing 5x5 (in/in) 3 x Per Week/30 Days Discharge Instructions: Apply silicone border over primary dressing , or may use gauze and secure with rolled gauze 1. Would recommend currently that we going continue with the wound care measures as before [facility] I think that still and also an option. 2. Also can recommend at this time that we have the patient continue with the border foam dressing to cover which I think is applying some cushion as well as absorptive ability as far as the dressing is concerned. That seems to be doing quite well. We will see patient back for reevaluation in 1 week here in the clinic. If anything worsens or changes patient  will contact our office for additional recommendations. Electronic Signature(s) Signed: 05/20/2020 1:00:50 PM By: Lenda KelpStone [name], Brach Birdsall PA-C Entered By: Lenda KelpStone [name], Bridie Colquhoun on 05/20/2020 13:00:50 -------------------------------------------------------------------------------- SuperBill Details Patient Name: Date of Service: Jose CrawEYNO LDS [name][facility] Medical Record Number: [id number] Patient Account Number: [id number] Date of Birth/Sex: Treating RN: [birth year] (50 y.o. Jose SchoonerM) Boehlein, Linda Primary Care Provider: Lia HoppingHasanaj, Xaje Other Clinician: Referring Provider: Treating Provider/Extender: Laurann MontanaStone [name], Rubel Heckard Hasanaj, Xaje Weeks in Treatment: 2 Diagnosis Coding ICD-10 Codes Code Description [id number] Type 2 diabetes mellitus with foot ulcer L97.512 Non-pressure chronic ulcer of other part of right foot with fat layer exposed E11.42 Type 2 diabetes mellitus with diabetic polyneuropathy I10 Essential (primary) hypertension I50.42 Chronic combined systolic (congestive) and diastolic (congestive) heart failure F17.218 Nicotine dependence, cigarettes, with other nicotine-induced disorders Facility Procedures CPT4 Code: [id number] Description: 11042 - DEB SUBQ TISSUE 20 SQ CM/< ICD-10 Diagnosis Description L97.512 Non-pressure chronic ulcer of other part of right foot with fat layer exposed Modifier: Quantity: 1 Physician Procedures : CPT4 Code  Description Modifier [id number] 11042 - WC PHYS SUBQ TISS 20 SQ CM ICD-10 Diagnosis Description L97.512 Non-pressure chronic ulcer of other part of right foot with fat layer exposed Quantity: 1 Electronic Signature(s) Signed: 05/20/2020 1:00:58 PM By: Lenda Kelp PA-C Entered By: Lenda Kelp on 05/20/2020 13:00:58

## 2020-05-21 DIAGNOSIS — L97412 Non-pressure chronic ulcer of right heel and midfoot with fat layer exposed: Secondary | ICD-10-CM | POA: Diagnosis not present

## 2020-05-21 DIAGNOSIS — B965 Pseudomonas (aeruginosa) (mallei) (pseudomallei) as the cause of diseases classified elsewhere: Secondary | ICD-10-CM | POA: Diagnosis not present

## 2020-05-21 DIAGNOSIS — M869 Osteomyelitis, unspecified: Secondary | ICD-10-CM | POA: Diagnosis not present

## 2020-05-21 DIAGNOSIS — E1169 Type 2 diabetes mellitus with other specified complication: Secondary | ICD-10-CM | POA: Diagnosis not present

## 2020-05-21 DIAGNOSIS — E11621 Type 2 diabetes mellitus with foot ulcer: Secondary | ICD-10-CM | POA: Diagnosis not present

## 2020-05-21 DIAGNOSIS — B9562 Methicillin resistant Staphylococcus aureus infection as the cause of diseases classified elsewhere: Secondary | ICD-10-CM | POA: Diagnosis not present

## 2020-05-21 NOTE — Progress Notes (Signed)
KINAN, SAFLEY ([id number]) Visit Report for 05/20/2020 Arrival Information Details Patient Name: Date of Service: Gilford Rile NDO [facility] 05/20/2020 11:15 A M Medical Record Number: [id number] Patient Account Number: [id number] Date of Birth/Sex: Treating RN: 1970-08-03 (49 y.o. Bayard Hugger, Bonita Quin Primary Care Madaleine Simmon: [name] Other Clinician: Referring Kajal Scalici: Treating Janetta Vandoren/Extender: [name] in Treatment: 2 Visit Information History Since Last Visit Added or deleted any medications: No Patient Arrived: Cane Any new allergies or adverse reactions: No Arrival Time: 11:23 Had a fall or experienced change in No Accompanied By: self activities of daily living that may affect Transfer Assistance: None risk of falls: Patient Identification Verified: Yes Signs or symptoms of abuse/neglect since last visito No Secondary Verification Process Completed: Yes Hospitalized since last visit: No Patient Requires Transmission-Based Precautions: No Implantable device outside of the clinic excluding No Patient Has Alerts: No cellular tissue based products placed in the center since last visit: Has Dressing in Place as Prescribed: Yes Pain Present Now: No Electronic Signature(s) Signed: 05/20/2020 4:55:09 PM By: Karl Ito Entered By: Karl Ito on 05/20/2020 11:23:48 -------------------------------------------------------------------------------- Lower Extremity Assessment Details Patient Name: Date of Service: [name], RA NDO [facility] 05/20/2020 11:15 A M Medical Record Number: [id number] Patient Account Number: [id number] Date of Birth/Sex: Treating RN: Nov 26, 1970 (49 y.o. Elizebeth Koller Primary Care Alexiya Franqui: [name] Other Clinician: Referring Marquis Down: Treating Allison Deshotels/Extender: [name] Weeks in Treatment: 2 Edema Assessment Assessed: [Left: No] [Right: No] Edema: [Left: N] [Right: o] Calf Left:  Right: Point of Measurement: 34 cm From Medial Instep 43 cm Ankle Left: Right: Point of Measurement: 13 cm From Medial Instep 25.5 cm Vascular Assessment Pulses: Dorsalis Pedis Palpable: [Right:Yes] Electronic Signature(s) Signed: 05/21/2020 5:41:41 PM By: Zandra Abts RN, BSN Entered By: Zandra Abts on 05/20/2020 11:34:16 -------------------------------------------------------------------------------- Multi-Disciplinary Care Plan Details Patient Name: Date of Service: [name], RA NDO [facility] 05/20/2020 11:15 A M Medical Record Number: [id number] Patient Account Number: [id number] Date of Birth/Sex: Treating RN: March 04, 1970 (49 y.o. [name] Primary Care Geniene List: [name] Other Clinician: Referring Dlynn Ranes: Treating Hailei Besser/Extender: [name] in Treatment: 2 Multidisciplinary Care Plan reviewed with physician Active Inactive Nutrition Nursing Diagnoses: Impaired glucose control: actual or potential Potential for alteratiion in Nutrition/Potential for imbalanced nutrition Goals: Patient/caregiver will maintain therapeutic glucose control Date Initiated: 05/13/2020 Target Resolution Date: 06/10/2020 Goal Status: Active Interventions: Assess patient nutrition upon admission and as needed per policy Treatment Activities: Patient referred to Primary Care Physician for further nutritional evaluation : 05/13/2020 Notes: Osteomyelitis Nursing Diagnoses: Infection: osteomyelitis Knowledge deficit related to disease process and management Goals: Patient's osteomyelitis will resolve Date Initiated: 05/13/2020 Target Resolution Date: 06/10/2020 Goal Status: Active Interventions: Assess for signs and symptoms of osteomyelitis resolution every visit Provide education on osteomyelitis Treatment Activities: Systemic antibiotics : 05/13/2020 T ordered outside of clinic : 05/13/2020 est Notes: Wound/Skin Impairment Nursing  Diagnoses: Impaired tissue integrity Knowledge deficit related to ulceration/compromised skin integrity Goals: Patient/caregiver will verbalize understanding of skin care regimen Date Initiated: 05/13/2020 Target Resolution Date: 06/10/2020 Goal Status: Active Ulcer/skin breakdown will have a volume reduction of 30% by week 4 Date Initiated: 05/13/2020 Target Resolution Date: 06/03/2020 Goal Status: Active Interventions: Assess patient/caregiver ability to obtain necessary supplies Assess patient/caregiver ability to perform ulcer/skin care regimen upon admission and as needed Assess ulceration(s) every visit Provide education on ulcer and skin care Treatment Activities: Skin care regimen initiated : 05/13/2020 Topical wound management initiated :  05/13/2020 Notes: Electronic Signature(s) Signed: 05/20/2020 7:02:06 PM By: [name] RN, BSN Entered By: [name] on 05/20/2020 11:50:37 -------------------------------------------------------------------------------- Pain Assessment Details Patient Name: Date of Service: [name], RA NDO [facility] 05/20/2020 11:15 A M Medical Record Number: [id number] Patient Account Number: [id number] Date of Birth/Sex: Treating RN: [birth year] (49 y.o. [name] Primary Care Quitman Norberto: [name] Other Clinician: Referring Jaquann Guarisco: Treating Jawon Dipiero/Extender: [name] in Treatment: 2 Active Problems Location of Pain Severity and Description of Pain Patient Has Paino No Site Locations Pain Management and Medication Current Pain Management: Electronic Signature(s) Signed: 05/20/2020 4:55:09 PM By: Karl Ito Signed: 05/20/2020 7:02:06 PM By: [name] RN, BSN Entered By: Karl Ito on 05/20/2020 11:25:14 -------------------------------------------------------------------------------- Patient/Caregiver Education Details Patient Name: Date of Service: [name], [name]  5/4/2022andnbsp11:15 A M Medical Record Number: [id number] Patient Account Number: [id number] Date of Birth/Gender: Treating RN: December 27, 1970 (49 y.o. [name] Primary Care Physician: [name] Other Clinician: Referring Physician: Treating Physician/Extender: [name] in Treatment: 2 Education Assessment Education Provided To: Patient Education Topics Provided Infection: Methods: Explain/Verbal Responses: Reinforcements needed, State content correctly Offloading: Methods: Explain/Verbal Responses: Reinforcements needed, State content correctly Wound/Skin Impairment: Methods: Explain/Verbal Responses: Reinforcements needed, State content correctly Electronic Signature(s) Signed: 05/20/2020 7:02:06 PM By: [name] RN, BSN Entered By: [name] on 05/20/2020 11:51:20 -------------------------------------------------------------------------------- Wound Assessment Details Patient Name: Date of Service: [name], RA NDO [facility] 05/20/2020 11:15 A M Medical Record Number: [id number] Patient Account Number: [id number] Date of Birth/Sex: Treating RN: 07-11-1970 (49 y.o. [name] Primary Care Yonathan Perrow: [name] Other Clinician: Referring Elowen Debruyn: Treating Jakell Trusty/Extender: [name] in Treatment: 2 Wound Status Wound Number: 3 Primary Diabetic Wound/Ulcer of the Lower Extremity Etiology: Wound Location: Right, Medial Foot Wound Open Wounding Event: Gradually Appeared Status: Date Acquired: 07/18/2019 Comorbid Sleep Apnea, Congestive Heart Failure, Hypertension, Type II Weeks Of Treatment: 2 History: Diabetes, Neuropathy Clustered Wound: No Photos Wound Measurements Length: (cm) 1.5 Width: (cm) 1 Depth: (cm) 0.9 Area: (cm) 1.178 Volume: (cm) 1.06 % Reduction in Area: 63.8% % Reduction in Volume: 59.2% Epithelialization: None Tunneling: No Undermining: Yes Starting Position  (o'clock): 12 Ending Position (o'clock): 12 Maximum Distance: (cm) 0.3 Wound Description Classification: Grade 3 Wound Margin: Thickened Exudate Amount: Medium Exudate Type: Serosanguineous Exudate Color: red, brown Foul Odor After Cleansing: No Slough/Fibrino Yes Wound Bed Granulation Amount: Large (67-100%) Exposed Structure Granulation Quality: Red, Pink Fascia Exposed: No Necrotic Amount: Small (1-33%) Fat Layer (Subcutaneous Tissue) Exposed: Yes Necrotic Quality: Adherent Slough Tendon Exposed: No Muscle Exposed: No Joint Exposed: No Bone Exposed: No Electronic Signature(s) Signed: 05/20/2020 4:55:09 PM By: Karl Ito Signed: 05/20/2020 7:02:06 PM By: [name] RN, BSN Entered By: Karl Ito on 05/20/2020 16:45:18 -------------------------------------------------------------------------------- Vitals Details Patient Name: Date of Service: [name], RA NDO [facility] 05/20/2020 11:15 A M Medical Record Number: [id number] Patient Account Number: [id number] Date of Birth/Sex: Treating RN: 08-Nov-1970 (49 y.o. [name] Primary Care Kaja Jackowski: [name] Other Clinician: Referring Briant Angelillo: Treating Dyllin Gulley/Extender: [name] in Treatment: 2 Vital Signs Time Taken: 11:24 Temperature (F): 98.1 Height (in): 72 Pulse (bpm): 92 Weight (lbs): 304 Respiratory Rate (breaths/min): 18 Body Mass Index (BMI): 41.2 Blood Pressure (mmHg): 136/90 Capillary Blood Glucose (mg/dl): 924 Reference Range: 80 - 120 mg / dl Electronic Signature(s) Signed: 05/20/2020 4:55:09 PM By: Karl Ito Signed: 05/20/2020 4:55:09 PM By: Karl Ito Entered By:  Karl Ito on 05/20/2020 11:25:09

## 2020-05-22 NOTE — Progress Notes (Signed)
[name] ([id number]) Visit Report for 05/06/2020 Allergy List Details Patient Name: Date of Service: [name], RA NDO [facility] 05/06/2020 9:00 A M Medical Record Number: [id number] Patient Account Number: [id number] Date of Birth/Sex: Treating RN: April 11, 1970 (49 y.o. [name] Primary Care [name]: [name] [name]: Treating [name]: [name] [name]: 0 Allergies Active Allergies No Known Allergies Allergy Notes Electronic Signature(s) Signed: 05/07/2020 5:27:18 PM By: Jose Abts RN, BSN Entered By: [name] on 05/06/2020 08:59:53 -------------------------------------------------------------------------------- Arrival Information Details Patient Name: Date of Service: [name], RA NDO [facility] 05/06/2020 9:00 A M Medical Record Number: [id number] Patient Account Number: [id number] Date of Birth/Sex: Treating RN: 1970/11/12 (49 y.o. [name] Primary Care Mirenda Baltazar: [name] Jose Clinician: Referring [name]: Treating [name]: [name] in [name]: 0 Visit Information Patient Arrived: Ambulatory Arrival Time: 08:43 Accompanied By: alone Transfer Assistance: None Patient Identification Verified: Yes Secondary Verification Process Completed: Yes Patient Requires Transmission-Based Precautions: No Patient Has Alerts: No History Since Last Visit Added or deleted any medications: No Any new allergies or adverse reactions: No Had a fall or experienced change in activities of daily living that may affect risk of falls: No Signs or symptoms of abuse/neglect since last visito No Hospitalized since last visit: No Implantable device outside of the clinic excluding cellular tissue based products placed in the center since last visit: No Pain Present Now: No Electronic Signature(s) Signed: 05/07/2020 5:27:18 PM By: Jose Abts  RN, BSN Entered By: [name] on 05/06/2020 08:44:57 -------------------------------------------------------------------------------- Clinic Level of Care Assessment Details Patient Name: Date of Service: [name], RA NDO [facility] 05/06/2020 9:00 A M Medical Record Number: [id number] Patient Account Number: [id number] Date of Birth/Sex: Treating RN: March 16, 1970 (49 y.o. [name]: [name] Jose Clinician: Referring [name]: Treating [name]: [name] in [name]: 0 Clinic Level of Care Assessment Items TOOL 1 Quantity Score []  - 0 Use when EandM and Procedure is performed on INITIAL visit ASSESSMENTS - Nursing Assessment / Reassessment X- 1 20 General Physical Exam (combine w/ comprehensive assessment (listed just below) when performed on new pt. evals) X- 1 25 Comprehensive Assessment (HX, ROS, Risk Assessments, Wounds Hx, etc.) ASSESSMENTS - Wound and Skin Assessment / Reassessment []  - 0 Dermatologic / Skin Assessment (not related to wound area) ASSESSMENTS - Ostomy and/or Continence Assessment and Care []  - 0 Incontinence Assessment and Management []  - 0 Ostomy Care Assessment and Management (repouching, etc.) PROCESS - Coordination of Care X - Simple Patient / Family Education for ongoing care 1 15 []  - 0 Complex (extensive) Patient / Family Education for ongoing care X- 1 10 Staff obtains , Records, T Results / Process Orders est X- 1 10 Staff telephones HHA, Nursing Homes / Clarify orders / etc []  - 0 Routine Transfer to another Facility (non-emergent condition) []  - 0 Routine Hospital Admission (non-emergent condition) X- 1 15 New Admissions / / Ordering NPWT Apligraf, etc. , []  - 0 Emergency Hospital Admission (emergent condition) PROCESS - Special Needs []  - 0 Pediatric / Minor Patient Management []  - 0 Isolation Patient Management []  -  0 Hearing / Language / Visual special needs []  - 0 Assessment of Community assistance (transportation, D/C planning, etc.) []  - 0 Additional assistance / Altered mentation []  - 0 Support Surface(s) Assessment (bed, cushion, seat, etc.) INTERVENTIONS - Miscellaneous []  - 0 External ear exam []  -  0 Patient Transfer (multiple staff / Nurse, adult / Similar devices) []  - 0 Simple Staple / Suture removal (25 or less) []  - 0 Complex Staple / Suture removal (26 or more) []  - 0 Hypo/Hyperglycemic Management (do not check if billed separately) X- 1 15 Ankle / Brachial Index (ABI) - do not check if billed separately Has the patient been seen at the hospital within the last three years: Yes Total Score: 110 Level Of Care: New/Established - Level 3 Electronic Signature(s) Signed: 05/06/2020 6:20:01 PM By: RN, BSN Entered By: on 05/06/2020 09:50:47 -------------------------------------------------------------------------------- Encounter Discharge Information Details Patient Name: Date of Service: [name], RA NDO [facility] 05/06/2020 9:00 A M Medical Record Number: Jose Craw Patient Account Number: [facility] Date of Birth/Sex: Treating RN: 1970-03-14 (49 y.o. [id number], [name] Primary Care Deontrey Massi: [id number] Jose Clinician: Referring Anthoni Geerts: Treating Adil Tugwell/Extender: 08/27/1970 in [name]: 0 Encounter Discharge Information Items Post Procedure Vitals Discharge Condition: Stable Temperature (F): 97.4 Ambulatory Status: Cane Pulse (bpm): 90 Discharge Destination: Home Respiratory Rate (breaths/min): 18 Transportation: Private Auto Blood Pressure (mmHg): 145/93 Accompanied By: self Schedule Follow-up Appointment: Yes Clinical Summary of Care: Patient Declined Electronic Signature(s) Signed: 05/22/2020 3:14:58 PM By: Jose Hopping RN Entered By: [name] on 05/06/2020  10:23:31 -------------------------------------------------------------------------------- Lower Extremity Assessment Details Patient Name: Date of Service: [name], RA NDO [facility] 05/06/2020 9:00 A M Medical Record Number: [name] Patient Account Number: [facility] Date of Birth/Sex: Treating RN: Oct 01, 1970 (49 y.o. [id number] Primary Care Nyari Olsson: [id number] Jose Clinician: Referring Anael Rosch: Treating Nimrat Woolworth/Extender: 08/27/1970 [name]: 0 Edema Assessment Assessed: [name]: No] [Right: No] Edema: [Left: N] [Right: o] Calf Left: Right: Point of Measurement: 34 cm From Medial Instep 41 cm Ankle Left: Right: Point of Measurement: 13 cm From Medial Instep 24 cm Vascular Assessment Pulses: Dorsalis Pedis Palpable: [Right:Yes] Blood Pressure: Brachial: [Right:145] Ankle: [Right:Dorsalis Pedis: 172 1.19] Electronic Signature(s) Signed: 05/07/2020 5:27:18 PM By: Jose Letters RN, BSN Entered By: [name] on 05/06/2020 08:59:43 -------------------------------------------------------------------------------- Pain Assessment Details Patient Name: Date of Service: [name] [name], RA NDO [facility] 05/06/2020 9:00 A M Medical Record Number: Jose Craw Patient Account Number: [facility] Date of Birth/Sex: Treating RN: January 31, 1970 (49 y.o. [id number] Primary Care Alley Neils: [phone] Jose Clinician: Referring Suvi Archuletta: Treating Jaynee Winters/Extender: 08/27/1970 in [name]: 0 Active Problems Location of Pain Severity and Description of Pain Patient Has Paino No Site Locations Pain Management and Medication Current Pain Management: Electronic Signature(s) Signed: 05/07/2020 5:27:18 PM By: Jose Hopping RN, BSN Entered By: [name] on 05/06/2020 09:06:34 -------------------------------------------------------------------------------- Patient/Caregiver Education Details Patient Name: Date of  Service: [name], [name] 4/20/2022andnbsp9:00 A M Medical Record Number: 05/08/2020 Patient Account Number: [name] Date of Birth/Gender: Treating RN: 1970/07/03 (49 y.o. [id number] Primary Care Physician: [phone] Jose Clinician: Referring Physician: Treating Physician/Extender: 08/27/1970 in [name]: 0 Education Assessment Education Provided To: Patient Education Topics Provided Hyperbaric Oxygenation: Handouts: Hyperbaric Oxygen Methods: Explain/Verbal, Printed Responses: Reinforcements needed, State content correctly [facility]: o Handouts: Welcome [facility] o Methods: Explain/Verbal, Printed Responses: Reinforcements needed, State content correctly Wound/Skin Impairment: Handouts: Caring for Your Ulcer, Skin Care Do's and Dont's, Smoking and Wound Healing Methods: Explain/Verbal, Printed Responses: Reinforcements needed, State content correctly Electronic Signature(s) Signed: 05/06/2020 6:20:01 PM By: Jose Hopping RN, BSN Entered By: [name] on  05/06/2020 09:48:57 -------------------------------------------------------------------------------- Wound Assessment Details Patient Name: Date of Service: [name], RA NDO [facility] 05/06/2020 9:00 A M Medical Record Number: [id number] Patient Account Number: [id number] Date of Birth/Sex: Treating RN: [birth year] (49 y.o. [name] Primary Care Yaretzy Olazabal: [name] Jose Clinician: Referring Cuahutemoc Attar: Treating Herndon Grill/Extender: [name] [name]: 0 Wound Status Wound Number: 3 Primary Diabetic Wound/Ulcer of the Lower Extremity Etiology: Wound Location: Right, Medial Foot Wound Open Wounding Event: Gradually Appeared Status: Date Acquired: 07/18/2019 Comorbid Sleep Apnea, Congestive Heart Failure, Hypertension, Type II Jose Of [name]: 0 History: Diabetes, Neuropathy Clustered  Wound: No Photos Wound Measurements Length: (cm) 2.3 Width: (cm) 1.8 Depth: (cm) 0.8 Area: (cm) 3.252 Volume: (cm) 2.601 % Reduction in Area: 0% % Reduction in Volume: 0% Epithelialization: None Tunneling: No Undermining: Yes Starting Position (o'clock): 6 Ending Position (o'clock): 12 Maximum Distance: (cm) 0.4 Wound Description Classification: Grade 3 Wound Margin: Thickened Exudate Amount: Medium Exudate Type: Serosanguineous Exudate Color: red, brown Wound Bed Granulation Amount: Large (67-100%) Granulation Quality: Red, Pink Necrotic Amount: Small (1-33%) Necrotic Quality: Eschar, Adherent Slough Foul Odor After Cleansing: No Slough/Fibrino Yes Exposed Structure Fascia Exposed: No Fat Layer (Subcutaneous Tissue) Exposed: Yes Tendon Exposed: No Muscle Exposed: No Joint Exposed: No Bone Exposed: No Electronic Signature(s) Signed: 05/06/2020 4:38:38 PM By: [name] Signed: 05/07/2020 5:27:18 PM By: Jose Abts RN, BSN Entered By: [name] on 05/06/2020 16:36:19 -------------------------------------------------------------------------------- Vitals Details Patient Name: Date of Service: [name], RA NDO [facility] 05/06/2020 9:00 A M Medical Record Number: [id number] Patient Account Number: [id number] Date of Birth/Sex: Treating RN: June 29, 1970 (49 y.o. [name] Primary Care Ayaz Sondgeroth: [name] Jose Clinician: Referring Edna Grover: Treating Arelie Kuzel/Extender: [name] in [name]: 0 Vital Signs Time Taken: 08:44 Temperature (F): 98.0 Height (in): 72 Pulse (bpm): 90 Source: Stated Respiratory Rate (breaths/min): 18 Weight (lbs): 304 Blood Pressure (mmHg): 145/93 Source: Stated Reference Range: 80 - 120 mg / dl Body Mass Index (BMI): 41.2 Electronic Signature(s) Signed: 05/07/2020 5:27:18 PM By: Jose Abts RN, BSN Entered By: [name] on 05/06/2020 08:45:50

## 2020-05-25 DIAGNOSIS — B965 Pseudomonas (aeruginosa) (mallei) (pseudomallei) as the cause of diseases classified elsewhere: Secondary | ICD-10-CM | POA: Diagnosis not present

## 2020-05-25 DIAGNOSIS — B9562 Methicillin resistant Staphylococcus aureus infection as the cause of diseases classified elsewhere: Secondary | ICD-10-CM | POA: Diagnosis not present

## 2020-05-25 DIAGNOSIS — L97412 Non-pressure chronic ulcer of right heel and midfoot with fat layer exposed: Secondary | ICD-10-CM | POA: Diagnosis not present

## 2020-05-25 DIAGNOSIS — E1169 Type 2 diabetes mellitus with other specified complication: Secondary | ICD-10-CM | POA: Diagnosis not present

## 2020-05-25 DIAGNOSIS — E11621 Type 2 diabetes mellitus with foot ulcer: Secondary | ICD-10-CM | POA: Diagnosis not present

## 2020-05-25 DIAGNOSIS — M869 Osteomyelitis, unspecified: Secondary | ICD-10-CM | POA: Diagnosis not present

## 2020-05-27 ENCOUNTER — Other Ambulatory Visit: Payer: Self-pay

## 2020-05-27 ENCOUNTER — Encounter (HOSPITAL_BASED_OUTPATIENT_CLINIC_OR_DEPARTMENT_OTHER): Payer: Medicare Other | Admitting: Physician Assistant

## 2020-05-27 DIAGNOSIS — E11621 Type 2 diabetes mellitus with foot ulcer: Secondary | ICD-10-CM | POA: Diagnosis not present

## 2020-05-27 DIAGNOSIS — E1142 Type 2 diabetes mellitus with diabetic polyneuropathy: Secondary | ICD-10-CM | POA: Diagnosis not present

## 2020-05-27 DIAGNOSIS — I110 Hypertensive heart disease with heart failure: Secondary | ICD-10-CM | POA: Diagnosis not present

## 2020-05-27 DIAGNOSIS — E1151 Type 2 diabetes mellitus with diabetic peripheral angiopathy without gangrene: Secondary | ICD-10-CM | POA: Diagnosis not present

## 2020-05-27 DIAGNOSIS — I5042 Chronic combined systolic (congestive) and diastolic (congestive) heart failure: Secondary | ICD-10-CM | POA: Diagnosis not present

## 2020-05-27 DIAGNOSIS — Z8614 Personal history of Methicillin resistant Staphylococcus aureus infection: Secondary | ICD-10-CM | POA: Diagnosis not present

## 2020-05-27 DIAGNOSIS — Z89411 Acquired absence of right great toe: Secondary | ICD-10-CM | POA: Diagnosis not present

## 2020-05-27 DIAGNOSIS — L97512 Non-pressure chronic ulcer of other part of right foot with fat layer exposed: Secondary | ICD-10-CM | POA: Diagnosis not present

## 2020-05-27 DIAGNOSIS — Z8249 Family history of ischemic heart disease and other diseases of the circulatory system: Secondary | ICD-10-CM | POA: Diagnosis not present

## 2020-05-27 DIAGNOSIS — G4730 Sleep apnea, unspecified: Secondary | ICD-10-CM | POA: Diagnosis not present

## 2020-05-27 DIAGNOSIS — E785 Hyperlipidemia, unspecified: Secondary | ICD-10-CM | POA: Diagnosis not present

## 2020-05-27 DIAGNOSIS — F17218 Nicotine dependence, cigarettes, with other nicotine-induced disorders: Secondary | ICD-10-CM | POA: Diagnosis not present

## 2020-05-27 NOTE — Progress Notes (Addendum)
DEADRICK, STIDD ([id number]) Visit Report for 05/27/2020 Chief Complaint Document Details Patient Name: Date of Service: [name], RA NDO [name] 05/27/2020 1:00 PM Medical Record Number: [id number] Patient Account Number: [id number] Date of Birth/Sex: Treating RN: 1970/07/26 (50 y.o. [name] Primary Care Provider: Lia [name] Other Clinician: Referring Provider: Treating Provider/Extender: [name] in Treatment: 3 Information Obtained from: Patient Chief Complaint Right foot ulcer Electronic Signature(s) Signed: 05/27/2020 1:32:13 PM By: Jose Kelp PA-C Entered By: [name] on 05/27/2020 13:32:13 -------------------------------------------------------------------------------- Debridement Details Patient Name: Date of Service: [name], RA NDO [facility] 05/27/2020 1:00 PM Medical Record Number: [id number] Patient Account Number: [id number] Date of Birth/Sex: Treating RN: 23-May-1970 (50 y.o. [name] Primary Care Provider: Lia [name] Other Clinician: Referring Provider: Treating Provider/Extender: [name] in Treatment: 3 Debridement Performed for Assessment: Wound #3 Right,Medial Foot Performed By: Physician Jose Kelp, PA Debridement Type: Debridement Severity of Tissue Pre Debridement: Fat layer exposed Level of Consciousness (Pre-procedure): Awake and Alert Pre-procedure Verification/Time Out Yes - 13:35 Taken: Start Time: 13:35 Pain Control: Lidocaine 4% T opical Solution T Area Debrided (L x W): otal 2.5 (cm) x 1.3 (cm) = 3.25 (cm) Tissue and other material debrided: Viable, Non-Viable, Callus, Slough, Subcutaneous, Slough Level: Skin/Subcutaneous Tissue Debridement Description: Excisional Instrument: Curette Bleeding: Minimum Hemostasis Achieved: Pressure End Time: 13:42 Procedural Pain: 0 Post Procedural Pain: 0 Response to Treatment: Procedure was tolerated  well Level of Consciousness (Post- Awake and Alert procedure): Post Debridement Measurements of Total Wound Length: (cm) 2.5 Width: (cm) 1.3 Depth: (cm) 0.9 Volume: (cm) 2.297 Character of Wound/Ulcer Post Debridement: Improved Severity of Tissue Post Debridement: Fat layer exposed Post Procedure Diagnosis Same as Pre-procedure Electronic Signature(s) Signed: 05/27/2020 5:37:47 PM By: Jose Kelp PA-C Signed: 05/27/2020 6:13:52 PM By: Jose Deed RN, BSN Entered By: [name] on 05/27/2020 13:40:13 -------------------------------------------------------------------------------- HPI Details Patient Name: Date of Service: [name], RA NDO [facility] 05/27/2020 1:00 PM Medical Record Number: [id number] Patient Account Number: [id number] Date of Birth/Sex: Treating RN: 1970-07-10 (50 y.o. [name] Primary Care Provider: Lia [name] Other Clinician: Referring Provider: Treating Provider/Extender: [name] in Treatment: 3 History of Present Illness HPI Description: ADMISSION 05/13/2019 This is a 50 year old man who has type 2 diabetes with peripheral neuropathy. He has a history of wounds on the plantar aspect of his left first and fourth toes. He has had these for several months. He was being seen in the wound care center at [facility] in [facility]. He apparently presented with swelling and an open wound at the tip of the toe. An MRI showed osteomyelitis. He was given 6 to 8 weeks of oral doxycycline again all of this via the patient we have none of these records. Since then he has been putting Goldbond on the areas wearing regular shoes. He was seen by his primary physician on 4/9 and sent down here for our review. In the primary care note it says he has been recommended for hyperbaric oxygen. Past medical history includes type 2 diabetes with peripheral neuropathy, heart failure with reduced ejection fraction 30 to 35%,  peripheral arterial disease, coronary artery disease, hyperlipidemia, hypertension, syncope and a prior history of a right great toe amputation also by [name] in [place] ABIs in our clinic were 0.91 on the left. Also notable that in 2019 he appears to have had arterial studies that are visible  in our system. At that point the right ABI was 1.17, TBI of 0.85 with triphasic waveforms. On the left his ABI was 1.19 with a TBI of 0.73 again with triphasic waveform 5/3; we did receive some information from [facility] wound care. The patient was seen there on 1/29. At that point he had wounds on the right foot at the amputation site the fifth digit fourth digit I am presuming on the right and then an area on the left first digit although that is not specifically stated, he has had a previous amputation on the right however. It would appear him at that time most of his ulcers were on the right the left foot is stated to have a lot of scales and calluses but not a lot of open wounds. The only wounds we define when he came in here last time were on the left first and left fourth toe He also had a wound culture that showed heavy growth of staph aureus and a slight growth of Stenotrophomonas maltophilia. The doxycycline should cover the staph aureus I am not sure about the stenotrophomonas. In any case he completed 6 weeks of this. UNFORTUNATELY I still do not have a copy of the MRI. And the exact justification for hyperbarics is therefore lacking. 5/10; the patient had osteomyelitis in the left fourth toe. He was treated for 6 weeks of doxycycline this wound is healed as is the left first toe. He was sent here for hyperbaric oxygen however at this point his wounds are healed and I think a course of watchful waiting and observation is in order. If the left fourth toe reopens then it is likely he will need a more protracted and aggressive treatment approach Readmission: 05/06/20 upon evaluation today patient  presents for readmission here in the clinic exam issues with his right foot on the medial aspect at the amputation site where his great toe was this is the first metatarsal area that is affected. He has been seeing Dr. Marcha Solders who performed the surgery. With that being said currently the patient was diagnosed as having MRSA on a culture that was + March 31. Has been on IV vancomycin for 6 weeks he is now on doxycycline as the MRSA was sensitive to Doxy and he does have 2 more refills he has been on that for 3 weeks already. He has MRI of January for showed L knee first metatarsal base early osteomyelitis versus possible reactive disease but nonetheless based on what was seen it appears osteomyelitis is more likely the cause here. He has had Apligraf although to be honest that did not seem to do the job. He is also been using Dakin's solution and currently is using Aquacel. Sharp debridement has been performed by Dr. Marcha Solders on a weekly basis according to what the patient tells me. With all that being said the patient was referred to [place] for further evaluation and treatment as unfortunately he does not seem to be responding well to his treatment with the surgeon. They have considered a total contact cast it sounds like but again with the infection that this was not a good idea. The patient does have a significant past medical history for diabetes mellitus type 2 for which she is on insulin, hypertension, significant congestive heart failure with a most recent ejection fraction of 30 to 35% and that was in 2020. He has not seen his cardiologist [name] since that time. In regard to his diabetes his A1c he does not  even know he tells me his blood sugars run somewhere in the 200-300 range. With that being said he has not seen the primary care provider recently for management of this either. The patient tells me he is also a current smoker. He tells me he is trying to stop using nicotine pouches but he just  does not have enough saliva he tells me his mouth is dry all the time this is probably a subsequent issue from the diabetes as well. Unfortunately he just appears to be in a place where he is not necessarily at his healthiest even with his medical conditions. I think he would be a candidate for hyperbaric oxygen therapy but there are a lot of other things that need to be in place first before we get to that point. 05/13/2020 patient presents for 1 week follow-up. He has been using Hydrofera Blue every other day without any issues. He had to reschedule his cardiologist appointment due to transportation Issues. He has no complaints today. 05/20/2020 upon evaluation today patient appears to be doing well with regard to his foot ulcer all things considered. With that being said he tells me that he has been tolerating the dressing changes without complication. There does not appear to be any signs of active infection which is great news and overall very pleased with where things stand today. No fevers, chills, nausea, vomiting, or diarrhea. 05/27/2020 upon evaluation today patient appears to be doing well with regard to his wound on the foot. He does not require little bit of debridement today but overall he seems to be doing quite well. He actually has his echo on Monday we will see what that shows as well were hoping to be able to get him into the hyperbaric oxygen chamber but again a lot depends on what this test shows he will also need a chest x-ray if this turns out okay before going in the chamber. Electronic Signature(s) Signed: 05/27/2020 1:46:14 PM By: Jose Kelp PA-C Entered By: [name] on 05/27/2020 13:46:13 -------------------------------------------------------------------------------- Physical Exam Details Patient Name: Date of Service: [name], RA NDO [facility] 05/27/2020 1:00 PM Medical Record Number: [id number] Patient Account Number: [id number] Date of Birth/Sex: Treating  RN: 15-Nov-1970 (50 y.o. [name] Primary Care Provider: Lia [name] Other Clinician: Referring Provider: Treating Provider/Extender: [name] in Treatment: 3 Constitutional Well-nourished and well-hydrated in no acute distress. Respiratory normal breathing without difficulty. Psychiatric this patient is able to make decisions and demonstrates good insight into disease process. Alert and Oriented x 3. pleasant and cooperative. Notes Upon inspection patient's wound bed actually showed signs of good granulation epithelization at this point. There does not appear to be any evidence of infection which is great news and overall very pleased with where things stand. No fevers, chills, nausea, vomiting, or diarrhea. Electronic Signature(s) Signed: 05/27/2020 1:46:36 PM By: Jose Kelp PA-C Entered By: [name] on 05/27/2020 13:46:36 -------------------------------------------------------------------------------- Physician Orders Details Patient Name: Date of Service: [name], RA NDO [facility] 05/27/2020 1:00 PM Medical Record Number: [id number] Patient Account Number: [id number] Date of Birth/Sex: Treating RN: 1970/06/20 (50 y.o. [name] Primary Care Provider: Lia [name] Other Clinician: Referring Provider: Treating Provider/Extender: [name] in Treatment: 3 Verbal / Phone Orders: No Diagnosis Coding ICD-10 Coding Code Description E11.621 Type 2 diabetes mellitus with foot ulcer L97.512 Non-pressure chronic ulcer of other part of right foot with fat layer exposed E11.42 Type 2  diabetes mellitus with diabetic polyneuropathy I10 Essential (primary) hypertension I50.42 Chronic combined systolic (congestive) and diastolic (congestive) heart failure F17.218 Nicotine dependence, cigarettes, with other nicotine-induced disorders Follow-up Appointments ppointment in 1 week. - with Leonard Schwartz Return A Bathing/  Shower/ Hygiene May shower with protection but do not get wound dressing(s) wet. Off-Loading Wedge shoe to: - right foot to ambulate Additional Orders / Instructions Stop/Decrease Smoking Follow Nutritious Diet Home Health No change in wound care orders this week; continue Home Health for wound care. May utilize formulary equivalent dressing for wound treatment orders unless otherwise specified. Other Home Health Orders/Instructions: - [facility] Wound Treatment Wound #3 - Foot Wound Laterality: Right, Medial Prim Dressing: Hydrofera Blue Classic Foam, 4x4 in 3 x Per Week/30 Days ary Discharge Instructions: Moisten with saline prior to applying to wound,cut to fit inside wound edges Secondary Dressing: Zetuvit Plus Silicone Border Dressing 5x5 (in/in) 3 x Per Week/30 Days Discharge Instructions: Apply silicone border over primary dressing , or may use gauze and secure with rolled gauze Electronic Signature(s) Signed: 05/27/2020 5:37:47 PM By: Jose Kelp PA-C Signed: 05/27/2020 6:13:52 PM By: Jose Deed RN, BSN Entered By: [name] on 05/27/2020 13:41:06 -------------------------------------------------------------------------------- Problem List Details Patient Name: Date of Service: [name], RA NDO [facility] 05/27/2020 1:00 PM Medical Record Number: [id number] Patient Account Number: [id number] Date of Birth/Sex: Treating RN: [birth year] (50 y.o. Bayard Hugger, Bonita Quin Primary Care Provider: Lia [name] Other Clinician: Referring Provider: Treating Provider/Extender: [name] in Treatment: 3 Active Problems ICD-10 Encounter Code Description Active Date MDM Diagnosis E11.621 Type 2 diabetes mellitus with foot ulcer 05/06/2020 No Yes L97.512 Non-pressure chronic ulcer of other part of right foot with fat layer exposed 05/06/2020 No Yes E11.42 Type 2 diabetes mellitus with diabetic polyneuropathy 05/06/2020 No Yes I10 Essential (primary)  hypertension 05/06/2020 No Yes I50.42 Chronic combined systolic (congestive) and diastolic (congestive) heart failure 05/06/2020 No Yes F17.218 Nicotine dependence, cigarettes, with other nicotine-induced disorders 05/06/2020 No Yes Inactive Problems Resolved Problems Electronic Signature(s) Signed: 05/27/2020 1:31:48 PM By: Jose Kelp PA-C Entered By: [name] on 05/27/2020 13:31:47 -------------------------------------------------------------------------------- Progress Note Details Patient Name: Date of Service: [name], RA NDO [facility] 05/27/2020 1:00 PM Medical Record Number: [id number] Patient Account Number: [id number] Date of Birth/Sex: Treating RN: 09/01/1970 (50 y.o. [name] Primary Care Provider: Lia [name] Other Clinician: Referring Provider: Treating Provider/Extender: [name] in Treatment: 3 Subjective Chief Complaint Information obtained from Patient Right foot ulcer History of Present Illness (HPI) ADMISSION 05/13/2019 This is a 49 year old man who has type 2 diabetes with peripheral neuropathy. He has a history of wounds on the plantar aspect of his left first and fourth toes. He has had these for several months. He was being seen in the wound care center at [facility] in [place]. He apparently presented with swelling and an open wound at the tip of the toe. An MRI showed osteomyelitis. He was given 6 to 8 weeks of oral doxycycline again all of this via the patient we have none of these records. Since then he has been putting Goldbond on the areas wearing regular shoes. He was seen by his primary physician on 4/9 and sent down here for our review. In the primary care note it says he has been recommended for hyperbaric oxygen. Past medical history includes type 2 diabetes with peripheral neuropathy, heart failure with reduced ejection fraction 30 to 35%, peripheral arterial disease, coronary artery  disease, hyperlipidemia, hypertension, syncope and a prior history of a right great toe amputation also by Dr. Cathy in [place] ABIs in our clinic were 0.91 on the left. Also notable that [place] 2019 he appears to have had arterial studies that are visible in our system. At that point the right ABI was 1.17, TBI of 0.85 with triphasic waveforms. On the left his ABI was 1.19 with a TBI of 0.73 again with triphasic waveform 5/3; we did receive some information [facility]. The patient was seen there on 1/29. At that point he had wounds on the right foot at the amputation site the fifth digit fourth digit I am presuming on the right and then an area on the left first digit although that is not specifically stated, he has had a previous amputation on the right however. It would appear him at that time most of his ulcers were on the right the left foot is stated to have a lot of scales and calluses but not a lot of open wounds. The only wounds we define when he came in here last time were on the left first and left fourth toe He also had a wound culture that showed heavy growth of staph aureus and a slight growth of Stenotrophomonas maltophilia. The doxycycline should cover the staph aureus I am not sure about the stenotrophomonas. In any case he completed 6 weeks of this. UNFORTUNATELY I still do not have a copy of the MRI. And the exact justification for hyperbarics is therefore lacking. 5/10; the patient had osteomyelitis in the left fourth toe. He was treated for 6 weeks of doxycycline this wound is healed as is the left first toe. He was sent here for hyperbaric oxygen however at this point his wounds are healed and I think a course of watchful waiting and observation is in order. If the left fourth toe reopens then it is likely he will need a more protracted and aggressive treatment approach Readmission: 05/06/20 upon evaluation today patient presents for readmission here in the clinic exam  issues with his right foot on the medial aspect at the amputation site where his great toe was this is the first metatarsal area that is affected. He has been seeing Dr. Marcha Soldersathey who performed the surgery. With that being said currently the patient was diagnosed as having MRSA on a culture that was + March 31. Has been on IV vancomycin for 6 weeks he is now on doxycycline as the MRSA was sensitive to Doxy and he does have 2 more refills he has been on that for 3 weeks already. He has MRI of January for showed L knee first metatarsal base early osteomyelitis versus possible reactive disease but nonetheless based on what was seen it appears osteomyelitis is more likely the cause here. He has had Apligraf although to be honest that did not seem to do the job. He is also been using Dakin's solution and currently is using Aquacel. Sharp debridement has been performed by Dr. Marcha Soldersathey on a weekly basis according to what the patient tells me. With all that being said the patient was referred to us for further evaluation and treatment as unfortunately he does not seem to be responding well to his treatment with the surgeon. They have considered a total contact cast it sounds like but again with the infection that this was not a good idea. The patient does have a significant past medical history for diabetes mellitus type 2 for which she  is on insulin, hypertension, significant congestive heart failure with a most recent ejection fraction of 30 to 35% and that was in 2020. He has not seen his cardiologist [name] since that time. In regard to his diabetes his A1c he does not even know he tells me his blood sugars run somewhere in the 200-300 range. With that being said he has not seen the primary care provider recently for management of this either. The patient tells me he is also a current smoker. He tells me he is trying to stop using nicotine pouches but he just does not have enough saliva he tells me his mouth  is dry all the time this is probably a subsequent issue from the diabetes as well. Unfortunately he just appears to be in a place where he is not necessarily at his healthiest even with his medical conditions. I think he would be a candidate for hyperbaric oxygen therapy but there are a lot of other things that need to be in place first before we get to that point. 05/13/2020 patient presents for 1 week follow-up. He has been using Hydrofera Blue every other day without any issues. He had to reschedule his cardiologist appointment due to transportation Issues. He has no complaints today. 05/20/2020 upon evaluation today patient appears to be doing well with regard to his foot ulcer all things considered. With that being said he tells me that he has been tolerating the dressing changes without complication. There does not appear to be any signs of active infection which is great news and overall very pleased with where things stand today. No fevers, chills, nausea, vomiting, or diarrhea. 05/27/2020 upon evaluation today patient appears to be doing well with regard to his wound on the foot. He does not require little bit of debridement today but overall he seems to be doing quite well. He actually has his echo on Monday we will see what that shows as well were hoping to be able to get him into the hyperbaric oxygen chamber but again a lot depends on what this test shows he will also need a chest x-ray if this turns out okay before going in the chamber. Objective Constitutional Well-nourished and well-hydrated in no acute distress. Vitals Time Taken: 1:14 PM, Height: 72 in, Weight: 304 lbs, BMI: 41.2, Temperature: 97.9 F, Pulse: 96 bpm, Respiratory Rate: 19 breaths/min, Blood Pressure: 152/82 mmHg. Respiratory normal breathing without difficulty. Psychiatric this patient is able to make decisions and demonstrates good insight into disease process. Alert and Oriented x 3. pleasant and  cooperative. General Notes: Upon inspection patient's wound bed actually showed signs of good granulation epithelization at this point. There does not appear to be any evidence of infection which is great news and overall very pleased with where things stand. No fevers, chills, nausea, vomiting, or diarrhea. Integumentary (Hair, Skin) Wound #3 status is Open. Original cause of wound was Gradually Appeared. The date acquired was: 07/18/2019. The wound has been in treatment 3 weeks. The wound is located on the Right,Medial Foot. The wound measures 2.5cm length x 1.3cm width x 0.9cm depth; 2.553cm^2 area and 2.297cm^3 volume. There is Fat Layer (Subcutaneous Tissue) exposed. There is no tunneling or undermining noted. There is a medium amount of serosanguineous drainage noted. The wound margin is thickened. There is large (67-100%) red, pink granulation within the wound bed. There is a small (1-33%) amount of necrotic tissue within the wound bed including Adherent Slough. Assessment Active Problems ICD-10 Type 2 diabetes mellitus  with foot ulcer Non-pressure chronic ulcer of other part of right foot with fat layer exposed Type 2 diabetes mellitus with diabetic polyneuropathy Essential (primary) hypertension Chronic combined systolic (congestive) and diastolic (congestive) heart failure Nicotine dependence, cigarettes, with other nicotine-induced disorders Procedures Wound #3 Pre-procedure diagnosis of Wound #3 is a Diabetic Wound/Ulcer of the Lower Extremity located on the Right,Medial Foot .Severity of Tissue Pre Debridement is: Fat layer exposed. There was a Excisional Skin/Subcutaneous Tissue Debridement with a total area of 3.25 sq cm performed by Jose Kelp, PA. With the following instrument(s): Curette to remove Viable and Non-Viable tissue/material. Material removed includes Callus, Subcutaneous Tissue, and Slough after achieving pain control using Lidocaine 4% T opical Solution. No  specimens were taken. A time out was conducted at 13:35, prior to the start of the procedure. A Minimum amount of bleeding was controlled with Pressure. The procedure was tolerated well with a pain level of 0 throughout and a pain level of 0 following the procedure. Post Debridement Measurements: 2.5cm length x 1.3cm width x 0.9cm depth; 2.297cm^3 volume. Character of Wound/Ulcer Post Debridement is improved. Severity of Tissue Post Debridement is: Fat layer exposed. Post procedure Diagnosis Wound #3: Same as Pre-Procedure Plan Follow-up Appointments: Return Appointment in 1 week. - with Luana Shu Shower/ Hygiene: May shower with protection but do not get wound dressing(s) wet. Off-Loading: Wedge shoe to: - right foot to ambulate Additional Orders / Instructions: Stop/Decrease Smoking Follow Nutritious Diet Home Health: No change in wound care orders this week; continue Home Health for wound care. May utilize formulary equivalent dressing for wound treatment orders unless otherwise specified. Other Home Health Orders/Instructions: - [place] WOUND #3: - Foot Wound Laterality: Right, Medial Prim Dressing: Hydrofera Blue Classic Foam, 4x4 in 3 x Per Week/30 Days ary Discharge Instructions: Moisten with saline prior to applying to wound,cut to fit inside wound edges Secondary Dressing: Zetuvit Plus Silicone Border Dressing 5x5 (in/in) 3 x Per Week/30 Days Discharge Instructions: Apply silicone border over primary dressing , or may use gauze and secure with rolled gauze 1. Would recommend at this time that we going continue with the wound care measures as before specifically we have been utilizing the offloading shoe to try to keep pressure off of the area. Also think he needs to limit what he does although he does live alone and takes care of 3 dogs so to be honest he has to do some things no matter what. 2. Also can continue with the [facility] which I think is doing well. 3. We  will continue to cover this with a border foam dressing. 4. We will see what the echo shows next week to see whether or not he is a good candidate for hyperbaric oxygen therapy I am hopeful that we will get a good reading on this. We will see patient back for reevaluation in 1 week here in the clinic. If anything worsens or changes patient will contact our office for additional recommendations. Electronic Signature(s) Signed: 05/27/2020 1:47:18 PM By: Jose Kelp PA-C Entered By: [name] on 05/27/2020 13:47:18 -------------------------------------------------------------------------------- SuperBill Details Patient Name: Date of Service: [name], RA [facility] 05/27/2020 Medical Record Number: [id number] Patient Account Number: [id number] Date of Birth/Sex: Treating RN: 01/28/1970 (50 y.o. [name] Primary Care Provider: Lia [name] Other Clinician: Referring Provider: Treating Provider/Extender: [name] in Treatment: 3 Diagnosis Coding ICD-10 Codes Code Description [phone] Type 2 diabetes mellitus with foot ulcer L97.512 Non-pressure  chronic ulcer of other part of right foot with fat layer exposed E11.42 Type 2 diabetes mellitus with diabetic polyneuropathy I10 Essential (primary) hypertension I50.42 Chronic combined systolic (congestive) and diastolic (congestive) heart failure F17.218 Nicotine dependence, cigarettes, with other nicotine-induced disorders Facility Procedures CPT4 Code: 02725366 Description: 11042 - DEB SUBQ TISSUE 20 SQ CM/< ICD-10 Diagnosis Description L97.512 Non-pressure chronic ulcer of other part of right foot with fat layer exposed Modifier: Quantity: 1 Physician Procedures Electronic Signature(s) Signed: 05/27/2020 1:47:25 PM By: Jose Kelp PA-C Entered By: [name] on 05/27/2020 13:47:25

## 2020-05-27 NOTE — Progress Notes (Signed)
[name] ([id number]) Visit Report for 05/27/2020 Arrival Information Details Patient Name: Date of Service: [name], [name] [facility] 05/27/2020 1:00 PM Medical Record Number: [id number] Patient Account Number: [id number] Date of Birth/Sex: Treating RN: 30-Jul-1970 (49 y.o. [name], [name] Primary Care Elsi Stelzer: [name] Other Clinician: Referring Sharene Krikorian: Treating Remmie Bembenek/Extender: [name] in Treatment: 3 Visit Information History Since Last Visit Added or deleted any medications: No Patient Arrived: [name] Any new allergies or adverse reactions: No Arrival Time: 13:12 Had a fall or experienced change in No Accompanied By: self activities of daily living that may affect Transfer Assistance: None risk of falls: Patient Identification Verified: Yes Signs or symptoms of abuse/neglect since last visito No Secondary Verification Process Completed: Yes Hospitalized since last visit: No Patient Requires Transmission-Based Precautions: No Implantable device outside of the clinic excluding No Patient Has Alerts: No cellular tissue based products placed in the center since last visit: Has Dressing in Place as Prescribed: Yes Pain Present Now: No Electronic Signature(s) Signed: 05/27/2020 5:14:45 PM By: Fonnie Mu RN Entered By: Fonnie Mu on 05/27/2020 13:13:08 -------------------------------------------------------------------------------- Encounter Discharge Information Details Patient Name: Date of Service: [name], [name] [facility] 05/27/2020 1:00 PM Medical Record Number: [id number] Patient Account Number: [id number] Date of Birth/Sex: Treating RN: 1970-01-18 (49 y.o. [name], [name] Primary Care Huey Scalia: [name] Other Clinician: Referring Josey Dettmann: Treating Kymiah Araiza/Extender: [name] in Treatment: 3 Encounter Discharge Information Items Post Procedure Vitals Discharge  Condition: Stable Temperature (F): 98.4 Ambulatory Status: Ambulatory Pulse (bpm): 74 Discharge Destination: Home Respiratory Rate (breaths/min): 17 Transportation: Private Auto Blood Pressure (mmHg): 147/74 Accompanied By: self Schedule Follow-up Appointment: Yes Clinical Summary of Care: Patient Declined Electronic Signature(s) Signed: 05/27/2020 5:14:45 PM By: Fonnie Mu RN Entered By: Fonnie Mu on 05/27/2020 14:17:07 -------------------------------------------------------------------------------- Lower Extremity Assessment Details Patient Name: Date of Service: [name], [name] [facility] 05/27/2020 1:00 PM Medical Record Number: [id number] Patient Account Number: [id number] Date of Birth/Sex: Treating RN: 1970-12-08 (49 y.o. [name], [name] Primary Care Rosalba Totty: [name] Other Clinician: Referring Mikea Quadros: Treating Dantre Yearwood/Extender: [name] Weeks in Treatment: 3 Edema Assessment Assessed: [Left: No] [name]: Yes] Edema: [Left: N] [Right: o] Calf Left: Right: Point of Measurement: 34 cm From Medial Instep 42 cm Ankle Left: Right: Point of Measurement: 13 cm From Medial Instep 25 cm Vascular Assessment Pulses: Dorsalis Pedis Palpable: [Right:Yes] Posterior Tibial Palpable: [Right:Yes] Electronic Signature(s) Signed: 05/27/2020 5:14:45 PM By: Fonnie Mu RN Entered By: Fonnie Mu on 05/27/2020 13:14:45 -------------------------------------------------------------------------------- Multi-Disciplinary Care Plan Details Patient Name: Date of Service: [name], [name] [facility] 05/27/2020 1:00 PM Medical Record Number: [id number] Patient Account Number: [id number] Date of Birth/Sex: Treating RN: August 23, 1970 (49 y.o. [name] Primary Care Nuriya Stuck: [name] Other Clinician: Referring Dylyn Mclaren: Treating Marg Macmaster/Extender: [name] in Treatment: 3 Multidisciplinary  Care Plan reviewed with physician Active Inactive Nutrition Nursing Diagnoses: Impaired glucose control: actual or potential Potential for alteratiion in Nutrition/Potential for imbalanced nutrition Goals: Patient/caregiver will maintain therapeutic glucose control Date Initiated: 05/13/2020 Target Resolution Date: 06/10/2020 Goal Status: Active Interventions: Assess patient nutrition upon admission and as needed per policy Treatment Activities: Patient referred to Primary Care Physician for further nutritional evaluation : 05/13/2020 Notes: Osteomyelitis Nursing Diagnoses: Infection: osteomyelitis Knowledge deficit related to disease process and management Goals: Patient's osteomyelitis will resolve Date Initiated: 05/13/2020 Target Resolution Date: 06/10/2020 Goal Status: Active Interventions: Assess for signs and symptoms of  osteomyelitis resolution every visit Provide education on osteomyelitis Treatment Activities: Systemic antibiotics : 05/13/2020 T ordered outside of clinic : 05/13/2020 est Notes: Wound/Skin Impairment Nursing Diagnoses: Impaired tissue integrity Knowledge deficit related to ulceration/compromised skin integrity Goals: Patient/caregiver will verbalize understanding of skin care regimen Date Initiated: 05/13/2020 Target Resolution Date: 06/10/2020 Goal Status: Active Ulcer/skin breakdown will have a volume reduction of 30% by week 4 Date Initiated: 05/13/2020 Target Resolution Date: 06/03/2020 Goal Status: Active Interventions: Assess patient/caregiver ability to obtain necessary supplies Assess patient/caregiver ability to perform ulcer/skin care regimen upon admission and as needed Assess ulceration(s) every visit Provide education on ulcer and skin care Treatment Activities: Skin care regimen initiated : 05/13/2020 Topical wound management initiated : 05/13/2020 Notes: Electronic Signature(s) Signed: 05/27/2020 6:13:52 PM By: [name] RN,  BSN Entered By: [name] on 05/27/2020 13:36:32 -------------------------------------------------------------------------------- Pain Assessment Details Patient Name: Date of Service: [name], [name] [facility] 05/27/2020 1:00 PM Medical Record Number: [id number] Patient Account Number: [id number] Date of Birth/Sex: Treating RN: 03-24-1970 (49 y.o. [name], [name] Primary Care Charlize Hathaway: [name] Other Clinician: Referring Febe Champa: Treating Lovel Suazo/Extender: [name] in Treatment: 3 Active Problems Location of Pain Severity and Description of Pain Patient Has Paino No Site Locations Pain Management and Medication Current Pain Management: Electronic Signature(s) Signed: 05/27/2020 5:14:45 PM By: Fonnie Mu RN Entered By: Fonnie Mu on 05/27/2020 13:14:24 -------------------------------------------------------------------------------- Patient/Caregiver Education Details Patient Name: Date of Service: [name], [name] [facility] 5/11/2022andnbsp1:00 PM Medical Record Number: [id number] Patient Account Number: [id number] Date of Birth/Gender: Treating RN: 1970-12-06 (49 y.o. [name] Primary Care Physician: [name] Other Clinician: Referring Physician: Treating Physician/Extender: [name] in Treatment: 3 Education Assessment Education Provided To: Patient Education Topics Provided Infection: Methods: Explain/Verbal Responses: Reinforcements needed, State content correctly Wound/Skin Impairment: Methods: Explain/Verbal Responses: Reinforcements needed, State content correctly Electronic Signature(s) Signed: 05/27/2020 6:13:52 PM By: [name] RN, BSN Entered By: [name] on 05/27/2020 13:36:53 -------------------------------------------------------------------------------- Wound Assessment Details Patient Name: Date of Service: [name], [name] [facility]  05/27/2020 1:00 PM Medical Record Number: [id number] Patient Account Number: [id number] Date of Birth/Sex: Treating RN: 06/06/1970 (49 y.o. [name], [name] Primary Care Jamariyah Johannsen: [name] Other Clinician: Referring Kyrsten Deleeuw: Treating Dasja Brase/Extender: [name] in Treatment: 3 Wound Status Wound Number: 3 Primary Diabetic Wound/Ulcer of the Lower Extremity Etiology: Wound Location: Right, Medial Foot Wound Open Wounding Event: Gradually Appeared Status: Date Acquired: 07/18/2019 Comorbid Sleep Apnea, Congestive Heart Failure, Hypertension, Type II Weeks Of Treatment: 3 History: Diabetes, Neuropathy Clustered Wound: No Photos Wound Measurements Length: (cm) 2.5 Width: (cm) 1.3 Depth: (cm) 0.9 Area: (cm) 2.553 Volume: (cm) 2.297 % Reduction in Area: 21.5% % Reduction in Volume: 11.7% Epithelialization: None Tunneling: No Undermining: No Wound Description Classification: Grade 3 Wound Margin: Thickened Exudate Amount: Medium Exudate Type: Serosanguineous Exudate Color: red, brown Foul Odor After Cleansing: No Slough/Fibrino Yes Wound Bed Granulation Amount: Large (67-100%) Exposed Structure Granulation Quality: Red, Pink Fascia Exposed: No Necrotic Amount: Small (1-33%) Fat Layer (Subcutaneous Tissue) Exposed: Yes Necrotic Quality: Adherent Slough Tendon Exposed: No Muscle Exposed: No Joint Exposed: No Bone Exposed: No Treatment Notes Wound #3 (Foot) Wound Laterality: Right, Medial Cleanser Peri-Wound Care Topical Primary Dressing Hydrofera Blue Classic Foam, 4x4 in Discharge Instruction: Moisten with saline prior to applying to wound,cut to fit inside wound edges Secondary Dressing Zetuvit Plus Silicone Border Dressing 5x5 (in/in) Discharge Instruction: Apply silicone border over primary  dressing , or may use gauze and secure with rolled gauze Secured With Compression Wrap Compression Stockings Add-Ons Electronic  Signature(s) Signed: 05/27/2020 4:50:41 PM By: Karl Ito Signed: 05/27/2020 5:14:45 PM By: Fonnie Mu RN Entered By: Karl Ito on 05/27/2020 16:42:23 -------------------------------------------------------------------------------- Vitals Details Patient Name: Date of Service: [name], [name] [facility] 05/27/2020 1:00 PM Medical Record Number: [id number] Patient Account Number: [id number] Date of Birth/Sex: Treating RN: Feb 27, 1970 (49 y.o. [name], [name] Primary Care Isaic Syler: [name] Other Clinician: Referring Barron Vanloan: Treating Addaline Peplinski/Extender: [name] in Treatment: 3 Vital Signs Time Taken: 13:14 Temperature (F): 97.9 Height (in): 72 Pulse (bpm): 96 Weight (lbs): 304 Respiratory Rate (breaths/min): 19 Body Mass Index (BMI): 41.2 Blood Pressure (mmHg): 152/82 Reference Range: 80 - 120 mg / dl Electronic Signature(s) Signed: 05/27/2020 5:14:45 PM By: Fonnie Mu RN Entered By: Fonnie Mu on 05/27/2020 13:14:13

## 2020-05-28 DIAGNOSIS — L97412 Non-pressure chronic ulcer of right heel and midfoot with fat layer exposed: Secondary | ICD-10-CM | POA: Diagnosis not present

## 2020-05-28 DIAGNOSIS — B9562 Methicillin resistant Staphylococcus aureus infection as the cause of diseases classified elsewhere: Secondary | ICD-10-CM | POA: Diagnosis not present

## 2020-05-28 DIAGNOSIS — E11621 Type 2 diabetes mellitus with foot ulcer: Secondary | ICD-10-CM | POA: Diagnosis not present

## 2020-05-28 DIAGNOSIS — E1169 Type 2 diabetes mellitus with other specified complication: Secondary | ICD-10-CM | POA: Diagnosis not present

## 2020-05-28 DIAGNOSIS — M869 Osteomyelitis, unspecified: Secondary | ICD-10-CM | POA: Diagnosis not present

## 2020-05-28 DIAGNOSIS — B965 Pseudomonas (aeruginosa) (mallei) (pseudomallei) as the cause of diseases classified elsewhere: Secondary | ICD-10-CM | POA: Diagnosis not present

## 2020-06-01 DIAGNOSIS — B9562 Methicillin resistant Staphylococcus aureus infection as the cause of diseases classified elsewhere: Secondary | ICD-10-CM | POA: Diagnosis not present

## 2020-06-01 DIAGNOSIS — L97412 Non-pressure chronic ulcer of right heel and midfoot with fat layer exposed: Secondary | ICD-10-CM | POA: Diagnosis not present

## 2020-06-01 DIAGNOSIS — E1169 Type 2 diabetes mellitus with other specified complication: Secondary | ICD-10-CM | POA: Diagnosis not present

## 2020-06-01 DIAGNOSIS — B965 Pseudomonas (aeruginosa) (mallei) (pseudomallei) as the cause of diseases classified elsewhere: Secondary | ICD-10-CM | POA: Diagnosis not present

## 2020-06-01 DIAGNOSIS — E11621 Type 2 diabetes mellitus with foot ulcer: Secondary | ICD-10-CM | POA: Diagnosis not present

## 2020-06-01 DIAGNOSIS — M869 Osteomyelitis, unspecified: Secondary | ICD-10-CM | POA: Diagnosis not present

## 2020-06-03 ENCOUNTER — Encounter (HOSPITAL_BASED_OUTPATIENT_CLINIC_OR_DEPARTMENT_OTHER): Payer: Medicare Other | Admitting: Physician Assistant

## 2020-06-03 ENCOUNTER — Other Ambulatory Visit: Payer: Self-pay

## 2020-06-03 DIAGNOSIS — E785 Hyperlipidemia, unspecified: Secondary | ICD-10-CM | POA: Diagnosis not present

## 2020-06-03 DIAGNOSIS — I5042 Chronic combined systolic (congestive) and diastolic (congestive) heart failure: Secondary | ICD-10-CM | POA: Diagnosis not present

## 2020-06-03 DIAGNOSIS — E11621 Type 2 diabetes mellitus with foot ulcer: Secondary | ICD-10-CM | POA: Diagnosis not present

## 2020-06-03 DIAGNOSIS — F17218 Nicotine dependence, cigarettes, with other nicotine-induced disorders: Secondary | ICD-10-CM | POA: Diagnosis not present

## 2020-06-03 DIAGNOSIS — Z8614 Personal history of Methicillin resistant Staphylococcus aureus infection: Secondary | ICD-10-CM | POA: Diagnosis not present

## 2020-06-03 DIAGNOSIS — Z89411 Acquired absence of right great toe: Secondary | ICD-10-CM | POA: Diagnosis not present

## 2020-06-03 DIAGNOSIS — E1151 Type 2 diabetes mellitus with diabetic peripheral angiopathy without gangrene: Secondary | ICD-10-CM | POA: Diagnosis not present

## 2020-06-03 DIAGNOSIS — L97512 Non-pressure chronic ulcer of other part of right foot with fat layer exposed: Secondary | ICD-10-CM | POA: Diagnosis not present

## 2020-06-03 DIAGNOSIS — Z8249 Family history of ischemic heart disease and other diseases of the circulatory system: Secondary | ICD-10-CM | POA: Diagnosis not present

## 2020-06-03 DIAGNOSIS — I110 Hypertensive heart disease with heart failure: Secondary | ICD-10-CM | POA: Diagnosis not present

## 2020-06-03 DIAGNOSIS — E1142 Type 2 diabetes mellitus with diabetic polyneuropathy: Secondary | ICD-10-CM | POA: Diagnosis not present

## 2020-06-03 DIAGNOSIS — G4730 Sleep apnea, unspecified: Secondary | ICD-10-CM | POA: Diagnosis not present

## 2020-06-03 NOTE — Progress Notes (Addendum)
[name], [name] ([id number]) Visit Report for 06/03/2020 Chief Complaint Document Details Patient Name: Date of Service: [name], RA NDO [facility] 06/03/2020 1:30 PM Medical Record Number: [id number] Patient Account Number: [id number] Date of Birth/Sex: Treating RN: 08/15/1970 (50 y.o. [name] Primary Care Provider: [name] Other Clinician: Referring Provider: Treating Provider/Extender: [name] in Treatment: 4 Information Obtained from: Patient Chief Complaint Right foot ulcer Electronic Signature(s) Signed: 06/03/2020 2:09:05 PM By: Lenda Kelp PA-C Entered By: Lenda Kelp on 06/03/2020 14:09:05 -------------------------------------------------------------------------------- Debridement Details Patient Name: Date of Service: [name], RA NDO [facility] 06/03/2020 1:30 PM Medical Record Number: [id number] Patient Account Number: [id number] Date of Birth/Sex: Treating RN: 07/27/1970 (50 y.o. [name] Primary Care Provider: [name] Other Clinician: Referring Provider: Treating Provider/Extender: [name] in Treatment: 4 Debridement Performed for Assessment: Wound #3 Right,Medial Foot Performed By: Physician Lenda Kelp, PA Debridement Type: Debridement Severity of Tissue Pre Debridement: Fat layer exposed Level of Consciousness (Pre-procedure): Awake and Alert Pre-procedure Verification/Time Out Yes - 14:25 Taken: Start Time: 14:26 Pain Control: Other : benzocaine 20% spray T Area Debrided (L x W): otal 2 (cm) x 1.5 (cm) = 3 (cm) Tissue and other material debrided: Viable, Non-Viable, Callus, Slough, Subcutaneous, Slough Level: Skin/Subcutaneous Tissue Debridement Description: Excisional Instrument: Curette Bleeding: Minimum Hemostasis Achieved: Pressure End Time: 14:31 Procedural Pain: 0 Post Procedural Pain: 0 Response to Treatment: Procedure was tolerated well Level  of Consciousness (Post- Awake and Alert procedure): Post Debridement Measurements of Total Wound Length: (cm) 2 Width: (cm) 1.5 Depth: (cm) 0.4 Volume: (cm) 0.942 Character of Wound/Ulcer Post Debridement: Improved Severity of Tissue Post Debridement: Fat layer exposed Post Procedure Diagnosis Same as Pre-procedure Electronic Signature(s) Signed: 06/03/2020 6:10:17 PM By: [name] RN, BSN Signed: 06/04/2020 3:51:27 PM By: Lenda Kelp PA-C Entered By: [name] on 06/03/2020 14:29:00 -------------------------------------------------------------------------------- HPI Details Patient Name: Date of Service: [name], RA NDO [facility] 06/03/2020 1:30 PM Medical Record Number: [id number] Patient Account Number: [id number] Date of Birth/Sex: Treating RN: 1970/02/07 (50 y.o. [name] Primary Care Provider: [name] Other Clinician: Referring Provider: Treating Provider/Extender: [name] in Treatment: 4 History of Present Illness HPI Description: ADMISSION 05/13/2019 This is a 50 year old man who has type 2 diabetes with peripheral neuropathy. He has a history of wounds on the plantar aspect of his left first and fourth toes. He has had these for several months. He was being seen in the wound care center at [facility] in [facility]. He apparently presented with swelling and an open wound at the tip of the toe. An MRI showed osteomyelitis. He was given 6 to 8 weeks of oral doxycycline again all of this via the patient we have none of these records. Since then he has been putting Goldbond on the areas wearing regular shoes. He was seen by his primary physician on 4/9 and sent down here for our review. In the primary care note it says he has been recommended for hyperbaric oxygen. Past medical history includes type 2 diabetes with peripheral neuropathy, heart failure with reduced ejection fraction 30 to 35%, peripheral  arterial disease, coronary artery disease, hyperlipidemia, hypertension, syncope and a prior history of a right great toe amputation also by Dr. Lynden Ang in [place] ABIs in our clinic were 0.91 on the left. Also notable that in 2019 he appears to have had arterial studies that are visible  in our system. At that point the right ABI was 1.17, TBI of 0.85 with triphasic waveforms. On the left his ABI was 1.19 with a TBI of 0.73 again with triphasic waveform 5/3; we did receive some information from [facility] wound care. The patient was seen there on 1/29. At that point he had wounds on the right foot at the amputation site the fifth digit fourth digit I am presuming on the right and then an area on the left first digit although that is not specifically stated, he has had a previous amputation on the right however. It would appear him at that time most of his ulcers were on the right the left foot is stated to have a lot of scales and calluses but not a lot of open wounds. The only wounds we define when he came in here last time were on the left first and left fourth toe He also had a wound culture that showed heavy growth of staph aureus and a slight growth of Stenotrophomonas maltophilia. The doxycycline should cover the staph aureus I am not sure about the stenotrophomonas. In any case he completed 6 weeks of this. UNFORTUNATELY I still do not have a copy of the MRI. And the exact justification for hyperbarics is therefore lacking. 5/10; the patient had osteomyelitis in the left fourth toe. He was treated for 6 weeks of doxycycline this wound is healed as is the left first toe. He was sent here for hyperbaric oxygen however at this point his wounds are healed and I think a course of watchful waiting and observation is in order. If the left fourth toe reopens then it is likely he will need a more protracted and aggressive treatment approach Readmission: 05/06/20 upon evaluation today patient presents for  readmission here in the clinic exam issues with his right foot on the medial aspect at the amputation site where his great toe was this is the first metatarsal area that is affected. He has been seeing Dr. Marcha Solders who performed the surgery. With that being said currently the patient was diagnosed as having MRSA on a culture that was + March 31. Has been on IV vancomycin for 6 weeks he is now on doxycycline as the MRSA was sensitive to Doxy and he does have 2 more refills he has been on that for 3 weeks already. He has MRI of January for showed L knee first metatarsal base early osteomyelitis versus possible reactive disease but nonetheless based on what was seen it appears osteomyelitis is more likely the cause here. He has had Apligraf although to be honest that did not seem to do the job. He is also been using Dakin's solution and currently is using Aquacel. Sharp debridement has been performed by Dr. Marcha Solders on a weekly basis according to what the patient tells me. With all that being said the patient was referred to [place] for further evaluation and treatment as unfortunately he does not seem to be responding well to his treatment with the surgeon. They have considered a total contact cast it sounds like but again with the infection that this was not a good idea. The patient does have a significant past medical history for diabetes mellitus type 2 for which she is on insulin, hypertension, significant congestive heart failure with a most recent ejection fraction of 30 to 35% and that was in 2020. He has not seen his cardiologist [name] since that time. In regard to his diabetes his A1c he does not  even know he tells me his blood sugars run somewhere in the 200-300 range. With that being said he has not seen the primary care provider recently for management of this either. The patient tells me he is also a current smoker. He tells me he is trying to stop using nicotine pouches but he just does not have  enough saliva he tells me his mouth is dry all the time this is probably a subsequent issue from the diabetes as well. Unfortunately he just appears to be in a place where he is not necessarily at his healthiest even with his medical conditions. I think he would be a candidate for hyperbaric oxygen therapy but there are a lot of other things that need to be in place first before we get to that point. 05/13/2020 patient presents for 1 week follow-up. He has been using Hydrofera Blue every other day without any issues. He had to reschedule his cardiologist appointment due to transportation Issues. He has no complaints today. 05/20/2020 upon evaluation today patient appears to be doing well with regard to his foot ulcer all things considered. With that being said he tells me that he has been tolerating the dressing changes without complication. There does not appear to be any signs of active infection which is great news and overall very pleased with where things stand today. No fevers, chills, nausea, vomiting, or diarrhea. 05/27/2020 upon evaluation today patient appears to be doing well with regard to his wound on the foot. He does not require little bit of debridement today but overall he seems to be doing quite well. He actually has his echo on Monday we will see what that shows as well were hoping to be able to get him into the hyperbaric oxygen chamber but again a lot depends on what this test shows he will also need a chest x-ray if this turns out okay before going in the chamber. 06/03/2020 upon evaluation today patient's wound actually showing signs of minimal improvement. Fortunately I think that he is continuing to make great progress all things considered. He does have his echocardiogram next week. That is good news so we can see whether or not hyperbaric oxygen therapy would be appropriate for him I am hopeful we will be able to get him in the chamber sooner rather than later but again it all  depends on his ejection fraction. Right now we have been somewhat limited in being able to proceed with that as to be perfectly honest we just have not gotten the needed testing in order to go forward with the hyperbarics. If his ejection fraction is too low which could be the case then obviously is not good to be able to Electronic Signature(s) Signed: 06/03/2020 3:25:08 PM By: Lenda Kelp PA-C Entered By: Lenda Kelp on 06/03/2020 15:25:07 -------------------------------------------------------------------------------- Physical Exam Details Patient Name: Date of Service: [name], RA NDO [facility] 06/03/2020 1:30 PM Medical Record Number: [id number] Patient Account Number: [id number] Date of Birth/Sex: Treating RN: 13-Nov-1970 (49 y.o. [name] Primary Care Provider: [name] Other Clinician: Referring Provider: Treating Provider/Extender: [name] in Treatment: 4 Constitutional Obese and well-hydrated in no acute distress. Respiratory normal breathing without difficulty. Psychiatric this patient is able to make decisions and demonstrates good insight into disease process. Alert and Oriented x 3. pleasant and cooperative. Notes Upon inspection today patient did require sharp debridement I did perform debridement clear away some of the necrotic debris he tolerated that  today without complication I cleared away as much of the spongy tissue as possible. Post debridement the wound bed appears to be doing significantly better. Electronic Signature(s) Signed: 06/03/2020 3:26:49 PM By: Lenda Kelp PA-C Entered By: Lenda Kelp on 06/03/2020 15:26:49 -------------------------------------------------------------------------------- Physician Orders Details Patient Name: Date of Service: [name], RA NDO [facility] 06/03/2020 1:30 PM Medical Record Number: [id number] Patient Account Number: [id number] Date of Birth/Sex: Treating RN: 04-29-1970  (49 y.o. [name] Primary Care Provider: [name] Other Clinician: Referring Provider: Treating Provider/Extender: [name] in Treatment: 4 Verbal / Phone Orders: No Diagnosis Coding ICD-10 Coding Code Description E11.621 Type 2 diabetes mellitus with foot ulcer L97.512 Non-pressure chronic ulcer of other part of right foot with fat layer exposed E11.42 Type 2 diabetes mellitus with diabetic polyneuropathy I10 Essential (primary) hypertension I50.42 Chronic combined systolic (congestive) and diastolic (congestive) heart failure F17.218 Nicotine dependence, cigarettes, with other nicotine-induced disorders Follow-up Appointments Return Appointment in 1 week. Bathing/ Shower/ Hygiene May shower with protection but do not get wound dressing(s) wet. Off-Loading Wedge shoe to: - right foot to ambulate Additional Orders / Instructions Stop/Decrease Smoking Follow Nutritious Diet Home Health No change in wound care orders this week; continue Home Health for wound care. May utilize formulary equivalent dressing for wound treatment orders unless otherwise specified. Other Home Health Orders/Instructions: - [facility] Wound Treatment Wound #3 - Foot Wound Laterality: Right, Medial Prim Dressing: Hydrofera Blue Classic Foam, 4x4 in 3 x Per Week/30 Days ary Discharge Instructions: Moisten with saline prior to applying to wound,cut to fit inside wound edges Secondary Dressing: Zetuvit Plus Silicone Border Dressing 5x5 (in/in) 3 x Per Week/30 Days Discharge Instructions: Apply silicone border over primary dressing , or may use gauze and secure with rolled gauze Patient Medications llergies: No Known Allergies A Notifications Medication Indication Start End prior to debridement 06/03/2020 benzocaine DOSE topical 20 % aerosol - aerosol topical Electronic Signature(s) Signed: 06/03/2020 6:10:17 PM By: [name] RN, BSN Signed: 06/04/2020 3:51:27  PM By: Lenda Kelp PA-C Entered By: [name] on 06/03/2020 14:31:24 -------------------------------------------------------------------------------- Problem List Details Patient Name: Date of Service: [name], RA NDO [facility] 06/03/2020 1:30 PM Medical Record Number: [id number] Patient Account Number: [id number] Date of Birth/Sex: Treating RN: [birth year] (49 y.o. Bayard Hugger, Bonita Quin Primary Care Provider: [name] Other Clinician: Referring Provider: Treating Provider/Extender: [name] in Treatment: 4 Active Problems ICD-10 Encounter Code Description Active Date MDM Diagnosis E11.621 Type 2 diabetes mellitus with foot ulcer 05/06/2020 No Yes L97.512 Non-pressure chronic ulcer of other part of right foot with fat layer exposed 05/06/2020 No Yes E11.42 Type 2 diabetes mellitus with diabetic polyneuropathy 05/06/2020 No Yes I10 Essential (primary) hypertension 05/06/2020 No Yes I50.42 Chronic combined systolic (congestive) and diastolic (congestive) heart failure 05/06/2020 No Yes F17.218 Nicotine dependence, cigarettes, with other nicotine-induced disorders 05/06/2020 No Yes Inactive Problems Resolved Problems Electronic Signature(s) Signed: 06/03/2020 2:08:36 PM By: Lenda Kelp PA-C Entered By: Lenda Kelp on 06/03/2020 14:08:36 -------------------------------------------------------------------------------- Progress Note Details Patient Name: Date of Service: [name], RA NDO [facility] 06/03/2020 1:30 PM Medical Record Number: [id number] Patient Account Number: [id number] Date of Birth/Sex: Treating RN: 1970/05/31 (49 y.o. [name] Primary Care Provider: [name] Other Clinician: Referring Provider: Treating Provider/Extender: [name] in Treatment: 4 Subjective Chief Complaint Information obtained from Patient Right foot ulcer History of Present Illness (HPI) ADMISSION 05/13/2019 This  is a  50 year old man who has type 2 diabetes with peripheral neuropathy. He has a history of wounds on the plantar aspect of his left first and fourth toes. He has had these for several months. He was being seen in the wound care center at [facility] in [facility]. He apparently presented with swelling and an open wound at the tip of the toe. An MRI showed osteomyelitis. He was given 6 to 8 weeks of oral doxycycline again all of this via the patient we have none of these records. Since then he has been putting Goldbond on the areas wearing regular shoes. He was seen by his primary physician on 4/9 and sent down here for our review. In the primary care note it says he has been recommended for hyperbaric oxygen. Past medical history includes type 2 diabetes with peripheral neuropathy, heart failure with reduced ejection fraction 30 to 35%, peripheral arterial disease, coronary artery disease, hyperlipidemia, hypertension, syncope and a prior history of a right great toe amputation also by Dr. Lynden Ang in [place] ABIs in our clinic were 0.91 on the left. Also notable that in 2019 he appears to have had arterial studies that are visible in our system. At that point the right ABI was 1.17, TBI of 0.85 with triphasic waveforms. On the left his ABI was 1.19 with a TBI of 0.73 again with triphasic waveform 5/3; we did receive some information from [facility] wound care. The patient was seen there on 1/29. At that point he had wounds on the right foot at the amputation site the fifth digit fourth digit I am presuming on the right and then an area on the left first digit although that is not specifically stated, he has had a previous amputation on the right however. It would appear him at that time most of his ulcers were on the right the left foot is stated to have a lot of scales and calluses but not a lot of open wounds. The only wounds we define when he came in here last time were on the left  first and left fourth toe He also had a wound culture that showed heavy growth of staph aureus and a slight growth of Stenotrophomonas maltophilia. The doxycycline should cover the staph aureus I am not sure about the stenotrophomonas. In any case he completed 6 weeks of this. UNFORTUNATELY I still do not have a copy of the MRI. And the exact justification for hyperbarics is therefore lacking. 5/10; the patient had osteomyelitis in the left fourth toe. He was treated for 6 weeks of doxycycline this wound is healed as is the left first toe. He was sent here for hyperbaric oxygen however at this point his wounds are healed and I think a course of watchful waiting and observation is in order. If the left fourth toe reopens then it is likely he will need a more protracted and aggressive treatment approach Readmission: 05/06/20 upon evaluation today patient presents for readmission here in the clinic exam issues with his right foot on the medial aspect at the amputation site where his great toe was this is the first metatarsal area that is affected. He has been seeing Dr. Marcha Solders who performed the surgery. With that being said currently the patient was diagnosed as having MRSA on a culture that was + March 31. Has been on IV vancomycin for 6 weeks he is now on doxycycline as the MRSA was sensitive to Doxy and he does have 2 more refills he has  been on that for 3 weeks already. He has MRI of January for showed L knee first metatarsal base early osteomyelitis versus possible reactive disease but nonetheless based on what was seen it appears osteomyelitis is more likely the cause here. He has had Apligraf although to be honest that did not seem to do the job. He is also been using Dakin's solution and currently is using Aquacel. Sharp debridement has been performed by Dr. Marcha Solders on a weekly basis according to what the patient tells me. With all that being said the patient was referred to [place] for further  evaluation and treatment as unfortunately he does not seem to be responding well to his treatment with the surgeon. They have considered a total contact cast it sounds like but again with the infection that this was not a good idea. The patient does have a significant past medical history for diabetes mellitus type 2 for which she is on insulin, hypertension, significant congestive heart failure with a most recent ejection fraction of 30 to 35% and that was in 2020. He has not seen his cardiologist [name] since that time. In regard to his diabetes his A1c he does not even know he tells me his blood sugars run somewhere in the 200-300 range. With that being said he has not seen the primary care provider recently for management of this either. The patient tells me he is also a current smoker. He tells me he is trying to stop using nicotine pouches but he just does not have enough saliva he tells me his mouth is dry all the time this is probably a subsequent issue from the diabetes as well. Unfortunately he just appears to be in a place where he is not necessarily at his healthiest even with his medical conditions. I think he would be a candidate for hyperbaric oxygen therapy but there are a lot of other things that need to be in place first before we get to that point. 05/13/2020 patient presents for 1 week follow-up. He has been using Hydrofera Blue every other day without any issues. He had to reschedule his cardiologist appointment due to transportation Issues. He has no complaints today. 05/20/2020 upon evaluation today patient appears to be doing well with regard to his foot ulcer all things considered. With that being said he tells me that he has been tolerating the dressing changes without complication. There does not appear to be any signs of active infection which is great news and overall very pleased with where things stand today. No fevers, chills, nausea, vomiting, or diarrhea. 05/27/2020  upon evaluation today patient appears to be doing well with regard to his wound on the foot. He does not require little bit of debridement today but overall he seems to be doing quite well. He actually has his echo on Monday we will see what that shows as well were hoping to be able to get him into the hyperbaric oxygen chamber but again a lot depends on what this test shows he will also need a chest x-ray if this turns out okay before going in the chamber. 06/03/2020 upon evaluation today patient's wound actually showing signs of minimal improvement. Fortunately I think that he is continuing to make great progress all things considered. He does have his echocardiogram next week. That is good news so we can see whether or not hyperbaric oxygen therapy would be appropriate for him I am hopeful we will be able to get him in the chamber sooner  rather than later but again it all depends on his ejection fraction. Right now we have been somewhat limited in being able to proceed with that as to be perfectly honest we just have not gotten the needed testing in order to go forward with the hyperbarics. If his ejection fraction is too low which could be the case then obviously is not good to be able to Objective Constitutional Obese and well-hydrated in no acute distress. Vitals Time Taken: 1:58 PM, Height: 72 in, Weight: 304 lbs, BMI: 41.2, Temperature: 97.6 F, Pulse: 82 bpm, Respiratory Rate: 16 breaths/min, Blood Pressure: 138/87 mmHg. Respiratory normal breathing without difficulty. Psychiatric this patient is able to make decisions and demonstrates good insight into disease process. Alert and Oriented x 3. pleasant and cooperative. General Notes: Upon inspection today patient did require sharp debridement I did perform debridement clear away some of the necrotic debris he tolerated that today without complication I cleared away as much of the spongy tissue as possible. Post debridement the wound bed  appears to be doing significantly better. Integumentary (Hair, Skin) Wound #3 status is Open. Original cause of wound was Gradually Appeared. The date acquired was: 07/18/2019. The wound has been in treatment 4 weeks. The wound is located on the Right,Medial Foot. The wound measures 2cm length x 1.5cm width x 0.4cm depth; 2.356cm^2 area and 0.942cm^3 volume. There is Fat Layer (Subcutaneous Tissue) exposed. There is no tunneling or undermining noted. There is a medium amount of serosanguineous drainage noted. The wound margin is thickened. There is large (67-100%) red, pink granulation within the wound bed. There is no necrotic tissue within the wound bed. 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 There was a Excisional Skin/Subcutaneous Tissue Debridement with a total area of 3 sq cm performed by Lenda KelpStone [name], Chele Cornell, PA. With the following instrument(s): Curette to remove Viable and Non-Viable tissue/material. Material removed includes Callus, Subcutaneous Tissue, and Slough after achieving pain control using Other (benzocaine 20% spray). No specimens were taken. A time out was conducted at 14:25, prior to the start of the procedure. A Minimum amount of bleeding was controlled with Pressure. The procedure was tolerated well with a pain level of 0 throughout and a pain level of 0 following the procedure. Post Debridement Measurements: 2cm length x 1.5cm width x  0.4cm depth; 0.942cm^3 volume. Character of Wound/Ulcer Post Debridement is improved. Severity of Tissue Post Debridement is: Fat layer exposed. Post procedure Diagnosis Wound #3: Same as Pre-Procedure Plan Follow-up Appointments: Return Appointment in 1 week. Bathing/ Shower/ Hygiene: May shower with protection but do not get wound dressing(s) wet. Off-Loading: Wedge shoe to: - right foot to ambulate Additional Orders / Instructions: Stop/Decrease Smoking Follow Nutritious Diet Home Health: No change in wound care orders this week; continue Home Health for wound care. May utilize formulary equivalent dressing for wound treatment orders unless otherwise specified. Other Home Health Orders/Instructions: - [facility] The following medication(s) was prescribed: benzocaine topical 20 % aerosol aerosol topical for prior to debridement was prescribed at facility WOUND #3: - Foot Wound Laterality: Right, Medial Prim  Dressing: Hydrofera Blue Classic Foam, 4x4 in 3 x Per Week/30 Days ary Discharge Instructions: Moisten with saline prior to applying to wound,cut to fit inside wound edges Secondary Dressing: Zetuvit Plus Silicone Border Dressing 5x5 (in/in) 3 x Per Week/30 Days Discharge Instructions: Apply silicone border over primary dressing , or may use gauze and secure with rolled gauze 1. Would recommend currently that going continue with the [facility] which I feel like is doing about as good as anything at this point. 2. I am also can recommend that we continue with the border foam dressing to cover which seems to be doing decently well. 3. We will still waiting on his echocardiogram to see what the ejection fraction is whether or not he is a candidate for hyperbarics I think he would benefit from hyperbarics if we can get him in the chamber. We will see patient back for reevaluation in 1 week here in the clinic. If anything worsens or changes patient will contact our office for  additional recommendations. Electronic Signature(s) Signed: 06/03/2020 3:27:55 PM By: Lenda Kelp PA-C Entered By: Lenda Kelp on 06/03/2020 15:27:55 -------------------------------------------------------------------------------- SuperBill Details Patient Name: Date of Service: [name], RA NDO [facility] 06/03/2020 Medical Record Number: [id number] Patient Account Number: [id number] Date of Birth/Sex: Treating RN: 03/12/1970 (49 y.o. [name] Primary Care Provider: [name] Other Clinician: Referring Provider: Treating Provider/Extender: [name] in Treatment: 4 Diagnosis Coding ICD-10 Codes Code Description [phone] Type 2 diabetes mellitus with foot ulcer L97.512 Non-pressure chronic ulcer of other part of right foot with fat layer exposed E11.42 Type 2 diabetes mellitus with diabetic polyneuropathy I10 Essential (primary) hypertension I50.42 Chronic combined systolic (congestive) and diastolic (congestive) heart failure F17.218 Nicotine dependence, cigarettes, with other nicotine-induced disorders Facility Procedures CPT4 Code: 29562130 Description: 11042 - DEB SUBQ TISSUE 20 SQ CM/< ICD-10 Diagnosis Description L97.512 Non-pressure chronic ulcer of other part of right foot with fat layer exposed Modifier: Quantity: 1 Physician Procedures : CPT4 Code Description Modifier [id number] 11042 - WC PHYS SUBQ TISS 20 SQ CM ICD-10 Diagnosis Description L97.512 Non-pressure chronic ulcer of other part of right foot with fat layer exposed Quantity: 1 Electronic Signature(s) Signed: 06/03/2020 3:28:02 PM By: Lenda Kelp PA-C Entered By: Lenda Kelp on 06/03/2020 15:28:02

## 2020-06-04 DIAGNOSIS — T8189XA Other complications of procedures, not elsewhere classified, initial encounter: Secondary | ICD-10-CM | POA: Diagnosis not present

## 2020-06-04 NOTE — Progress Notes (Signed)
[name], [name] ([id number]) Visit Report for 06/03/2020 Arrival Information Details Patient Name: Date of Service: [name], RA NDO [facility] 06/03/2020 1:30 PM Medical Record Number: [id number] Patient Account Number: [id number] Date of Birth/Sex: Treating RN: 01-28-1970 (50 y.o. [name], [name] Primary Care Archit Leger: [name] Other Clinician: Referring Aeralyn Barna: Treating Ottilie Wigglesworth/Extender: [name] in Treatment: 4 Visit Information History Since Last Visit Added or deleted any medications: No Patient Arrived: Ambulatory Any new allergies or adverse reactions: No Arrival Time: 13:58 Had a fall or experienced change in No Accompanied By: self activities of daily living that may affect Transfer Assistance: None risk of falls: Patient Requires Transmission-Based Precautions: No Signs or symptoms of abuse/neglect since last visito No Patient Has Alerts: No Hospitalized since last visit: No Implantable device outside of the clinic excluding No cellular tissue based products placed in the center since last visit: Has Dressing in Place as Prescribed: Yes Has Footwear/Offloading in Place as Prescribed: Yes Right: Wedge Shoe Pain Present Now: No Electronic Signature(s) Signed: 06/04/2020 6:00:49 PM By: Shawn Stall Entered By: Shawn Stall on 06/03/2020 14:03:07 -------------------------------------------------------------------------------- Encounter Discharge Information Details Patient Name: Date of Service: [name], RA NDO [facility] 06/03/2020 1:30 PM Medical Record Number: [id number] Patient Account Number: [id number] Date of Birth/Sex: Treating RN: [birth year] (50 y.o. [name], [name] Primary Care Chike Farrington: [name] Other Clinician: Referring Erna Brossard: Treating Jonanthan Bolender/Extender: [name] in Treatment: 4 Encounter Discharge Information Items Post Procedure Vitals Discharge Condition:  Stable Temperature (F): 98.7 Ambulatory Status: Ambulatory Pulse (bpm): 74 Discharge Destination: Home Respiratory Rate (breaths/min): 17 Transportation: Private Auto Blood Pressure (mmHg): 124/77 Accompanied By: self Schedule Follow-up Appointment: Yes Clinical Summary of Care: Patient Declined Electronic Signature(s) Signed: 06/03/2020 6:38:21 PM By: Fonnie Mu RN Entered By: Fonnie Mu on 06/03/2020 14:55:33 -------------------------------------------------------------------------------- Lower Extremity Assessment Details Patient Name: Date of Service: [name], RA NDO [facility] 06/03/2020 1:30 PM Medical Record Number: [id number] Patient Account Number: [id number] Date of Birth/Sex: Treating RN: December 06, 1970 (50 y.o. [name] Primary Care Michalle Rademaker: [name] Other Clinician: Referring Andreus Cure: Treating Karess Harner/Extender: [name] Weeks in Treatment: 4 Edema Assessment Assessed: [Left: No] Franne Forts: Yes] Edema: [Left: N] [Right: o] Calf Left: Right: Point of Measurement: 34 cm From Medial Instep 44 cm Ankle Left: Right: Point of Measurement: 13 cm From Medial Instep 24.5 cm Vascular Assessment Pulses: Dorsalis Pedis Palpable: [Right:Yes] Electronic Signature(s) Signed: 06/04/2020 6:00:49 PM By: Shawn Stall Entered By: Shawn Stall on 06/03/2020 14:03:41 -------------------------------------------------------------------------------- Multi-Disciplinary Care Plan Details Patient Name: Date of Service: [name], RA NDO [facility] 06/03/2020 1:30 PM Medical Record Number: [id number] Patient Account Number: [id number] Date of Birth/Sex: Treating RN: 08-04-1970 (50 y.o. [name] Primary Care Yoshika Vensel: [name] Other Clinician: Referring Vangie Henthorn: Treating Imogen Maddalena/Extender: [name] in Treatment: 4 Multidisciplinary Care Plan reviewed with physician Active  Inactive Nutrition Nursing Diagnoses: Impaired glucose control: actual or potential Potential for alteratiion in Nutrition/Potential for imbalanced nutrition Goals: Patient/caregiver will maintain therapeutic glucose control Date Initiated: 05/13/2020 Target Resolution Date: 06/10/2020 Goal Status: Active Interventions: Assess patient nutrition upon admission and as needed per policy Treatment Activities: Patient referred to Primary Care Physician for further nutritional evaluation : 05/13/2020 Notes: Osteomyelitis Nursing Diagnoses: Infection: osteomyelitis Knowledge deficit related to disease process and management Goals: Patient's osteomyelitis will resolve Date Initiated: 05/13/2020 Target Resolution Date: 06/10/2020 Goal Status: Active Interventions: Assess for signs and symptoms of osteomyelitis resolution every visit Provide  education on osteomyelitis Treatment Activities: Systemic antibiotics : 05/13/2020 T ordered outside of clinic : 05/13/2020 est Notes: Wound/Skin Impairment Nursing Diagnoses: Impaired tissue integrity Knowledge deficit related to ulceration/compromised skin integrity Goals: Patient/caregiver will verbalize understanding of skin care regimen Date Initiated: 05/13/2020 Target Resolution Date: 06/10/2020 Goal Status: Active Ulcer/skin breakdown will have a volume reduction of 30% by week 4 Date Initiated: 05/13/2020 Target Resolution Date: 06/03/2020 Goal Status: Active Interventions: Assess patient/caregiver ability to obtain necessary supplies Assess patient/caregiver ability to perform ulcer/skin care regimen upon admission and as needed Assess ulceration(s) every visit Provide education on ulcer and skin care Treatment Activities: Skin care regimen initiated : 05/13/2020 Topical wound management initiated : 05/13/2020 Notes: Electronic Signature(s) Signed: 06/03/2020 6:10:17 PM By: [name] RN, BSN Entered By: [name] on  06/03/2020 14:20:13 -------------------------------------------------------------------------------- Pain Assessment Details Patient Name: Date of Service: [name], RA NDO [facility] 06/03/2020 1:30 PM Medical Record Number: [id number] Patient Account Number: [id number] Date of Birth/Sex: Treating RN: 1970-11-03 (49 y.o. [name] Primary Care Ginni Eichler: [name] Other Clinician: Referring Poonam Woehrle: Treating Daymon Hora/Extender: [name] in Treatment: 4 Active Problems Location of Pain Severity and Description of Pain Patient Has Paino No Site Locations Rate the pain. Current Pain Level: 0 Pain Management and Medication Current Pain Management: Medication: No Cold Application: No Rest: No Massage: No Activity: No T.E.N.S.: No Heat Application: No Leg drop or elevation: No Is the Current Pain Management Adequate: Adequate How does your wound impact your activities of daily livingo Sleep: No Bathing: No Appetite: No Relationship With Others: No Bladder Continence: No Emotions: No Bowel Continence: No Work: No Toileting: No Drive: No Dressing: No Hobbies: No Electronic Signature(s) Signed: 06/04/2020 6:00:49 PM By: Shawn Stall Entered By: Shawn Stall on 06/03/2020 14:03:30 -------------------------------------------------------------------------------- Patient/Caregiver Education Details Patient Name: Date of Service: [name], [name] 5/18/2022andnbsp1:30 PM Medical Record Number: [id number] Patient Account Number: [id number] Date of Birth/Gender: Treating RN: 1970-02-14 (49 y.o. [name] Primary Care Physician: [name] Other Clinician: Referring Physician: Treating Physician/Extender: [name] in Treatment: 4 Education Assessment Education Provided To: Patient Education Topics Provided Infection: Methods: Explain/Verbal Responses: Reinforcements needed, State  content correctly Wound/Skin Impairment: Methods: Explain/Verbal Responses: Reinforcements needed, State content correctly Electronic Signature(s) Signed: 06/03/2020 6:10:17 PM By: [name] RN, BSN Entered By: [name] on 06/03/2020 14:20:40 -------------------------------------------------------------------------------- Wound Assessment Details Patient Name: Date of Service: [name], RA NDO [facility] 06/03/2020 1:30 PM Medical Record Number: [id number] Patient Account Number: [id number] Date of Birth/Sex: Treating RN: [birth year] (49 y.o. [name], [name] Primary Care Mckaela Howley: [name] Other Clinician: Referring Elmo Rio: Treating Tabbatha Bordelon/Extender: [name] in Treatment: 4 Wound Status Wound Number: 3 Primary Diabetic Wound/Ulcer of the Lower Extremity Etiology: Wound Location: Right, Medial Foot Wound Open Wounding Event: Gradually Appeared Status: Date Acquired: 07/18/2019 Comorbid Sleep Apnea, Congestive Heart Failure, Hypertension, Type II Weeks Of Treatment: 4 History: Diabetes, Neuropathy Clustered Wound: No Photos Wound Measurements Length: (cm) 2 Width: (cm) 1.5 Depth: (cm) 0.4 Area: (cm) 2.356 Volume: (cm) 0.942 % Reduction in Area: 27.6% % Reduction in Volume: 63.8% Epithelialization: None Tunneling: No Undermining: No Wound Description Classification: Grade 3 Wound Margin: Thickened Exudate Amount: Medium Exudate Type: Serosanguineous Exudate Color: red, brown Foul Odor After Cleansing: No Slough/Fibrino No Wound Bed Granulation Amount: Large (67-100%) Exposed Structure Granulation Quality: Red, Pink Fascia Exposed: No Necrotic Amount: None Present (0%) Fat Layer (Subcutaneous Tissue) Exposed: Yes  Tendon Exposed: No Muscle Exposed: No Joint Exposed: No Bone Exposed: No Treatment Notes Wound #3 (Foot) Wound Laterality: Right, Medial Cleanser Peri-Wound Care Topical Primary Dressing Hydrofera  Blue Classic Foam, 4x4 in Discharge Instruction: Moisten with saline prior to applying to wound,cut to fit inside wound edges Secondary Dressing Zetuvit Plus Silicone Border Dressing 5x5 (in/in) Discharge Instruction: Apply silicone border over primary dressing , or may use gauze and secure with rolled gauze Secured With Compression Wrap Compression Stockings Add-Ons Electronic Signature(s) Signed: 06/04/2020 8:07:53 AM By: Karl Ito Signed: 06/04/2020 6:00:49 PM By: Shawn Stall Entered By: Karl Ito on 06/03/2020 17:02:01 -------------------------------------------------------------------------------- Vitals Details Patient Name: Date of Service: [name], RA NDO [facility] 06/03/2020 1:30 PM Medical Record Number: [id number] Patient Account Number: [id number] Date of Birth/Sex: Treating RN: October 24, 1970 (49 y.o. [name] Primary Care Mitzie Marlar: [name] Other Clinician: Referring China Deitrick: Treating Tanieka Pownall/Extender: [name] in Treatment: 4 Vital Signs Time Taken: 13:58 Temperature (F): 97.6 Height (in): 72 Pulse (bpm): 82 Weight (lbs): 304 Respiratory Rate (breaths/min): 16 Body Mass Index (BMI): 41.2 Blood Pressure (mmHg): 138/87 Reference Range: 80 - 120 mg / dl Electronic Signature(s) Signed: 06/04/2020 6:00:49 PM By: Shawn Stall Entered By: Shawn Stall on 06/03/2020 14:03:23

## 2020-06-05 DIAGNOSIS — E1169 Type 2 diabetes mellitus with other specified complication: Secondary | ICD-10-CM | POA: Diagnosis not present

## 2020-06-05 DIAGNOSIS — E11621 Type 2 diabetes mellitus with foot ulcer: Secondary | ICD-10-CM | POA: Diagnosis not present

## 2020-06-05 DIAGNOSIS — M869 Osteomyelitis, unspecified: Secondary | ICD-10-CM | POA: Diagnosis not present

## 2020-06-05 DIAGNOSIS — L97412 Non-pressure chronic ulcer of right heel and midfoot with fat layer exposed: Secondary | ICD-10-CM | POA: Diagnosis not present

## 2020-06-05 DIAGNOSIS — B9562 Methicillin resistant Staphylococcus aureus infection as the cause of diseases classified elsewhere: Secondary | ICD-10-CM | POA: Diagnosis not present

## 2020-06-05 DIAGNOSIS — B965 Pseudomonas (aeruginosa) (mallei) (pseudomallei) as the cause of diseases classified elsewhere: Secondary | ICD-10-CM | POA: Diagnosis not present

## 2020-06-08 DIAGNOSIS — E1169 Type 2 diabetes mellitus with other specified complication: Secondary | ICD-10-CM | POA: Diagnosis not present

## 2020-06-08 DIAGNOSIS — M869 Osteomyelitis, unspecified: Secondary | ICD-10-CM | POA: Diagnosis not present

## 2020-06-08 DIAGNOSIS — E11621 Type 2 diabetes mellitus with foot ulcer: Secondary | ICD-10-CM | POA: Diagnosis not present

## 2020-06-08 DIAGNOSIS — B965 Pseudomonas (aeruginosa) (mallei) (pseudomallei) as the cause of diseases classified elsewhere: Secondary | ICD-10-CM | POA: Diagnosis not present

## 2020-06-08 DIAGNOSIS — L97412 Non-pressure chronic ulcer of right heel and midfoot with fat layer exposed: Secondary | ICD-10-CM | POA: Diagnosis not present

## 2020-06-08 DIAGNOSIS — B9562 Methicillin resistant Staphylococcus aureus infection as the cause of diseases classified elsewhere: Secondary | ICD-10-CM | POA: Diagnosis not present

## 2020-06-10 ENCOUNTER — Encounter (HOSPITAL_BASED_OUTPATIENT_CLINIC_OR_DEPARTMENT_OTHER): Payer: Medicare Other | Admitting: Internal Medicine

## 2020-06-10 ENCOUNTER — Other Ambulatory Visit: Payer: Self-pay

## 2020-06-10 DIAGNOSIS — E1151 Type 2 diabetes mellitus with diabetic peripheral angiopathy without gangrene: Secondary | ICD-10-CM | POA: Diagnosis not present

## 2020-06-10 DIAGNOSIS — F17218 Nicotine dependence, cigarettes, with other nicotine-induced disorders: Secondary | ICD-10-CM | POA: Diagnosis not present

## 2020-06-10 DIAGNOSIS — Z89411 Acquired absence of right great toe: Secondary | ICD-10-CM | POA: Diagnosis not present

## 2020-06-10 DIAGNOSIS — I5042 Chronic combined systolic (congestive) and diastolic (congestive) heart failure: Secondary | ICD-10-CM | POA: Diagnosis not present

## 2020-06-10 DIAGNOSIS — E11621 Type 2 diabetes mellitus with foot ulcer: Secondary | ICD-10-CM | POA: Diagnosis not present

## 2020-06-10 DIAGNOSIS — Z8249 Family history of ischemic heart disease and other diseases of the circulatory system: Secondary | ICD-10-CM | POA: Diagnosis not present

## 2020-06-10 DIAGNOSIS — G4730 Sleep apnea, unspecified: Secondary | ICD-10-CM | POA: Diagnosis not present

## 2020-06-10 DIAGNOSIS — E1142 Type 2 diabetes mellitus with diabetic polyneuropathy: Secondary | ICD-10-CM | POA: Diagnosis not present

## 2020-06-10 DIAGNOSIS — E785 Hyperlipidemia, unspecified: Secondary | ICD-10-CM | POA: Diagnosis not present

## 2020-06-10 DIAGNOSIS — L97512 Non-pressure chronic ulcer of other part of right foot with fat layer exposed: Secondary | ICD-10-CM

## 2020-06-10 DIAGNOSIS — Z8614 Personal history of Methicillin resistant Staphylococcus aureus infection: Secondary | ICD-10-CM | POA: Diagnosis not present

## 2020-06-10 DIAGNOSIS — I110 Hypertensive heart disease with heart failure: Secondary | ICD-10-CM | POA: Diagnosis not present

## 2020-06-10 NOTE — Progress Notes (Signed)
[name], [name] ([id number]) Visit Report for 06/10/2020 Chief Complaint Document Details Patient Name: Date of Service: [name], [name] [facility] 06/10/2020 1:30 PM Medical Record Number: [id number] Patient Account Number: [id number] Date of Birth/Sex: Treating RN: March 08, 1970 (49 y.o. [name]) Boehlein, Linda Primary Care Provider: Lia HoppingHasanaj, Xaje Other Clinician: Referring Provider: Treating Provider/Extender: Harold BarbanHoffman, Shykeria Sakamoto Hasanaj, Xaje Weeks in Treatment: 5 Information Obtained from: Patient Chief Complaint Right foot ulcer Electronic Signature(s) Signed: 06/10/2020 2:31:59 PM By: Geralyn CorwinHoffman, Ehtan Delfavero DO Entered By: Geralyn CorwinHoffman, Sundus Pete on 06/10/2020 14:27:55 -------------------------------------------------------------------------------- Debridement Details Patient Name: Date of Service: Jose CrawEYNO LDS [name], [name] [facility] 06/10/2020 1:30 PM Medical Record Number: [id number] Patient Account Number: [id number] Date of Birth/Sex: Treating RN: March 08, 1970 (49 y.o. [name]) Boehlein, Linda Primary Care Provider: Lia HoppingHasanaj, Xaje Other Clinician: Referring Provider: Treating Provider/Extender: Harold BarbanHoffman, Doral Ventrella Hasanaj, Xaje Weeks in Treatment: 5 Debridement Performed for Assessment: Wound #3 Right,Medial Foot Performed By: Physician Geralyn CorwinHoffman, Oneisha Ammons, DO Debridement Type: Debridement Severity of Tissue Pre Debridement: Fat layer exposed Level of Consciousness (Pre-procedure): Awake and Alert Pre-procedure Verification/Time Out Yes - 14:15 Taken: Start Time: 14:17 Pain Control: Other : benzocaine 20% spray T Area Debrided (L x W): otal 2 (cm) x 1.5 (cm) = 3 (cm) Tissue and other material debrided: Viable, Non-Viable, Callus, Slough, Subcutaneous, Slough Level: Skin/Subcutaneous Tissue Debridement Description: Excisional Instrument: Blade, Curette, Forceps Bleeding: Minimum Hemostasis Achieved: Pressure End Time: 14:23 Procedural Pain: 0 Post Procedural Pain: 0 Response to Treatment: Procedure was  tolerated well Level of Consciousness (Post- Awake and Alert procedure): Post Debridement Measurements of Total Wound Length: (cm) 2 Width: (cm) 1.5 Depth: (cm) 0.6 Volume: (cm) 1.414 Character of Wound/Ulcer Post Debridement: Improved Severity of Tissue Post Debridement: Fat layer exposed Post Procedure Diagnosis Same as Pre-procedure Electronic Signature(s) Signed: 06/10/2020 2:31:59 PM By: Geralyn CorwinHoffman, Stephannie Broner DO Signed: 06/10/2020 4:28:15 PM By: Zenaida DeedBoehlein, Linda RN, BSN Entered By: Zenaida DeedBoehlein, Linda on 06/10/2020 14:21:11 -------------------------------------------------------------------------------- HPI Details Patient Name: Date of Service: Jose CrawEYNO LDS [name] 1:30 PM Medical Record Number: [id number] Patient Account Number: [id number] Date of Birth/Sex: Treating RN: March 08, 1970 (49 y.o. [name]) Boehlein, Linda Primary Care Provider: Lia HoppingHasanaj, Xaje Other Clinician: Referring Provider: Treating Provider/Extender: Harold BarbanHoffman, Eulogia Dismore Hasanaj, Xaje Weeks in Treatment: 5 History of Present Illness HPI Description: ADMISSION 05/13/2019 This is a 50 year old man who has type 2 diabetes with peripheral neuropathy. He has a history of wounds on the plantar aspect of his left first and fourth toes. He has had these for several months. [facility] [place] He apparently presented with swelling and an open wound at the tip of the toe. An MRI showed osteomyelitis. He was given 6 to 8 weeks of oral doxycycline again all of this via the patient we have none of these records. Since then he has been putting Goldbond on the areas wearing regular shoes. He was seen by his primary physician on 4/9 and sent down here for our review. In the primary care note it says he has been recommended for hyperbaric oxygen. Past medical history includes type 2 diabetes with peripheral neuropathy, heart failure with reduced ejection fraction 30  to 35%, peripheral arterial disease, coronary artery disease, hyperlipidemia, hypertension, syncope and a prior history of a right great toe am[place] in [place] clinic were 0.91 on the left. Also notable that in 2019 he appears to have had arterial studies that are visible in our system. At that  point the right ABI was 1.17, TBI of 0.85 with triphasic waveforms. On the left his ABI was 1.19 with a TBI of 0.73 again with triphasic waveform 5/3; we did receive some information from [facility] wound care. The patient was seen there on 1/29. At that point he had wounds on the right foot at the amputation site the fifth digit fourth digit I am presuming on the right and then an area on the left first digit although that is not specifically stated, he has had a previous amputation on the right however. It would appear him at that time most of his ulcers were on the right the left foot is stated to have a lot of scales and calluses but not a lot of open wounds. The only wounds we define when he came in here last time were on the left first and left fourth toe He also had a wound culture that showed heavy growth of staph aureus and a slight growth of Stenotrophomonas maltophilia. The doxycycline should cover the staph aureus I am not sure about the stenotrophomonas. In any case he completed 6 weeks of this. UNFORTUNATELY I still do not have a copy of the MRI. And the exact justification for hyperbarics is therefore lacking. 5/10; the patient had osteomyelitis in the left fourth toe. He was treated for 6 weeks of doxycycline this wound is healed as is the left first toe. He was sent here for hyperbaric oxygen however at this point his wounds are healed and I think a course of watchful waiting and observation is in order. If the left fourth toe reopens then it is likely he will need a more protracted and aggressive treatment approach Readmission: 05/06/20 upon evaluation today  patient presents for readmission here in the clinic exam issues with his right foot on the medial aspect at the amputation site where his great toe was this is the first metatarsal area that is affected. He has been seeing Dr. Marcha Solders who performed the surgery. With that being said currently the patient was diagnosed as having MRSA on a culture that was + March 31. Has been on IV vancomycin for 6 weeks he is now on doxycycline as the MRSA was sensitive to Doxy and he does have 2 more refills he has been on that for 3 weeks already. He has MRI of January for showed L knee first metatarsal base early osteomyelitis versus possible reactive disease but nonetheless based on what was seen it appears osteomyelitis is more likely the cause here. He has had Apligraf although to be honest that did not seem to do the job. He is also been using Dakin's solution and currently is using Aquacel. Sharp debridement has been performed by Dr. Marcha Solders on a weekly basis according to what the patient tells me. With all that being said the patient was referred to [place] for further evaluation and treatment as unfortunately he does not seem to be responding well to his treatment with the surgeon. They have considered a total contact cast it sounds like but again with the infection that this was not a good idea. The patient does have a significant past medical history for diabetes mellitus type 2 for which she is on insulin, hypertension, significant congestive heart failure with a most recent ejection fraction of 30 to 35% and that was in 2020. He has not seen his cardiologist [name] since that time. In regard to his diabetes his A1c he does not even know he tells me  his blood sugars run somewhere in the 200-300 range. With that being said he has not seen the primary care provider recently for management of this either. The patient tells me he is also a current smoker. He tells me he is trying to stop using nicotine pouches but  he just does not have enough saliva he tells me his mouth is dry all the time this is probably a subsequent issue from the diabetes as well. Unfortunately he just appears to be in a place where he is not necessarily at his healthiest even with his medical conditions. I think he would be a candidate for hyperbaric oxygen therapy but there are a lot of other things that need to be in place first before we get to that point. 05/13/2020 patient presents for 1 week follow-up. He has been using Hydrofera Blue every other day without any issues. He had to reschedule his cardiologist appointment due to transportation Issues. He has no complaints today. 05/20/2020 upon evaluation today patient appears to be doing well with regard to his foot ulcer all things considered. With that being said he tells me that he has been tolerating the dressing changes without complication. There does not appear to be any signs of active infection which is great news and overall very pleased with where things stand today. No fevers, chills, nausea, vomiting, or diarrhea. 05/27/2020 upon evaluation today patient appears to be doing well with regard to his wound on the foot. He does not require little bit of debridement today but overall he seems to be doing quite well. He actually has his echo on Monday we will see what that shows as well were hoping to be able to get him into the hyperbaric oxygen chamber but again a lot depends on what this test shows he will also need a chest x-ray if this turns out okay before going in the chamber. 06/03/2020 upon evaluation today patient's wound actually showing signs of minimal improvement. Fortunately I think that he is continuing to make great progress all things considered. He does have his echocardiogram next week. That is good news so we can see whether or not hyperbaric oxygen therapy would be appropriate for him I am hopeful we will be able to get him in the chamber sooner rather than later  but again it all depends on his ejection fraction. Right now we have been somewhat limited in being able to proceed with that as to be perfectly honest we just have not gotten the needed testing in order to go forward with the hyperbarics. If his ejection fraction is too low which could be the case then obviously is not good to be able to 5/25; patient presents for 1 week follow-up. He states he has his echo set up for next week. He overall feels well. He denies any issues over the past week. He denies signs of infection. Electronic Signature(s) Signed: 06/10/2020 2:31:59 PM By: Geralyn Corwin DO Entered By: Geralyn Corwin on 06/10/2020 14:28:25 -------------------------------------------------------------------------------- Physical Exam Details Patient Name: Date of Service: [name], [name] [facility] 06/10/2020 1:30 PM Medical Record Number: [id number] Patient Account Number: [id number] Date of Birth/Sex: Treating RN: 04-Jul-1970 (49 y.o. Jose Schooner Primary Care Provider: [name] Other Clinician: Referring Provider: Treating Provider/Extender: Harold Barban in Treatment: 5 Constitutional respirations regular, non-labored and within target range for patient.Marland Kitchen Psychiatric pleasant and cooperative. Notes Right foot: Medial aspect with open wound. There is a lot of necrotic debris that was debrided  with curette today and there was actually granulation tissue underneath. No signs of infection. Electronic Signature(s) Signed: 06/10/2020 2:31:59 PM By: Geralyn Corwin DO Entered By: Geralyn Corwin on 06/10/2020 14:29:55 -------------------------------------------------------------------------------- Physician Orders Details Patient Name: Date of Service: [name], [name] [facility] 06/10/2020 1:30 PM Medical Record Number: [id number] Patient Account Number: [id number] Date of Birth/Sex: Treating RN: [birth year] (49 y.o. Jose Schooner Primary Care  Provider: [name] Other Clinician: Referring Provider: Treating Provider/Extender: Harold Barban in Treatment: 5 Verbal / Phone Orders: No Diagnosis Coding ICD-10 Coding Code Description E11.621 Type 2 diabetes mellitus with foot ulcer L97.512 Non-pressure chronic ulcer of other part of right foot with fat layer exposed E11.42 Type 2 diabetes mellitus with diabetic polyneuropathy I10 Essential (primary) hypertension I50.42 Chronic combined systolic (congestive) and diastolic (congestive) heart failure F17.218 Nicotine dependence, cigarettes, with other nicotine-induced disorders Follow-up Appointments ppointment in 1 week. - with Leonard Schwartz Return A Bathing/ Shower/ Hygiene May shower with protection but do not get wound dressing(s) wet. Off-Loading Wedge shoe to: - right foot to ambulate Additional Orders / Instructions Stop/Decrease Smoking Follow Nutritious Diet Home Health No change in wound care orders this week; continue Home Health for wound care. May utilize formulary equivalent dressing for wound treatment orders unless otherwise specified. Other Home Health Orders/Instructions: - [facility]ound Treatment Wound #3 - Foot Wound Laterality: Right, Medial Prim Dressing: Hydrofera Blue Classic Foam, 4x4 in 3 x Per Week/30 Days ary Discharge Instructions: Moisten with saline prior to applying to wound,cut to fit inside wound edges Secondary Dressing: Zetuvit Plus Silicone Border Dressing 5x5 (in/in) 3 x Per Week/30 Days Discharge Instructions: Apply silicone border over primary dressing , or may use gauze and secure with rolled gauze Electronic Signature(s) Signed: 06/10/2020 2:31:59 PM By: Geralyn Corwin DO Entered By: Geralyn Corwin on 06/10/2020 14:30:09 -------------------------------------------------------------------------------- Problem List Details Patient Name: Date of Service: [name], [name] [facility] 06/10/2020 1:30 PM Medical  Record Number: [id number] Patient Account Number: [id number] Date of Birth/Sex: Treating RN: 1970/05/09 (49 y.o. Bayard Hugger, Bonita Quin Primary Care Provider: [name] Other Clinician: Referring Provider: Treating Provider/Extender: Harold Barban in Treatment: 5 Active Problems ICD-10 Encounter Code Description Active Date MDM Diagnosis E11.621 Type 2 diabetes mellitus with foot ulcer 05/06/2020 No Yes L97.512 Non-pressure chronic ulcer of other part of right foot with fat layer exposed 05/06/2020 No Yes E11.42 Type 2 diabetes mellitus with diabetic polyneuropathy 05/06/2020 No Yes I10 Essential (primary) hypertension 05/06/2020 No Yes I50.42 Chronic combined systolic (congestive) and diastolic (congestive) heart failure 05/06/2020 No Yes F17.218 Nicotine dependence, cigarettes, with other nicotine-induced disorders 05/06/2020 No Yes Inactive Problems Resolved Problems Electronic Signature(s) Signed: 06/10/2020 2:31:59 PM By: Geralyn Corwin DO Entered By: Geralyn Corwin on 06/10/2020 14:27:43 -------------------------------------------------------------------------------- Progress Note/History and Physical Details Patient Name: Date of Service: [name], [name] [facility] 06/10/2020 1:30 PM Medical Record Number: [id number] Patient Account Number: [id number] Date of Birth/Sex: Treating RN: 1970/12/08 (49 y.o. Jose Schooner Primary Care Provider: [name] Other Clinician: Referring Provider: Treating Provider/Extender: Harold Barban in Treatment: 5 Subjective Chief Complaint Information obtained from Patient Right foot ulcer History of Present Illness (HPI) ADMISSION 05/13/2019 This is a 50 year old man who has type 2 diabetes with peripheral neuropathy. He has a history of wounds on the plantar aspect of his left first and fourth toes. He has had these for several months. He was being seen in the wound care center at [facility] in  [facility] [place]. He apparently presented with swelling and an open wound at the tip of the toe. An MRI showed osteomyelitis. He was given 6 to 8 weeks of oral doxycycline again all of this via the patient we have none of these records. Since then he has been putting Goldbond on the areas wearing regular shoes. He was seen by his primary physician on 4/9 and sent down here for our review. In the primary care note it says he has been recommended for hyperbaric oxygen. Past medical history includes type 2 diabetes with peripheral neuropathy, heart failure with reduced ejection fraction 30 to 35%, peripheral arterial disease, coronary artery disease, hyperlipidemia, hypertension, syncope and a prior history of a right great toe amputation also by Dr. Lynden Ang in [place] ABIs in our clinic were 0.91 on the left. Also notable that in 2019 he appears to have had arterial studies that are visible in our system. At that point the right ABI was 1.17, TBI of 0.85 with triphasic waveforms. On the left his ABI was 1.19 with a TBI of 0.73 again with triphasic waveform 5/3; we did receive some information from [facility] wound care. The patient was seen there on 1/29. At that point he had wounds on the right foot at the amputation site the fifth digit fourth digit I am presuming on the right and then an area on the left first digit although that is not specifically stated, he has had a previous amputation on the right however. It would appear him at that time most of his ulcers were on the right the left foot is stated to have a lot of scales and calluses but not a lot of open wounds. The only wounds we define when he came in here last time were on the left first and left fourth toe He also had a wound culture that showed heavy growth of staph aureus and a slight growth of Stenotrophomonas maltophilia. The doxycycline should cover the staph aureus I am not sure about the stenotrophomonas. In any case he  completed 6 weeks of this. UNFORTUNATELY I still do not have a copy of the MRI. And the exact justification for hyperbarics is therefore lacking. 5/10; the patient had osteomyelitis in the left fourth toe. He was treated for 6 weeks of doxycycline this wound is healed as is the left first toe. He was sent here for hyperbaric oxygen however at this point his wounds are healed and I think a course of watchful waiting and observation is in order. If the left fourth toe reopens then it is likely he will need a more protracted and aggressive treatment approach Readmission: 05/06/20 upon evaluation today patient presents for readmission here in the clinic exam issues with his right foot on the medial aspect at the amputation site where his great toe was this is the first metatarsal area that is affected. He has been seeing Dr. Marcha Solders who performed the surgery. With that being said currently the patient was diagnosed as having MRSA on a culture that was + March 31. Has been on IV vancomycin for 6 weeks he is now on doxycycline as the MRSA was sensitive to Doxy and he does have 2 more refills he has been on that for 3 weeks already. He has MRI of January for showed L knee first metatarsal base early osteomyelitis versus possible reactive disease but nonetheless based on what was seen it appears osteomyelitis is more likely the cause here. He has had Apligraf although to  be honest that did not seem to do the job. He is also been using Dakin's solution and currently is using Aquacel. Sharp debridement has been performed by Dr. Marcha Solders on a weekly basis according to what the patient tells me. With all that being said the patient was referred to [place] for further evaluation and treatment as unfortunately he does not seem to be responding well to his treatment with the surgeon. They have considered a total contact cast it sounds like but again with the infection that this was not a good idea. The patient does have a  significant past medical history for diabetes mellitus type 2 for which she is on insulin, hypertension, significant congestive heart failure with a most recent ejection fraction of 30 to 35% and that was in 2020. He has not seen his cardiologist [name] since that time. In regard to his diabetes his A1c he does not even know he tells me his blood sugars run somewhere in the 200-300 range. With that being said he has not seen the primary care provider recently for management of this either. The patient tells me he is also a current smoker. He tells me he is trying to stop using nicotine pouches but he just does not have enough saliva he tells me his mouth is dry all the time this is probably a subsequent issue from the diabetes as well. Unfortunately he just appears to be in a place where he is not necessarily at his healthiest even with his medical conditions. I think he would be a candidate for hyperbaric oxygen therapy but there are a lot of other things that need to be in place first before we get to that point. 05/13/2020 patient presents for 1 week follow-up. He has been using Hydrofera Blue every other day without any issues. He had to reschedule his cardiologist appointment due to transportation Issues. He has no complaints today. 05/20/2020 upon evaluation today patient appears to be doing well with regard to his foot ulcer all things considered. With that being said he tells me that he has been tolerating the dressing changes without complication. There does not appear to be any signs of active infection which is great news and overall very pleased with where things stand today. No fevers, chills, nausea, vomiting, or diarrhea. 05/27/2020 upon evaluation today patient appears to be doing well with regard to his wound on the foot. He does not require little bit of debridement today but overall he seems to be doing quite well. He actually has his echo on Monday we will see what that shows as well  were hoping to be able to get him into the hyperbaric oxygen chamber but again a lot depends on what this test shows he will also need a chest x-ray if this turns out okay before going in the chamber. 06/03/2020 upon evaluation today patient's wound actually showing signs of minimal improvement. Fortunately I think that he is continuing to make great progress all things considered. He does have his echocardiogram next week. That is good news so we can see whether or not hyperbaric oxygen therapy would be appropriate for him I am hopeful we will be able to get him in the chamber sooner rather than later but again it all depends on his ejection fraction. Right now we have been somewhat limited in being able to proceed with that as to be perfectly honest we just have not gotten the needed testing in order to go forward with the hyperbarics.  If his ejection fraction is too low which could be the case then obviously is not good to be able to 5/25; patient presents for 1 week follow-up. He states he has his echo set up for next week. He overall feels well. He denies any issues over the past week. He denies signs of infection. Patient History Information obtained from Patient. Family History Cancer, Hypertension - Father, Lung Disease - Paternal Grandparents, No family history of Diabetes, Heart Disease, Hereditary Spherocytosis, Kidney Disease, Seizures, Stroke, Thyroid Problems, Tuberculosis. Social History Current every day smoker - 1 PPD, Marital Status - Separated, Alcohol Use - Rarely, Drug Use - No History, Caffeine Use - Daily - Coffee. Medical History Eyes Denies history of Cataracts, Glaucoma, Optic Neuritis Ear/Nose/Mouth/Throat Denies history of Chronic sinus problems/congestion, Middle ear problems Hematologic/Lymphatic Denies history of Anemia, Hemophilia, Human Immunodeficiency Virus, Lymphedema, Sickle Cell Disease Respiratory Patient has history of Sleep Apnea Denies history of  Aspiration, Asthma, Chronic Obstructive Pulmonary Disease (COPD), Pneumothorax, Tuberculosis Cardiovascular Patient has history of Congestive Heart Failure, Hypertension Denies history of Angina, Arrhythmia, Coronary Artery Disease, Deep Vein Thrombosis, Hypotension, Myocardial Infarction, Peripheral Arterial Disease, Peripheral Venous Disease, Phlebitis, Vasculitis Gastrointestinal Denies history of Cirrhosis , Colitis, Crohnoos, Hepatitis A, Hepatitis B, Hepatitis C Endocrine Patient has history of Type II Diabetes Denies history of Type I Diabetes Genitourinary Denies history of End Stage Renal Disease Immunological Denies history of Lupus Erythematosus, Raynaudoos, Scleroderma Integumentary (Skin) Denies history of History of Burn Musculoskeletal Denies history of Gout, Rheumatoid Arthritis, Osteoarthritis, Osteomyelitis Neurologic Patient has history of Neuropathy Denies history of Dementia, Quadriplegia, Paraplegia, Seizure Disorder Oncologic Denies history of Received Chemotherapy, Received Radiation Psychiatric Denies history of Anorexia/bulimia, Confinement Anxiety Patient is treated with Insulin, Oral Agents. Blood sugar is not tested. Objective Constitutional respirations regular, non-labored and within target range for patient.. Vitals Time Taken: 1:31 PM, Height: 72 in, Weight: 304 lbs, BMI: 41.2, Temperature: 98.2 F, Pulse: 74 bpm, Respiratory Rate: 17 breaths/min, Blood Pressure: 139/93 mmHg. Psychiatric pleasant and cooperative. General Notes: Right foot: Medial aspect with open wound. There is a lot of necrotic debris that was debrided with curette today and there was actually granulation tissue underneath. No signs of infection. Integumentary (Hair, Skin) Wound #3 status is Open. Original cause of wound was Gradually Appeared. The date acquired was: 07/18/2019. The wound has been in treatment 5 weeks. The wound is located on the Right,Medial Foot. The wound  measures 2cm length x 1.5cm width x 0.6cm depth; 2.356cm^2 area and 1.414cm^3 volume. There is Fat Layer (Subcutaneous Tissue) exposed. There is no tunneling or undermining noted. There is a medium amount of serosanguineous drainage noted. Foul odor after cleansing was noted. The wound margin is thickened. There is small (1-33%) red, pink granulation within the wound bed. There is a large (67-100%) amount of necrotic tissue within the wound bed including Eschar and Adherent Slough. Assessment Active Problems ICD-10 Type 2 diabetes mellitus with foot ulcer Non-pressure chronic ulcer of other part of right foot with fat layer exposed Type 2 diabetes mellitus with diabetic polyneuropathy Essential (primary) hypertension Chronic combined systolic (congestive) and diastolic (congestive) heart failure Nicotine dependence, cigarettes, with other nicotine-induced disorders Patient's wound has shown improvement in size and appearance. Necrotic tissue was debrided and there was some healthy granulation tissue underneath. No signs of infection. We will continue with Hydrofera Blue. He has an appointment for his echocardiogram next week. This is the last thing he needs to see if he qualifies for hyperbaric oxygen for  his current Right foot wound. Procedures Wound #3 Pre-procedure diagnosis of Wound #3 is a Diabetic Wound/Ulcer of the Lower Extremity located on the Right,Medial Foot .Severity of Tissue Pre Debridement is: Fat layer exposed. There was a Excisional Skin/Subcutaneous Tissue Debridement with a total area of 3 sq cm performed by Geralyn Corwin, DO. With the following instrument(s): Blade, Curette, and Forceps to remove Viable and Non-Viable tissue/material. Material removed includes Callus, Subcutaneous Tissue, and Slough after achieving pain control using Other (benzocaine 20% spray). No specimens were taken. A time out was conducted at 14:15, prior to the start of the procedure. A Minimum  amount of bleeding was controlled with Pressure. The procedure was tolerated well with a pain level of 0 throughout and a pain level of 0 following the procedure. Post Debridement Measurements: 2cm length x 1.5cm width x 0.6cm depth; 1.414cm^3 volume. Character of Wound/Ulcer Post Debridement is improved. Severity of Tissue Post Debridement is: Fat layer exposed. Post procedure Diagnosis Wound #3: Same as Pre-Procedure Plan Follow-up Appointments: Return Appointment in 1 week. - with Luana Shu Shower/ Hygiene: May shower with protection but do not get wound dressing(s) wet. Off-Loading: Wedge shoe to: - right foot to ambulate Additional Orders / Instructions: Stop/Decrease Smoking Follow Nutritious Diet Home Health: No change in wound care orders this week; continue Home Health for wound care. May utilize formulary equivalent dressing for wound treatment orders unless otherwise specified. Other Home Health Orders/Instructions: - [facility] WOUND #3: - Foot Wound Laterality: Right, Medial Prim Dressing: Hydrofera Blue Classic Foam, 4x4 in 3 x Per Week/30 Days ary Discharge Instructions: Moisten with saline prior to applying to wound,cut to fit inside wound edges Secondary Dressing: Zetuvit Plus Silicone Border Dressing 5x5 (in/in) 3 x Per Week/30 Days Discharge Instructions: Apply silicone border over primary dressing , or may use gauze and secure with rolled gauze 1. Hydrofera Blue 2. In office sharp debridement 3. Follow-up in 1 week Electronic Signature(s) Signed: 06/10/2020 2:31:59 PM By: Geralyn Corwin DO Entered By: Geralyn Corwin on 06/10/2020 14:31:32 -------------------------------------------------------------------------------- HxROS Details Patient Name: Date of Service: [name], [name] [facility] 06/10/2020 1:30 PM Medical Record Number: [id number] Patient Account Number: [id number] Date of Birth/Sex: Treating RN: [birth year] (49 y.o. Jose Schooner Primary  Care Provider: [name] Other Clinician: Referring Provider: Treating Provider/Extender: Harold Barban in Treatment: 5 Label Progress Note Print Version as History and Physical for this encounter Information Obtained From Patient Eyes Medical History: Negative for: Cataracts; Glaucoma; Optic Neuritis Ear/Nose/Mouth/Throat Medical History: Negative for: Chronic sinus problems/congestion; Middle ear problems Hematologic/Lymphatic Medical History: Negative for: Anemia; Hemophilia; Human Immunodeficiency Virus; Lymphedema; Sickle Cell Disease Respiratory Medical History: Positive for: Sleep Apnea Negative for: Aspiration; Asthma; Chronic Obstructive Pulmonary Disease (COPD); Pneumothorax; Tuberculosis Cardiovascular Medical History: Positive for: Congestive Heart Failure; Hypertension Negative for: Angina; Arrhythmia; Coronary Artery Disease; Deep Vein Thrombosis; Hypotension; Myocardial Infarction; Peripheral Arterial Disease; Peripheral Venous Disease; Phlebitis; Vasculitis Gastrointestinal Medical History: Negative for: Cirrhosis ; Colitis; Crohns; Hepatitis A; Hepatitis B; Hepatitis C Endocrine Medical History: Positive for: Type II Diabetes Negative for: Type I Diabetes Time with diabetes: 13 years Treated with: Insulin, Oral agents Blood sugar tested every day: No Genitourinary Medical History: Negative for: End Stage Renal Disease Immunological Medical History: Negative for: Lupus Erythematosus; Raynauds; Scleroderma Integumentary (Skin) Medical History: Negative for: History of Burn Musculoskeletal Medical History: Negative for: Gout; Rheumatoid Arthritis; Osteoarthritis; Osteomyelitis Neurologic Medical History: Positive for: Neuropathy Negative for: Dementia; Quadriplegia; Paraplegia; Seizure Disorder Oncologic Medical History: Negative  for: Received Chemotherapy; Received Radiation Psychiatric Medical History: Negative for:  Anorexia/bulimia; Confinement Anxiety Immunizations Pneumococcal Vaccine: Received Pneumococcal Vaccination: No Implantable Devices None Family and Social History Cancer: Yes; Diabetes: No; Heart Disease: No; Hereditary Spherocytosis: No; Hypertension: Yes - Father; Kidney Disease: No; Lung Disease: Yes - Paternal Grandparents; Seizures: No; Stroke: No; Thyroid Problems: No; Tuberculosis: No; Current every day smoker - 1 PPD; Marital Status - Separated; Alcohol Use: Rarely; Drug Use: No History; Caffeine Use: Daily - Coffee; Financial Concerns: No; Food, Clothing or Shelter Needs: No; Support System Lacking: No; Transportation Concerns: No Electronic Signature(s) Signed: 06/10/2020 2:31:59 PM By: Geralyn Corwin DO Signed: 06/10/2020 4:28:15 PM By: [name] RN, BSN Entered By: Geralyn Corwin on 06/10/2020 14:28:31 -------------------------------------------------------------------------------- SuperBill Details Patient Name: Date of Service: [name], [name] [facility] 06/10/2020 Medical Record Number: [id number] Patient Account Number: [id number] Date of Birth/Sex: Treating RN: [birth year] (49 y.o. Jose Schooner Primary Care Provider: [name] Other Clinician: Referring Provider: Treating Provider/Extender: Harold Barban in Treatment: 5 Diagnosis Coding ICD-10 Codes Code Description [id number] Type 2 diabetes mellitus with foot ulcer L97.512 Non-pressure chronic ulcer of other part of right foot with fat layer exposed E11.42 Type 2 diabetes mellitus with diabetic polyneuropathy I10 Essential (primary) hypertension I50.42 Chronic combined systolic (congestive) and diastolic (congestive) heart failure F17.218 Nicotine dependence, cigarettes, with other nicotine-induced disorders Facility Procedures CPT4 Code: [id number] 11 I Description: 042 - DEB SUBQ TISSUE 20 SQ CM/< CD-10 Diagnosis Description L97.512 Non-pressure chronic ulcer of other part  of right foot with fat layer exposed Modifier: 1 Quantity: Physician Procedures : CPT4 Code Description Modifier [id number] - WC PHYS SUBQ TISS 20 SQ CM ICD-10 Diagnosis Description L97.512 Non-pressure chronic ulcer of other part of right foot with fat layer exposed Quantity: 1 Electronic Signature(s) Signed: 06/10/2020 2:31:59 PM By: Geralyn Corwin DO Entered By: Geralyn Corwin on 06/10/2020 14:31:38

## 2020-06-11 DIAGNOSIS — M869 Osteomyelitis, unspecified: Secondary | ICD-10-CM | POA: Diagnosis not present

## 2020-06-11 DIAGNOSIS — B9562 Methicillin resistant Staphylococcus aureus infection as the cause of diseases classified elsewhere: Secondary | ICD-10-CM | POA: Diagnosis not present

## 2020-06-11 DIAGNOSIS — B965 Pseudomonas (aeruginosa) (mallei) (pseudomallei) as the cause of diseases classified elsewhere: Secondary | ICD-10-CM | POA: Diagnosis not present

## 2020-06-11 DIAGNOSIS — L97412 Non-pressure chronic ulcer of right heel and midfoot with fat layer exposed: Secondary | ICD-10-CM | POA: Diagnosis not present

## 2020-06-11 DIAGNOSIS — E1169 Type 2 diabetes mellitus with other specified complication: Secondary | ICD-10-CM | POA: Diagnosis not present

## 2020-06-11 DIAGNOSIS — E11621 Type 2 diabetes mellitus with foot ulcer: Secondary | ICD-10-CM | POA: Diagnosis not present

## 2020-06-16 DIAGNOSIS — E1169 Type 2 diabetes mellitus with other specified complication: Secondary | ICD-10-CM | POA: Diagnosis not present

## 2020-06-16 DIAGNOSIS — B965 Pseudomonas (aeruginosa) (mallei) (pseudomallei) as the cause of diseases classified elsewhere: Secondary | ICD-10-CM | POA: Diagnosis not present

## 2020-06-16 DIAGNOSIS — I517 Cardiomegaly: Secondary | ICD-10-CM | POA: Diagnosis not present

## 2020-06-16 DIAGNOSIS — I371 Nonrheumatic pulmonary valve insufficiency: Secondary | ICD-10-CM | POA: Diagnosis not present

## 2020-06-16 DIAGNOSIS — I5189 Other ill-defined heart diseases: Secondary | ICD-10-CM | POA: Diagnosis not present

## 2020-06-16 DIAGNOSIS — E11621 Type 2 diabetes mellitus with foot ulcer: Secondary | ICD-10-CM | POA: Diagnosis not present

## 2020-06-16 DIAGNOSIS — L97412 Non-pressure chronic ulcer of right heel and midfoot with fat layer exposed: Secondary | ICD-10-CM | POA: Diagnosis not present

## 2020-06-16 DIAGNOSIS — I5022 Chronic systolic (congestive) heart failure: Secondary | ICD-10-CM | POA: Diagnosis not present

## 2020-06-16 DIAGNOSIS — B9562 Methicillin resistant Staphylococcus aureus infection as the cause of diseases classified elsewhere: Secondary | ICD-10-CM | POA: Diagnosis not present

## 2020-06-16 DIAGNOSIS — M869 Osteomyelitis, unspecified: Secondary | ICD-10-CM | POA: Diagnosis not present

## 2020-06-16 NOTE — Progress Notes (Signed)
[name], [name] ([id number]) Visit Report for 06/10/2020 Arrival Information Details Patient Name: Date of Service: [name], RA NDO [facility] 06/10/2020 1:30 PM Medical Record Number: [id number] Patient Account Number: [id number] Date of Birth/Sex: Treating RN: 01-27-1970 (50 y.o. Burnadette Pop, [name] Primary Care Malak Orantes: Stoney Bang Other Clinician: Referring Shylyn Younce: Treating Chakira Jachim/Extender: Metta Clines in Treatment: 5 Visit Information History Since Last Visit Added or deleted any medications: No Patient Arrived: Ambulatory Any new allergies or adverse reactions: No Arrival Time: 13:31 Had a fall or experienced change in No Accompanied By: self activities of daily living that may affect Transfer Assistance: None risk of falls: Patient Identification Verified: Yes Signs or symptoms of abuse/neglect since last visito No Secondary Verification Process Completed: Yes Hospitalized since last visit: No Patient Requires Transmission-Based Precautions: No Implantable device outside of the clinic excluding No Patient Has Alerts: No cellular tissue based products placed in the center since last visit: Has Dressing in Place as Prescribed: Yes Pain Present Now: Yes Electronic Signature(s) Signed: 06/10/2020 4:26:30 PM By: Rhae Hammock RN Entered By: Rhae Hammock on 06/10/2020 13:31:48 -------------------------------------------------------------------------------- Encounter Discharge Information Details Patient Name: Date of Service: [name], RA NDO [facility] 06/10/2020 1:30 PM Medical Record Number: [id number] Patient Account Number: [id number] Date of Birth/Sex: Treating RN: July 13, 1970 (50 y.o. Burnadette Pop, [name] Primary Care Ranny Wiebelhaus: Stoney Bang Other Clinician: Referring Loui Massenburg: Treating Avangeline Stockburger/Extender: Metta Clines in Treatment: 5 Encounter Discharge Information Items Post Procedure  Vitals Discharge Condition: Stable Temperature (F): 98.4 Ambulatory Status: Ambulatory Pulse (bpm): 74 Discharge Destination: Home Respiratory Rate (breaths/min): 17 Transportation: Private Auto Blood Pressure (mmHg): 137/74 Accompanied By: self Schedule Follow-up Appointment: Yes Clinical Summary of Care: Patient Declined Electronic Signature(s) Signed: 06/10/2020 4:26:30 PM By: Rhae Hammock RN Entered By: Rhae Hammock on 06/10/2020 15:02:23 -------------------------------------------------------------------------------- Lower Extremity Assessment Details Patient Name: Date of Service: [name], RA NDO [facility] 06/10/2020 1:30 PM Medical Record Number: [id number] Patient Account Number: [id number] Date of Birth/Sex: Treating RN: 01-Nov-1970 (50 y.o. Burnadette Pop, [name] Primary Care Flem Enderle: Stoney Bang Other Clinician: Referring Kayler Buckholtz: Treating Ezzie Senat/Extender: Arliss Journey Weeks in Treatment: 5 Edema Assessment Assessed: Shirlyn Goltz: No] Patrice Paradise: Yes] Edema: [Left: N] [Right: o] Calf Left: Right: Point of Measurement: 34 cm From Medial Instep 42.5 cm Ankle Left: Right: Point of Measurement: 13 cm From Medial Instep 24.5 cm Vascular Assessment Pulses: Dorsalis Pedis Palpable: [Right:Yes] Posterior Tibial Palpable: [Right:Yes] Electronic Signature(s) Signed: 06/10/2020 4:26:30 PM By: Rhae Hammock RN Entered By: Rhae Hammock on 06/10/2020 13:38:47 -------------------------------------------------------------------------------- Multi Wound Chart Details Patient Name: Date of Service: [name], RA NDO [facility] 06/10/2020 1:30 PM Medical Record Number: [id number] Patient Account Number: [id number] Date of Birth/Sex: Treating RN: 1970/06/18 (50 y.o. [name] Primary Care Keiran Sias: Stoney Bang Other Clinician: Referring Joh Rao: Treating Reather Steller/Extender: Metta Clines in Treatment: 5 Vital  Signs Height(in): 72 Pulse(bpm): 74 Weight(lbs): 304 Blood Pressure(mmHg): 139/93 Body Mass Index(BMI): 41 Temperature(F): 98.2 Respiratory Rate(breaths/min): 17 Photos: [3:No Photos Right, Medial Foot] [N/A:N/A N/A] Wound Location: [3:Gradually Appeared] [N/A:N/A] Wounding Event: [3:Diabetic Wound/Ulcer of the Lower] [N/A:N/A] Primary Etiology: [3:Extremity Sleep Apnea, Congestive Heart] [N/A:N/A] Comorbid History: [3:Failure, Hypertension, Type II Diabetes, Neuropathy 07/18/2019] [N/A:N/A] Date Acquired: [3:5] [N/A:N/A] Weeks of Treatment: [3:Open] [N/A:N/A] Wound Status: [3:2x1.5x0.6] [N/A:N/A] Measurements L x W x D (cm) [3:2.356] [N/A:N/A] A (cm) : rea [3:1.414] [N/A:N/A] Volume (cm) : [3:27.60%] [N/A:N/A] % Reduction in A rea: [3:45.60%] [N/A:N/A] % Reduction in Volume: [3:Grade 3] [N/A:N/A] Classification: [  3:Medium] [N/A:N/A] Exudate A mount: [3:Serosanguineous] [N/A:N/A] Exudate Type: [3:red, brown] [N/A:N/A] Exudate Color: [3:Yes] [N/A:N/A] Foul Odor A Cleansing: [3:fter No] [N/A:N/A] Odor A nticipated Due to Product Use: [3:Thickened] [N/A:N/A] Wound Margin: [3:Small (1-33%)] [N/A:N/A] Granulation A mount: [3:Red, Pink] [N/A:N/A] Granulation Quality: [3:Large (67-100%)] [N/A:N/A] Necrotic A mount: [3:Eschar, Adherent Slough] [N/A:N/A] Necrotic Tissue: [3:Fat Layer (Subcutaneous Tissue): Yes N/A] Exposed Structures: [3:Fascia: No Tendon: No Muscle: No Joint: No Bone: No None] [N/A:N/A] Epithelialization: [3:Debridement - Excisional] [N/A:N/A] Debridement: Pre-procedure Verification/Time Out 14:15 [N/A:N/A] Taken: [3:Other] [N/A:N/A] Pain Control: [3:Callus, Subcutaneous, Slough] [N/A:N/A] Tissue Debrided: [3:Skin/Subcutaneous Tissue] [N/A:N/A] Level: [3:3] [N/A:N/A] Debridement A (sq cm): [3:rea Blade, Curette, Forceps] [N/A:N/A] Instrument: [3:Minimum] [N/A:N/A] Bleeding: [3:Pressure] [N/A:N/A] Hemostasis A chieved: [3:0] [N/A:N/A] Procedural Pain: [3:0]  [N/A:N/A] Post Procedural Pain: [3:Procedure was tolerated well] [N/A:N/A] Debridement Treatment Response: [3:2x1.5x0.6] [N/A:N/A] Post Debridement Measurements L x W x D (cm) [3:1.414] [N/A:N/A] Post Debridement Volume: (cm) [3:Debridement] [N/A:N/A] Treatment Notes Electronic Signature(s) Signed: 06/10/2020 2:31:59 PM By: Kalman Shan DO Signed: 06/10/2020 4:28:15 PM By: Baruch Gouty RN, BSN Entered By: Kalman Shan on 06/10/2020 14:27:48 -------------------------------------------------------------------------------- Multi-Disciplinary Care Plan Details Patient Name: Date of Service: [name], RA NDO [facility] 06/10/2020 1:30 PM Medical Record Number: [id number] Patient Account Number: [id number] Date of Birth/Sex: Treating RN: [birth year] (50 y.o. [name] Primary Care Provider: Stoney Bang Other Clinician: Referring Provider: Treating Provider/Extender: Metta Clines in Treatment: 5 Multidisciplinary Care Plan reviewed with physician Active Inactive Nutrition Nursing Diagnoses: Impaired glucose control: actual or potential Potential for alteratiion in Nutrition/Potential for imbalanced nutrition Goals: Patient/caregiver will maintain therapeutic glucose control Date Initiated: 05/13/2020 Target Resolution Date: 07/08/2020 Goal Status: Active Interventions: Assess patient nutrition upon admission and as needed per policy Treatment Activities: Patient referred to Primary Care Physician for further nutritional evaluation : 05/13/2020 Notes: Osteomyelitis Nursing Diagnoses: Infection: osteomyelitis Knowledge deficit related to disease process and management Goals: Patient's osteomyelitis will resolve Date Initiated: 05/13/2020 Target Resolution Date: 07/08/2020 Goal Status: Active Interventions: Assess for signs and symptoms of osteomyelitis resolution every visit Provide education on osteomyelitis Treatment  Activities: Systemic antibiotics : 05/13/2020 T ordered outside of clinic : 05/13/2020 est Notes: Wound/Skin Impairment Nursing Diagnoses: Impaired tissue integrity Knowledge deficit related to ulceration/compromised skin integrity Goals: Patient/caregiver will verbalize understanding of skin care regimen Date Initiated: 05/13/2020 Target Resolution Date: 07/08/2020 Goal Status: Active Ulcer/skin breakdown will have a volume reduction of 30% by week 4 Date Initiated: 05/13/2020 Date Inactivated: 06/10/2020 Target Resolution Date: 06/03/2020 Goal Status: Met Ulcer/skin breakdown will have a volume reduction of 50% by week 8 Date Initiated: 06/10/2020 Target Resolution Date: 07/08/2020 Goal Status: Active Interventions: Assess patient/caregiver ability to obtain necessary supplies Assess patient/caregiver ability to perform ulcer/skin care regimen upon admission and as needed Assess ulceration(s) every visit Provide education on ulcer and skin care Treatment Activities: Skin care regimen initiated : 05/13/2020 Topical wound management initiated : 05/13/2020 Notes: Electronic Signature(s) Signed: 06/10/2020 4:28:15 PM By: Baruch Gouty RN, BSN Entered By: Baruch Gouty on 06/10/2020 13:33:24 -------------------------------------------------------------------------------- Pain Assessment Details Patient Name: Date of Service: [name], RA NDO [facility] 06/10/2020 1:30 PM Medical Record Number: [id number] Patient Account Number: [id number] Date of Birth/Sex: Treating RN: [birth year] (50 y.o. Burnadette Pop, [name] Primary Care Provider: Stoney Bang Other Clinician: Referring Provider: Treating Provider/Extender: Metta Clines in Treatment: 5 Active Problems Location of Pain Severity and Description of Pain Patient Has Paino Yes Site Locations Pain Location: Generalized Pain, Pain in Ulcers With Dressing Change: Yes  Duration of the Pain. Constant /  Intermittento Intermittent Rate the pain. Current Pain Level: 6 Worst Pain Level: 10 Least Pain Level: 0 Tolerable Pain Level: 6 Character of Pain Describe the Pain: Aching Pain Management and Medication Current Pain Management: Medication: Yes Cold Application: No Rest: Yes Massage: No Activity: No T.E.N.S.: No Heat Application: No Leg drop or elevation: No Is the Current Pain Management Adequate: Adequate How does your wound impact your activities of daily livingo Sleep: No Bathing: No Appetite: No Relationship With Others: No Bladder Continence: No Emotions: No Bowel Continence: No Work: No Toileting: No Drive: No Dressing: No Hobbies: No Electronic Signature(s) Signed: 06/10/2020 4:26:30 PM By: Rhae Hammock RN Entered By: Rhae Hammock on 06/10/2020 13:32:41 -------------------------------------------------------------------------------- Patient/Caregiver Education Details Patient Name: Date of Service: [name], [name] 5/25/2022andnbsp1:30 PM Medical Record Number: [id number] Patient Account Number: [id number] Date of Birth/Gender: Treating RN: [birth year] (50 y.o. [name] Primary Care Physician: Stoney Bang Other Clinician: Referring Physician: Treating Physician/Extender: Metta Clines in Treatment: 5 Education Assessment Education Provided To: Patient Education Topics Provided Infection: Methods: Explain/Verbal Responses: Reinforcements needed, State content correctly Wound/Skin Impairment: Methods: Explain/Verbal Responses: Reinforcements needed, State content correctly Electronic Signature(s) Signed: 06/10/2020 4:28:15 PM By: Baruch Gouty RN, BSN Entered By: Baruch Gouty on 06/10/2020 13:33:51 -------------------------------------------------------------------------------- Wound Assessment Details Patient Name: Date of Service: [name], RA NDO [facility] 06/10/2020 1:30 PM Medical Record  Number: [id number] Patient Account Number: [id number] Date of Birth/Sex: Treating RN: 06/22/1970 (50 y.o. Burnadette Pop, [name] Primary Care Provider: Stoney Bang Other Clinician: Referring Provider: Treating Provider/Extender: Arliss Journey Weeks in Treatment: 5 Wound Status Wound Number: 3 Primary Diabetic Wound/Ulcer of the Lower Extremity Etiology: Wound Location: Right, Medial Foot Wound Open Wounding Event: Gradually Appeared Status: Date Acquired: 07/18/2019 Comorbid Sleep Apnea, Congestive Heart Failure, Hypertension, Type II Weeks Of Treatment: 5 History: Diabetes, Neuropathy Clustered Wound: No Photos Wound Measurements Length: (cm) 2 Width: (cm) 1.5 Depth: (cm) 0.6 Area: (cm) 2.356 Volume: (cm) 1.414 % Reduction in Area: 27.6% % Reduction in Volume: 45.6% Epithelialization: None Tunneling: No Undermining: No Wound Description Classification: Grade 3 Wound Margin: Thickened Exudate Amount: Medium Exudate Type: Serosanguineous Exudate Color: red, brown Foul Odor After Cleansing: Yes Due to Product Use: No Slough/Fibrino Yes Wound Bed Granulation Amount: Small (1-33%) Exposed Structure Granulation Quality: Red, Pink Fascia Exposed: No Necrotic Amount: Large (67-100%) Fat Layer (Subcutaneous Tissue) Exposed: Yes Necrotic Quality: Eschar, Adherent Slough Tendon Exposed: No Muscle Exposed: No Joint Exposed: No Bone Exposed: No Treatment Notes Wound #3 (Foot) Wound Laterality: Right, Medial Cleanser Peri-Wound Care Topical Primary Dressing Hydrofera Blue Classic Foam, 4x4 in Discharge Instruction: Moisten with saline prior to applying to wound,cut to fit inside wound edges Secondary Dressing Zetuvit Plus Silicone Border Dressing 5x5 (in/in) Discharge Instruction: Apply silicone border over primary dressing , or may use gauze and secure with rolled gauze Secured With Compression Wrap Compression Stockings Add-Ons Electronic  Signature(s) Signed: 06/10/2020 4:26:30 PM By: Rhae Hammock RN Signed: 06/16/2020 5:24:49 PM By: Sandre Kitty Entered By: Sandre Kitty on 06/10/2020 15:34:22 -------------------------------------------------------------------------------- Vitals Details Patient Name: Date of Service: [name], RA NDO [facility] 06/10/2020 1:30 PM Medical Record Number: [id number] Patient Account Number: [id number] Date of Birth/Sex: Treating RN: 08-27-1970 (50 y.o. Erie Noe Primary Care Provider: Stoney Bang Other Clinician: Referring Provider: Treating Provider/Extender: Metta Clines in Treatment: 5 Vital Signs Time Taken: 13:31 Temperature (F): 98.2 Height (in): 72 Pulse (bpm): 74  Weight (lbs): 304 Respiratory Rate (breaths/min): 17 Body Mass Index (BMI): 41.2 Blood Pressure (mmHg): 139/93 Reference Range: 80 - 120 mg / dl Electronic Signature(s) Signed: 06/10/2020 4:26:30 PM By: Rhae Hammock RN Entered By: Rhae Hammock on 06/10/2020 13:32:07

## 2020-06-17 ENCOUNTER — Encounter (HOSPITAL_BASED_OUTPATIENT_CLINIC_OR_DEPARTMENT_OTHER): Payer: Medicare Other | Attending: Physician Assistant | Admitting: Physician Assistant

## 2020-06-17 ENCOUNTER — Other Ambulatory Visit: Payer: Self-pay

## 2020-06-17 DIAGNOSIS — I5042 Chronic combined systolic (congestive) and diastolic (congestive) heart failure: Secondary | ICD-10-CM | POA: Diagnosis not present

## 2020-06-17 DIAGNOSIS — L97529 Non-pressure chronic ulcer of other part of left foot with unspecified severity: Secondary | ICD-10-CM | POA: Diagnosis not present

## 2020-06-17 DIAGNOSIS — E1151 Type 2 diabetes mellitus with diabetic peripheral angiopathy without gangrene: Secondary | ICD-10-CM | POA: Diagnosis not present

## 2020-06-17 DIAGNOSIS — Z89411 Acquired absence of right great toe: Secondary | ICD-10-CM | POA: Diagnosis not present

## 2020-06-17 DIAGNOSIS — E11621 Type 2 diabetes mellitus with foot ulcer: Secondary | ICD-10-CM | POA: Diagnosis not present

## 2020-06-17 DIAGNOSIS — I110 Hypertensive heart disease with heart failure: Secondary | ICD-10-CM | POA: Diagnosis not present

## 2020-06-17 DIAGNOSIS — Z794 Long term (current) use of insulin: Secondary | ICD-10-CM | POA: Diagnosis not present

## 2020-06-17 DIAGNOSIS — E1142 Type 2 diabetes mellitus with diabetic polyneuropathy: Secondary | ICD-10-CM | POA: Insufficient documentation

## 2020-06-17 DIAGNOSIS — F17218 Nicotine dependence, cigarettes, with other nicotine-induced disorders: Secondary | ICD-10-CM | POA: Diagnosis not present

## 2020-06-17 DIAGNOSIS — L97512 Non-pressure chronic ulcer of other part of right foot with fat layer exposed: Secondary | ICD-10-CM | POA: Diagnosis not present

## 2020-06-17 NOTE — Progress Notes (Addendum)
[name], [name] ([id number]) Visit Report for 06/17/2020 Chief Complaint Document Details Patient Name: Date of Service: [name], [name] [facility] 06/17/2020 3:45 PM Medical Record Number: [id number] Patient Account Number: [id number] Date of Birth/Sex: Treating RN: [birth year] (49 y.o. [name] Primary Care Provider: [name] Other Clinician: Referring Provider: Treating Provider/Extender: [name] in Treatment: 6 Information Obtained from: Patient Chief Complaint Right foot ulcer Electronic Signature(s) Signed: 06/17/2020 4:18:42 PM By: Lenda Kelp PA-C Entered By: Lenda Kelp on 06/17/2020 16:18:42 -------------------------------------------------------------------------------- Debridement Details Patient Name: Date of Service: [name], [name] [facility] 06/17/2020 3:45 PM Medical Record Number: [id number] Patient Account Number: [id number] Date of Birth/Sex: Treating RN: [birth year] (49 y.o. [name] Primary Care Provider: [name] Other Clinician: Referring Provider: Treating Provider/Extender: [name] in Treatment: 6 Debridement Performed for Assessment: Wound #3 Right,Medial Foot Performed By: Physician Lenda Kelp, PA Debridement Type: Debridement Severity of Tissue Pre Debridement: Fat layer exposed Level of Consciousness (Pre-procedure): Awake and Alert Pre-procedure Verification/Time Out Yes - 16:50 Taken: Start Time: 16:51 T Area Debrided (L x W): otal 1.9 (cm) x 1.2 (cm) = 2.28 (cm) Tissue and other material debrided: Viable, Non-Viable, Callus, Slough, Subcutaneous, Slough Level: Skin/Subcutaneous Tissue Debridement Description: Excisional Instrument: Curette Specimen: Tissue Culture Number of Specimens T aken: 1 Bleeding: Minimum Hemostasis Achieved: Pressure End Time: 17:02 Procedural Pain: 0 Post Procedural Pain: 0 Response to Treatment: Procedure was tolerated  well Level of Consciousness (Post- Awake and Alert procedure): Post Debridement Measurements of Total Wound Length: (cm) 1.9 Width: (cm) 1.2 Depth: (cm) 0.6 Volume: (cm) 1.074 Character of Wound/Ulcer Post Debridement: Improved Severity of Tissue Post Debridement: Fat layer exposed Post Procedure Diagnosis Same as Pre-procedure Electronic Signature(s) Signed: 06/17/2020 5:35:00 PM By: Lenda Kelp PA-C Signed: 06/17/2020 6:06:42 PM By: [name] RN, BSN Entered By: [name] on 06/17/2020 17:04:44 -------------------------------------------------------------------------------- HPI Details Patient Name: Date of Service: [name], [name] [facility] 06/17/2020 3:45 PM Medical Record Number: [id number] Patient Account Number: [id number] Date of Birth/Sex: Treating RN: 29-Jan-1970 (49 y.o. [name] Primary Care Provider: [name] Other Clinician: Referring Provider: Treating Provider/Extender: [name] in Treatment: 6 History of Present Illness HPI Description: ADMISSION 05/13/2019 This is a 50 year old man who has type 2 diabetes with peripheral neuropathy. He has a history of wounds on the plantar aspect of his left first and fourth toes. He has had these for several months. He was being seen in the wound care center at [facility] in [facility]. He apparently presented with swelling and an open wound at the tip of the toe. An MRI showed osteomyelitis. He was given 6 to 8 weeks of oral doxycycline again all of this via the patient we have none of these records. Since then he has been putting Goldbond on the areas wearing regular shoes. He was seen by his primary physician on 4/9 and sent down here for our review. In the primary care note it says he has been recommended for hyperbaric oxygen. Past medical history includes type 2 diabetes with peripheral neuropathy, heart failure with reduced ejection fraction 30 to 35%,  peripheral arterial disease, coronary artery disease, hyperlipidemia, hypertension, syncope and a prior history of a right great toe amputation also by Dr. Lynden Ang in [place] ABIs in our clinic were 0.91 on the left. Also notable that in 2019 he appears to have had arterial studies that  are visible in our system. At that point the right ABI was 1.17, TBI of 0.85 with triphasic waveforms. On the left his ABI was 1.19 with a TBI of 0.73 again with triphasic waveform 5/3; we did receive some information from [facility] wound care. The patient was seen there on 1/29. At that point he had wounds on the right foot at the amputation site the fifth digit fourth digit I am presuming on the right and then an area on the left first digit although that is not specifically stated, he has had a previous amputation on the right however. It would appear him at that time most of his ulcers were on the right the left foot is stated to have a lot of scales and calluses but not a lot of open wounds. The only wounds we define when he came in here last time were on the left first and left fourth toe He also had a wound culture that showed heavy growth of staph aureus and a slight growth of Stenotrophomonas maltophilia. The doxycycline should cover the staph aureus I am not sure about the stenotrophomonas. In any case he completed 6 weeks of this. UNFORTUNATELY I still do not have a copy of the MRI. And the exact justification for hyperbarics is therefore lacking. 5/10; the patient had osteomyelitis in the left fourth toe. He was treated for 6 weeks of doxycycline this wound is healed as is the left first toe. He was sent here for hyperbaric oxygen however at this point his wounds are healed and I think a course of watchful waiting and observation is in order. If the left fourth toe reopens then it is likely he will need a more protracted and aggressive treatment approach Readmission: 05/06/20 upon evaluation today patient  presents for readmission here in the clinic exam issues with his right foot on the medial aspect at the amputation site where his great toe was this is the first metatarsal area that is affected. He has been seeing Dr. Marcha Solders who performed the surgery. With that being said currently the patient was diagnosed as having MRSA on a culture that was + March 31. Has been on IV vancomycin for 6 weeks he is now on doxycycline as the MRSA was sensitive to Doxy and he does have 2 more refills he has been on that for 3 weeks already. He has MRI of January for showed L knee first metatarsal base early osteomyelitis versus possible reactive disease but nonetheless based on what was seen it appears osteomyelitis is more likely the cause here. He has had Apligraf although to be honest that did not seem to do the job. He is also been using Dakin's solution and currently is using Aquacel. Sharp debridement has been performed by Dr. Marcha Solders on a weekly basis according to what the patient tells me. With all that being said the patient was referred to [place] for further evaluation and treatment as unfortunately he does not seem to be responding well to his treatment with the surgeon. They have considered a total contact cast it sounds like but again with the infection that this was not a good idea. The patient does have a significant past medical history for diabetes mellitus type 2 for which she is on insulin, hypertension, significant congestive heart failure with a most recent ejection fraction of 30 to 35% and that was in 2020. He has not seen his cardiologist [name] since that time. In regard to his diabetes his A1c he  does not even know he tells me his blood sugars run somewhere in the 200-300 range. With that being said he has not seen the primary care provider recently for management of this either. The patient tells me he is also a current smoker. He tells me he is trying to stop using nicotine pouches but he just  does not have enough saliva he tells me his mouth is dry all the time this is probably a subsequent issue from the diabetes as well. Unfortunately he just appears to be in a place where he is not necessarily at his healthiest even with his medical conditions. I think he would be a candidate for hyperbaric oxygen therapy but there are a lot of other things that need to be in place first before we get to that point. 05/13/2020 patient presents for 1 week follow-up. He has been using Hydrofera Blue every other day without any issues. He had to reschedule his cardiologist appointment due to transportation Issues. He has no complaints today. 05/20/2020 upon evaluation today patient appears to be doing well with regard to his foot ulcer all things considered. With that being said he tells me that he has been tolerating the dressing changes without complication. There does not appear to be any signs of active infection which is great news and overall very pleased with where things stand today. No fevers, chills, nausea, vomiting, or diarrhea. 05/27/2020 upon evaluation today patient appears to be doing well with regard to his wound on the foot. He does not require little bit of debridement today but overall he seems to be doing quite well. He actually has his echo on Monday we will see what that shows as well were hoping to be able to get him into the hyperbaric oxygen chamber but again a lot depends on what this test shows he will also need a chest x-ray if this turns out okay before going in the chamber. 06/03/2020 upon evaluation today patient's wound actually showing signs of minimal improvement. Fortunately I think that he is continuing to make great progress all things considered. He does have his echocardiogram next week. That is good news so we can see whether or not hyperbaric oxygen therapy would be appropriate for him I am hopeful we will be able to get him in the chamber sooner rather than later but  again it all depends on his ejection fraction. Right now we have been somewhat limited in being able to proceed with that as to be perfectly honest we just have not gotten the needed testing in order to go forward with the hyperbarics. If his ejection fraction is too low which could be the case then obviously is not good to be able to 5/25; patient presents for 1 week follow-up. He states he has his echo set up for next week. He overall feels well. He denies any issues over the past week. He denies signs of infection. 06/17/2020 upon evaluation today patient appears to be doing about the same in regard to his foot ulcer is measuring a little bit smaller today but still keeps developing the spongy tissue. I really think it may be a good idea for [place] to obtain a sample of a punch biopsy to send for evaluation. He is in agreement with this plan. Subsequently he did have his echocardiogram which showed that he has a systolic left ventricular function which is moderately decreased estimated to be 30 to 35%. He does have a follow-up appointment with the cardiologist on  Monday to see what they think about this. I advised that he needs to talk to them as well that what they feel about him potentially going into hyperbaric oxygen therapy if his heart would be sufficient for this or not. Obviously if they feel that it is something that would be sufficient then we need to have this in writing. Otherwise patient tells me that he has been doing fairly well is not really having any significant pain. Electronic Signature(s) Signed: 06/17/2020 5:18:07 PM By: Lenda Kelp PA-C Entered By: Lenda Kelp on 06/17/2020 17:18:06 -------------------------------------------------------------------------------- Physical Exam Details Patient Name: Date of Service: [name], [name] [facility] 06/17/2020 3:45 PM Medical Record Number: [id number] Patient Account Number: [id number] Date of Birth/Sex: Treating RN: 1970-10-19 (49  y.o. [name] Primary Care Provider: [name] Other Clinician: Referring Provider: Treating Provider/Extender: [name] in Treatment: 6 Constitutional Well-nourished and well-hydrated in no acute distress. Respiratory normal breathing without difficulty. Psychiatric this patient is able to make decisions and demonstrates good insight into disease process. Alert and Oriented x 3. pleasant and cooperative. Notes Upon inspection patient's wound bed actually showed signs of good granulation epithelization at this point. There does not appear to be any evidence of infection which is great news. I did perform sharp debridement clearly some of the spongy and discolored tissue. Subsequently I did obtain a 3 mm punch biopsy once I cleaned this away to get the best sample possible of the tissue in order to evaluate and see if there is any signs of abnormality here. Again I am not strongly suspicious for any type of cancer but nonetheless it is at least something that is on the radar screen. Electronic Signature(s) Signed: 06/17/2020 5:18:45 PM By: Lenda Kelp PA-C Entered By: Lenda Kelp on 06/17/2020 17:18:45 -------------------------------------------------------------------------------- Physician Orders Details Patient Name: Date of Service: [name], [name] [facility] 06/17/2020 3:45 PM Medical Record Number: [id number] Patient Account Number: [id number] Date of Birth/Sex: Treating RN: Jul 11, 1970 (49 y.o. [name] Primary Care Provider: [name] Other Clinician: Referring Provider: Treating Provider/Extender: [name] in Treatment: 6 Verbal / Phone Orders: No Diagnosis Coding ICD-10 Coding Code Description E11.621 Type 2 diabetes mellitus with foot ulcer L97.512 Non-pressure chronic ulcer of other part of right foot with fat layer exposed E11.42 Type 2 diabetes mellitus with diabetic  polyneuropathy I10 Essential (primary) hypertension I50.42 Chronic combined systolic (congestive) and diastolic (congestive) heart failure F17.218 Nicotine dependence, cigarettes, with other nicotine-induced disorders Follow-up Appointments ppointment in 1 week. - with Leonard Schwartz Return A Bathing/ Shower/ Hygiene May shower with protection but do not get wound dressing(s) wet. Off-Loading Wedge shoe to: - right foot to ambulate Additional Orders / Instructions Stop/Decrease Smoking Follow Nutritious Diet Home Health No change in wound care orders this week; continue Home Health for wound care. May utilize formulary equivalent dressing for wound treatment orders unless otherwise specified. Other Home Health Orders/Instructions: - [facility] Wound Treatment Wound #3 - Foot Wound Laterality: Right, Medial Prim Dressing: Hydrofera Blue Classic Foam, 4x4 in 3 x Per Week/30 Days ary Discharge Instructions: Moisten with saline prior to applying to wound,cut to fit inside wound edges Secondary Dressing: Zetuvit Plus Silicone Border Dressing 5x5 (in/in) 3 x Per Week/30 Days Discharge Instructions: Apply silicone border over primary dressing , or may use gauze and secure with rolled gauze Laboratory Bacteria identified in Tissue by Biopsy culture (MICRO) LOINC Code: 78295-6 Convenience Name: Biopsy specimen  culture Electronic Signature(s) Signed: 06/17/2020 5:35:00 PM By: Lenda KelpStone [name], Bao Coreas PA-C Signed: 06/17/2020 6:06:42 PM By: Zenaida DeedBoehlein, Linda RN, BSN Entered By: Zenaida DeedBoehlein, Linda on 06/17/2020 17:06:24 -------------------------------------------------------------------------------- Problem List Details Patient Name: Date of Service: Jose CrawEYNO LDS [name], [name] 06/17/2020 3:45 PM Medical Record Number: [id number] Patient Account Number: [id number] Date of Birth/Sex: Treating RN: 1970/11/24 (49 y.o. Bayard HuggerM) Boehlein, Bonita QuinLinda Primary Care Provider: Lia HoppingHasanaj, Xaje Other Clinician: Referring Provider: Treating  Provider/Extender: Laurann MontanaStone [name], Annalisse Minkoff Hasanaj, Xaje Weeks in Treatment: 6 Active Problems ICD-10 Encounter Code Description Active Date MDM Diagnosis E11.621 Type 2 diabetes mellitus with foot ulcer 05/06/2020 No Yes L97.512 Non-pressure chronic ulcer of other part of right foot with fat layer exposed 05/06/2020 No Yes E11.42 Type 2 diabetes mellitus with diabetic polyneuropathy 05/06/2020 No Yes I10 Essential (primary) hypertension 05/06/2020 No Yes I50.42 Chronic combined systolic (congestive) and diastolic (congestive) heart failure 05/06/2020 No Yes F17.218 Nicotine dependence, cigarettes, with other nicotine-induced disorders 05/06/2020 No Yes Inactive Problems Resolved Problems Electronic Signature(s) Signed: 06/17/2020 4:18:36 PM By: Lenda KelpStone [name], Issa Luster PA-C Entered By: Lenda KelpStone [name], Ziare Cryder on 06/17/2020 16:18:36 -------------------------------------------------------------------------------- Progress Note Details Patient Name: Date of Service: Jose CrawEYNO LDS [name] 3:45 PM Medical Record Number: [id number] Patient Account Number: [id number] Date of Birth/Sex: Treating RN: 1970/11/24 (49 y.o. Damaris SchoonerM) Boehlein, Linda Primary Care Provider: Lia HoppingHasanaj, Xaje Other Clinician: Referring Provider: Treating Provider/Extender: Laurann MontanaStone [name], Lea Walbert Hasanaj, Xaje Weeks in Treatment: 6 Subjective Chief Complaint Information obtained from Patient Right foot ulcer History of Present Illness (HPI) ADMISSION 05/13/2019 This is a 50 year old man who has type 2 diabetes with peripheral neuropathy. He has a history of wounds on the plantar aspect of his left first and fourth toes. He has had these for several months. He [facility] He apparently presented with swelling and an open wound at the tip of the toe. An MRI showed osteomyelitis. He was given 6 to 8 weeks of oral doxycycline again all of this via the patient we have none of these  records. Since then he has been putting Goldbond on the areas wearing regular shoes. He was seen by his primary physician on 4/9 and sent down here for our review. In the primary care note it says he has been recommended for hyperbaric oxygen. Past medical history includes type 2 diabetes with peripheral neuropathy, heart failure with reduced ejection fraction 30 to 35%, peripheral arterial disease, coronary artery disease, hyperlipidemia, hypertension, syncope and a prior history of a right great toe amputation also by Dr. Lyn[place] ABIs in our clinic were 0.91 on the left. Also notable that in 2019 he appears to have had arterial studies that are visible in our system. At that point the right ABI was 1.17, TBI of 0.85 with triphasic waveforms. On the left his ABI was 1.19 with a TBI of 0.73 again with triphasic waveform 5/3; we [facility]. The patient was seen there on 1/29. At that point he had wounds on the right foot at the amputation site the fifth digit fourth digit I am presuming on the right and then an area on the left first digit although that is not specifically stated, he has had a previous amputation on the right however. It would appear him at that time most of his ulcers were on the right the left foot is stated to have a lot of scales and calluses but  not a lot of open wounds. The only wounds we define when he came in here last time were on the left first and left fourth toe He also had a wound culture that showed heavy growth of staph aureus and a slight growth of Stenotrophomonas maltophilia. The doxycycline should cover the staph aureus I am not sure about the stenotrophomonas. In any case he completed 6 weeks of this. UNFORTUNATELY I still do not have a copy of the MRI. And the exact justification for hyperbarics is therefore lacking. 5/10; the patient had osteomyelitis in the left fourth toe. He was treated for 6 weeks of  doxycycline this wound is healed as is the left first toe. He was sent here for hyperbaric oxygen however at this point his wounds are healed and I think a course of watchful waiting and observation is in order. If the left fourth toe reopens then it is likely he will need a more protracted and aggressive treatment approach Readmission: 05/06/20 upon evaluation today patient presents for readmission here in the clinic exam issues with his right foot on the medial aspect at the amputation site where his great toe was this is the first metatarsal area that is affected. He has been seeing Dr. Marcha Solders who performed the surgery. With that being said currently the patient was diagnosed as having MRSA on a culture that was + March 31. Has been on IV vancomycin for 6 weeks he is now on doxycycline as the MRSA was sensitive to Doxy and he does have 2 more refills he has been on that for 3 weeks already. He has MRI of January for showed L knee first metatarsal base early osteomyelitis versus possible reactive disease but nonetheless based on what was seen it appears osteomyelitis is more likely the cause here. He has had Apligraf although to be honest that did not seem to do the job. He is also been using Dakin's solution and currently is using Aquacel. Sharp debridement has been performed by Dr. Marcha Solders on a weekly basis according to what the patient tells me. With all that being said the patient was referred to [place] for further evaluation and treatment as unfortunately he does not seem to be responding well to his treatment with the surgeon. They have considered a total contact cast it sounds like but again with the infection that this was not a good idea. The patient does have a significant past medical history for diabetes mellitus type 2 for which she is on insulin, hypertension, significant congestive heart failure with a most recent ejection fraction of 30 to 35% and that was in 2020. He has not seen his  cardiologist [name] since that time. In regard to his diabetes his A1c he does not even know he tells me his blood sugars run somewhere in the 200-300 range. With that being said he has not seen the primary care provider recently for management of this either. The patient tells me he is also a current smoker. He tells me he is trying to stop using nicotine pouches but he just does not have enough saliva he tells me his mouth is dry all the time this is probably a subsequent issue from the diabetes as well. Unfortunately he just appears to be in a place where he is not necessarily at his healthiest even with his medical conditions. I think he would be a candidate for hyperbaric oxygen therapy but there are a lot of other things that need to be in place  first before we get to that point. 05/13/2020 patient presents for 1 week follow-up. He has been using Hydrofera Blue every other day without any issues. He had to reschedule his cardiologist appointment due to transportation Issues. He has no complaints today. 05/20/2020 upon evaluation today patient appears to be doing well with regard to his foot ulcer all things considered. With that being said he tells me that he has been tolerating the dressing changes without complication. There does not appear to be any signs of active infection which is great news and overall very pleased with where things stand today. No fevers, chills, nausea, vomiting, or diarrhea. 05/27/2020 upon evaluation today patient appears to be doing well with regard to his wound on the foot. He does not require little bit of debridement today but overall he seems to be doing quite well. He actually has his echo on Monday we will see what that shows as well were hoping to be able to get him into the hyperbaric oxygen chamber but again a lot depends on what this test shows he will also need a chest x-ray if this turns out okay before going in the chamber. 06/03/2020 upon evaluation today  patient's wound actually showing signs of minimal improvement. Fortunately I think that he is continuing to make great progress all things considered. He does have his echocardiogram next week. That is good news so we can see whether or not hyperbaric oxygen therapy would be appropriate for him I am hopeful we will be able to get him in the chamber sooner rather than later but again it all depends on his ejection fraction. Right now we have been somewhat limited in being able to proceed with that as to be perfectly honest we just have not gotten the needed testing in order to go forward with the hyperbarics. If his ejection fraction is too low which could be the case then obviously is not good to be able to 5/25; patient presents for 1 week follow-up. He states he has his echo set up for next week. He overall feels well. He denies any issues over the past week. He denies signs of infection. 06/17/2020 upon evaluation today patient appears to be doing about the same in regard to his foot ulcer is measuring a little bit smaller today but still keeps developing the spongy tissue. I really think it may be a good idea for [place] to obtain a sample of a punch biopsy to send for evaluation. He is in agreement with this plan. Subsequently he did have his echocardiogram which showed that he has a systolic left ventricular function which is moderately decreased estimated to be 30 to 35%. He does have a follow-up appointment with the cardiologist on Monday to see what they think about this. I advised that he needs to talk to them as well that what they feel about him potentially going into hyperbaric oxygen therapy if his heart would be sufficient for this or not. Obviously if they feel that it is something that would be sufficient then we need to have this in writing. Otherwise patient tells me that he has been doing fairly well is not really having any significant pain. Objective Constitutional Well-nourished and  well-hydrated in no acute distress. Vitals Time Taken: 4:40 PM, Height: 72 in, Weight: 304 lbs, BMI: 41.2, Temperature: 98.2 F, Pulse: 83 bpm, Respiratory Rate: 17 breaths/min, Blood Pressure: 104/63 mmHg. Respiratory normal breathing without difficulty. Psychiatric this patient is able to make decisions and demonstrates good  insight into disease process. Alert and Oriented x 3. pleasant and cooperative. General Notes: Upon inspection patient's wound bed actually showed signs of good granulation epithelization at this point. There does not appear to be any evidence of infection which is great news. I did perform sharp debridement clearly some of the spongy and discolored tissue. Subsequently I did obtain a 3 mm punch biopsy once I cleaned this away to get the best sample possible of the tissue in order to evaluate and see if there is any signs of abnormality here. Again I am not strongly suspicious for any type of cancer but nonetheless it is at least something that is on the radar screen. Integumentary (Hair, Skin) Wound #3 status is Open. Original cause of wound was Gradually Appeared. The date acquired was: 07/18/2019. The wound has been in treatment 6 weeks. The wound is located on the Right,Medial Foot. The wound measures 1.9cm length x 1.2cm width x 0.6cm depth; 1.791cm^2 area and 1.074cm^3 volume. There is Fat Layer (Subcutaneous Tissue) exposed. There is no tunneling or undermining noted. There is a medium amount of serosanguineous drainage noted. Foul odor after cleansing was noted. The wound margin is thickened. There is small (1-33%) red, pink granulation within the wound bed. There is a large (67-100%) amount of necrotic tissue within the wound bed including Eschar and Adherent Slough. Assessment Active Problems ICD-10 Type 2 diabetes mellitus with foot ulcer Non-pressure chronic ulcer of other part of right foot with fat layer exposed Type 2 diabetes mellitus with diabetic  polyneuropathy Essential (primary) hypertension Chronic combined systolic (congestive) and diastolic (congestive) heart failure Nicotine dependence, cigarettes, with other nicotine-induced disorders Procedures Wound #3 Pre-procedure diagnosis of Wound #3 is a Diabetic Wound/Ulcer of the Lower Extremity located on the Right,Medial Foot .Severity of Tissue Pre Debridement is: Fat layer exposed. There was a Excisional Skin/Subcutaneous Tissue Debridement with a total area of 2.28 sq cm performed by Lenda Kelp, PA. With the following instrument(s): Curette to remove Viable and Non-Viable tissue/material. Material removed includes Callus, Subcutaneous Tissue, and Slough. 1 specimen was taken by a Tissue Culture and sent to the lab per facility protocol. A time out was conducted at 16:50, prior to the start of the procedure. A Minimum amount of bleeding was controlled with Pressure. The procedure was tolerated well with a pain level of 0 throughout and a pain level of 0 following the procedure. Post Debridement Measurements: 1.9cm length x 1.2cm width x 0.6cm depth; 1.074cm^3 volume. Character of Wound/Ulcer Post Debridement is improved. Severity of Tissue Post Debridement is: Fat layer exposed. Post procedure Diagnosis Wound #3: Same as Pre-Procedure Plan Follow-up Appointments: Return Appointment in 1 week. - with Luana Shu Shower/ Hygiene: May shower with protection but do not get wound dressing(s) wet. Off-Loading: Wedge shoe to: - right foot to ambulate Additional Orders / Instructions: Stop/Decrease Smoking Follow Nutritious Diet Home Health: No change in wound care orders this week; continue Home Health for wound care. May utilize formulary equivalent dressing for wound treatment orders unless otherwise specified. Other Home Health Orders/Instructions: - [facility] ordered were: Biopsy specimen right foot WOUND #3: - Foot Wound Laterality: Right, Medial Prim  Dressing: Hydrofera Blue Classic Foam, 4x4 in 3 x Per Week/30 Days ary Discharge Instructions: Moisten with saline prior to applying to wound,cut to fit inside wound edges Secondary Dressing: Zetuvit Plus Silicone Border Dressing 5x5 (in/in) 3 x Per Week/30 Days Discharge Instructions: Apply silicone border over primary dressing , or may use gauze and  secure with rolled gauze 1. Would recommend currently that we go ahead and continue with the wound care measures as before and the patient is in agreement with plan. Specifically this includes the use of the [facility] dressing which I think is done well for months at this point. 2. I am also can recommend at this time that we have the patient go ahead and continue to be monitored ongoing for the possibility of hyperbaric oxygen therapy. I think this is still something that is potentially on the table but again he would need cardiac clearance for sure and again his ejection fraction is somewhat questionable. 3. I am going to see what the results of the punch biopsy show up in regard to the sample that was taken today. We will make any adjustments in therapy as needed depending on the results of that. We will see patient back for reevaluation in 1 week here in the clinic. If anything worsens or changes patient will contact our office for additional recommendations. Electronic Signature(s) Signed: 06/17/2020 5:19:42 PM By: Lenda Kelp PA-C Entered By: Lenda Kelp on 06/17/2020 17:19:42 -------------------------------------------------------------------------------- SuperBill Details Patient Name: Date of Service: [name], [name] [facility] 06/17/2020 Medical Record Number: [id number] Patient Account Number: [id number] Date of Birth/Sex: Treating RN: 1970/07/25 (49 y.o. [name] Primary Care Provider: [name] Other Clinician: Referring Provider: Treating Provider/Extender: [name] in  Treatment: 6 Diagnosis Coding ICD-10 Codes Code Description [phone] Type 2 diabetes mellitus with foot ulcer L97.512 Non-pressure chronic ulcer of other part of right foot with fat layer exposed E11.42 Type 2 diabetes mellitus with diabetic polyneuropathy I10 Essential (primary) hypertension I50.42 Chronic combined systolic (congestive) and diastolic (congestive) heart failure F17.218 Nicotine dependence, cigarettes, with other nicotine-induced disorders Facility Procedures CPT4 Code: [id number] Description: 11042 - DEB SUBQ TISSUE 20 SQ CM/< ICD-10 Diagnosis Description L97.512 Non-pressure chronic ulcer of other part of right foot with fat layer exposed Modifier: Quantity: 1 Physician Procedures : CPT4 Code Description Modifier [id number] - WC PHYS LEVEL 4 - EST PT ICD-10 Diagnosis Description E11.621 Type 2 diabetes mellitus with foot ulcer L97.512 Non-pressure chronic ulcer of other part of right foot with fat layer exposed E11.42 Type 2  diabetes mellitus with diabetic polyneuropathy I10 Essential (primary) hypertension Quantity: 1 : [id number] 11042 - WC PHYS SUBQ TISS 20 SQ CM ICD-10 Diagnosis Description L97.512 Non-pressure chronic ulcer of other part of right foot with fat layer exposed Quantity: 1 Electronic Signature(s) Signed: 06/17/2020 5:20:01 PM By: Lenda Kelp PA-C Entered By: Lenda Kelp on 06/17/2020 17:20:01

## 2020-06-19 DIAGNOSIS — B965 Pseudomonas (aeruginosa) (mallei) (pseudomallei) as the cause of diseases classified elsewhere: Secondary | ICD-10-CM | POA: Diagnosis not present

## 2020-06-19 DIAGNOSIS — L97412 Non-pressure chronic ulcer of right heel and midfoot with fat layer exposed: Secondary | ICD-10-CM | POA: Diagnosis not present

## 2020-06-19 DIAGNOSIS — B9562 Methicillin resistant Staphylococcus aureus infection as the cause of diseases classified elsewhere: Secondary | ICD-10-CM | POA: Diagnosis not present

## 2020-06-19 DIAGNOSIS — E11621 Type 2 diabetes mellitus with foot ulcer: Secondary | ICD-10-CM | POA: Diagnosis not present

## 2020-06-19 DIAGNOSIS — M869 Osteomyelitis, unspecified: Secondary | ICD-10-CM | POA: Diagnosis not present

## 2020-06-19 DIAGNOSIS — E1169 Type 2 diabetes mellitus with other specified complication: Secondary | ICD-10-CM | POA: Diagnosis not present

## 2020-06-21 NOTE — Progress Notes (Addendum)
Cardiology Office Note  Date: 06/22/2020   ID: [name], DOB 01/04/1971, MRN [id number]  PCP:  Toma Deiters, MD  Cardiologist:  Dina Rich, MD Electrophysiologist:  None   Chief Complaint: Follow up   History of Present Illness: [name] is a 50 y.o. male with a history of Chronic systolic heart failure.   She was last seen by [name], PA-C via telemedicine 01/29/2019.  He denied any anginal or exertional symptoms, DOE, orthopnea, PND, or edema.  Initially had fatigue after starting an Entresto but this had resolved.  He denied any lightheadedness or dizziness or presyncope.  His current medication regimen was Toprol-XL 50 mg daily, Entresto 24-26 mg p.o. twice daily.  Blood pressure was 111/78.  He was encouraged to keep a BP log for the next 2 to 3 weeks and report back with readings.  Pending blood pressure trends would try adding spironolactone as he may not tolerate increasing Entresto dose.  Plan was for repeat echocardiogram 3 months following optimization of medical therapy.  He was continuing aspirin 81 mg, simvastatin 20 mg daily, and Toprol-XL 50 mg daily for CAD.  He is here for follow-up on chronic systolic heart failure.  His PCP recently ordered a follow-up echocardiogram which was performed at [facility] on 05/29/2020.  He continues with a EF of 30 to 35%, G2 DD, trivial MR.  He is undergoing wound care for a nonhealing ulcer on his right foot.  He will soon start hyperbaric O2 therapy to help with healing.  He has poorly controlled diabetes with peripheral neuropathy.  He states his insulin dosage was recently increased by his PCP for better control of his blood sugars.  He denies any DOE or SOB.  He is not very active on a daily basis due to his wound on right foot.  Blood pressure is elevated at 142/82 today.  He states his blood pressure has been elevated recently at PCP office also with BP of 140/80 on 05/11/2020 office visit.   States he was recently started on Farxiga by his primary care provider.  He continues to smoke/vape.  He states he is primarily vaping now versus smoking.  Patient states he is here for clearance to undergo hyperbaric oxygen therapy.  Past Medical History:  Diagnosis Date  . Angina pectoris with documented spasm (HCC)   . CAD (coronary artery disease)    a. cath in 07/2017 showing occluded LCx and occluded RCA filled by collaterals with consideration of PCI to CTO RCA and LCx if myocardium viable --> did not follow-up after cath  . CHF (congestive heart failure) (HCC)    a. EF 30-35% in 07/2017 b. similar results by repeat echo in 04/2018  . Corns and callosities   . Diabetes mellitus without complication (HCC)   . Hyperlipidemia   . Hypertension   . Other sleep apnea   . Pancreatitis     Past Surgical History:  Procedure Laterality Date  . APPENDECTOMY  2016  . OTHER SURGICAL HISTORY  2016  . RIGHT/LEFT HEART CATH AND CORONARY ANGIOGRAPHY N/A 08/08/2017   Procedure: RIGHT/LEFT HEART CATH AND CORONARY ANGIOGRAPHY;  Surgeon: [name], MD;  Location: [facility] INVASIVE CV LAB;  Service: Cardiovascular;  Laterality: N/A;    Current Outpatient Medications  Medication Sig Dispense Refill  . aspirin EC 81 MG tablet Take 1 tablet (81 mg total) by mouth daily. 90 tablet 3  . DULoxetine (CYMBALTA) 60 MG capsule Take 60 mg  by mouth daily.     Marland Kitchen. FLUoxetine (PROZAC) 20 MG capsule Take 20 mg by mouth every morning.    . gabapentin (NEURONTIN) 300 MG capsule Take 300 mg by mouth 4 (four) times daily.    . insulin lispro (HUMALOG) 100 UNIT/ML injection Inject 15 Units into the skin 3 (three) times daily before meals.    . Insulin Pen Needle (EXEL COMFORT POINT PEN NEEDLE) 31G X 6 MM MISC USE ONCE DAILY TO INJECT INSULIN    . LEVEMIR FLEXTOUCH 100 UNIT/ML Pen Inject 50 Units into the skin 2 (two) times daily. Dependant upon blood sugar level  2  . metFORMIN (GLUCOPHAGE) 1000 MG tablet Take  1,000 mg by mouth 2 (two) times daily with a meal.    . metoprolol succinate (TOPROL-XL) 50 MG 24 hr tablet TAKE ONE TABLET BY MOUTH DAILY. TAKE WITH OR IMMEDIATELY FOLLOWING A MEAL. 90 tablet 0  . Omega-3 Fatty Acids (FISH OIL) 1000 MG CAPS Take 1 capsule by mouth 3 (three) times daily.    . sacubitril-valsartan (ENTRESTO) 49-51 MG Take 1 tablet by mouth 2 (two) times daily. 60 tablet 6  . simvastatin (ZOCOR) 20 MG tablet Take 20 mg by mouth daily.     No current facility-administered medications for this visit.   Allergies:  Patient has no known allergies.   Social History: The patient  reports that he quit smoking about 3 years ago. His smoking use included e-cigarettes. He has never used smokeless tobacco. He reports that he does not drink alcohol and does not use drugs.   Family History: The patient's family history includes Aneurysm in his father.   ROS:  Please see the history of present illness. Otherwise, complete review of systems is positive for none.  All other systems are reviewed and negative.   Physical Exam: VS:  BP (!) 142/82   Pulse 76   Ht 6' (1.829 m)   Wt (!) 302 lb 3.2 oz (137.1 kg)   SpO2 98%   BMI 40.99 kg/m , BMI Body mass index is 40.99 kg/m.  Wt Readings from Last 3 Encounters:  06/22/20 (!) 302 lb 3.2 oz (137.1 kg)  01/29/19 296 lb (134.3 kg)  12/24/18 296 lb (134.3 kg)    General: Morbidly obese patient appears comfortable at rest. Neck: Supple, no elevated JVP or carotid bruits, no thyromegaly. Lungs: Clear to auscultation, nonlabored breathing at rest. Cardiac: Regular rate and rhythm, no S3 or significant systolic murmur, no pericardial rub. Extremities: No pitting edema, distal pulses 2+. Skin: Warm and dry. Musculoskeletal: No kyphosis. Neuropsychiatric: Alert and oriented x3, affect grossly appropriate.  ECG: 06/22/2020 EKG normal sinus rhythm rate of 73, ST/T wave abnormality consider lateral ischemia.  Recent Labwork: No results found for  requested labs within last 8760 hours.  No results found for: CHOL, TRIG, HDL, CHOLHDL, VLDL, LDLCALC, LDLDIRECT  Other Studies Reviewed Today:  Echocardiogram 06/16/2020 [facility] Summary 1. Technically difficult study. 2. The left ventricle is mildly to moderately dilated in size with mildly increased wall thickness. 3. The left ventricular systolic function is moderately decreased, LVEF is visually estimated at 30-35%. 4. There is grade II diastolic dysfunction (elevated filling pressure). 5. The left atrium is moderately dilated in size. 6. The right ventricle is normal in size, with normal systolic function.   Left Ventricle  The left ventricle is mildly to moderately dilated in size with mildly  increased wall thickness.  The left ventricular systolic function is moderately decreased, LVEF is  visually estimated at 30-35%.  There is grade II diastolic dysfunction (elevated filling pressure).  No LV thrombus.   Right Ventricle  The right ventricle is normal in size, with normal systolic function.    Left Atrium  The left atrium is moderately dilated in size.   Right Atrium  The right atrium is normal in size.    Aortic Valve  The aortic valve is trileaflet with normal appearing leaflets with normal  excursion.  There is no significant aortic regurgitation.  There is no evidence of a significant transvalvular gradient.   Pulmonic Valve  The pulmonic valve is normal.  There is mild pulmonic regurgitation.  There is no evidence of a significant transvalvular gradient.   Mitral Valve  The mitral valve leaflets are normal with normal leaflet mobility.  There is trivial mitral valve regurgitation.   Tricuspid Valve  The tricuspid valve leaflets are normal, with normal leaflet mobility.  There is no significant tricuspid regurgitation.  Pulmonary systolic pressure cannot be estimated due to insufficient TR jet.    Other  Findings  Rhythm: SinusRhythm.   Pericardium/Pleural  There is no pericardial effusion.   Inferior Vena Cava  IVC size and inspiratory change suggest normal right atrial pressure. (0-5  mmHg).   Aorta  The aorta is normal in size in the visualized segments    Cardiac Catheterization: 07/2017  Mid RCA lesion is 100% stenosed. Vessel fills by collaterals.  Prox Cx to Mid Cx lesion is 100% stenosed. Vesel fills by left to left collaterals.  Ramus lesion is 50% stenosed.  Mid LAD lesion is 25% stenosed.  LV end diastolic pressure is moderately elevated.  There is no aortic valve stenosis.  Hemodynamic findings consistent with mild pulmonary hypertension.  Ao sat: 96%, PA sat 63%; CO: 5.4 L.min; CI: 2.1   Recommend Aspirin 81mg  daily for moderate CAD.Consider CTO PCI attempt if the inferior and lateral myocardium is viable, after his foot ulcer has been addressed.   May need additional diuresis.   Continue treatment for sleep apnea. Weight loss was also recommended.   Restart metformin in 48 hours.   Echocardiogram: 04/2018 1. The left ventricle has moderate-severely reduced systolic function, with an ejection fraction of 30-35%. The cavity size was moderately dilated. Left ventricular diastolic Doppler parameters are consistent with pseudonormalization. 2. There is akinesis of the left ventricular, basal-mid inferior wall and lateral wall. 3. The right ventricle has normal systolic function. The cavity was normal. There is no increase in right ventricular wall thickness. Right ventricular systolic pressure could not be assessed. 4. The aortic valve is tricuspid. Mild to moderate aortic annular calcification noted. 5. The mitral valve is grossly normal. There is mild mitral annular calcification present. There is calcification of the posteromedial papillary muscle and apparatus. 6. The tricuspid valve is grossly normal. 7. The aortic root is  normal in size and structure. 8. The interatrial septum was not assessed.  Assessment and Plan:  1. Chronic systolic heart failure (HCC)   2. Mixed hyperlipidemia   3. Essential hypertension   4. Non-healing ulcer of lower extremity, right, with unspecified severity (HCC)    1. Chronic systolic heart failure [facility]) Recent echocardiogram [facility] on 05/29/2020.  He continues with a EF of 30 to 35%, G2 DD, trivial MR. Increase Entresto to 49/51 mg p.o. twice daily.  Get a basic metabolic panel and magnesium in 2 weeks.  Continue Toprol-XL 50 mg daily.  2. Mixed hyperlipidemia Continue simvastatin 20 mg daily.  Continue aspirin 81 mg daily.  3. Essential hypertension Blood pressure is elevated today at 142/82.  Blood pressure at recent PCP office visit was 140/80.  We are increasing Entresto dosage to 49/51 mg p.o. twice daily in the hopes it may improve his EF and better manage his blood pressure in addition.  Continue Toprol 50 mg p.o. daily.  We will follow-up in 1 month to reassess blood pressures. . 4.  Nonhealing ulcer of right lower extremity Patient is here for clearance to undergo hyperbaric oxygen therapy for poor wound healing of right lower extremity ulcer on his foot.  He is undergoing wound care therapy but needs hyperbaric oxygen.  He has a history of chronic systolic heart failure.  There are no contraindications to HBO therapy in his case.  Medication Adjustments/Labs and Tests Ordered: Current medicines are reviewed at length with the patient today.  Concerns regarding medicines are outlined above.   Disposition: Follow-up with [name] or APP 1 month  Signed, Rennis Harding, NP 06/22/2020 2:51 PM    [facility] at [facility] [address] [place], [place], [place] [address] Phone: [phone]; Fax: [phone]

## 2020-06-22 ENCOUNTER — Ambulatory Visit (INDEPENDENT_AMBULATORY_CARE_PROVIDER_SITE_OTHER): Payer: Medicare Other | Admitting: Family Medicine

## 2020-06-22 ENCOUNTER — Encounter: Payer: Self-pay | Admitting: Family Medicine

## 2020-06-22 ENCOUNTER — Encounter: Payer: Self-pay | Admitting: *Deleted

## 2020-06-22 ENCOUNTER — Other Ambulatory Visit: Payer: Self-pay

## 2020-06-22 VITALS — BP 142/82 | HR 76 | Ht 72.0 in | Wt 302.2 lb

## 2020-06-22 DIAGNOSIS — E782 Mixed hyperlipidemia: Secondary | ICD-10-CM | POA: Diagnosis not present

## 2020-06-22 DIAGNOSIS — I5022 Chronic systolic (congestive) heart failure: Secondary | ICD-10-CM

## 2020-06-22 DIAGNOSIS — L97919 Non-pressure chronic ulcer of unspecified part of right lower leg with unspecified severity: Secondary | ICD-10-CM

## 2020-06-22 DIAGNOSIS — E1169 Type 2 diabetes mellitus with other specified complication: Secondary | ICD-10-CM | POA: Diagnosis not present

## 2020-06-22 DIAGNOSIS — I10 Essential (primary) hypertension: Secondary | ICD-10-CM | POA: Diagnosis not present

## 2020-06-22 DIAGNOSIS — B9562 Methicillin resistant Staphylococcus aureus infection as the cause of diseases classified elsewhere: Secondary | ICD-10-CM | POA: Diagnosis not present

## 2020-06-22 DIAGNOSIS — M869 Osteomyelitis, unspecified: Secondary | ICD-10-CM | POA: Diagnosis not present

## 2020-06-22 DIAGNOSIS — L97412 Non-pressure chronic ulcer of right heel and midfoot with fat layer exposed: Secondary | ICD-10-CM | POA: Diagnosis not present

## 2020-06-22 DIAGNOSIS — B965 Pseudomonas (aeruginosa) (mallei) (pseudomallei) as the cause of diseases classified elsewhere: Secondary | ICD-10-CM | POA: Diagnosis not present

## 2020-06-22 DIAGNOSIS — E11621 Type 2 diabetes mellitus with foot ulcer: Secondary | ICD-10-CM | POA: Diagnosis not present

## 2020-06-22 MED ORDER — SACUBITRIL-VALSARTAN 49-51 MG PO TABS
1.0000 | ORAL_TABLET | Freq: Two times a day (BID) | ORAL | 6 refills | Status: DC
Start: 2020-06-22 — End: 2020-12-31

## 2020-06-22 NOTE — Patient Instructions (Addendum)
Medication Instructions:   Increase Entresto to 49/51mg  twice a day.  Continue all other medications.    Labwork:  BMET, Mg - orders given today.   Please do in 2 weeks (around 07/06/2020).  Office will contact with results via phone or letter.    Testing/Procedures: none  Follow-Up: 1 month   Any Other Special Instructions Will Be Listed Below (If Applicable).  If you need a refill on your cardiac medications before your next appointment, please call your pharmacy.

## 2020-06-24 ENCOUNTER — Other Ambulatory Visit: Payer: Self-pay

## 2020-06-24 ENCOUNTER — Encounter (HOSPITAL_BASED_OUTPATIENT_CLINIC_OR_DEPARTMENT_OTHER): Payer: Medicare Other | Admitting: Physician Assistant

## 2020-06-24 DIAGNOSIS — Z794 Long term (current) use of insulin: Secondary | ICD-10-CM | POA: Diagnosis not present

## 2020-06-24 DIAGNOSIS — L97512 Non-pressure chronic ulcer of other part of right foot with fat layer exposed: Secondary | ICD-10-CM | POA: Diagnosis not present

## 2020-06-24 DIAGNOSIS — E1142 Type 2 diabetes mellitus with diabetic polyneuropathy: Secondary | ICD-10-CM | POA: Diagnosis not present

## 2020-06-24 DIAGNOSIS — F17218 Nicotine dependence, cigarettes, with other nicotine-induced disorders: Secondary | ICD-10-CM | POA: Diagnosis not present

## 2020-06-24 DIAGNOSIS — E11621 Type 2 diabetes mellitus with foot ulcer: Secondary | ICD-10-CM | POA: Diagnosis not present

## 2020-06-24 DIAGNOSIS — Z89411 Acquired absence of right great toe: Secondary | ICD-10-CM | POA: Diagnosis not present

## 2020-06-24 DIAGNOSIS — I5042 Chronic combined systolic (congestive) and diastolic (congestive) heart failure: Secondary | ICD-10-CM | POA: Diagnosis not present

## 2020-06-24 DIAGNOSIS — E1151 Type 2 diabetes mellitus with diabetic peripheral angiopathy without gangrene: Secondary | ICD-10-CM | POA: Diagnosis not present

## 2020-06-24 DIAGNOSIS — L97529 Non-pressure chronic ulcer of other part of left foot with unspecified severity: Secondary | ICD-10-CM | POA: Diagnosis not present

## 2020-06-24 DIAGNOSIS — I110 Hypertensive heart disease with heart failure: Secondary | ICD-10-CM | POA: Diagnosis not present

## 2020-06-24 NOTE — Progress Notes (Addendum)
[name], [place] ([id number]) Visit Report for 06/24/2020 Chief Complaint Document Details Patient Name: Date of Service: Colon FlatteryREYNO LDS [name], RA [facility] 06/24/2020 1:15 PM Medical Record Number: [id number] Patient Account Number: [id number] Date of Birth/Sex: Treating RN: 07-08-1970 (49 y.o. [name]) Boehlein, Linda Primary Care Provider: Lia HoppingHasanaj, Xaje Other Clinician: Referring Provider: Treating Provider/Extender: Laurann MontanaStone [name], Markevious Ehmke Hasanaj, Xaje Weeks in Treatment: 7 Information Obtained from: Patient Chief Complaint Right foot ulcer Electronic Signature(s) Signed: 06/24/2020 2:03:33 PM By: Lenda KelpStone [name], Dorianna Mckiver PA-C Entered By: Lenda KelpStone [name], Dalyn Kjos on 06/24/2020 14:03:33 -------------------------------------------------------------------------------- Debridement Details Patient Name: Date of Service: [name], [facility] 1:15 PM Medical Record Number: [id number] Patient Account Number: [id number] Date of Birth/Sex: Treating RN: 07-08-1970 (49 y.o. [name]) Boehlein, Linda Primary Care Provider: Lia HoppingHasanaj, Xaje Other Clinician: Referring Provider: Treating Provider/Extender: Laurann MontanaStone [name], Vinie Charity Hasanaj, Xaje Weeks in Treatment: 7 Debridement Performed for Assessment: Wound #3 Right,Medial Foot Performed By: Physician Lenda KelpStone [name], Jalaysha Skilton, PA Debridement Type: Debridement Severity of Tissue Pre Debridement: Fat layer exposed Level of Consciousness (Pre-procedure): Awake and Alert Pre-procedure Verification/Time Out Yes - 14:05 Taken: Start Time: 14:08 Pain Control: Other : benzocaine 20% spray T Area Debrided (L x W): otal 2 (cm) x 1.5 (cm) = 3 (cm) Tissue and other material debrided: Viable, Non-Viable, Slough, Subcutaneous, Slough Level: Skin/Subcutaneous Tissue Debridement Description: Excisional Instrument: Curette Bleeding: Minimum Hemostasis Achieved: Pressure Procedural Pain: 0 Post Procedural Pain: 0 Response to Treatment: Procedure was tolerated well Level of Consciousness (Post- Awake  and Alert procedure): Post Debridement Measurements of Total Wound Length: (cm) 2 Width: (cm) 1.5 Depth: (cm) 0.9 Volume: (cm) 2.121 Character of Wound/Ulcer Post Debridement: Improved Severity of Tissue Post Debridement: Fat layer exposed Post Procedure Diagnosis Same as Pre-procedure Electronic Signature(s) Signed: 06/24/2020 5:40:25 PM By: Lenda KelpStone [name], Makinzi Prieur PA-C Signed: 06/24/2020 6:12:56 PM By: Zenaida DeedBoehlein, Linda RN, BSN Entered By: Zenaida DeedBoehlein, Linda on 06/24/2020 14:09:49 -------------------------------------------------------------------------------- HPI Details Patient Name: Date of Service: [name], RA [facility] 06/24/2020 1:15 PM Medical Record Number: [id number] Patient Account Number: [id number] Date of Birth/Sex: Treating RN: 07-08-1970 (49 y.o. [name]) Boehlein, Linda Primary Care Provider: Lia HoppingHasanaj, Xaje Other Clinician: Referring Provider: Treating Provider/Extender: Laurann MontanaStone [name], Ellese Julius Hasanaj, Xaje Weeks in Treatment: 7 History of Present Illness HPI Description: ADMISSION 05/13/2019 This is a 50 year old man who has type 2 diabetes with peripheral neuropathy. He has a history of wounds on the plantar aspect of his left first and fourth toes. He has had these for several months. He was being [facility] in [facility] [place]. He apparently presented with swelling and an open wound at the tip of the toe. An MRI showed osteomyelitis. He was given 6 to 8 weeks of oral doxycycline again all of this via the patient we have none of these records. Since then he has been putting Goldbond on the areas wearing regular shoes. He was seen by his primary physician on 4/9 and sent down here for our review. In the primary care note it says he has been recommended for hyperbaric oxygen. Past medical history includes type 2 diabetes with peripheral neuropathy, heart failure with reduced ejection fraction 30 to 35%, peripheral arterial disease, coronary artery  disease, hyperlipidemia, hypertension, syncope and a prior history of a right great toe amputation also [place] Dr. Lyn[place] ABIs in our clinic were 0.91 on the left. Also notable that in 2019 he appears to have had arterial studies that are visible in our system. At  that point the right ABI was 1.17, TBI of 0.85 with triphasic waveforms. On the left his ABI was 1.19 with a TBI of 0.73 again with triphasic waveform 5/3; we did receive some information from [facility] wound care. The patient was seen there on 1/29. At that point he had wounds on the right foot at the amputation site the fifth digit fourth digit I am presuming on the right and then an area on the left first digit although that is not specifically stated, he has had a previous amputation on the right however. It would appear him at that time most of his ulcers were on the right the left foot is stated to have a lot of scales and calluses but not a lot of open wounds. The only wounds we define when he came in here last time were on the left first and left fourth toe He also had a wound culture that showed heavy growth of staph aureus and a slight growth of Stenotrophomonas maltophilia. The doxycycline should cover the staph aureus I am not sure about the stenotrophomonas. In any case he completed 6 weeks of this. UNFORTUNATELY I still do not have a copy of the MRI. And the exact justification for hyperbarics is therefore lacking. 5/10; the patient had osteomyelitis in the left fourth toe. He was treated for 6 weeks of doxycycline this wound is healed as is the left first toe. He was sent here for hyperbaric oxygen however at this point his wounds are healed and I think a course of watchful waiting and observation is in order. If the left fourth toe reopens then it is likely he will need a more protracted and aggressive treatment approach Readmission: 05/06/20 upon evaluation today patient presents for readmission here in the clinic exam  issues with his right foot on the medial aspect at the amputation site where his great toe was this is the first metatarsal area that is affected. He has been seeing Dr. Marcha Solders who performed the surgery. With that being said currently the patient was diagnosed as having MRSA on a culture that was + March 31. Has been on IV vancomycin for 6 weeks he is now on doxycycline as the MRSA was sensitive to Doxy and he does have 2 more refills he has been on that for 3 weeks already. He has MRI of January for showed L knee first metatarsal base early osteomyelitis versus possible reactive disease but nonetheless based on what was seen it appears osteomyelitis is more likely the cause here. He has had Apligraf although to be honest that did not seem to do the job. He is also been using Dakin's solution and currently is using Aquacel. Sharp debridement has been performed by Dr. Marcha Solders on a weekly basis according to what the patient tells me. With all that being said the patient was referred to [place] for further evaluation and treatment as unfortunately he does not seem to be responding well to his treatment with the surgeon. They have considered a total contact cast it sounds like but again with the infection that this was not a good idea. The patient does have a significant past medical history for diabetes mellitus type 2 for which she is on insulin, hypertension, significant congestive heart failure with a most recent ejection fraction of 30 to 35% and that was in 2020. He has not seen his cardiologist [name] since that time. In regard to his diabetes his A1c he does not even know he tells  me his blood sugars run somewhere in the 200-300 range. With that being said he has not seen the primary care provider recently for management of this either. The patient tells me he is also a current smoker. He tells me he is trying to stop using nicotine pouches but he just does not have enough saliva he tells me his mouth  is dry all the time this is probably a subsequent issue from the diabetes as well. Unfortunately he just appears to be in a place where he is not necessarily at his healthiest even with his medical conditions. I think he would be a candidate for hyperbaric oxygen therapy but there are a lot of other things that need to be in place first before we get to that point. 05/13/2020 patient presents for 1 week follow-up. He has been using Hydrofera Blue every other day without any issues. He had to reschedule his cardiologist appointment due to transportation Issues. He has no complaints today. 05/20/2020 upon evaluation today patient appears to be doing well with regard to his foot ulcer all things considered. With that being said he tells me that he has been tolerating the dressing changes without complication. There does not appear to be any signs of active infection which is great news and overall very pleased with where things stand today. No fevers, chills, nausea, vomiting, or diarrhea. 05/27/2020 upon evaluation today patient appears to be doing well with regard to his wound on the foot. He does not require little bit of debridement today but overall he seems to be doing quite well. He actually has his echo on Monday we will see what that shows as well were hoping to be able to get him into the hyperbaric oxygen chamber but again a lot depends on what this test shows he will also need a chest x-ray if this turns out okay before going in the chamber. 06/03/2020 upon evaluation today patient's wound actually showing signs of minimal improvement. Fortunately I think that he is continuing to make great progress all things considered. He does have his echocardiogram next week. That is good news so we can see whether or not hyperbaric oxygen therapy would be appropriate for him I am hopeful we will be able to get him in the chamber sooner rather than later but again it all depends on his ejection fraction. Right  now we have been somewhat limited in being able to proceed with that as to be perfectly honest we just have not gotten the needed testing in order to go forward with the hyperbarics. If his ejection fraction is too low which could be the case then obviously is not good to be able to 5/25; patient presents for 1 week follow-up. He states he has his echo set up for next week. He overall feels well. He denies any issues over the past week. He denies signs of infection. 06/17/2020 upon evaluation today patient appears to be doing about the same in regard to his foot ulcer is measuring a little bit smaller today but still keeps developing the spongy tissue. I really think it may be a good idea for [place] to obtain a sample of a punch biopsy to send for evaluation. He is in agreement with this plan. Subsequently he did have his echocardiogram which showed that he has a systolic left ventricular function which is moderately decreased estimated to be 30 to 35%. He does have a follow-up appointment with the cardiologist on Monday to see what they think  about this. I advised that he needs to talk to them as well that what they feel about him potentially going into hyperbaric oxygen therapy if his heart would be sufficient for this or not. Obviously if they feel that it is something that would be sufficient then we need to have this in writing. Otherwise patient tells me that he has been doing fairly well is not really having any significant pain. 06/24/2020 upon evaluation today patient appears to be doing decently well in regard to his wound. In fact he does not have as much of the tissue that grew back this time. I do see that he had the results back from his skin biopsy. This showed reactive squamous hyperplasia. This again seems to be somewhat similar to lichen planus and how that would function. With that being said there was no malignancy and potentially this means it could be a result and a response to like  a topical steroid like triamcinolone. I believe this may at least be something to get a shot at this point. The patient is in agreement with the plan. I am very pleased though there is no malignancy noted. We also got a letter back from his cardiologist stating that he is from a cardiac perspective able to participate in hyperbaric oxygen therapy. Therefore if need be I think we can proceed as such. For that reason I am going to send in for the chest x-ray in order to ensure that we have what we need in place if we do need to proceed down that road. Electronic Signature(s) Signed: 06/24/2020 2:21:35 PM By: Lenda Kelp PA-C Entered By: Lenda Kelp on 06/24/2020 14:21:34 -------------------------------------------------------------------------------- Physical Exam Details Patient Name: Date of Service: [name], RA NDO [facility] 06/24/2020 1:15 PM Medical Record Number: [id number] Patient Account Number: [id number] Date of Birth/Sex: Treating RN: Jun 06, 1970 (49 y.o. Jose Schooner Primary Care Provider: [name] Other Clinician: Referring Provider: Treating Provider/Extender: [name] in Treatment: 7 Constitutional Well-nourished and well-hydrated in no acute distress. Respiratory normal breathing without difficulty. Psychiatric this patient is able to make decisions and demonstrates good insight into disease process. Alert and Oriented x 3. pleasant and cooperative. Notes Upon inspection patient's wound bed did require some continued debridement as far as the hyperplasia is concerned. He tolerated the debridement today without complication postdebridement the wound bed appears to be doing much better. I think he had less overgrowth this time that noted last visit which is good news were headed in the right direction. Electronic Signature(s) Signed: 06/24/2020 2:22:14 PM By: Lenda Kelp PA-C Entered By: Lenda Kelp on 06/24/2020  14:22:14 -------------------------------------------------------------------------------- Physician Orders Details Patient Name: Date of Service: [name], RA NDO [facility] 06/24/2020 1:15 PM Medical Record Number: [id number] Patient Account Number: [id number] Date of Birth/Sex: Treating RN: [birth year] (49 y.o. Jose Schooner Primary Care Provider: [name] Other Clinician: Referring Provider: Treating Provider/Extender: [name] in Treatment: 7 Verbal / Phone Orders: No Diagnosis Coding ICD-10 Coding Code Description E11.621 Type 2 diabetes mellitus with foot ulcer L97.512 Non-pressure chronic ulcer of other part of right foot with fat layer exposed E11.42 Type 2 diabetes mellitus with diabetic polyneuropathy I10 Essential (primary) hypertension I50.42 Chronic combined systolic (congestive) and diastolic (congestive) heart failure F17.218 Nicotine dependence, cigarettes, with other nicotine-induced disorders Follow-up Appointments ppointment in 1 week. - with Leonard Schwartz Return A Bathing/ Shower/ Hygiene May shower with protection but do not get wound  dressing(s) wet. Off-Loading Wedge shoe to: - right foot to ambulate Additional Orders / Instructions Stop/Decrease Smoking Follow Nutritious Diet Home Health New wound care orders this week; continue Home Health for wound care. May utilize formulary equivalent dressing for wound treatment orders unless otherwise specified. - add triamcinolone cream to wound bed under hydrofera blue Other Home Health Orders/Instructions: - [facility] Wound #3 - Foot Wound Laterality: Right, Medial Topical: Triamcinolone 1 x Per Day/30 Days Discharge Instructions: Apply thin layer Triamcinolone to the wound bed Prim Dressing: Hydrofera Blue Classic Foam, 4x4 in 1 x Per Day/30 Days ary Discharge Instructions: Moisten with saline prior to applying to wound,cut to fit inside wound edges Secondary  Dressing: Zetuvit Plus Silicone Border Dressing 5x5 (in/in) 1 x Per Day/30 Days Discharge Instructions: Apply silicone border over primary dressing , or may use gauze and secure with rolled gauze Radiology X-ray, Chest 2 view - pre hyperbaric oxygen therapy CPT - (ICD10 E11.621 - Type 2 diabetes mellitus with foot ulcer) Patient Medications llergies: No Known Allergies A Notifications Medication Indication Start End 06/24/2020 triamcinolone acetonide DOSE 0 - topical 0.1 % ointment - ointment topical applied in a thin film to the wound bed and the cover with your dressing daily as directed in the clinic. Electronic Signature(s) Signed: 06/24/2020 2:24:17 PM By: Lenda Kelp PA-C Entered By: Lenda Kelp on 06/24/2020 14:24:16 Prescription 06/24/2020 -------------------------------------------------------------------------------- Laury Axon, Lossie Faes PA Patient Name: Provider: 1970-04-12 [id number] Date of Birth: NPI#: Judie Petit [id number] Sex: DEA #: [phone] Phone #: License #: Eligha Bridegroom [facility] Patient Address: [address] [place], [place] [address] [address] [place], [place] [address] [phone] Allergies No Known Allergies Provider's Orders X-ray, Chest 2 view - ICD10: E11.621 - pre hyperbaric oxygen therapy CPT Hand Signature: Date(s): Electronic Signature(s) Signed: 06/24/2020 5:40:25 PM By: Lenda Kelp PA-C Entered By: Lenda Kelp on 06/24/2020 14:24:17 -------------------------------------------------------------------------------- Problem List Details Patient Name: Date of Service: [name], RA NDO [facility] 06/24/2020 1:15 PM Medical Record Number: [id number] Patient Account Number: [id number] Date of Birth/Sex: Treating RN: 06-Mar-1970 (49 y.o. Bayard Hugger, Bonita Quin Primary Care Provider: [name] Other Clinician: Referring Provider: Treating Provider/Extender: [name] in Treatment: 7 Active Problems ICD-10 Encounter Code Description Active Date MDM Diagnosis E11.621 Type 2 diabetes mellitus with foot ulcer 05/06/2020 No Yes L97.512 Non-pressure chronic ulcer of other part of right foot with fat layer exposed 05/06/2020 No Yes E11.42 Type 2 diabetes mellitus with diabetic polyneuropathy 05/06/2020 No Yes I10 Essential (primary) hypertension 05/06/2020 No Yes I50.42 Chronic combined systolic (congestive) and diastolic (congestive) heart failure 05/06/2020 No Yes F17.218 Nicotine dependence, cigarettes, with other nicotine-induced disorders 05/06/2020 No Yes Inactive Problems Resolved Problems Electronic Signature(s) Signed: 06/24/2020 2:03:27 PM By: Lenda Kelp PA-C Entered By: Lenda Kelp on 06/24/2020 14:03:27 -------------------------------------------------------------------------------- Progress Note Details Patient Name: Date of Service: [name], RA NDO [facility] 06/24/2020 1:15 PM Medical Record Number: [id number] Patient Account Number: [id number] Date of Birth/Sex: Treating RN: 28-Oct-1970 (49 y.o. Jose Schooner Primary Care Provider: [name] Other Clinician: Referring Provider: Treating Provider/Extender: [name] in Treatment: 7 Subjective Chief Complaint Information obtained from Patient Right foot ulcer History of Present Illness (HPI) ADMISSION 05/13/2019 This is a 50 year old man who has type 2 diabetes with peripheral neuropathy. He has a history of wounds on the plantar aspect of his left first and fourth toes.  He has had these for several months. He was being seen in the wound care center at [facility] in [facility]. He apparently presented with swelling and an open wound at the tip of the toe. An MRI showed osteomyelitis. He was given 6 to 8 weeks of oral doxycycline again all of this via the patient we have none of these records. Since then he has been putting  Goldbond on the areas wearing regular shoes. He was seen by his primary physician on 4/9 and sent down here for our review. In the primary care note it says he has been recommended for hyperbaric oxygen. Past medical history includes type 2 diabetes with peripheral neuropathy, heart failure with reduced ejection fraction 30 to 35%, peripheral arterial disease, coronary artery disease, hyperlipidemia, hypertension, syncope and a prior history of a right great toe amputation also by Dr. Lynden Ang in [place] ABIs in our clinic were 0.91 on the left. Also notable that in 2019 he appears to have had arterial studies that are visible in our system. At that point the right ABI was 1.17, TBI of 0.85 with triphasic waveforms. On the left his ABI was 1.19 with a TBI of 0.73 again with triphasic waveform 5/3; we did receive some information from [facility] wound care. The patient was seen there on 1/29. At that point he had wounds on the right foot at the amputation site the fifth digit fourth digit I am presuming on the right and then an area on the left first digit although that is not specifically stated, he has had a previous amputation on the right however. It would appear him at that time most of his ulcers were on the right the left foot is stated to have a lot of scales and calluses but not a lot of open wounds. The only wounds we define when he came in here last time were on the left first and left fourth toe He also had a wound culture that showed heavy growth of staph aureus and a slight growth of Stenotrophomonas maltophilia. The doxycycline should cover the staph aureus I am not sure about the stenotrophomonas. In any case he completed 6 weeks of this. UNFORTUNATELY I still do not have a copy of the MRI. And the exact justification for hyperbarics is therefore lacking. 5/10; the patient had osteomyelitis in the left fourth toe. He was treated for 6 weeks of doxycycline this wound is healed as is the left  first toe. He was sent here for hyperbaric oxygen however at this point his wounds are healed and I think a course of watchful waiting and observation is in order. If the left fourth toe reopens then it is likely he will need a more protracted and aggressive treatment approach Readmission: 05/06/20 upon evaluation today patient presents for readmission here in the clinic exam issues with his right foot on the medial aspect at the amputation site where his great toe was this is the first metatarsal area that is affected. He has been seeing Dr. Marcha Solders who performed the surgery. With that being said currently the patient was diagnosed as having MRSA on a culture that was + March 31. Has been on IV vancomycin for 6 weeks he is now on doxycycline as the MRSA was sensitive to Doxy and he does have 2 more refills he has been on that for 3 weeks already. He has MRI of January for showed L knee first metatarsal base early osteomyelitis versus possible reactive disease but  nonetheless based on what was seen it appears osteomyelitis is more likely the cause here. He has had Apligraf although to be honest that did not seem to do the job. He is also been using Dakin's solution and currently is using Aquacel. Sharp debridement has been performed by Dr. Marcha Solders on a weekly basis according to what the patient tells me. With all that being said the patient was referred to [place] for further evaluation and treatment as unfortunately he does not seem to be responding well to his treatment with the surgeon. They have considered a total contact cast it sounds like but again with the infection that this was not a good idea. The patient does have a significant past medical history for diabetes mellitus type 2 for which she is on insulin, hypertension, significant congestive heart failure with a most recent ejection fraction of 30 to 35% and that was in 2020. He has not seen his cardiologist [name] since that time. In regard to  his diabetes his A1c he does not even know he tells me his blood sugars run somewhere in the 200-300 range. With that being said he has not seen the primary care provider recently for management of this either. The patient tells me he is also a current smoker. He tells me he is trying to stop using nicotine pouches but he just does not have enough saliva he tells me his mouth is dry all the time this is probably a subsequent issue from the diabetes as well. Unfortunately he just appears to be in a place where he is not necessarily at his healthiest even with his medical conditions. I think he would be a candidate for hyperbaric oxygen therapy but there are a lot of other things that need to be in place first before we get to that point. 05/13/2020 patient presents for 1 week follow-up. He has been using Hydrofera Blue every other day without any issues. He had to reschedule his cardiologist appointment due to transportation Issues. He has no complaints today. 05/20/2020 upon evaluation today patient appears to be doing well with regard to his foot ulcer all things considered. With that being said he tells me that he has been tolerating the dressing changes without complication. There does not appear to be any signs of active infection which is great news and overall very pleased with where things stand today. No fevers, chills, nausea, vomiting, or diarrhea. 05/27/2020 upon evaluation today patient appears to be doing well with regard to his wound on the foot. He does not require little bit of debridement today but overall he seems to be doing quite well. He actually has his echo on Monday we will see what that shows as well were hoping to be able to get him into the hyperbaric oxygen chamber but again a lot depends on what this test shows he will also need a chest x-ray if this turns out okay before going in the chamber. 06/03/2020 upon evaluation today patient's wound actually showing signs of minimal  improvement. Fortunately I think that he is continuing to make great progress all things considered. He does have his echocardiogram next week. That is good news so we can see whether or not hyperbaric oxygen therapy would be appropriate for him I am hopeful we will be able to get him in the chamber sooner rather than later but again it all depends on his ejection fraction. Right now we have been somewhat limited in being able to proceed with that  as to be perfectly honest we just have not gotten the needed testing in order to go forward with the hyperbarics. If his ejection fraction is too low which could be the case then obviously is not good to be able to 5/25; patient presents for 1 week follow-up. He states he has his echo set up for next week. He overall feels well. He denies any issues over the past week. He denies signs of infection. 06/17/2020 upon evaluation today patient appears to be doing about the same in regard to his foot ulcer is measuring a little bit smaller today but still keeps developing the spongy tissue. I really think it may be a good idea for [place] to obtain a sample of a punch biopsy to send for evaluation. He is in agreement with this plan. Subsequently he did have his echocardiogram which showed that he has a systolic left ventricular function which is moderately decreased estimated to be 30 to 35%. He does have a follow-up appointment with the cardiologist on Monday to see what they think about this. I advised that he needs to talk to them as well that what they feel about him potentially going into hyperbaric oxygen therapy if his heart would be sufficient for this or not. Obviously if they feel that it is something that would be sufficient then we need to have this in writing. Otherwise patient tells me that he has been doing fairly well is not really having any significant pain. 06/24/2020 upon evaluation today patient appears to be doing decently well in regard to his wound.  In fact he does not have as much of the tissue that grew back this time. I do see that he had the results back from his skin biopsy. This showed reactive squamous hyperplasia. This again seems to be somewhat similar to lichen planus and how that would function. With that being said there was no malignancy and potentially this means it could be a result and a response to like a topical steroid like triamcinolone. I believe this may at least be something to get a shot at this point. The patient is in agreement with the plan. I am very pleased though there is no malignancy noted. We also got a letter back from his cardiologist stating that he is from a cardiac perspective able to participate in hyperbaric oxygen therapy. Therefore if need be I think we can proceed as such. For that reason I am going to send in for the chest x-ray in order to ensure that we have what we need in place if we do need to proceed down that road. Objective Constitutional Well-nourished and well-hydrated in no acute distress. Vitals Time Taken: 1:17 PM, Height: 72 in, Weight: 304 lbs, BMI: 41.2, Temperature: 98.7 F, Pulse: 87 bpm, Respiratory Rate: 17 breaths/min, Blood Pressure: 110/72 mmHg. Respiratory normal breathing without difficulty. Psychiatric this patient is able to make decisions and demonstrates good insight into disease process. Alert and Oriented x 3. pleasant and cooperative. General Notes: Upon inspection patient's wound bed did require some continued debridement as far as the hyperplasia is concerned. He tolerated the debridement today without complication postdebridement the wound bed appears to be doing much better. I think he had less overgrowth this time that noted last visit which is good news were headed in the right direction. Integumentary (Hair, Skin) Wound #3 status is Open. Original cause of wound was Gradually Appeared. The date acquired was: 07/18/2019. The wound has been in treatment 7 weeks.  The wound is located on the Right,Medial Foot. The wound measures 2cm length x 1.5cm width x 0.9cm depth; 2.356cm^2 area and 2.121cm^3 volume. There is Fat Layer (Subcutaneous Tissue) exposed. There is no tunneling or undermining noted. There is a medium amount of serosanguineous drainage noted. Foul odor after cleansing was noted. The wound margin is thickened. There is small (1-33%) red, pink granulation within the wound bed. There is a large (67-100%) amount of necrotic tissue within the wound bed including Eschar and Adherent Slough. 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 There was a Excisional Skin/Subcutaneous Tissue Debridement with a total area of 3 sq cm performed by Lenda Kelp, PA. With the following instrument(s): Curette to remove Viable and Non-Viable tissue/material. Material removed includes Subcutaneous Tissue and Slough and after achieving pain control using Other (benzocaine 20% spray). No specimens were taken. A time out was conducted at 14:05, prior to the start of the procedure. A Minimum amount of bleeding was controlled with Pressure. The procedure was tolerated well with a pain level of 0 throughout and a pain level of 0 following the procedure. Post Debridement Measurements: 2cm length x 1.5cm width x 0.9cm depth; 2.121cm^3 volume. Character of Wound/Ulcer Post Debridement is improved. Severity of Tissue Post Debridement is:  Fat layer exposed. Post procedure Diagnosis Wound #3: Same as Pre-Procedure Plan Follow-up Appointments: Return Appointment in 1 week. - with Luana Shu Shower/ Hygiene: May shower with protection but do not get wound dressing(s) wet. Off-Loading: Wedge shoe to: - right foot to ambulate Additional Orders / Instructions: Stop/Decrease Smoking Follow Nutritious Diet Home Health: New wound care orders this week; continue Home Health for wound care. May utilize formulary equivalent dressing for wound treatment orders unless otherwise specified. - add triamcinolone cream to wound bed under hydrofera blue Other Home Health Orders/Instructions: - [facility] ordered were: X-ray, Chest 2 view - pre hyperbaric oxygen therapy CPT The following medication(s) was prescribed: triamcinolone acetonide topical 0.1 % ointment 0 ointment topical applied in a thin film to the wound bed and the cover with your dressing daily as directed in the clinic. starting 06/24/2020 WOUND #3: - Foot Wound Laterality: Right, Medial Topical: Triamcinolone 1 x Per Day/30 Days Discharge Instructions: Apply thin layer Triamcinolone to the wound bed Prim Dressing: Hydrofera Blue Classic Foam, 4x4 in 1 x Per Day/30 Days ary Discharge Instructions: Moisten with saline prior to applying to wound,cut to fit inside wound edges Secondary Dressing: Zetuvit Plus Silicone Border Dressing 5x5 (in/in) 1 x Per Day/30 Days Discharge Instructions: Apply silicone border over primary dressing , or may use gauze and secure with rolled gauze 1. I would recommend currently that we actually go ahead and continue with the wound care measures as before with the [facility] I think that still the best way to go and the patient is in agreement with that plan. 2. Also can recommend at this time that we have the patient go ahead and continue to monitor for any signs of worsening infection. He will be changing this daily so have a good  chance  to keep a close eye on things hopefully there would not be any issues. 3. I am also can recommend that we send for chest x-ray in preparation for hyperbaric oxygen therapy. Again if he is showing signs of good improvement and that not necessary then that is great but nonetheless I want to make sure we have everything in place and ready to go if we do need to proceed with hyperbarics. We will see patient back for reevaluation in 1 week here in the clinic. If anything worsens or changes patient will contact our office for additional recommendations. Electronic Signature(s) Signed: 06/24/2020 2:38:00 PM By: Lenda Kelp PA-C Entered By: Lenda Kelp on 06/24/2020 14:38:00 -------------------------------------------------------------------------------- SuperBill Details Patient Name: Date of Service: [name], RA NDO [facility] 06/24/2020 Medical Record Number: [id number] Patient Account Number: [id number] Date of Birth/Sex: Treating RN: 07/08/1970 (49 y.o. Jose Schooner Primary Care Provider: [name] Other Clinician: Referring Provider: Treating Provider/Extender: [name] in Treatment: 7 Diagnosis Coding ICD-10 Codes Code Description 8622307930 Type 2 diabetes mellitus with foot ulcer L97.512 Non-pressure chronic ulcer of other part of right foot with fat layer exposed E11.42 Type 2 diabetes mellitus with diabetic polyneuropathy I10 Essential (primary) hypertension I50.42 Chronic combined systolic (congestive) and diastolic (congestive) heart failure F17.218 Nicotine dependence, cigarettes, with other nicotine-induced disorders Facility Procedures CPT4 Code: [id number] Description: 11042 - DEB SUBQ TISSUE 20 SQ CM/< ICD-10 Diagnosis Description L97.512 Non-pressure chronic ulcer of other part of right foot with fat layer exposed Modifier: Quantity: 1 Physician Procedures : CPT4 Code Description Modifier [id number] - WC PHYS LEVEL 3 - EST PT  25 ICD-10 Diagnosis Description E11.621 Type 2 diabetes mellitus with foot ulcer L97.512 Non-pressure chronic ulcer of other part of right foot with fat layer exposed E11.42 Type 2  diabetes mellitus with diabetic polyneuropathy I10 Essential (primary) hypertension Quantity: 1 : [id number] 11042 - WC PHYS SUBQ TISS 20 SQ CM ICD-10 Diagnosis Description L97.512 Non-pressure chronic ulcer of other part of right foot with fat layer exposed Quantity: 1 Electronic Signature(s) Signed: 06/24/2020 2:38:55 PM By: Lenda Kelp PA-C Previous Signature: 06/24/2020 2:38:32 PM Version By: Lenda Kelp PA-C Entered By: Lenda Kelp on 06/24/2020 14:38:55

## 2020-06-24 NOTE — Progress Notes (Signed)
HELIODORO, DOMAGALSKI ([id number]) Visit Report for 06/24/2020 Arrival Information Details Patient Name: Date of Service: [name], RA NDO [facility] 06/24/2020 1:15 PM Medical Record Number: [id number] Patient Account Number: [id number] Date of Birth/Sex: Treating RN: 1970-09-11 (50 y.o. Burnadette Pop, [name] Primary Care Jacory Kamel: Stoney Bang Other Clinician: Referring Tatia Petrucci: Treating Kaiesha Tonner/Extender: [place] Breeding in Treatment: 7 Visit Information History Since Last Visit Added or deleted any medications: No Patient Arrived: Ambulatory Any new allergies or adverse reactions: No Arrival Time: 13:17 Had a fall or experienced change in No Accompanied By: self activities of daily living that may affect Transfer Assistance: None risk of falls: Patient Identification Verified: Yes Signs or symptoms of abuse/neglect since last visito No Secondary Verification Process Completed: Yes Hospitalized since last visit: No Patient Requires Transmission-Based Precautions: No Implantable device outside of the clinic excluding No Patient Has Alerts: No cellular tissue based products placed in the center since last visit: Has Dressing in Place as Prescribed: Yes Pain Present Now: No Electronic Signature(s) Signed: 06/24/2020 6:04:38 PM By: Rhae Hammock RN Entered By: Rhae Hammock on 06/24/2020 13:17:48 -------------------------------------------------------------------------------- Encounter Discharge Information Details Patient Name: Date of Service: [name], RA NDO [facility] 06/24/2020 1:15 PM Medical Record Number: [id number] Patient Account Number: [id number] Date of Birth/Sex: Treating RN: 1970/11/05 (50 y.o. Burnadette Pop, [name] Primary Care Albena Comes: Stoney Bang Other Clinician: Referring Niveah Boerner: Treating Ranada Vigorito/Extender: [place] Breeding in Treatment: 7 Encounter Discharge Information Items Post Procedure Vitals Discharge  Condition: Stable Temperature (F): 98.7 Ambulatory Status: Ambulatory Pulse (bpm): 74 Discharge Destination: Home Respiratory Rate (breaths/min): 17 Transportation: Private Auto Blood Pressure (mmHg): 113/74 Accompanied By: self Schedule Follow-up Appointment: Yes Clinical Summary of Care: Patient Declined Electronic Signature(s) Signed: 06/24/2020 6:04:38 PM By: Rhae Hammock RN Entered By: Rhae Hammock on 06/24/2020 15:07:38 -------------------------------------------------------------------------------- Lower Extremity Assessment Details Patient Name: Date of Service: [name], RA NDO [facility] 06/24/2020 1:15 PM Medical Record Number: [id number] Patient Account Number: [id number] Date of Birth/Sex: Treating RN: 23-Sep-1970 (50 y.o. Burnadette Pop, [name] Primary Care Azusena Erlandson: Stoney Bang Other Clinician: Referring Ebba Goll: Treating Dominick Zertuche/Extender: Matt Holmes Weeks in Treatment: 7 Edema Assessment Assessed: Shirlyn Goltz: No] Patrice Paradise: Yes] Edema: [Left: N] [Right: o] Calf Left: Right: Point of Measurement: 34 cm From Medial Instep 42.5 cm Ankle Left: Right: Point of Measurement: 13 cm From Medial Instep 24.5 cm Vascular Assessment Pulses: Dorsalis Pedis Palpable: [Right:Yes] Posterior Tibial Palpable: [Right:Yes] Electronic Signature(s) Signed: 06/24/2020 6:04:38 PM By: Rhae Hammock RN Entered By: Rhae Hammock on 06/24/2020 13:22:10 -------------------------------------------------------------------------------- Multi Wound Chart Details Patient Name: Date of Service: [name], RA NDO [facility] 06/24/2020 1:15 PM Medical Record Number: [id number] Patient Account Number: [id number] Date of Birth/Sex: Treating RN: October 04, 1970 (50 y.o. [name] Primary Care Natasha Burda: Stoney Bang Other Clinician: Referring Indica Marcott: Treating Joden Bonsall/Extender: [place] Breeding in Treatment: 7 Vital Signs Height(in):  72 Pulse(bpm): 87 Weight(lbs): 304 Blood Pressure(mmHg): 110/72 Body Mass Index(BMI): 41 Temperature(F): 98.7 Respiratory Rate(breaths/min): 17 Photos: [3:No Photos Right, Medial Foot] [N/A:N/A N/A] Wound Location: [3:Gradually Appeared] [N/A:N/A] Wounding Event: [3:Diabetic Wound/Ulcer of the Lower] [N/A:N/A] Primary Etiology: [3:Extremity Sleep Apnea, Congestive Heart] [N/A:N/A] Comorbid History: [3:Failure, Hypertension, Type II Diabetes, Neuropathy 07/18/2019] [N/A:N/A] Date Acquired: [3:7] [N/A:N/A] Weeks of Treatment: [3:Open] [N/A:N/A] Wound Status: [3:2x1.5x0.9] [N/A:N/A] Measurements L x W x D (cm) [3:2.356] [N/A:N/A] A (cm) : rea [3:2.121] [N/A:N/A] Volume (cm) : [3:27.60%] [N/A:N/A] % Reduction in A rea: [3:18.50%] [N/A:N/A] % Reduction in Volume: [  3:Grade 3] [N/A:N/A] Classification: [3:Medium] [N/A:N/A] Exudate A mount: [3:Serosanguineous] [N/A:N/A] Exudate Type: [3:red, brown] [N/A:N/A] Exudate Color: [3:Yes] [N/A:N/A] Foul Odor A Cleansing: [3:fter No] [N/A:N/A] Odor A nticipated Due to Product Use: [3:Thickened] [N/A:N/A] Wound Margin: [3:Small (1-33%)] [N/A:N/A] Granulation A mount: [3:Red, Pink] [N/A:N/A] Granulation Quality: [3:Large (67-100%)] [N/A:N/A] Necrotic A mount: [3:Eschar, Adherent Slough] [N/A:N/A] Necrotic Tissue: [3:Fat Layer (Subcutaneous Tissue): Yes N/A] Exposed Structures: [3:Fascia: No Tendon: No Muscle: No Joint: No Bone: No Small (1-33%)] [N/A:N/A] Treatment Notes Electronic Signature(s) Signed: 06/24/2020 6:12:56 PM By: Baruch Gouty RN, BSN Entered By: Baruch Gouty on 06/24/2020 14:00:56 -------------------------------------------------------------------------------- Multi-Disciplinary Care Plan Details Patient Name: Date of Service: [name], RA NDO [facility] 06/24/2020 1:15 PM Medical Record Number: [id number] Patient Account Number: [id number] Date of Birth/Sex: Treating RN: 03-19-1970 (50 y.o. [name] Primary  Care Arlester Keehan: Stoney Bang Other Clinician: Referring Wilver Tignor: Treating Elmond Poehlman/Extender: [place] Breeding in Treatment: 7 Multidisciplinary Care Plan reviewed with physician Active Inactive Nutrition Nursing Diagnoses: Impaired glucose control: actual or potential Potential for alteratiion in Nutrition/Potential for imbalanced nutrition Goals: Patient/caregiver will maintain therapeutic glucose control Date Initiated: 05/13/2020 Target Resolution Date: 07/08/2020 Goal Status: Active Interventions: Assess patient nutrition upon admission and as needed per policy Treatment Activities: Patient referred to Primary Care Physician for further nutritional evaluation : 05/13/2020 Notes: Osteomyelitis Nursing Diagnoses: Infection: osteomyelitis Knowledge deficit related to disease process and management Goals: Patient's osteomyelitis will resolve Date Initiated: 05/13/2020 Target Resolution Date: 07/08/2020 Goal Status: Active Interventions: Assess for signs and symptoms of osteomyelitis resolution every visit Provide education on osteomyelitis Treatment Activities: Systemic antibiotics : 05/13/2020 T ordered outside of clinic : 05/13/2020 est Notes: Wound/Skin Impairment Nursing Diagnoses: Impaired tissue integrity Knowledge deficit related to ulceration/compromised skin integrity Goals: Patient/caregiver will verbalize understanding of skin care regimen Date Initiated: 05/13/2020 Target Resolution Date: 07/08/2020 Goal Status: Active Ulcer/skin breakdown will have a volume reduction of 30% by week 4 Date Initiated: 05/13/2020 Date Inactivated: 06/10/2020 Target Resolution Date: 06/03/2020 Goal Status: Met Ulcer/skin breakdown will have a volume reduction of 50% by week 8 Date Initiated: 06/10/2020 Target Resolution Date: 07/08/2020 Goal Status: Active Interventions: Assess patient/caregiver ability to obtain necessary supplies Assess patient/caregiver  ability to perform ulcer/skin care regimen upon admission and as needed Assess ulceration(s) every visit Provide education on ulcer and skin care Treatment Activities: Skin care regimen initiated : 05/13/2020 Topical wound management initiated : 05/13/2020 Notes: Electronic Signature(s) Signed: 06/24/2020 6:12:56 PM By: Baruch Gouty RN, BSN Entered By: Baruch Gouty on 06/24/2020 14:00:07 -------------------------------------------------------------------------------- Pain Assessment Details Patient Name: Date of Service: [name], RA NDO [facility] 06/24/2020 1:15 PM Medical Record Number: [id number] Patient Account Number: [id number] Date of Birth/Sex: Treating RN: 1970/04/17 (50 y.o. Burnadette Pop, [name] Primary Care Valdez Brannan: Stoney Bang Other Clinician: Referring Oral Hallgren: Treating Cleve Paolillo/Extender: Dallas Breeding in Treatment: 7 Active Problems Location of Pain Severity and Description of Pain Patient Has Paino No Site Locations Pain Management and Medication Current Pain Management: Electronic Signature(s) Signed: 06/24/2020 6:04:38 PM By: Rhae Hammock RN Entered By: Rhae Hammock on 06/24/2020 13:18:45 -------------------------------------------------------------------------------- Patient/Caregiver Education Details Patient Name: Date of Service: [name], [name] 6/8/2022andnbsp1:15 PM Medical Record Number: [id number] Patient Account Number: [id number] Date of Birth/Gender: Treating RN: 01/12/1971 (50 y.o. [name] Primary Care Physician: Stoney Bang Other Clinician: Referring Physician: Treating Physician/Extender: Dallas Breeding in Treatment: 7 Education Assessment Education Provided To: Patient Education Topics Provided Hyperbaric Oxygenation: Methods: Explain/Verbal Responses:  Reinforcements needed, State content correctly Infection: Methods: Explain/Verbal Responses:  Reinforcements needed, State content correctly Wound/Skin Impairment: Methods: Explain/Verbal Responses: Reinforcements needed, State content correctly Electronic Signature(s) Signed: 06/24/2020 6:12:56 PM By: Baruch Gouty RN, BSN Entered By: Baruch Gouty on 06/24/2020 14:00:35 -------------------------------------------------------------------------------- Wound Assessment Details Patient Name: Date of Service: [name], RA NDO [facility] 06/24/2020 1:15 PM Medical Record Number: [id number] Patient Account Number: [id number] Date of Birth/Sex: Treating RN: December 26, 1970 (50 y.o. Burnadette Pop, [name] Primary Care Keishla Oyer: Stoney Bang Other Clinician: Referring Niki Payment: Treating Rhoda Waldvogel/Extender: [place] Breeding in Treatment: 7 Wound Status Wound Number: 3 Primary Diabetic Wound/Ulcer of the Lower Extremity Etiology: Wound Location: Right, Medial Foot Wound Open Wounding Event: Gradually Appeared Status: Date Acquired: 07/18/2019 Comorbid Sleep Apnea, Congestive Heart Failure, Hypertension, Type II Weeks Of Treatment: 7 History: Diabetes, Neuropathy Clustered Wound: No Photos Wound Measurements Length: (cm) 2 Width: (cm) 1.5 Depth: (cm) 0.9 Area: (cm) 2.356 Volume: (cm) 2.121 % Reduction in Area: 27.6% % Reduction in Volume: 18.5% Epithelialization: Small (1-33%) Tunneling: No Undermining: No Wound Description Classification: Grade 3 Wound Margin: Thickened Exudate Amount: Medium Exudate Type: Serosanguineous Exudate Color: red, brown Foul Odor After Cleansing: Yes Due to Product Use: No Slough/Fibrino Yes Wound Bed Granulation Amount: Small (1-33%) Exposed Structure Granulation Quality: Red, Pink Fascia Exposed: No Necrotic Amount: Large (67-100%) Fat Layer (Subcutaneous Tissue) Exposed: Yes Necrotic Quality: Eschar, Adherent Slough Tendon Exposed: No Muscle Exposed: No Joint Exposed: No Bone Exposed: No Treatment Notes Wound #3  (Foot) Wound Laterality: Right, Medial Cleanser Peri-Wound Care Topical Triamcinolone Discharge Instruction: Apply thin layer Triamcinolone to the wound bed Primary Dressing Hydrofera Blue Classic Foam, 4x4 in Discharge Instruction: Moisten with saline prior to applying to wound,cut to fit inside wound edges Secondary Dressing Zetuvit Plus Silicone Border Dressing 5x5 (in/in) Discharge Instruction: Apply silicone border over primary dressing , or may use gauze and secure with rolled gauze Secured With Compression Wrap Compression Stockings Add-Ons Electronic Signature(s) Signed: 06/24/2020 5:19:11 PM By: Sandre Kitty Signed: 06/24/2020 6:04:38 PM By: Rhae Hammock RN Entered By: Sandre Kitty on 06/24/2020 16:35:10 -------------------------------------------------------------------------------- Vitals Details Patient Name: Date of Service: [name], RA NDO [facility] 06/24/2020 1:15 PM Medical Record Number: [id number] Patient Account Number: [id number] Date of Birth/Sex: Treating RN: 12-25-1970 (50 y.o. Burnadette Pop, [name] Primary Care Chyna Kneece: Stoney Bang Other Clinician: Referring Kevonta Phariss: Treating Oksana Deberry/Extender: [place] Breeding in Treatment: 7 Vital Signs Time Taken: 13:17 Temperature (F): 98.7 Height (in): 72 Pulse (bpm): 87 Weight (lbs): 304 Respiratory Rate (breaths/min): 17 Body Mass Index (BMI): 41.2 Blood Pressure (mmHg): 110/72 Reference Range: 80 - 120 mg / dl Electronic Signature(s) Signed: 06/24/2020 6:04:38 PM By: Rhae Hammock RN Entered By: Rhae Hammock on 06/24/2020 13:18:36

## 2020-06-25 DIAGNOSIS — B965 Pseudomonas (aeruginosa) (mallei) (pseudomallei) as the cause of diseases classified elsewhere: Secondary | ICD-10-CM | POA: Diagnosis not present

## 2020-06-25 DIAGNOSIS — B9562 Methicillin resistant Staphylococcus aureus infection as the cause of diseases classified elsewhere: Secondary | ICD-10-CM | POA: Diagnosis not present

## 2020-06-25 DIAGNOSIS — E11621 Type 2 diabetes mellitus with foot ulcer: Secondary | ICD-10-CM | POA: Diagnosis not present

## 2020-06-25 DIAGNOSIS — L97412 Non-pressure chronic ulcer of right heel and midfoot with fat layer exposed: Secondary | ICD-10-CM | POA: Diagnosis not present

## 2020-06-25 DIAGNOSIS — E1169 Type 2 diabetes mellitus with other specified complication: Secondary | ICD-10-CM | POA: Diagnosis not present

## 2020-06-25 DIAGNOSIS — M869 Osteomyelitis, unspecified: Secondary | ICD-10-CM | POA: Diagnosis not present

## 2020-06-29 DIAGNOSIS — M869 Osteomyelitis, unspecified: Secondary | ICD-10-CM | POA: Diagnosis not present

## 2020-06-29 DIAGNOSIS — E1169 Type 2 diabetes mellitus with other specified complication: Secondary | ICD-10-CM | POA: Diagnosis not present

## 2020-06-29 DIAGNOSIS — B9562 Methicillin resistant Staphylococcus aureus infection as the cause of diseases classified elsewhere: Secondary | ICD-10-CM | POA: Diagnosis not present

## 2020-06-29 DIAGNOSIS — B965 Pseudomonas (aeruginosa) (mallei) (pseudomallei) as the cause of diseases classified elsewhere: Secondary | ICD-10-CM | POA: Diagnosis not present

## 2020-06-29 DIAGNOSIS — L97412 Non-pressure chronic ulcer of right heel and midfoot with fat layer exposed: Secondary | ICD-10-CM | POA: Diagnosis not present

## 2020-06-29 DIAGNOSIS — E11621 Type 2 diabetes mellitus with foot ulcer: Secondary | ICD-10-CM | POA: Diagnosis not present

## 2020-07-01 ENCOUNTER — Other Ambulatory Visit: Payer: Self-pay

## 2020-07-01 ENCOUNTER — Encounter (HOSPITAL_BASED_OUTPATIENT_CLINIC_OR_DEPARTMENT_OTHER): Payer: Medicare Other | Admitting: Physician Assistant

## 2020-07-01 DIAGNOSIS — L97529 Non-pressure chronic ulcer of other part of left foot with unspecified severity: Secondary | ICD-10-CM | POA: Diagnosis not present

## 2020-07-01 DIAGNOSIS — L97512 Non-pressure chronic ulcer of other part of right foot with fat layer exposed: Secondary | ICD-10-CM | POA: Diagnosis not present

## 2020-07-01 DIAGNOSIS — Z89411 Acquired absence of right great toe: Secondary | ICD-10-CM | POA: Diagnosis not present

## 2020-07-01 DIAGNOSIS — I110 Hypertensive heart disease with heart failure: Secondary | ICD-10-CM | POA: Diagnosis not present

## 2020-07-01 DIAGNOSIS — E1142 Type 2 diabetes mellitus with diabetic polyneuropathy: Secondary | ICD-10-CM | POA: Diagnosis not present

## 2020-07-01 DIAGNOSIS — E11621 Type 2 diabetes mellitus with foot ulcer: Secondary | ICD-10-CM | POA: Diagnosis not present

## 2020-07-01 DIAGNOSIS — Z794 Long term (current) use of insulin: Secondary | ICD-10-CM | POA: Diagnosis not present

## 2020-07-01 DIAGNOSIS — I5042 Chronic combined systolic (congestive) and diastolic (congestive) heart failure: Secondary | ICD-10-CM | POA: Diagnosis not present

## 2020-07-01 DIAGNOSIS — F17218 Nicotine dependence, cigarettes, with other nicotine-induced disorders: Secondary | ICD-10-CM | POA: Diagnosis not present

## 2020-07-01 DIAGNOSIS — E1151 Type 2 diabetes mellitus with diabetic peripheral angiopathy without gangrene: Secondary | ICD-10-CM | POA: Diagnosis not present

## 2020-07-01 NOTE — Progress Notes (Signed)
MYLO, CHOI ([id number]) Visit Report for 07/01/2020 Chief Complaint Document Details Patient Name: Date of Service: [name], [name] [facility] 07/01/2020 2:30 PM Medical Record Number: [id number] Patient Account Number: [id number] Date of Birth/Sex: Treating RN: July 02, 1970 (50 y.o. [name] Primary Care Provider: [name] Other Clinician: Referring Provider: Treating Provider/Extender: [name] in Treatment: 8 Information Obtained from: Patient Chief Complaint Right foot ulcer Electronic Signature(s) Signed: 07/01/2020 2:50:38 PM By: Lenda Kelp PA-C Entered By: Lenda Kelp on 07/01/2020 14:50:37 -------------------------------------------------------------------------------- Debridement Details Patient Name: Date of Service: [name], [name] [facility] 07/01/2020 2:30 PM Medical Record Number: [id number] Patient Account Number: [id number] Date of Birth/Sex: Treating RN: 06/10/1970 (50 y.o. [name] Primary Care Provider: [name] Other Clinician: Referring Provider: Treating Provider/Extender: [name] in Treatment: 8 Debridement Performed for Assessment: Wound #3 Right,Medial Foot Performed By: Physician Lenda Kelp, PA Debridement Type: Debridement Severity of Tissue Pre Debridement: Fat layer exposed Level of Consciousness (Pre-procedure): Awake and Alert Pre-procedure Verification/Time Out Yes - 14:40 Taken: Start Time: 14:41 Pain Control: Other : benzocaine 20and spray T Area Debrided (L x W): otal 2.1 (cm) x 1.5 (cm) = 3.15 (cm) Tissue and other material debrided: Viable, Non-Viable, Callus, Slough, Subcutaneous, Skin: Epidermis, Slough Level: Skin/Subcutaneous Tissue Debridement Description: Excisional Instrument: Curette Bleeding: Minimum Hemostasis Achieved: Pressure End Time: 14:46 Procedural Pain: 0 Post Procedural Pain: 0 Response to Treatment: Procedure  was tolerated well Level of Consciousness (Post- Awake and Alert procedure): Post Debridement Measurements of Total Wound Length: (cm) 2.1 Width: (cm) 1.5 Depth: (cm) 0.5 Volume: (cm) 1.237 Character of Wound/Ulcer Post Debridement: Improved Severity of Tissue Post Debridement: Fat layer exposed Post Procedure Diagnosis Same as Pre-procedure Electronic Signature(s) Signed: 07/01/2020 6:34:46 PM By: [name] RN, BSN Signed: 07/01/2020 6:55:53 PM By: Lenda Kelp PA-C Entered By: [name] on 07/01/2020 14:47:55 -------------------------------------------------------------------------------- HPI Details Patient Name: Date of Service: [name], [name] [facility] 07/01/2020 2:30 PM Medical Record Number: [id number] Patient Account Number: [id number] Date of Birth/Sex: Treating RN: 1970/07/04 (50 y.o. [name] Primary Care Provider: [name] Other Clinician: Referring Provider: Treating Provider/Extender: [name] in Treatment: 8 History of Present Illness HPI Description: ADMISSION 05/13/2019 This is a 50 year old man who has type 2 diabetes with peripheral neuropathy. He has a history of wounds on the plantar aspect of his left first and fourth toes. He has had these for several months. He was being seen in the wound care center at [facility] in [facility]. He apparently presented with swelling and an open wound at the tip of the toe. An MRI showed osteomyelitis. He was given 6 to 8 weeks of oral doxycycline again all of this via the patient we have none of these records. Since then he has been putting Goldbond on the areas wearing regular shoes. He was seen by his primary physician on 4/9 and sent down here for our review. In the primary care note it says he has been recommended for hyperbaric oxygen. Past medical history includes type 2 diabetes with peripheral neuropathy, heart failure with reduced ejection  fraction 30 to 35%, peripheral arterial disease, coronary artery disease, hyperlipidemia, hypertension, syncope and a prior history of a right great toe amputation also by Dr. Lynden Ang in [place] ABIs in our clinic were 0.91 on the left. Also notable that in 2019 he appears to have had arterial studies that  are visible in our system. At that point the right ABI was 1.17, TBI of 0.85 with triphasic waveforms. On the left his ABI was 1.19 with a TBI of 0.73 again with triphasic waveform 5/3; we did receive some information from [facility] wound care. The patient was seen there on 1/29. At that point he had wounds on the right foot at the amputation site the fifth digit fourth digit I am presuming on the right and then an area on the left first digit although that is not specifically stated, he has had a previous amputation on the right however. It would appear him at that time most of his ulcers were on the right the left foot is stated to have a lot of scales and calluses but not a lot of open wounds. The only wounds we define when he came in here last time were on the left first and left fourth toe He also had a wound culture that showed heavy growth of staph aureus and a slight growth of Stenotrophomonas maltophilia. The doxycycline should cover the staph aureus I am not sure about the stenotrophomonas. In any case he completed 6 weeks of this. UNFORTUNATELY I still do not have a copy of the MRI. And the exact justification for hyperbarics is therefore lacking. 5/10; the patient had osteomyelitis in the left fourth toe. He was treated for 6 weeks of doxycycline this wound is healed as is the left first toe. He was sent here for hyperbaric oxygen however at this point his wounds are healed and I think a course of watchful waiting and observation is in order. If the left fourth toe reopens then it is likely he will need a more protracted and aggressive treatment approach Readmission: 05/06/20 upon  evaluation today patient presents for readmission here in the clinic exam issues with his right foot on the medial aspect at the amputation site where his great toe was this is the first metatarsal area that is affected. He has been seeing Dr. Marcha Solders who performed the surgery. With that being said currently the patient was diagnosed as having MRSA on a culture that was + March 31. Has been on IV vancomycin for 6 weeks he is now on doxycycline as the MRSA was sensitive to Doxy and he does have 2 more refills he has been on that for 3 weeks already. He has MRI of January for showed L knee first metatarsal base early osteomyelitis versus possible reactive disease but nonetheless based on what was seen it appears osteomyelitis is more likely the cause here. He has had Apligraf although to be honest that did not seem to do the job. He is also been using Dakin's solution and currently is using Aquacel. Sharp debridement has been performed by Dr. Marcha Solders on a weekly basis according to what the patient tells me. With all that being said the patient was referred to [place] for further evaluation and treatment as unfortunately he does not seem to be responding well to his treatment with the surgeon. They have considered a total contact cast it sounds like but again with the infection that this was not a good idea. The patient does have a significant past medical history for diabetes mellitus type 2 for which she is on insulin, hypertension, significant congestive heart failure with a most recent ejection fraction of 30 to 35% and that was in 2020. He has not seen his cardiologist [name] since that time. In regard to his diabetes his A1c he  does not even know he tells me his blood sugars run somewhere in the 200-300 range. With that being said he has not seen the primary care provider recently for management of this either. The patient tells me he is also a current smoker. He tells me he is trying to stop using  nicotine pouches but he just does not have enough saliva he tells me his mouth is dry all the time this is probably a subsequent issue from the diabetes as well. Unfortunately he just appears to be in a place where he is not necessarily at his healthiest even with his medical conditions. I think he would be a candidate for hyperbaric oxygen therapy but there are a lot of other things that need to be in place first before we get to that point. 05/13/2020 patient presents for 1 week follow-up. He has been using Hydrofera Blue every other day without any issues. He had to reschedule his cardiologist appointment due to transportation Issues. He has no complaints today. 05/20/2020 upon evaluation today patient appears to be doing well with regard to his foot ulcer all things considered. With that being said he tells me that he has been tolerating the dressing changes without complication. There does not appear to be any signs of active infection which is great news and overall very pleased with where things stand today. No fevers, chills, nausea, vomiting, or diarrhea. 05/27/2020 upon evaluation today patient appears to be doing well with regard to his wound on the foot. He does not require little bit of debridement today but overall he seems to be doing quite well. He actually has his echo on Monday we will see what that shows as well were hoping to be able to get him into the hyperbaric oxygen chamber but again a lot depends on what this test shows he will also need a chest x-ray if this turns out okay before going in the chamber. 06/03/2020 upon evaluation today patient's wound actually showing signs of minimal improvement. Fortunately I think that he is continuing to make great progress all things considered. He does have his echocardiogram next week. That is good news so we can see whether or not hyperbaric oxygen therapy would be appropriate for him I am hopeful we will be able to get him in the chamber  sooner rather than later but again it all depends on his ejection fraction. Right now we have been somewhat limited in being able to proceed with that as to be perfectly honest we just have not gotten the needed testing in order to go forward with the hyperbarics. If his ejection fraction is too low which could be the case then obviously is not good to be able to 5/25; patient presents for 1 week follow-up. He states he has his echo set up for next week. He overall feels well. He denies any issues over the past week. He denies signs of infection. 06/17/2020 upon evaluation today patient appears to be doing about the same in regard to his foot ulcer is measuring a little bit smaller today but still keeps developing the spongy tissue. I really think it may be a good idea for [place] to obtain a sample of a punch biopsy to send for evaluation. He is in agreement with this plan. Subsequently he did have his echocardiogram which showed that he has a systolic left ventricular function which is moderately decreased estimated to be 30 to 35%. He does have a follow-up appointment with the cardiologist on  Monday to see what they think about this. I advised that he needs to talk to them as well that what they feel about him potentially going into hyperbaric oxygen therapy if his heart would be sufficient for this or not. Obviously if they feel that it is something that would be sufficient then we need to have this in writing. Otherwise patient tells me that he has been doing fairly well is not really having any significant pain. 06/24/2020 upon evaluation today patient appears to be doing decently well in regard to his wound. In fact he does not have as much of the tissue that grew back this time. I do see that he had the results back from his skin biopsy. This showed reactive squamous hyperplasia. This again seems to be somewhat similar to lichen planus and how that would function. With that being said there was no  malignancy and potentially this means it could be a result and a response to like a topical steroid like triamcinolone. I believe this may at least be something to get a shot at this point. The patient is in agreement with the plan. I am very pleased though there is no malignancy noted. We also got a letter back from his cardiologist stating that he is from a cardiac perspective able to participate in hyperbaric oxygen therapy. Therefore if need be I think we can proceed as such. For that reason I am going to send in for the chest x-ray in order to ensure that we have what we need in place if we do need to proceed down that road. 07/01/2020 upon evaluation today patient appears to be doing decently well in regard to his wound. He has been tolerating the dressing changes without complication. Fortunately there does not appear to be any signs of active infection which is great news. No fever chills noted. He does note that he had a little bit of increased pain over the past week this seems to be kind of a neuropathy type issue to be honest. It does seem to be coming from his wound and I do not think it is coming from anywhere else in his foot although he does note some referred pain that seems to be traveling a little bit on him. Overall I think that in general this is just more of a bit of a mind game in that regard not that he is not hurting but rather where he is experiencing the pain at. Either way I do believe that the wound is doing much better as compared to what it was previous. Electronic Signature(s) Signed: 07/01/2020 2:50:49 PM By: Lenda Kelp PA-C Entered By: Lenda Kelp on 07/01/2020 14:50:49 -------------------------------------------------------------------------------- Physical Exam Details Patient Name: Date of Service: [name], [name] [facility] 07/01/2020 2:30 PM Medical Record Number: [id number] Patient Account Number: [id number] Date of Birth/Sex: Treating RN: 16-Jan-1971 (50  y.o. [name] Primary Care Provider: [name] Other Clinician: Referring Provider: Treating Provider/Extender: [name] in Treatment: 8 Constitutional Well-nourished and well-hydrated in no acute distress. Respiratory normal breathing without difficulty. Psychiatric this patient is able to make decisions and demonstrates good insight into disease process. Alert and Oriented x 3. pleasant and cooperative. Notes Upon inspection patient's wound bed actually showed signs of good granulation epithelization at this point. There does not appear to be any evidence of active infection at this time which is great and overall I am extremely pleased with where things stand at this  point. No fevers, chills, nausea, vomiting, or diarrhea. I did perform sharp debridement to clear away some of the overgrown tissue around the edges of the wound he tolerated that today with minimal discomfort fortunately postdebridement wound bed appears to be doing much better. Electronic Signature(s) Signed: 07/01/2020 2:51:52 PM By: Lenda Kelp PA-C Entered By: Lenda Kelp on 07/01/2020 14:51:51 -------------------------------------------------------------------------------- Physician Orders Details Patient Name: Date of Service: [name], [name] [facility] 07/01/2020 2:30 PM Medical Record Number: [id number] Patient Account Number: [id number] Date of Birth/Sex: Treating RN: [birth year] (50 y.o. [name] Primary Care Provider: [name] Other Clinician: Referring Provider: Treating Provider/Extender: [name] in Treatment: 8 Verbal / Phone Orders: No Diagnosis Coding ICD-10 Coding Code Description E11.621 Type 2 diabetes mellitus with foot ulcer L97.512 Non-pressure chronic ulcer of other part of right foot with fat layer exposed E11.42 Type 2 diabetes mellitus with diabetic polyneuropathy I10 Essential (primary)  hypertension I50.42 Chronic combined systolic (congestive) and diastolic (congestive) heart failure F17.218 Nicotine dependence, cigarettes, with other nicotine-induced disorders Follow-up Appointments ppointment in 1 week. - with Leonard Schwartz Return A Bathing/ Shower/ Hygiene May shower with protection but do not get wound dressing(s) wet. Off-Loading Wedge shoe to: - right foot to ambulate Additional Orders / Instructions Stop/Decrease Smoking Follow Nutritious Diet Home Health No change in wound care orders this week; continue Home Health for wound care. May utilize formulary equivalent dressing for wound treatment orders unless otherwise specified. Other Home Health Orders/Instructions: - [facility] Wound Treatment Wound #3 - Foot Wound Laterality: Right, Medial Topical: Triamcinolone 1 x Per Day/30 Days Discharge Instructions: Apply thin layer Triamcinolone to the wound bed Prim Dressing: Hydrofera Blue Classic Foam, 4x4 in 1 x Per Day/30 Days ary Discharge Instructions: Moisten with saline prior to applying to wound,cut to fit inside wound edges Secondary Dressing: Zetuvit Plus Silicone Border Dressing 5x5 (in/in) 1 x Per Day/30 Days Discharge Instructions: Apply silicone border over primary dressing , or may use gauze and secure with rolled gauze Electronic Signature(s) Signed: 07/01/2020 6:34:46 PM By: [name] RN, BSN Signed: 07/01/2020 6:55:53 PM By: Lenda Kelp PA-C Entered By: [name] on 07/01/2020 14:50:11 -------------------------------------------------------------------------------- Problem List Details Patient Name: Date of Service: [name], [name] [facility] 07/01/2020 2:30 PM Medical Record Number: [id number] Patient Account Number: [id number] Date of Birth/Sex: Treating RN: Jun 05, 1970 (50 y.o. Bayard Hugger, Bonita Quin Primary Care Provider: [name] Other Clinician: Referring Provider: Treating Provider/Extender: [name]  in Treatment: 8 Active Problems ICD-10 Encounter Code Description Active Date MDM Diagnosis E11.621 Type 2 diabetes mellitus with foot ulcer 05/06/2020 No Yes L97.512 Non-pressure chronic ulcer of other part of right foot with fat layer exposed 05/06/2020 No Yes E11.42 Type 2 diabetes mellitus with diabetic polyneuropathy 05/06/2020 No Yes I10 Essential (primary) hypertension 05/06/2020 No Yes I50.42 Chronic combined systolic (congestive) and diastolic (congestive) heart failure 05/06/2020 No Yes F17.218 Nicotine dependence, cigarettes, with other nicotine-induced disorders 05/06/2020 No Yes Inactive Problems Resolved Problems Electronic Signature(s) Signed: 07/01/2020 2:50:29 PM By: Lenda Kelp PA-C Entered By: Lenda Kelp on 07/01/2020 14:50:29 -------------------------------------------------------------------------------- Progress Note Details Patient Name: Date of Service: [name], [name] [facility] 07/01/2020 2:30 PM Medical Record Number: [id number] Patient Account Number: [id number] Date of Birth/Sex: Treating RN: Dec 20, 1970 (50 y.o. [name] Primary Care Provider: [name] Other Clinician: Referring Provider: Treating Provider/Extender: [name] in Treatment: 8 Subjective Chief Complaint Information obtained from  Patient Right foot ulcer History of Present Illness (HPI) ADMISSION 05/13/2019 This is a 50 year old man who has type 2 diabetes with peripheral neuropathy. He has a history of wounds on the plantar aspect of his left first and fourth toes. He has had these for several months. He was being seen in [facility] [place]. He apparently presented with swelling and an open wound at the tip of the toe. An MRI showed osteomyelitis. He was given 6 to 8 weeks of oral doxycycline again all of this via the patient we have none of these records. Since then he has been putting Goldbond on  the areas wearing regular shoes. He was seen by his primary physician on 4/9 and sent down here for our review. In the primary care note it says he has been recommended for hyperbaric oxygen. Past medical history includes type 2 diabetes with peripheral neuropathy, heart failure with reduced ejection fraction 30 to 35%, peripheral arterial disease, coronary artery disease, hyperlipidemia, hypertension, syncope and a prior history of a right great toe amputation also [place] ABIs in our clinic were 0.91 on the left. Also notable that in 2019 he appears to have had arterial studies that are visible in our system. At that point the right ABI was 1.17, TBI of 0.85 with triphasic waveforms. On the left his ABI was 1.19 with a TBI of 0.73 again with triphasic waveform 5/3; we did receive [facility] information [facility]. The patient was seen there on 1/29. At that point he had wounds on the right foot at the amputation site the fifth digit fourth digit I am presuming on the right and then an area on the left first digit although that is not specifically stated, he has had a previous amputation on the right however. It would appear him at that time most of his ulcers were on the right the left foot is stated to have a lot of scales and calluses but not a lot of open wounds. The only wounds we define when he came in here last time were on the left first and left fourth toe He also had a wound culture that showed heavy growth of staph aureus and a slight growth of Stenotrophomonas maltophilia. The doxycycline should cover the staph aureus I am not sure about the stenotrophomonas. In any case he completed 6 weeks of this. UNFORTUNATELY I still do not have a copy of the MRI. And the exact justification for hyperbarics is therefore lacking. 5/10; the patient had osteomyelitis in the left fourth toe. He was treated for 6 weeks of doxycycline this wound is healed as is the left first toe.  He was sent here for hyperbaric oxygen however at this point his wounds are healed and I think a course of watchful waiting and observation is in order. If the left fourth toe reopens then it is likely he will need a more protracted and aggressive treatment approach Readmission: 05/06/20 upon evaluation today patient presents for readmission here in the clinic exam issues with his right foot on the medial aspect at the amputation site where his great toe was this is the first metatarsal area that is affected. He has been seeing Dr. Marcha Soldersathey who performed the surgery. With that being said currently the patient was diagnosed as having MRSA on a culture that was + March 31. Has been on IV vancomycin for 6 weeks he is now on doxycycline as  the MRSA was sensitive to Doxy and he does have 2 more refills he has been on that for 3 weeks already. He has MRI of January for showed L knee first metatarsal base early osteomyelitis versus possible reactive disease but nonetheless based on what was seen it appears osteomyelitis is more likely the cause here. He has had Apligraf although to be honest that did not seem to do the job. He is also been using Dakin's solution and currently is using Aquacel. Sharp debridement has been performed by Dr. Marcha Solders on a weekly basis according to what the patient tells me. With all that being said the patient was referred to [place] for further evaluation and treatment as unfortunately he does not seem to be responding well to his treatment with the surgeon. They have considered a total contact cast it sounds like but again with the infection that this was not a good idea. The patient does have a significant past medical history for diabetes mellitus type 2 for which she is on insulin, hypertension, significant congestive heart failure with a most recent ejection fraction of 30 to 35% and that was in 2020. He has not seen his cardiologist [name] since that time. In regard to  his diabetes his A1c he does not even know he tells me his blood sugars run somewhere in the 200-300 range. With that being said he has not seen the primary care provider recently for management of this either. The patient tells me he is also a current smoker. He tells me he is trying to stop using nicotine pouches but he just does not have enough saliva he tells me his mouth is dry all the time this is probably a subsequent issue from the diabetes as well. Unfortunately he just appears to be in a place where he is not necessarily at his healthiest even with his medical conditions. I think he would be a candidate for hyperbaric oxygen therapy but there are a lot of other things that need to be in place first before we get to that point. 05/13/2020 patient presents for 1 week follow-up. He has been using Hydrofera Blue every other day without any issues. He had to reschedule his cardiologist appointment due to transportation Issues. He has no complaints today. 05/20/2020 upon evaluation today patient appears to be doing well with regard to his foot ulcer all things considered. With that being said he tells me that he has been tolerating the dressing changes without complication. There does not appear to be any signs of active infection which is great news and overall very pleased with where things stand today. No fevers, chills, nausea, vomiting, or diarrhea. 05/27/2020 upon evaluation today patient appears to be doing well with regard to his wound on the foot. He does not require little bit of debridement today but overall he seems to be doing quite well. He actually has his echo on Monday we will see what that shows as well were hoping to be able to get him into the hyperbaric oxygen chamber but again a lot depends on what this test shows he will also need a chest x-ray if this turns out okay before going in the chamber. 06/03/2020 upon evaluation today patient's wound actually showing signs of minimal  improvement. Fortunately I think that he is continuing to make great progress all things considered. He does have his echocardiogram next week. That is good news so we can see whether or not hyperbaric oxygen therapy would be appropriate for  him I am hopeful we will be able to get him in the chamber sooner rather than later but again it all depends on his ejection fraction. Right now we have been somewhat limited in being able to proceed with that as to be perfectly honest we just have not gotten the needed testing in order to go forward with the hyperbarics. If his ejection fraction is too low which could be the case then obviously is not good to be able to 5/25; patient presents for 1 week follow-up. He states he has his echo set up for next week. He overall feels well. He denies any issues over the past week. He denies signs of infection. 06/17/2020 upon evaluation today patient appears to be doing about the same in regard to his foot ulcer is measuring a little bit smaller today but still keeps developing the spongy tissue. I really think it may be a good idea for [place] to obtain a sample of a punch biopsy to send for evaluation. He is in agreement with this plan. Subsequently he did have his echocardiogram which showed that he has a systolic left ventricular function which is moderately decreased estimated to be 30 to 35%. He does have a follow-up appointment with the cardiologist on Monday to see what they think about this. I advised that he needs to talk to them as well that what they feel about him potentially going into hyperbaric oxygen therapy if his heart would be sufficient for this or not. Obviously if they feel that it is something that would be sufficient then we need to have this in writing. Otherwise patient tells me that he has been doing fairly well is not really having any significant pain. 06/24/2020 upon evaluation today patient appears to be doing decently well in regard to his wound.  In fact he does not have as much of the tissue that grew back this time. I do see that he had the results back from his skin biopsy. This showed reactive squamous hyperplasia. This again seems to be somewhat similar to lichen planus and how that would function. With that being said there was no malignancy and potentially this means it could be a result and a response to like a topical steroid like triamcinolone. I believe this may at least be something to get a shot at this point. The patient is in agreement with the plan. I am very pleased though there is no malignancy noted. We also got a letter back from his cardiologist stating that he is from a cardiac perspective able to participate in hyperbaric oxygen therapy. Therefore if need be I think we can proceed as such. For that reason I am going to send in for the chest x-ray in order to ensure that we have what we need in place if we do need to proceed down that road. 07/01/2020 upon evaluation today patient appears to be doing decently well in regard to his wound. He has been tolerating the dressing changes without complication. Fortunately there does not appear to be any signs of active infection which is great news. No fever chills noted. He does note that he had a little bit of increased pain over the past week this seems to be kind of a neuropathy type issue to be honest. It does seem to be coming from his wound and I do not think it is coming from anywhere else in his foot although he does note some referred pain that seems to be traveling a  little bit on him. Overall I think that in general this is just more of a bit of a mind game in that regard not that he is not hurting but rather where he is experiencing the pain at. Either way I do believe that the wound is doing much better as compared to what it was previous. Objective Constitutional Well-nourished and well-hydrated in no acute distress. Vitals Time Taken: 2:10 PM, Height: 72 in,  Weight: 304 lbs, BMI: 41.2, Temperature: 97.7 F, Pulse: 73 bpm, Respiratory Rate: 17 breaths/min, Blood Pressure: 115/74 mmHg. Respiratory normal breathing without difficulty. Psychiatric this patient is able to make decisions and demonstrates good insight into disease process. Alert and Oriented x 3. pleasant and cooperative. General Notes: Upon inspection patient's wound bed actually showed signs of good granulation epithelization at this point. There does not appear to be any evidence of active infection at this time which is great and overall I am extremely pleased with where things stand at this point. No fevers, chills, nausea, vomiting, or diarrhea. I did perform sharp debridement to clear away some of the overgrown tissue around the edges of the wound he tolerated that today with minimal discomfort fortunately postdebridement wound bed appears to be doing much better. Integumentary (Hair, Skin) Wound #3 status is Open. Original cause of wound was Gradually Appeared. The date acquired was: 07/18/2019. The wound has been in treatment 8 weeks. The wound is located on the Right,Medial Foot. The wound measures 2.1cm length x 1.5cm width x 0.5cm depth; 2.474cm^2 area and 1.237cm^3 volume. There is Fat Layer (Subcutaneous Tissue) exposed. There is no tunneling or undermining noted. There is a medium amount of serosanguineous drainage noted. The wound margin is thickened. There is small (1-33%) red, pink granulation within the wound bed. There is a large (67-100%) amount of necrotic tissue within the wound bed including Eschar and Adherent Slough. Assessment Active Problems ICD-10 Type 2 diabetes mellitus with foot ulcer Non-pressure chronic ulcer of other part of right foot with fat layer exposed Type 2 diabetes mellitus with diabetic polyneuropathy Essential (primary) hypertension Chronic combined systolic (congestive) and diastolic (congestive) heart failure Nicotine dependence,  cigarettes, with other nicotine-induced disorders Procedures Wound #3 Pre-procedure diagnosis of Wound #3 is a Diabetic Wound/Ulcer of the Lower Extremity located on the Right,Medial Foot .Severity of Tissue Pre Debridement is: Fat layer exposed. There was a Excisional Skin/Subcutaneous Tissue Debridement with a total area of 3.15 sq cm performed by Lenda Kelp, PA. With the following instrument(s): Curette to remove Viable and Non-Viable tissue/material. Material removed includes Callus, Subcutaneous Tissue, Slough, and Skin: Epidermis after achieving pain control using Other (benzocaine 20and spray). No specimens were taken. A time out was conducted at 14:40, prior to the start of the procedure. A Minimum amount of bleeding was controlled with Pressure. The procedure was tolerated well with a pain level of 0 throughout and a pain level of 0 following the procedure. Post Debridement Measurements: 2.1cm length x 1.5cm width x 0.5cm depth; 1.237cm^3 volume. Character of Wound/Ulcer Post Debridement is improved. Severity of Tissue Post Debridement is: Fat layer exposed. Post procedure Diagnosis Wound #3: Same as Pre-Procedure Plan Follow-up Appointments: Return Appointment in 1 week. - with Luana Shu Shower/ Hygiene: May shower with protection but do not get wound dressing(s) wet. Off-Loading: Wedge shoe to: - right foot to ambulate Additional Orders / Instructions: Stop/Decrease Smoking Follow Nutritious Diet Home Health: No change in wound care orders this week; continue Home Health for wound care. May utilize  formulary equivalent dressing for wound treatment orders unless otherwise specified. Other Home Health Orders/Instructions: - [place] WOUND #3: - Foot Wound Laterality: Right, Medial Topical: Triamcinolone 1 x Per Day/30 Days Discharge Instructions: Apply thin layer Triamcinolone to the wound bed Prim Dressing: Hydrofera Blue Classic Foam, 4x4 in 1 x Per Day/30  Days ary Discharge Instructions: Moisten with saline prior to applying to wound,cut to fit inside wound edges Secondary Dressing: Zetuvit Plus Silicone Border Dressing 5x5 (in/in) 1 x Per Day/30 Days Discharge Instructions: Apply silicone border over primary dressing , or may use gauze and secure with rolled gauze 1. Would recommend currently that we going continue with the wound care measures as before and the patient is in agreement with the plan. This includes the use of the [facility] dressing. He is using a little bit of triamcinolone up underneath this which I previously prescribed for him. 2. I am also can recommend that we continue with the daily dressing changes which I think is the best way to go as far as that is concerned with the ointment especially. 3. I am also can recommend that we have the patient cover this with a border foam dressing which I do believe is doing well for him. 4. We have contemplated hyperbaric oxygen therapy but he is wanting to wait and see how things do with the current measures before we proceed in that realm. He also needs an x-ray before we would go down that road. We will see patient back for reevaluation in 1 week here in the clinic. If anything worsens or changes patient will contact our office for additional recommendations. Electronic Signature(s) Signed: 07/01/2020 2:53:13 PM By: Lenda Kelp PA-C Entered By: Lenda Kelp on 07/01/2020 14:53:13 -------------------------------------------------------------------------------- SuperBill Details Patient Name: Date of Service: [name], [name] [facility] 07/01/2020 Medical Record Number: [id number] Patient Account Number: [id number] Date of Birth/Sex: Treating RN: April 13, 1970 (50 y.o. [name] Primary Care Provider: [name] Other Clinician: Referring Provider: Treating Provider/Extender: [name] in Treatment: 8 Diagnosis Coding ICD-10 Codes Code  Description [phone] Type 2 diabetes mellitus with foot ulcer L97.512 Non-pressure chronic ulcer of other part of right foot with fat layer exposed E11.42 Type 2 diabetes mellitus with diabetic polyneuropathy I10 Essential (primary) hypertension I50.42 Chronic combined systolic (congestive) and diastolic (congestive) heart failure F17.218 Nicotine dependence, cigarettes, with other nicotine-induced disorders Facility Procedures CPT4 Code: [id number] Description: 11042 - DEB SUBQ TISSUE 20 SQ CM/< ICD-10 Diagnosis Description L97.512 Non-pressure chronic ulcer of other part of right foot with fat layer exposed Modifier: Quantity: 1 Physician Procedures : CPT4 Code Description Modifier [id number] 11042 - WC PHYS SUBQ TISS 20 SQ CM ICD-10 Diagnosis Description L97.512 Non-pressure chronic ulcer of other part of right foot with fat layer exposed Quantity: 1 Electronic Signature(s) Signed: 07/01/2020 2:53:21 PM By: Lenda Kelp PA-C Entered By: Lenda Kelp on 07/01/2020 14:53:21

## 2020-07-02 NOTE — Progress Notes (Signed)
CORAL, SOLER ([id number]) Visit Report for 07/01/2020 Arrival Information Details Patient Name: Date of Service: [name] [name] [facility] 07/01/2020 2:30 PM Medical Record Number: [id number] Patient Account Number: [id number] Date of Birth/Sex: Treating RN: [birth year] (50 y.o. [name] Primary Care Reichen Hutzler: Stoney Bang Other Clinician: Referring Earl Zellmer: Treating Domini Vandehei/Extender: Dallas Breeding in Treatment: 8 Visit Information History Since Last Visit Added or deleted any medications: No Patient Arrived: Ambulatory Any new allergies or adverse reactions: No Arrival Time: 14:10 Had a fall or experienced change in No Accompanied By: self activities of daily living that may affect Transfer Assistance: None risk of falls: Patient Identification Verified: Yes Signs or symptoms of abuse/neglect since last visito No Secondary Verification Process Completed: Yes Hospitalized since last visit: No Patient Requires Transmission-Based Precautions: No Implantable device outside of the clinic excluding No Patient Has Alerts: No cellular tissue based products placed in the center since last visit: Has Dressing in Place as Prescribed: Yes Pain Present Now: Yes Electronic Signature(s) Signed: 07/02/2020 10:52:16 AM By: Sandre Kitty Entered By: Sandre Kitty on 07/01/2020 14:10:47 -------------------------------------------------------------------------------- Encounter Discharge Information Details Patient Name: Date of Service: [name], [name] [facility] 07/01/2020 2:30 PM Medical Record Number: [id number] Patient Account Number: [id number] Date of Birth/Sex: Treating RN: 1970-02-25 (50 y.o. Burnadette Pop, [name] Primary Care Beda Dula: Stoney Bang Other Clinician: Referring Darra Rosa: Treating Conya Ellinwood/Extender: [place] Breeding in Treatment: 8 Encounter Discharge Information Items Post Procedure Vitals Discharge  Condition: Stable Temperature (F): 98.7 Ambulatory Status: Ambulatory Pulse (bpm): 73 Discharge Destination: Home Respiratory Rate (breaths/min): 17 Transportation: Private Auto Blood Pressure (mmHg): 115/74 Accompanied By: self Schedule Follow-up Appointment: Yes Clinical Summary of Care: Patient Declined Electronic Signature(s) Signed: 07/01/2020 6:22:58 PM By: Rhae Hammock RN Entered By: Rhae Hammock on 07/01/2020 15:14:28 -------------------------------------------------------------------------------- Lower Extremity Assessment Details Patient Name: Date of Service: [name], [name] [facility] 07/01/2020 2:30 PM Medical Record Number: [id number] Patient Account Number: [id number] Date of Birth/Sex: Treating RN: 12-03-1970 (50 y.o. Hessie Diener Primary Care Olena Willy: Stoney Bang Other Clinician: Referring Channa Hazelett: Treating Georgiana Spillane/Extender: [place] Breeding in Treatment: 8 Edema Assessment Assessed: [Left: No] [Right: Yes] Edema: [Left: N] [Right: o] Calf Left: Right: Point of Measurement: 34 cm From Medial Instep 42 cm Ankle Left: Right: Point of Measurement: 13 cm From Medial Instep 25 cm Vascular Assessment Pulses: Dorsalis Pedis Palpable: [Right:Yes] Electronic Signature(s) Signed: 07/02/2020 5:31:56 PM By: Deon Pilling Entered By: Deon Pilling on 07/01/2020 14:22:54 -------------------------------------------------------------------------------- Multi-Disciplinary Care Plan Details Patient Name: Date of Service: [name], [name] [facility] 07/01/2020 2:30 PM Medical Record Number: [id number] Patient Account Number: [id number] Date of Birth/Sex: Treating RN: 1970/08/26 (50 y.o. [name] Primary Care Ramiya Delahunty: Stoney Bang Other Clinician: Referring Stephinie Battisti: Treating Odie Edmonds/Extender: Dallas Breeding in Treatment: 8 Multidisciplinary Care Plan reviewed with physician Active  Inactive Nutrition Nursing Diagnoses: Impaired glucose control: actual or potential Potential for alteratiion in Nutrition/Potential for imbalanced nutrition Goals: Patient/caregiver will maintain therapeutic glucose control Date Initiated: 05/13/2020 Target Resolution Date: 07/08/2020 Goal Status: Active Interventions: Assess patient nutrition upon admission and as needed per policy Treatment Activities: Patient referred to Primary Care Physician for further nutritional evaluation : 05/13/2020 Notes: Osteomyelitis Nursing Diagnoses: Infection: osteomyelitis Knowledge deficit related to disease process and management Goals: Patient's osteomyelitis will resolve Date Initiated: 05/13/2020 Target Resolution Date: 07/08/2020 Goal Status: Active Interventions: Assess for signs and symptoms of osteomyelitis resolution every visit Provide education  on osteomyelitis Treatment Activities: Systemic antibiotics : 05/13/2020 T ordered outside of clinic : 05/13/2020 est Notes: Wound/Skin Impairment Nursing Diagnoses: Impaired tissue integrity Knowledge deficit related to ulceration/compromised skin integrity Goals: Patient/caregiver will verbalize understanding of skin care regimen Date Initiated: 05/13/2020 Target Resolution Date: 07/08/2020 Goal Status: Active Ulcer/skin breakdown will have a volume reduction of 30% by week 4 Date Initiated: 05/13/2020 Date Inactivated: 06/10/2020 Target Resolution Date: 06/03/2020 Goal Status: Met Ulcer/skin breakdown will have a volume reduction of 50% by week 8 Date Initiated: 06/10/2020 Target Resolution Date: 07/08/2020 Goal Status: Active Interventions: Assess patient/caregiver ability to obtain necessary supplies Assess patient/caregiver ability to perform ulcer/skin care regimen upon admission and as needed Assess ulceration(s) every visit Provide education on ulcer and skin care Treatment Activities: Skin care regimen initiated :  05/13/2020 Topical wound management initiated : 05/13/2020 Notes: Electronic Signature(s) Signed: 07/01/2020 6:34:46 PM By: Baruch Gouty RN, BSN Entered By: Baruch Gouty on 07/01/2020 14:43:34 -------------------------------------------------------------------------------- Pain Assessment Details Patient Name: Date of Service: [name], [name] [facility] 07/01/2020 2:30 PM Medical Record Number: [id number] Patient Account Number: [id number] Date of Birth/Sex: Treating RN: [birth year] (50 y.o. [name] Primary Care Cynara Tatham: Stoney Bang Other Clinician: Referring Erdine Hulen: Treating Aleina Burgio/Extender: Dallas Breeding in Treatment: 8 Active Problems Location of Pain Severity and Description of Pain Patient Has Paino Yes Site Locations Rate the pain. Current Pain Level: 9 Pain Management and Medication Current Pain Management: Electronic Signature(s) Signed: 07/01/2020 6:34:46 PM By: Baruch Gouty RN, BSN Signed: 07/02/2020 10:52:16 AM By: Sandre Kitty Entered By: Sandre Kitty on 07/01/2020 14:11:15 -------------------------------------------------------------------------------- Patient/Caregiver Education Details Patient Name: Date of Service: [name], [name] [facility] 6/15/2022andnbsp2:30 PM Medical Record Number: [id number] Patient Account Number: [id number] Date of Birth/Gender: Treating RN: 01-Jul-1970 (50 y.o. [name] Primary Care Physician: Stoney Bang Other Clinician: Referring Physician: Treating Physician/Extender: Dallas Breeding in Treatment: 8 Education Assessment Education Provided To: Patient Education Topics Provided Offloading: Methods: Explain/Verbal Responses: Reinforcements needed, State content correctly Wound/Skin Impairment: Methods: Explain/Verbal Responses: Reinforcements needed, State content correctly Electronic Signature(s) Signed: 07/01/2020 6:34:46 PM By: Baruch Gouty RN, BSN Entered By: Baruch Gouty on 07/01/2020 14:44:09 -------------------------------------------------------------------------------- Wound Assessment Details Patient Name: Date of Service: [name], [name] [facility] 07/01/2020 2:30 PM Medical Record Number: [id number] Patient Account Number: [id number] Date of Birth/Sex: Treating RN: 02-16-1970 (50 y.o. [name] Primary Care Jatavius Ellenwood: Stoney Bang Other Clinician: Referring Melayah Skorupski: Treating Hema Lanza/Extender: Dallas Breeding in Treatment: 8 Wound Status Wound Number: 3 Primary Diabetic Wound/Ulcer of the Lower Extremity Etiology: Wound Location: Right, Medial Foot Wound Open Wounding Event: Gradually Appeared Status: Date Acquired: 07/18/2019 Comorbid Sleep Apnea, Congestive Heart Failure, Hypertension, Type II Weeks Of Treatment: 8 History: Diabetes, Neuropathy Clustered Wound: No Photos Wound Measurements Length: (cm) 2.1 Width: (cm) 1.5 Depth: (cm) 0.5 Area: (cm) 2.474 Volume: (cm) 1.237 % Reduction in Area: 23.9% % Reduction in Volume: 52.4% Epithelialization: Small (1-33%) Tunneling: No Undermining: No Wound Description Classification: Grade 3 Wound Margin: Thickened Exudate Amount: Medium Exudate Type: Serosanguineous Exudate Color: red, brown Foul Odor After Cleansing: No Slough/Fibrino Yes Wound Bed Granulation Amount: Small (1-33%) Exposed Structure Granulation Quality: Red, Pink Fascia Exposed: No Necrotic Amount: Large (67-100%) Fat Layer (Subcutaneous Tissue) Exposed: Yes Necrotic Quality: Eschar, Adherent Slough Tendon Exposed: No Muscle Exposed: No Joint Exposed: No Bone Exposed: No Treatment Notes Wound #3 (Foot) Wound Laterality: Right, Medial Cleanser Peri-Wound Care Topical Triamcinolone Discharge Instruction:  Apply thin layer Triamcinolone to the wound bed Primary Dressing Hydrofera Blue Classic Foam, 4x4 in Discharge Instruction: Moisten  with saline prior to applying to wound,cut to fit inside wound edges Secondary Dressing Zetuvit Plus Silicone Border Dressing 5x5 (in/in) Discharge Instruction: Apply silicone border over primary dressing , or may use gauze and secure with rolled gauze Secured With Compression Wrap Compression Stockings Add-Ons Electronic Signature(s) Signed: 07/02/2020 5:21:02 PM By: Sandre Kitty Signed: 07/02/2020 5:31:32 PM By: Baruch Gouty RN, BSN Previous Signature: 07/01/2020 6:34:46 PM Version By: Baruch Gouty RN, BSN Entered By: Sandre Kitty on 07/02/2020 16:38:21 -------------------------------------------------------------------------------- [place] Details Patient Name: Date of Service: [name], [name] [facility] 07/01/2020 2:30 PM Medical Record Number: [id number] Patient Account Number: [id number] Date of Birth/Sex: Treating RN: 1970/02/14 (50 y.o. [name] Primary Care Nickalos Petersen: Stoney Bang Other Clinician: Referring Gracee Ratterree: Treating Le Ferraz/Extender: Dallas Breeding in Treatment: 8 Vital Signs Time Taken: 14:10 Temperature (F): 97.7 Height (in): 72 Pulse (bpm): 73 Weight (lbs): 304 Respiratory Rate (breaths/min): 17 Body Mass Index (BMI): 41.2 Blood Pressure (mmHg): 115/74 Reference Range: 80 - 120 mg / dl Electronic Signature(s) Signed: 07/02/2020 10:52:16 AM By: Sandre Kitty Entered By: Sandre Kitty on 07/01/2020 14:11:05

## 2020-07-03 DIAGNOSIS — B965 Pseudomonas (aeruginosa) (mallei) (pseudomallei) as the cause of diseases classified elsewhere: Secondary | ICD-10-CM | POA: Diagnosis not present

## 2020-07-03 DIAGNOSIS — E11621 Type 2 diabetes mellitus with foot ulcer: Secondary | ICD-10-CM | POA: Diagnosis not present

## 2020-07-03 DIAGNOSIS — B9562 Methicillin resistant Staphylococcus aureus infection as the cause of diseases classified elsewhere: Secondary | ICD-10-CM | POA: Diagnosis not present

## 2020-07-03 DIAGNOSIS — E1169 Type 2 diabetes mellitus with other specified complication: Secondary | ICD-10-CM | POA: Diagnosis not present

## 2020-07-03 DIAGNOSIS — M869 Osteomyelitis, unspecified: Secondary | ICD-10-CM | POA: Diagnosis not present

## 2020-07-03 DIAGNOSIS — L97412 Non-pressure chronic ulcer of right heel and midfoot with fat layer exposed: Secondary | ICD-10-CM | POA: Diagnosis not present

## 2020-07-06 DIAGNOSIS — L97412 Non-pressure chronic ulcer of right heel and midfoot with fat layer exposed: Secondary | ICD-10-CM | POA: Diagnosis not present

## 2020-07-06 DIAGNOSIS — E1169 Type 2 diabetes mellitus with other specified complication: Secondary | ICD-10-CM | POA: Diagnosis not present

## 2020-07-06 DIAGNOSIS — B9562 Methicillin resistant Staphylococcus aureus infection as the cause of diseases classified elsewhere: Secondary | ICD-10-CM | POA: Diagnosis not present

## 2020-07-06 DIAGNOSIS — B965 Pseudomonas (aeruginosa) (mallei) (pseudomallei) as the cause of diseases classified elsewhere: Secondary | ICD-10-CM | POA: Diagnosis not present

## 2020-07-06 DIAGNOSIS — E11621 Type 2 diabetes mellitus with foot ulcer: Secondary | ICD-10-CM | POA: Diagnosis not present

## 2020-07-06 DIAGNOSIS — M869 Osteomyelitis, unspecified: Secondary | ICD-10-CM | POA: Diagnosis not present

## 2020-07-08 ENCOUNTER — Encounter (HOSPITAL_BASED_OUTPATIENT_CLINIC_OR_DEPARTMENT_OTHER): Payer: Medicare Other | Admitting: Physician Assistant

## 2020-07-08 ENCOUNTER — Other Ambulatory Visit: Payer: Self-pay

## 2020-07-08 DIAGNOSIS — E11621 Type 2 diabetes mellitus with foot ulcer: Secondary | ICD-10-CM | POA: Diagnosis not present

## 2020-07-08 DIAGNOSIS — E1142 Type 2 diabetes mellitus with diabetic polyneuropathy: Secondary | ICD-10-CM | POA: Diagnosis not present

## 2020-07-08 DIAGNOSIS — Z794 Long term (current) use of insulin: Secondary | ICD-10-CM | POA: Diagnosis not present

## 2020-07-08 DIAGNOSIS — Z89411 Acquired absence of right great toe: Secondary | ICD-10-CM | POA: Diagnosis not present

## 2020-07-08 DIAGNOSIS — L97529 Non-pressure chronic ulcer of other part of left foot with unspecified severity: Secondary | ICD-10-CM | POA: Diagnosis not present

## 2020-07-08 DIAGNOSIS — I5042 Chronic combined systolic (congestive) and diastolic (congestive) heart failure: Secondary | ICD-10-CM | POA: Diagnosis not present

## 2020-07-08 DIAGNOSIS — L97512 Non-pressure chronic ulcer of other part of right foot with fat layer exposed: Secondary | ICD-10-CM | POA: Diagnosis not present

## 2020-07-08 DIAGNOSIS — F17218 Nicotine dependence, cigarettes, with other nicotine-induced disorders: Secondary | ICD-10-CM | POA: Diagnosis not present

## 2020-07-08 DIAGNOSIS — E1151 Type 2 diabetes mellitus with diabetic peripheral angiopathy without gangrene: Secondary | ICD-10-CM | POA: Diagnosis not present

## 2020-07-08 DIAGNOSIS — I110 Hypertensive heart disease with heart failure: Secondary | ICD-10-CM | POA: Diagnosis not present

## 2020-07-08 NOTE — Progress Notes (Addendum)
AMELIA, MACKEN ([id number]) Visit Report for 07/08/2020 Chief Complaint Document Details Patient Name: Date of Service: [name], [name] [facility] 07/08/2020 2:30 PM Medical Record Number: [id number] Patient Account Number: [id number] Date of Birth/Sex: Treating RN: [birth year] (50 y.o. [name] Primary Care Provider: [name] Other Clinician: Referring Provider: Treating Provider/Extender: [name] in Treatment: 9 Information Obtained from: Patient Chief Complaint Right foot ulcer Electronic Signature(s) Signed: 07/08/2020 2:47:59 PM By: Lenda Kelp PA-C Entered By: Lenda Kelp on 07/08/2020 14:47:59 -------------------------------------------------------------------------------- Debridement Details Patient Name: Date of Service: [name], [name] [facility] 07/08/2020 2:30 PM Medical Record Number: [id number] Patient Account Number: [id number] Date of Birth/Sex: Treating RN: [birth year] (50 y.o. [name] Primary Care Provider: [name] Other Clinician: Referring Provider: Treating Provider/Extender: [name] in Treatment: 9 Debridement Performed for Assessment: Wound #3 Right,Medial Foot Performed By: Physician Lenda Kelp, PA Debridement Type: Debridement Severity of Tissue Pre Debridement: Fat layer exposed Level of Consciousness (Pre-procedure): Awake and Alert Pre-procedure Verification/Time Out Yes - 15:05 Taken: Start Time: 15:07 Pain Control: Lidocaine 4% T opical Solution T Area Debrided (L x W): otal 1.5 (cm) x 1 (cm) = 1.5 (cm) Tissue and other material debrided: Viable, Non-Viable, Callus, Subcutaneous, Skin: Epidermis Level: Skin/Subcutaneous Tissue Debridement Description: Excisional Instrument: Curette Bleeding: Minimum Hemostasis Achieved: Pressure End Time: 15:11 Procedural Pain: 0 Post Procedural Pain: 0 Response to Treatment: Procedure was tolerated  well Level of Consciousness (Post- Awake and Alert procedure): Post Debridement Measurements of Total Wound Length: (cm) 0.5 Width: (cm) 0.3 Depth: (cm) 0.1 Volume: (cm) 0.012 Character of Wound/Ulcer Post Debridement: Improved Severity of Tissue Post Debridement: Fat layer exposed Post Procedure Diagnosis Same as Pre-procedure Electronic Signature(s) Signed: 07/08/2020 4:01:47 PM By: Lenda Kelp PA-C Signed: 07/08/2020 5:44:00 PM By: [name] RN, BSN Entered By: [name] on 07/08/2020 15:12:58 -------------------------------------------------------------------------------- HPI Details Patient Name: Date of Service: [name], [name] [facility] 07/08/2020 2:30 PM Medical Record Number: [id number] Patient Account Number: [id number] Date of Birth/Sex: Treating RN: 1970/02/14 (50 y.o. [name] Primary Care Provider: [name] Other Clinician: Referring Provider: Treating Provider/Extender: [name] in Treatment: 9 History of Present Illness HPI Description: ADMISSION 05/13/2019 This is a 50 year old man who has type 2 diabetes with peripheral neuropathy. He has a history of wounds on the plantar aspect of his left first and fourth toes. He has had these for several months. He was being seen in the wound care center at [facility] in [facility]. He apparently presented with swelling and an open wound at the tip of the toe. An MRI showed osteomyelitis. He was given 6 to 8 weeks of oral doxycycline again all of this via the patient we have none of these records. Since then he has been putting Goldbond on the areas wearing regular shoes. He was seen by his primary physician on 4/9 and sent down here for our review. In the primary care note it says he has been recommended for hyperbaric oxygen. Past medical history includes type 2 diabetes with peripheral neuropathy, heart failure with reduced ejection fraction 30 to 35%,  peripheral arterial disease, coronary artery disease, hyperlipidemia, hypertension, syncope and a prior history of a right great toe amputation also by Dr. Lynden Ang in [place] ABIs in our clinic were 0.91 on the left. Also notable that in 2019 he appears to have had arterial studies that are visible  in our system. At that point the right ABI was 1.17, TBI of 0.85 with triphasic waveforms. On the left his ABI was 1.19 with a TBI of 0.73 again with triphasic waveform 5/3; we did receive some information from [facility] wound care. The patient was seen there on 1/29. At that point he had wounds on the right foot at the amputation site the fifth digit fourth digit I am presuming on the right and then an area on the left first digit although that is not specifically stated, he has had a previous amputation on the right however. It would appear him at that time most of his ulcers were on the right the left foot is stated to have a lot of scales and calluses but not a lot of open wounds. The only wounds we define when he came in here last time were on the left first and left fourth toe He also had a wound culture that showed heavy growth of staph aureus and a slight growth of Stenotrophomonas maltophilia. The doxycycline should cover the staph aureus I am not sure about the stenotrophomonas. In any case he completed 6 weeks of this. UNFORTUNATELY I still do not have a copy of the MRI. And the exact justification for hyperbarics is therefore lacking. 5/10; the patient had osteomyelitis in the left fourth toe. He was treated for 6 weeks of doxycycline this wound is healed as is the left first toe. He was sent here for hyperbaric oxygen however at this point his wounds are healed and I think a course of watchful waiting and observation is in order. If the left fourth toe reopens then it is likely he will need a more protracted and aggressive treatment approach Readmission: 05/06/20 upon evaluation today patient  presents for readmission here in the clinic exam issues with his right foot on the medial aspect at the amputation site where his great toe was this is the first metatarsal area that is affected. He has been seeing Dr. Marcha Solders who performed the surgery. With that being said currently the patient was diagnosed as having MRSA on a culture that was + March 31. Has been on IV vancomycin for 6 weeks he is now on doxycycline as the MRSA was sensitive to Doxy and he does have 2 more refills he has been on that for 3 weeks already. He has MRI of January for showed L knee first metatarsal base early osteomyelitis versus possible reactive disease but nonetheless based on what was seen it appears osteomyelitis is more likely the cause here. He has had Apligraf although to be honest that did not seem to do the job. He is also been using Dakin's solution and currently is using Aquacel. Sharp debridement has been performed by Dr. Marcha Solders on a weekly basis according to what the patient tells me. With all that being said the patient was referred to [place] for further evaluation and treatment as unfortunately he does not seem to be responding well to his treatment with the surgeon. They have considered a total contact cast it sounds like but again with the infection that this was not a good idea. The patient does have a significant past medical history for diabetes mellitus type 2 for which she is on insulin, hypertension, significant congestive heart failure with a most recent ejection fraction of 30 to 35% and that was in 2020. He has not seen his cardiologist [name] since that time. In regard to his diabetes his A1c he does not  even know he tells me his blood sugars run somewhere in the 200-300 range. With that being said he has not seen the primary care provider recently for management of this either. The patient tells me he is also a current smoker. He tells me he is trying to stop using nicotine pouches but he just  does not have enough saliva he tells me his mouth is dry all the time this is probably a subsequent issue from the diabetes as well. Unfortunately he just appears to be in a place where he is not necessarily at his healthiest even with his medical conditions. I think he would be a candidate for hyperbaric oxygen therapy but there are a lot of other things that need to be in place first before we get to that point. 05/13/2020 patient presents for 1 week follow-up. He has been using Hydrofera Blue every other day without any issues. He had to reschedule his cardiologist appointment due to transportation Issues. He has no complaints today. 05/20/2020 upon evaluation today patient appears to be doing well with regard to his foot ulcer all things considered. With that being said he tells me that he has been tolerating the dressing changes without complication. There does not appear to be any signs of active infection which is great news and overall very pleased with where things stand today. No fevers, chills, nausea, vomiting, or diarrhea. 05/27/2020 upon evaluation today patient appears to be doing well with regard to his wound on the foot. He does not require little bit of debridement today but overall he seems to be doing quite well. He actually has his echo on Monday we will see what that shows as well were hoping to be able to get him into the hyperbaric oxygen chamber but again a lot depends on what this test shows he will also need a chest x-ray if this turns out okay before going in the chamber. 06/03/2020 upon evaluation today patient's wound actually showing signs of minimal improvement. Fortunately I think that he is continuing to make great progress all things considered. He does have his echocardiogram next week. That is good news so we can see whether or not hyperbaric oxygen therapy would be appropriate for him I am hopeful we will be able to get him in the chamber sooner rather than later but  again it all depends on his ejection fraction. Right now we have been somewhat limited in being able to proceed with that as to be perfectly honest we just have not gotten the needed testing in order to go forward with the hyperbarics. If his ejection fraction is too low which could be the case then obviously is not good to be able to 5/25; patient presents for 1 week follow-up. He states he has his echo set up for next week. He overall feels well. He denies any issues over the past week. He denies signs of infection. 06/17/2020 upon evaluation today patient appears to be doing about the same in regard to his foot ulcer is measuring a little bit smaller today but still keeps developing the spongy tissue. I really think it may be a good idea for [place] to obtain a sample of a punch biopsy to send for evaluation. He is in agreement with this plan. Subsequently he did have his echocardiogram which showed that he has a systolic left ventricular function which is moderately decreased estimated to be 30 to 35%. He does have a follow-up appointment with the cardiologist on Monday to  see what they think about this. I advised that he needs to talk to them as well that what they feel about him potentially going into hyperbaric oxygen therapy if his heart would be sufficient for this or not. Obviously if they feel that it is something that would be sufficient then we need to have this in writing. Otherwise patient tells me that he has been doing fairly well is not really having any significant pain. 06/24/2020 upon evaluation today patient appears to be doing decently well in regard to his wound. In fact he does not have as much of the tissue that grew back this time. I do see that he had the results back from his skin biopsy. This showed reactive squamous hyperplasia. This again seems to be somewhat similar to lichen planus and how that would function. With that being said there was no malignancy and potentially this  means it could be a result and a response to like a topical steroid like triamcinolone. I believe this may at least be something to get a shot at this point. The patient is in agreement with the plan. I am very pleased though there is no malignancy noted. We also got a letter back from his cardiologist stating that he is from a cardiac perspective able to participate in hyperbaric oxygen therapy. Therefore if need be I think we can proceed as such. For that reason I am going to send in for the chest x-ray in order to ensure that we have what we need in place if we do need to proceed down that road. 07/01/2020 upon evaluation today patient appears to be doing decently well in regard to his wound. He has been tolerating the dressing changes without complication. Fortunately there does not appear to be any signs of active infection which is great news. No fever chills noted. He does note that he had a little bit of increased pain over the past week this seems to be kind of a neuropathy type issue to be honest. It does seem to be coming from his wound and I do not think it is coming from anywhere else in his foot although he does note some referred pain that seems to be traveling a little bit on him. Overall I think that in general this is just more of a bit of a mind game in that regard not that he is not hurting but rather where he is experiencing the pain at. Either way I do believe that the wound is doing much better as compared to what it was previous. 07/08/2020 upon evaluation today patient's wound actually appears to be doing excellent. He is making great progress and the triamcinolone has done excellent for him. There does not appear to be any signs of active infection which is great news. No fevers, chills, nausea, vomiting, or diarrhea. Electronic Signature(s) Signed: 07/08/2020 3:32:42 PM By: Lenda Kelp PA-C Entered By: Lenda Kelp on 07/08/2020  15:32:42 -------------------------------------------------------------------------------- Physical Exam Details Patient Name: Date of Service: [name], [name] [facility] 07/08/2020 2:30 PM Medical Record Number: [id number] Patient Account Number: [id number] Date of Birth/Sex: Treating RN: 08-12-1970 (50 y.o. [name] Primary Care Provider: [name] Other Clinician: Referring Provider: Treating Provider/Extender: [name] in Treatment: 9 Constitutional Well-nourished and well-hydrated in no acute distress. Respiratory normal breathing without difficulty. Psychiatric this patient is able to make decisions and demonstrates good insight into disease process. Alert and Oriented x 3. pleasant and cooperative.  Notes Patient's wound bed did require some sharp debridement today to clear away some of the necrotic debris he tolerated that today without complication postdebridement wound bed appears to be doing significantly better which is great news. Overall he has just a very small area remaining I do think hyperbaric oxygen therapy is even can be necessary. Electronic Signature(s) Signed: 07/08/2020 3:33:08 PM By: Lenda Kelp PA-C Entered By: Lenda Kelp on 07/08/2020 15:33:08 -------------------------------------------------------------------------------- Physician Orders Details Patient Name: Date of Service: [name], [name] [facility] 07/08/2020 2:30 PM Medical Record Number: [id number] Patient Account Number: [id number] Date of Birth/Sex: Treating RN: May 12, 1970 (50 y.o. [name] Primary Care Provider: [name] Other Clinician: Referring Provider: Treating Provider/Extender: [name] in Treatment: 9 Verbal / Phone Orders: No Diagnosis Coding ICD-10 Coding Code Description E11.621 Type 2 diabetes mellitus with foot ulcer L97.512 Non-pressure chronic ulcer of other part of right foot with fat  layer exposed E11.42 Type 2 diabetes mellitus with diabetic polyneuropathy I10 Essential (primary) hypertension I50.42 Chronic combined systolic (congestive) and diastolic (congestive) heart failure F17.218 Nicotine dependence, cigarettes, with other nicotine-induced disorders Follow-up Appointments ppointment in 1 week. - with Leonard Schwartz Return A Bathing/ Shower/ Hygiene May shower with protection but do not get wound dressing(s) wet. Off-Loading Wedge shoe to: - right foot to ambulate Additional Orders / Instructions Stop/Decrease Smoking Follow Nutritious Diet Home Health No change in wound care orders this week; continue Home Health for wound care. May utilize formulary equivalent dressing for wound treatment orders unless otherwise specified. Other Home Health Orders/Instructions: - [facility]ound [facility] Wound #3 - Foot Wound Laterality: Right, Medial Topical: Triamcinolone 1 x Per Day/30 Days Discharge Instructions: Apply thin layer Triamcinolone to the wound bed Prim Dressing: Hydrofera Blue Classic Foam, 4x4 in 1 x Per Day/30 Days ary Discharge Instructions: Moisten with saline prior to applying to wound,cut to fit inside wound edges Secondary Dressing: Zetuvit Plus Silicone Border Dressing 5x5 (in/in) 1 x Per Day/30 Days Discharge Instructions: Apply silicone border over primary dressing , or may use gauze and secure with rolled gauze Electronic Signature(s) Signed: 07/08/2020 4:01:47 PM By: Lenda Kelp PA-C Signed: 07/08/2020 5:44:00 PM By: [name] RN, BSN Entered By: [name] on 07/08/2020 15:14:38 -------------------------------------------------------------------------------- Problem List Details Patient Name: Date of Service: [name], [name] [facility] 07/08/2020 2:30 PM Medical Record Number: [id number] Patient Account Number: [id number] Date of Birth/Sex: Treating RN: 1970/11/19 (50 y.o. Bayard Hugger, Bonita Quin Primary Care Provider: [name] Other  Clinician: Referring Provider: Treating Provider/Extender: [name] in Treatment: 9 Active Problems ICD-10 Encounter Code Description Active Date MDM Diagnosis E11.621 Type 2 diabetes mellitus with foot ulcer 05/06/2020 No Yes L97.512 Non-pressure chronic ulcer of other part of right foot with fat layer exposed 05/06/2020 No Yes E11.42 Type 2 diabetes mellitus with diabetic polyneuropathy 05/06/2020 No Yes I10 Essential (primary) hypertension 05/06/2020 No Yes I50.42 Chronic combined systolic (congestive) and diastolic (congestive) heart failure 05/06/2020 No Yes F17.218 Nicotine dependence, cigarettes, with other nicotine-induced disorders 05/06/2020 No Yes Inactive Problems Resolved Problems Electronic Signature(s) Signed: 07/08/2020 2:47:48 PM By: Lenda Kelp PA-C Entered By: Lenda Kelp on 07/08/2020 14:47:47 -------------------------------------------------------------------------------- Progress Note Details Patient Name: Date of Service: [name], [name] [facility] 07/08/2020 2:30 PM Medical Record Number: [id number] Patient Account Number: [id number] Date of Birth/Sex: Treating RN: [birth year] (50 y.o. [name] Primary Care Provider: [name] Other Clinician: Referring Provider: Treating Provider/Extender: Linwood Dibbles,  Vivi Martens Weeks in Treatment: 9 Subjective Chief Complaint Information obtained from Patient Right foot ulcer History of Present Illness (HPI) ADMISSION 05/13/2019 This is a 50 year old man who has type 2 diabetes with peripheral neuropathy. He has a history of wounds on the plantar aspect of his left first and fourth toes. He has had these for several months. He was being seen in the wound care center at [facility] in [facility]. He apparently presented with swelling and an open wound at the tip of the toe. An MRI showed osteomyelitis. He was given 6 to 8 weeks of oral doxycycline again all  of this via the patient we have none of these records. Since then he has been putting Goldbond on the areas wearing regular shoes. He was seen by his primary physician on 4/9 and sent down here for our review. In the primary care note it says he has been recommended for hyperbaric oxygen. Past medical history includes type 2 diabetes with peripheral neuropathy, heart failure with reduced ejection fraction 30 to 35%, peripheral arterial disease, coronary artery disease, hyperlipidemia, hypertension, syncope and a prior history of a right great toe amputation also by Dr. Lynden Ang in [place] ABIs in our clinic were 0.91 on the left. Also notable that in 2019 he appears to have had arterial studies that are visible in our system. At that point the right ABI was 1.17, TBI of 0.85 with triphasic waveforms. On the left his ABI was 1.19 with a TBI of 0.73 again with triphasic waveform 5/3; we did receive some information from [facility] wound care. The patient was seen there on 1/29. At that point he had wounds on the right foot at the amputation site the fifth digit fourth digit I am presuming on the right and then an area on the left first digit although that is not specifically stated, he has had a previous amputation on the right however. It would appear him at that time most of his ulcers were on the right the left foot is stated to have a lot of scales and calluses but not a lot of open wounds. The only wounds we define when he came in here last time were on the left first and left fourth toe He also had a wound culture that showed heavy growth of staph aureus and a slight growth of Stenotrophomonas maltophilia. The doxycycline should cover the staph aureus I am not sure about the stenotrophomonas. In any case he completed 6 weeks of this. UNFORTUNATELY I still do not have a copy of the MRI. And the exact justification for hyperbarics is therefore lacking. 5/10; the patient had osteomyelitis in the left  fourth toe. He was treated for 6 weeks of doxycycline this wound is healed as is the left first toe. He was sent here for hyperbaric oxygen however at this point his wounds are healed and I think a course of watchful waiting and observation is in order. If the left fourth toe reopens then it is likely he will need a more protracted and aggressive treatment approach Readmission: 05/06/20 upon evaluation today patient presents for readmission here in the clinic exam issues with his right foot on the medial aspect at the amputation site where his great toe was this is the first metatarsal area that is affected. He has been seeing Dr. Marcha Solders who performed the surgery. With that being said currently the patient was diagnosed as having MRSA on a culture that was + March 31. Has  been on IV vancomycin for 6 weeks he is now on doxycycline as the MRSA was sensitive to Doxy and he does have 2 more refills he has been on that for 3 weeks already. He has MRI of January for showed L knee first metatarsal base early osteomyelitis versus possible reactive disease but nonetheless based on what was seen it appears osteomyelitis is more likely the cause here. He has had Apligraf although to be honest that did not seem to do the job. He is also been using Dakin's solution and currently is using Aquacel. Sharp debridement has been performed by Dr. Marcha Solders on a weekly basis according to what the patient tells me. With all that being said the patient was referred to [place] for further evaluation and treatment as unfortunately he does not seem to be responding well to his treatment with the surgeon. They have considered a total contact cast it sounds like but again with the infection that this was not a good idea. The patient does have a significant past medical history for diabetes mellitus type 2 for which she is on insulin, hypertension, significant congestive heart failure with a most recent ejection fraction of 30 to 35% and  that was in 2020. He has not seen his cardiologist [name] since that time. In regard to his diabetes his A1c he does not even know he tells me his blood sugars run somewhere in the 200-300 range. With that being said he has not seen the primary care provider recently for management of this either. The patient tells me he is also a current smoker. He tells me he is trying to stop using nicotine pouches but he just does not have enough saliva he tells me his mouth is dry all the time this is probably a subsequent issue from the diabetes as well. Unfortunately he just appears to be in a place where he is not necessarily at his healthiest even with his medical conditions. I think he would be a candidate for hyperbaric oxygen therapy but there are a lot of other things that need to be in place first before we get to that point. 05/13/2020 patient presents for 1 week follow-up. He has been using Hydrofera Blue every other day without any issues. He had to reschedule his cardiologist appointment due to transportation Issues. He has no complaints today. 05/20/2020 upon evaluation today patient appears to be doing well with regard to his foot ulcer all things considered. With that being said he tells me that he has been tolerating the dressing changes without complication. There does not appear to be any signs of active infection which is great news and overall very pleased with where things stand today. No fevers, chills, nausea, vomiting, or diarrhea. 05/27/2020 upon evaluation today patient appears to be doing well with regard to his wound on the foot. He does not require little bit of debridement today but overall he seems to be doing quite well. He actually has his echo on Monday we will see what that shows as well were hoping to be able to get him into the hyperbaric oxygen chamber but again a lot depends on what this test shows he will also need a chest x-ray if this turns out okay before going in the  chamber. 06/03/2020 upon evaluation today patient's wound actually showing signs of minimal improvement. Fortunately I think that he is continuing to make great progress all things considered. He does have his echocardiogram next week. That is good news so  we can see whether or not hyperbaric oxygen therapy would be appropriate for him I am hopeful we will be able to get him in the chamber sooner rather than later but again it all depends on his ejection fraction. Right now we have been somewhat limited in being able to proceed with that as to be perfectly honest we just have not gotten the needed testing in order to go forward with the hyperbarics. If his ejection fraction is too low which could be the case then obviously is not good to be able to 5/25; patient presents for 1 week follow-up. He states he has his echo set up for next week. He overall feels well. He denies any issues over the past week. He denies signs of infection. 06/17/2020 upon evaluation today patient appears to be doing about the same in regard to his foot ulcer is measuring a little bit smaller today but still keeps developing the spongy tissue. I really think it may be a good idea for [place] to obtain a sample of a punch biopsy to send for evaluation. He is in agreement with this plan. Subsequently he did have his echocardiogram which showed that he has a systolic left ventricular function which is moderately decreased estimated to be 30 to 35%. He does have a follow-up appointment with the cardiologist on Monday to see what they think about this. I advised that he needs to talk to them as well that what they feel about him potentially going into hyperbaric oxygen therapy if his heart would be sufficient for this or not. Obviously if they feel that it is something that would be sufficient then we need to have this in writing. Otherwise patient tells me that he has been doing fairly well is not really having any significant  pain. 06/24/2020 upon evaluation today patient appears to be doing decently well in regard to his wound. In fact he does not have as much of the tissue that grew back this time. I do see that he had the results back from his skin biopsy. This showed reactive squamous hyperplasia. This again seems to be somewhat similar to lichen planus and how that would function. With that being said there was no malignancy and potentially this means it could be a result and a response to like a topical steroid like triamcinolone. I believe this may at least be something to get a shot at this point. The patient is in agreement with the plan. I am very pleased though there is no malignancy noted. We also got a letter back from his cardiologist stating that he is from a cardiac perspective able to participate in hyperbaric oxygen therapy. Therefore if need be I think we can proceed as such. For that reason I am going to send in for the chest x-ray in order to ensure that we have what we need in place if we do need to proceed down that road. 07/01/2020 upon evaluation today patient appears to be doing decently well in regard to his wound. He has been tolerating the dressing changes without complication. Fortunately there does not appear to be any signs of active infection which is great news. No fever chills noted. He does note that he had a little bit of increased pain over the past week this seems to be kind of a neuropathy type issue to be honest. It does seem to be coming from his wound and I do not think it is coming from anywhere else in his foot  although he does note some referred pain that seems to be traveling a little bit on him. Overall I think that in general this is just more of a bit of a mind game in that regard not that he is not hurting but rather where he is experiencing the pain at. Either way I do believe that the wound is doing much better as compared to what it was previous. 07/08/2020 upon evaluation  today patient's wound actually appears to be doing excellent. He is making great progress and the triamcinolone has done excellent for him. There does not appear to be any signs of active infection which is great news. No fevers, chills, nausea, vomiting, or diarrhea. Objective Constitutional Well-nourished and well-hydrated in no acute distress. Vitals Time Taken: 2:36 PM, Height: 72 in, Weight: 304 lbs, BMI: 41.2, Temperature: 98.5 F, Pulse: 77 bpm, Respiratory Rate: 18 breaths/min, Blood Pressure: 117/77 mmHg. Respiratory normal breathing without difficulty. Psychiatric this patient is able to make decisions and demonstrates good insight into disease process. Alert and Oriented x 3. pleasant and cooperative. General Notes: Patient's wound bed did require some sharp debridement today to clear away some of the necrotic debris he tolerated that today without complication postdebridement wound bed appears to be doing significantly better which is great news. Overall he has just a very small area remaining I do think hyperbaric oxygen therapy is even can be necessary. Integumentary (Hair, Skin) Wound #3 status is Open. Original cause of wound was Gradually Appeared. The date acquired was: 07/18/2019. The wound has been in treatment 9 weeks. The wound is located on the Right,Medial Foot. The wound measures 0.8cm length x 0.3cm width x 0.4cm depth; 0.188cm^2 area and 0.075cm^3 volume. There is Fat Layer (Subcutaneous Tissue) exposed. There is no tunneling or undermining noted. There is a medium amount of serosanguineous drainage noted. The wound margin is thickened. There is medium (34-66%) red, pink granulation within the wound bed. There is a medium (34-66%) amount of necrotic tissue within the wound bed including Adherent Slough. General Notes: callous to periwound. Assessment Active Problems ICD-10 Type 2 diabetes mellitus with foot ulcer Non-pressure chronic ulcer of other part of right  foot with fat layer exposed Type 2 diabetes mellitus with diabetic polyneuropathy Essential (primary) hypertension Chronic combined systolic (congestive) and diastolic (congestive) heart failure Nicotine dependence, cigarettes, with other nicotine-induced disorders Procedures Wound #3 Pre-procedure diagnosis of Wound #3 is a Diabetic Wound/Ulcer of the Lower Extremity located on the Right,Medial Foot .Severity of Tissue Pre Debridement is: Fat layer exposed. There was a Excisional Skin/Subcutaneous Tissue Debridement with a total area of 1.5 sq cm performed by Lenda Kelp, PA. With the following instrument(s): Curette to remove Viable and Non-Viable tissue/material. Material removed includes Callus, Subcutaneous Tissue, and Skin: Epidermis after achieving pain control using Lidocaine 4% Topical Solution. No specimens were taken. A time out was conducted at 15:05, prior to the start of the procedure. A Minimum amount of bleeding was controlled with Pressure. The procedure was tolerated well with a pain level of 0 throughout and a pain level of 0 following the procedure. Post Debridement Measurements: 0.5cm length x 0.3cm width x 0.1cm depth; 0.012cm^3 volume. Character of Wound/Ulcer Post Debridement is improved. Severity of Tissue Post Debridement is: Fat layer exposed. Post procedure Diagnosis Wound #3: Same as Pre-Procedure Plan Follow-up Appointments: Return Appointment in 1 week. - with Luana Shu Shower/ Hygiene: May shower with protection but do not get wound dressing(s) wet. Off-Loading: Wedge shoe to: - right  foot to ambulate Additional Orders / Instructions: Stop/Decrease Smoking Follow Nutritious Diet Home Health: No change in wound care orders this week; continue Home Health for wound care. May utilize formulary equivalent dressing for wound treatment orders unless otherwise specified. Other Home Health Orders/Instructions: - [facility] WOUND #3: - Foot Wound Laterality:  Right, Medial Topical: Triamcinolone 1 x Per Day/30 Days Discharge Instructions: Apply thin layer Triamcinolone to the wound bed Prim Dressing: Hydrofera Blue Classic Foam, 4x4 in 1 x Per Day/30 Days ary Discharge Instructions: Moisten with saline prior to applying to wound,cut to fit inside wound edges Secondary Dressing: Zetuvit Plus Silicone Border Dressing 5x5 (in/in) 1 x Per Day/30 Days Discharge Instructions: Apply silicone border over primary dressing , or may use gauze and secure with rolled gauze 1. I am going to suggest that we going continue with the wound care measures as before using the triamcinolone followed by the [facility] Incydrofera Blue which I think is still doing a great job. 2. Also can recommend the patient continue to monitor for any signs of worsening or infection if anything occurs he should let me know soon as possible. We will see patient back for reevaluation in 1 week here in the clinic. If anything worsens or changes patient will contact our office for additional recommendations. Electronic Signature(s) Signed: 07/08/2020 3:33:44 PM By: Lenda KelpStone [name], Kendon Sedeno PA-C Entered By: Lenda KelpStone [name], Matalie Romberger on 07/08/2020 15:33:44 -------------------------------------------------------------------------------- SuperBill Details Patient Name: Date of Service: Jose CrawEYNO LDS [name], [name]2020 Medical Record Number: [id number] Patient Account Number: [id number] Date of Birth/Sex: Treating RN: 1970-08-29 (50 y.o. Jose SchoonerM) Boehlein, Linda Primary Care Provider: Lia HoppingHasanaj, Xaje Other Clinician: Referring Provider: Treating Provider/Extender: Laurann MontanaStone [name], Dareon Nunziato Hasanaj, Xaje Weeks in Treatment: 9 Diagnosis Coding ICD-10 Codes Code Description E11.621 Type 2 diabetes mellitus with foot ulcer L97.512 Non-pressure chronic ulcer of other part of right foot with fat layer exposed E11.42 Type 2 diabetes mellitus with diabetic polyneuropathy I10 Essential (primary) hypertension I50.42 Chronic combined systolic  (congestive) and diastolic (congestive) heart failure F17.218 Nicotine dependence, cigarettes, with other nicotine-induced disorders Facility Procedures CPT4 Code: [id number] Description: 11042 - DEB SUBQ TISSUE 20 SQ CM/< ICD-10 Diagnosis Description L97.512 Non-pressure chronic ulcer of other part of right foot with fat layer exposed Modifier: Quantity: 1 Physician Procedures : CPT4 Code Description Modifier [id number] 11042 - WC PHYS SUBQ TISS 20 SQ CM ICD-10 Diagnosis Description L97.512 Non-pressure chronic ulcer of other part of right foot with fat layer exposed Quantity: 1 Electronic Signature(s) Signed: 07/08/2020 3:33:52 PM By: Lenda KelpStone [name], Danetta Prom PA-C Entered By: Lenda KelpStone [name], Loisann Roach on 07/08/2020 15:33:52

## 2020-07-08 NOTE — Progress Notes (Signed)
[name], [name] ([id number]) Visit Report for 07/08/2020 Arrival Information Details Patient Name: Date of Service: [name] NDO [facility] 07/08/2020 2:30 PM Medical Record Number: [id number] Patient Account Number: [id number] Date of Birth/Sex: Treating RN: Jun 20, 1970 (50 y.o. [name] Primary Care [name]: [name] Other Clinician: Referring [name]: Treating [name]/Extender: [name] in Treatment: 9 Visit Information History Since Last Visit All ordered tests and consults were completed: No Patient Arrived: [name] Added or deleted any medications: No Arrival Time: 14:36 Any new allergies or adverse reactions: No Accompanied By: self Had a fall or experienced change in No Transfer Assistance: None activities of daily living that may affect Patient Identification Verified: Yes risk of falls: Secondary Verification Process Completed: Yes Signs or symptoms of abuse/neglect since last visito No Patient Requires Transmission-Based Precautions: No Hospitalized since last visit: No Patient Has Alerts: No Implantable device outside of the clinic excluding No cellular tissue based products placed in the center since last visit: Has Dressing in Place as Prescribed: Yes Has Footwear/Offloading in Place as Prescribed: Yes Right: Wedge Shoe Pain Present Now: No Notes Per patient the new wedge shoe has helped- no pain since ambulating in new shoe. Per patient scheduled transportation to [facility] in [place], [place] for chest x-ray and blood work tomorrow. Electronic Signature(s) Signed: 07/08/2020 5:52:09 PM By: Deon Pilling Entered By: Deon Pilling on 07/08/2020 14:46:51 -------------------------------------------------------------------------------- Encounter Discharge Information Details Patient Name: Date of Service: [name], RA NDO [facility] 07/08/2020 2:30 PM Medical Record Number: [id number] Patient Account Number: [id number] Date  of Birth/Sex: Treating RN: Aug 14, 1970 (50 y.o. [name] Primary Care Imoni Kohen: [name] Other Clinician: Referring [name]: Treating [name]/Extender: [name] in Treatment: 9 Encounter Discharge Information Items Post Procedure Vitals Discharge Condition: Stable Temperature (F): 98.5 Ambulatory Status: Cane Pulse (bpm): 77 Discharge Destination: Home Respiratory Rate (breaths/min): 18 Transportation: Private Auto Blood Pressure (mmHg): 117/77 Accompanied By: self Schedule Follow-up Appointment: Yes Clinical Summary of Care: Electronic Signature(s) Signed: 07/08/2020 5:52:09 PM By: Deon Pilling Entered By: Deon Pilling on 07/08/2020 17:51:25 -------------------------------------------------------------------------------- Lower Extremity Assessment Details Patient Name: Date of Service: [name] RA NDO [facility] 07/08/2020 2:30 PM Medical Record Number: [id number] Patient Account Number: [id number] Date of Birth/Sex: Treating RN: 09-01-1970 (50 y.o. [name] Primary Care Karolina Zamor: [name] Other Clinician: Referring Mehar Kirkwood: Treating Yashar Inclan/Extender: [name] in Treatment: 9 Edema Assessment Assessed: Shirlyn Goltz: No] [name]: Yes] Edema: [Left: N] [Right: o] Calf Left: Right: Point of Measurement: 34 cm From Medial Instep 41 cm Ankle Left: Right: Point of Measurement: 13 cm From Medial Instep 25 cm Vascular Assessment Pulses: Dorsalis Pedis Palpable: [Right:Yes] Electronic Signature(s) Signed: 07/08/2020 5:52:09 PM By: Deon Pilling Entered By: Deon Pilling on 07/08/2020 14:47:50 -------------------------------------------------------------------------------- Multi-Disciplinary Care Plan Details Patient Name: Date of Service: [name], RA NDO [facility] 07/08/2020 2:30 PM Medical Record Number: [id number] Patient Account Number: [id number] Date of Birth/Sex: Treating RN: 1970/08/30 (50  y.o. [name] Primary Care Taras Rask: [name] Other Clinician: Referring Dangela How: Treating Caiden Arteaga/Extender: [name] in Treatment: [place] reviewed with physician Active Inactive Nutrition Nursing Diagnoses: Impaired glucose control: actual or potential Potential for alteratiion in Nutrition/Potential for imbalanced nutrition Goals: Patient/caregiver will maintain therapeutic glucose control Date Initiated: 05/13/2020 Target Resolution Date: 08/05/2020 Goal Status: Active Interventions: Assess patient nutrition upon admission and as needed per policy Treatment Activities: Patient referred to Primary Care Physician  for further nutritional evaluation : 05/13/2020 Notes: Osteomyelitis Nursing Diagnoses: Infection: osteomyelitis Knowledge deficit related to disease process and management Goals: Patient's osteomyelitis will resolve Date Initiated: 05/13/2020 Target Resolution Date: 08/05/2020 Goal Status: Active Interventions: Assess for signs and symptoms of osteomyelitis resolution every visit Provide education on osteomyelitis Treatment Activities: Systemic antibiotics : 05/13/2020 T ordered outside of clinic : 05/13/2020 est Notes: Wound/Skin Impairment Nursing Diagnoses: Impaired tissue integrity Knowledge deficit related to ulceration/compromised skin integrity Goals: Patient/caregiver will verbalize understanding of skin care regimen Date Initiated: 05/13/2020 Target Resolution Date: 08/05/2020 Goal Status: Active Ulcer/skin breakdown will have a volume reduction of 30% by week 4 Date Initiated: 05/13/2020 Date Inactivated: 06/10/2020 Target Resolution Date: 06/03/2020 Goal Status: Met Ulcer/skin breakdown will have a volume reduction of 50% by week 8 Date Initiated: 06/10/2020 Date Inactivated: 07/08/2020 Target Resolution Date: 07/08/2020 Goal Status: Met Ulcer/skin breakdown will have a volume  reduction of 80% by week 12 Date Initiated: 07/08/2020 Target Resolution Date: 08/05/2020 Goal Status: Active Interventions: Assess patient/caregiver ability to obtain necessary supplies Assess patient/caregiver ability to perform ulcer/skin care regimen upon admission and as needed Assess ulceration(s) every visit Provide education on ulcer and skin care Treatment Activities: Skin care regimen initiated : 05/13/2020 Topical wound management initiated : 05/13/2020 Notes: Electronic Signature(s) Signed: 07/08/2020 5:44:00 PM By: Baruch Gouty RN, BSN Entered By: Baruch Gouty on 07/08/2020 15:07:51 -------------------------------------------------------------------------------- Pain Assessment Details Patient Name: Date of Service: [name], RA NDO [facility] 07/08/2020 2:30 PM Medical Record Number: [id number] Patient Account Number: [id number] Date of Birth/Sex: Treating RN: [birth year] (50 y.o. [name] Primary Care Mattalynn Crandle: [name] Other Clinician: Referring Miamarie Moll: Treating Auburn Hester/Extender: [name] in Treatment: 9 Active Problems Location of Pain Severity and Description of Pain Patient Has Paino No Site Locations Rate the pain. Current Pain Level: 0 Pain Management and Medication Current Pain Management: Medication: No Cold Application: No Rest: No Massage: No Activity: No T.E.N.S.: No Heat Application: No Leg drop or elevation: No Is the Current Pain Management Adequate: Adequate How does your wound impact your activities of daily livingo Sleep: No Bathing: No Appetite: No Relationship With Others: No Bladder Continence: No Emotions: No Bowel Continence: No Work: No Toileting: No Drive: No Dressing: No Hobbies: No Electronic Signature(s) Signed: 07/08/2020 5:52:09 PM By: Deon Pilling Entered By: Deon Pilling on 07/08/2020  14:47:40 -------------------------------------------------------------------------------- Patient/Caregiver Education Details Patient Name: Date of Service: [name], [name] 6/22/2022andnbsp2:30 PM Medical Record Number: [id number] Patient Account Number: [id number] Date of Birth/Gender: Treating RN: 1971/01/13 (50 y.o. [name] Primary Care Physician: [name] Other Clinician: Referring Physician: Treating Physician/Extender: [name] in Treatment: 9 Education Assessment Education Provided To: Patient Education Topics Provided Offloading: Methods: Explain/Verbal Responses: Reinforcements needed, State content correctly Wound/Skin Impairment: Methods: Explain/Verbal Responses: Reinforcements needed, State content correctly Electronic Signature(s) Signed: 07/08/2020 5:44:00 PM By: Baruch Gouty RN, BSN Entered By: Baruch Gouty on 07/08/2020 15:09:14 -------------------------------------------------------------------------------- Wound Assessment Details Patient Name: Date of Service: [name], RA NDO [facility] 07/08/2020 2:30 PM Medical Record Number: [id number] Patient Account Number: [id number] Date of Birth/Sex: Treating RN: Apr 14, 1970 (50 y.o. [name] Primary Care Dajour Pierpoint: [name] Other Clinician: Referring Capri Raben: Treating Jevin Camino/Extender: Matt Holmes Weeks in Treatment: 9 Wound Status Wound Number: 3 Primary Diabetic Wound/Ulcer of the Lower Extremity Etiology: Wound Location: Right, Medial Foot Wound Open Wounding Event: Gradually Appeared Status: Date Acquired: 07/18/2019 Comorbid Sleep Apnea, Congestive Heart Failure, Hypertension,  Type II Weeks Of Treatment: 9 History: Diabetes, Neuropathy Clustered Wound: No Photos Wound Measurements Length: (cm) 0.8 Width: (cm) 0.3 Depth: (cm) 0.4 Area: (cm) 0.188 Volume: (cm) 0.075 % Reduction in Area: 94.2% % Reduction in  Volume: 97.1% Epithelialization: Large (67-100%) Tunneling: No Undermining: No Wound Description Classification: Grade 3 Wound Margin: Thickened Exudate Amount: Medium Exudate Type: Serosanguineous Exudate Color: red, brown Foul Odor After Cleansing: No Slough/Fibrino Yes Wound Bed Granulation Amount: Medium (34-66%) Exposed Structure Granulation Quality: Red, Pink Fascia Exposed: No Necrotic Amount: Medium (34-66%) Fat Layer (Subcutaneous Tissue) Exposed: Yes Necrotic Quality: Adherent Slough Tendon Exposed: No Muscle Exposed: No Joint Exposed: No Bone Exposed: No Assessment Notes callous to periwound. Treatment Notes Wound #3 (Foot) Wound Laterality: Right, Medial Cleanser Peri-Wound Care Topical Triamcinolone Discharge Instruction: Apply thin layer Triamcinolone to the wound bed Primary Dressing Hydrofera Blue Classic Foam, 4x4 in Discharge Instruction: Moisten with saline prior to applying to wound,cut to fit inside wound edges Secondary Dressing Zetuvit Plus Silicone Border Dressing 5x5 (in/in) Discharge Instruction: Apply silicone border over primary dressing , or may use gauze and secure with rolled gauze Secured With Compression Wrap Compression Stockings Add-Ons Electronic Signature(s) Signed: 07/08/2020 4:31:55 PM By: Sandre Kitty Signed: 07/08/2020 5:52:09 PM By: Deon Pilling Entered By: Sandre Kitty on 07/08/2020 16:19:51 -------------------------------------------------------------------------------- Vitals Details Patient Name: Date of Service: [name], RA NDO [facility] 07/08/2020 2:30 PM Medical Record Number: [id number] Patient Account Number: [id number] Date of Birth/Sex: Treating RN: [birth year] (50 y.o. [name] Primary Care Elleah Hemsley: [name] Other Clinician: Referring Charley Miske: Treating Shondell Poulson/Extender: [name] in Treatment: 9 Vital Signs Time Taken: 14:36 Temperature (F): 98.5 Height  (in): 72 Pulse (bpm): 77 Weight (lbs): 304 Respiratory Rate (breaths/min): 18 Body Mass Index (BMI): 41.2 Blood Pressure (mmHg): 117/77 Reference Range: 80 - 120 mg / dl Electronic Signature(s) Signed: 07/08/2020 5:52:09 PM By: Deon Pilling Entered By: Deon Pilling on 07/08/2020 14:47:17

## 2020-07-09 DIAGNOSIS — R911 Solitary pulmonary nodule: Secondary | ICD-10-CM | POA: Diagnosis not present

## 2020-07-09 DIAGNOSIS — M869 Osteomyelitis, unspecified: Secondary | ICD-10-CM | POA: Diagnosis not present

## 2020-07-09 DIAGNOSIS — I5022 Chronic systolic (congestive) heart failure: Secondary | ICD-10-CM | POA: Diagnosis not present

## 2020-07-09 DIAGNOSIS — L97509 Non-pressure chronic ulcer of other part of unspecified foot with unspecified severity: Secondary | ICD-10-CM | POA: Diagnosis not present

## 2020-07-09 DIAGNOSIS — E1169 Type 2 diabetes mellitus with other specified complication: Secondary | ICD-10-CM | POA: Diagnosis not present

## 2020-07-09 DIAGNOSIS — I110 Hypertensive heart disease with heart failure: Secondary | ICD-10-CM | POA: Diagnosis not present

## 2020-07-09 DIAGNOSIS — E11621 Type 2 diabetes mellitus with foot ulcer: Secondary | ICD-10-CM | POA: Diagnosis not present

## 2020-07-09 DIAGNOSIS — J849 Interstitial pulmonary disease, unspecified: Secondary | ICD-10-CM | POA: Diagnosis not present

## 2020-07-10 DIAGNOSIS — B965 Pseudomonas (aeruginosa) (mallei) (pseudomallei) as the cause of diseases classified elsewhere: Secondary | ICD-10-CM | POA: Diagnosis not present

## 2020-07-10 DIAGNOSIS — L97412 Non-pressure chronic ulcer of right heel and midfoot with fat layer exposed: Secondary | ICD-10-CM | POA: Diagnosis not present

## 2020-07-10 DIAGNOSIS — B9562 Methicillin resistant Staphylococcus aureus infection as the cause of diseases classified elsewhere: Secondary | ICD-10-CM | POA: Diagnosis not present

## 2020-07-10 DIAGNOSIS — E11621 Type 2 diabetes mellitus with foot ulcer: Secondary | ICD-10-CM | POA: Diagnosis not present

## 2020-07-10 DIAGNOSIS — M869 Osteomyelitis, unspecified: Secondary | ICD-10-CM | POA: Diagnosis not present

## 2020-07-10 DIAGNOSIS — E1169 Type 2 diabetes mellitus with other specified complication: Secondary | ICD-10-CM | POA: Diagnosis not present

## 2020-07-13 DIAGNOSIS — E1169 Type 2 diabetes mellitus with other specified complication: Secondary | ICD-10-CM | POA: Diagnosis not present

## 2020-07-13 DIAGNOSIS — L97412 Non-pressure chronic ulcer of right heel and midfoot with fat layer exposed: Secondary | ICD-10-CM | POA: Diagnosis not present

## 2020-07-13 DIAGNOSIS — B9562 Methicillin resistant Staphylococcus aureus infection as the cause of diseases classified elsewhere: Secondary | ICD-10-CM | POA: Diagnosis not present

## 2020-07-13 DIAGNOSIS — E11621 Type 2 diabetes mellitus with foot ulcer: Secondary | ICD-10-CM | POA: Diagnosis not present

## 2020-07-13 DIAGNOSIS — B965 Pseudomonas (aeruginosa) (mallei) (pseudomallei) as the cause of diseases classified elsewhere: Secondary | ICD-10-CM | POA: Diagnosis not present

## 2020-07-13 DIAGNOSIS — M869 Osteomyelitis, unspecified: Secondary | ICD-10-CM | POA: Diagnosis not present

## 2020-07-15 ENCOUNTER — Other Ambulatory Visit: Payer: Self-pay

## 2020-07-15 ENCOUNTER — Encounter (HOSPITAL_BASED_OUTPATIENT_CLINIC_OR_DEPARTMENT_OTHER): Payer: Medicare Other | Admitting: Physician Assistant

## 2020-07-15 DIAGNOSIS — L97529 Non-pressure chronic ulcer of other part of left foot with unspecified severity: Secondary | ICD-10-CM | POA: Diagnosis not present

## 2020-07-15 DIAGNOSIS — E1151 Type 2 diabetes mellitus with diabetic peripheral angiopathy without gangrene: Secondary | ICD-10-CM | POA: Diagnosis not present

## 2020-07-15 DIAGNOSIS — E1142 Type 2 diabetes mellitus with diabetic polyneuropathy: Secondary | ICD-10-CM | POA: Diagnosis not present

## 2020-07-15 DIAGNOSIS — E11621 Type 2 diabetes mellitus with foot ulcer: Secondary | ICD-10-CM | POA: Diagnosis not present

## 2020-07-15 DIAGNOSIS — I110 Hypertensive heart disease with heart failure: Secondary | ICD-10-CM | POA: Diagnosis not present

## 2020-07-15 DIAGNOSIS — F17218 Nicotine dependence, cigarettes, with other nicotine-induced disorders: Secondary | ICD-10-CM | POA: Diagnosis not present

## 2020-07-15 DIAGNOSIS — I5042 Chronic combined systolic (congestive) and diastolic (congestive) heart failure: Secondary | ICD-10-CM | POA: Diagnosis not present

## 2020-07-15 DIAGNOSIS — Z794 Long term (current) use of insulin: Secondary | ICD-10-CM | POA: Diagnosis not present

## 2020-07-15 DIAGNOSIS — Z89411 Acquired absence of right great toe: Secondary | ICD-10-CM | POA: Diagnosis not present

## 2020-07-15 DIAGNOSIS — L97512 Non-pressure chronic ulcer of other part of right foot with fat layer exposed: Secondary | ICD-10-CM | POA: Diagnosis not present

## 2020-07-15 NOTE — Progress Notes (Addendum)
[name] ([id number]) Visit Report for 07/15/2020 Chief Complaint Document Details Patient Name: Date of Service: [name], RA NDO [name] 07/15/2020 2:00 PM Medical Record Number: [id number] Patient Account Number: [id number] Date of Birth/Sex: Treating RN: May 18, 1970 (50 y.o. [name] Primary Care Provider: [name] Other Clinician: Referring Provider: Treating Provider/Extender: [name] in Treatment: 10 Information Obtained from: Patient Chief Complaint Right foot ulcer Electronic Signature(s) Signed: 07/15/2020 2:34:46 PM By: Lenda Kelp PA-C Entered By: Lenda Kelp on 07/15/2020 14:34:45 -------------------------------------------------------------------------------- Debridement Details Patient Name: Date of Service: [name], RA NDO [facility] 07/15/2020 2:00 PM Medical Record Number: [id number] Patient Account Number: [id number] Date of Birth/Sex: Treating RN: Feb 11, 1970 (50 y.o. [name] Primary Care Provider: [name] Other Clinician: Referring Provider: Treating Provider/Extender: [name] in Treatment: 10 Debridement Performed for Assessment: Wound #3 Right,Medial Foot Performed By: Physician Lenda Kelp, PA Debridement Type: Debridement Severity of Tissue Pre Debridement: Fat layer exposed Level of Consciousness (Pre-procedure): Awake and Alert Pre-procedure Verification/Time Out Yes - 15:20 Taken: Start Time: 15:20 T Area Debrided (L x W): otal 1 (cm) x 0.5 (cm) = 0.5 (cm) Tissue and other material debrided: Viable, Non-Viable, Callus, Skin: Epidermis Level: Skin/Epidermis Debridement Description: Selective/Open Wound Instrument: Curette Bleeding: Minimum Hemostasis Achieved: Pressure Procedural Pain: 0 Post Procedural Pain: 0 Response to Treatment: Procedure was tolerated well Level of Consciousness (Post- Awake and Alert procedure): Post Debridement  Measurements of Total Wound Length: (cm) 0.1 Width: (cm) 0.1 Depth: (cm) 0.1 Volume: (cm) 0.001 Character of Wound/Ulcer Post Debridement: Improved Severity of Tissue Post Debridement: Fat layer exposed Post Procedure Diagnosis Same as Pre-procedure Electronic Signature(s) Signed: 07/15/2020 4:36:36 PM By: Lenda Kelp PA-C Signed: 07/15/2020 5:41:46 PM By: [name] RN, BSN Entered By: [name] on 07/15/2020 15:25:32 -------------------------------------------------------------------------------- HPI Details Patient Name: Date of Service: [name], RA NDO [facility] 07/15/2020 2:00 PM Medical Record Number: [id number] Patient Account Number: [id number] Date of Birth/Sex: Treating RN: [birth year] (50 y.o. [name] Primary Care Provider: [name] Other Clinician: Referring Provider: Treating Provider/Extender: [name] in Treatment: 10 History of Present Illness HPI Description: ADMISSION 05/13/2019 This is a 50 year old man who has type 2 diabetes with peripheral neuropathy. He has a history of wounds on the plantar aspect of his left first and fourth toes. He has had these for several months. He was being seen in the wound care center at [facility] in [facility]. He apparently presented with swelling and an open wound at the tip of the toe. An MRI showed osteomyelitis. He was given 6 to 8 weeks of oral doxycycline again all of this via the patient we have none of these records. Since then he has been putting Goldbond on the areas wearing regular shoes. He was seen by his primary physician on 4/9 and sent down here for our review. In the primary care note it says he has been recommended for hyperbaric oxygen. Past medical history includes type 2 diabetes with peripheral neuropathy, heart failure with reduced ejection fraction 30 to 35%, peripheral arterial disease, coronary artery disease, hyperlipidemia,  hypertension, syncope and a prior history of a right great toe amputation also by Dr. Lynden Ang in [place] ABIs in our clinic were 0.91 on the left. Also notable that in 2019 he appears to have had arterial studies that are visible in our system. At that point the right ABI was 1.17,  TBI of 0.85 with triphasic waveforms. On the left his ABI was 1.19 with a TBI of 0.73 again with triphasic waveform 5/3; we did receive some information from [facility] wound care. The patient was seen there on 1/29. At that point he had wounds on the right foot at the amputation site the fifth digit fourth digit I am presuming on the right and then an area on the left first digit although that is not specifically stated, he has had a previous amputation on the right however. It would appear him at that time most of his ulcers were on the right the left foot is stated to have a lot of scales and calluses but not a lot of open wounds. The only wounds we define when he came in here last time were on the left first and left fourth toe He also had a wound culture that showed heavy growth of staph aureus and a slight growth of Stenotrophomonas maltophilia. The doxycycline should cover the staph aureus I am not sure about the stenotrophomonas. In any case he completed 6 weeks of this. UNFORTUNATELY I still do not have a copy of the MRI. And the exact justification for hyperbarics is therefore lacking. 5/10; the patient had osteomyelitis in the left fourth toe. He was treated for 6 weeks of doxycycline this wound is healed as is the left first toe. He was sent here for hyperbaric oxygen however at this point his wounds are healed and I think a course of watchful waiting and observation is in order. If the left fourth toe reopens then it is likely he will need a more protracted and aggressive treatment approach Readmission: 05/06/20 upon evaluation today patient presents for readmission here in the clinic exam issues with his right  foot on the medial aspect at the amputation site where his great toe was this is the first metatarsal area that is affected. He has been seeing Dr. Marcha Solders who performed the surgery. With that being said currently the patient was diagnosed as having MRSA on a culture that was + March 31. Has been on IV vancomycin for 6 weeks he is now on doxycycline as the MRSA was sensitive to Doxy and he does have 2 more refills he has been on that for 3 weeks already. He has MRI of January for showed L knee first metatarsal base early osteomyelitis versus possible reactive disease but nonetheless based on what was seen it appears osteomyelitis is more likely the cause here. He has had Apligraf although to be honest that did not seem to do the job. He is also been using Dakin's solution and currently is using Aquacel. Sharp debridement has been performed by Dr. Marcha Solders on a weekly basis according to what the patient tells me. With all that being said the patient was referred to [place] for further evaluation and treatment as unfortunately he does not seem to be responding well to his treatment with the surgeon. They have considered a total contact cast it sounds like but again with the infection that this was not a good idea. The patient does have a significant past medical history for diabetes mellitus type 2 for which she is on insulin, hypertension, significant congestive heart failure with a most recent ejection fraction of 30 to 35% and that was in 2020. He has not seen his cardiologist [name] since that time. In regard to his diabetes his A1c he does not even know he tells me his blood sugars run somewhere in  the 200-300 range. With that being said he has not seen the primary care provider recently for management of this either. The patient tells me he is also a current smoker. He tells me he is trying to stop using nicotine pouches but he just does not have enough saliva he tells me his mouth is dry all the time  this is probably a subsequent issue from the diabetes as well. Unfortunately he just appears to be in a place where he is not necessarily at his healthiest even with his medical conditions. I think he would be a candidate for hyperbaric oxygen therapy but there are a lot of other things that need to be in place first before we get to that point. 05/13/2020 patient presents for 1 week follow-up. He has been using Hydrofera Blue every other day without any issues. He had to reschedule his cardiologist appointment due to transportation Issues. He has no complaints today. 05/20/2020 upon evaluation today patient appears to be doing well with regard to his foot ulcer all things considered. With that being said he tells me that he has been tolerating the dressing changes without complication. There does not appear to be any signs of active infection which is great news and overall very pleased with where things stand today. No fevers, chills, nausea, vomiting, or diarrhea. 05/27/2020 upon evaluation today patient appears to be doing well with regard to his wound on the foot. He does not require little bit of debridement today but overall he seems to be doing quite well. He actually has his echo on Monday we will see what that shows as well were hoping to be able to get him into the hyperbaric oxygen chamber but again a lot depends on what this test shows he will also need a chest x-ray if this turns out okay before going in the chamber. 06/03/2020 upon evaluation today patient's wound actually showing signs of minimal improvement. Fortunately I think that he is continuing to make great progress all things considered. He does have his echocardiogram next week. That is good news so we can see whether or not hyperbaric oxygen therapy would be appropriate for him I am hopeful we will be able to get him in the chamber sooner rather than later but again it all depends on his ejection fraction. Right now we have been  somewhat limited in being able to proceed with that as to be perfectly honest we just have not gotten the needed testing in order to go forward with the hyperbarics. If his ejection fraction is too low which could be the case then obviously is not good to be able to 5/25; patient presents for 1 week follow-up. He states he has his echo set up for next week. He overall feels well. He denies any issues over the past week. He denies signs of infection. 06/17/2020 upon evaluation today patient appears to be doing about the same in regard to his foot ulcer is measuring a little bit smaller today but still keeps developing the spongy tissue. I really think it may be a good idea for [place] to obtain a sample of a punch biopsy to send for evaluation. He is in agreement with this plan. Subsequently he did have his echocardiogram which showed that he has a systolic left ventricular function which is moderately decreased estimated to be 30 to 35%. He does have a follow-up appointment with the cardiologist on Monday to see what they think about this. I advised that he needs  to talk to them as well that what they feel about him potentially going into hyperbaric oxygen therapy if his heart would be sufficient for this or not. Obviously if they feel that it is something that would be sufficient then we need to have this in writing. Otherwise patient tells me that he has been doing fairly well is not really having any significant pain. 06/24/2020 upon evaluation today patient appears to be doing decently well in regard to his wound. In fact he does not have as much of the tissue that grew back this time. I do see that he had the results back from his skin biopsy. This showed reactive squamous hyperplasia. This again seems to be somewhat similar to lichen planus and how that would function. With that being said there was no malignancy and potentially this means it could be a result and a response to like a topical steroid like  triamcinolone. I believe this may at least be something to get a shot at this point. The patient is in agreement with the plan. I am very pleased though there is no malignancy noted. We also got a letter back from his cardiologist stating that he is from a cardiac perspective able to participate in hyperbaric oxygen therapy. Therefore if need be I think we can proceed as such. For that reason I am going to send in for the chest x-ray in order to ensure that we have what we need in place if we do need to proceed down that road. 07/01/2020 upon evaluation today patient appears to be doing decently well in regard to his wound. He has been tolerating the dressing changes without complication. Fortunately there does not appear to be any signs of active infection which is great news. No fever chills noted. He does note that he had a little bit of increased pain over the past week this seems to be kind of a neuropathy type issue to be honest. It does seem to be coming from his wound and I do not think it is coming from anywhere else in his foot although he does note some referred pain that seems to be traveling a little bit on him. Overall I think that in general this is just more of a bit of a mind game in that regard not that he is not hurting but rather where he is experiencing the pain at. Either way I do believe that the wound is doing much better as compared to what it was previous. 07/08/2020 upon evaluation today patient's wound actually appears to be doing excellent. He is making great progress and the triamcinolone has done excellent for him. There does not appear to be any signs of active infection which is great news. No fevers, chills, nausea, vomiting, or diarrhea. 07/16/2018 upon evaluation today patient appears to be doing well currently in regard to his wound. In fact this is getting very close to complete resolution. I am extremely pleased with where we see things standing today. There does not  appear to be any evidence of active infection which is great and overall happy in that regard as well. Fortunately the patient does not seem to be making any backslide as far as the wound is concerned and overall I really think that he is making excellent progress to be honest. No fevers, chills, nausea, vomiting, or diarrhea. Electronic Signature(s) Signed: 07/15/2020 3:28:48 PM By: Lenda Kelp PA-C Entered By: Lenda Kelp on 07/15/2020 15:28:48 -------------------------------------------------------------------------------- Physical Exam Details Patient Name:  Date of Service: [name], RA NDO [facility] 07/15/2020 2:00 PM Medical Record Number: [id number] Patient Account Number: [id number] Date of Birth/Sex: Treating RN: September 10, 1970 (50 y.o. [name] Primary Care Provider: [name] Other Clinician: Referring Provider: Treating Provider/Extender: [name] in Treatment: 10 Constitutional Well-nourished and well-hydrated in no acute distress. Respiratory normal breathing without difficulty. Psychiatric this patient is able to make decisions and demonstrates good insight into disease process. Alert and Oriented x 3. pleasant and cooperative. Notes Patient's wound bed showed signs of good granulation epithelization I did remove some of the overgrowth of tissue here today he tolerated that without complication and postdebridement the wound bed appears to be doing much better this is very superficial in fact almost completely closed. I think within the next 1-2 weeks he is likely going to be closed. Electronic Signature(s) Signed: 07/15/2020 3:29:09 PM By: Lenda Kelp PA-C Entered By: Lenda Kelp on 07/15/2020 15:29:09 -------------------------------------------------------------------------------- Physician Orders Details Patient Name: Date of Service: [name], RA NDO [facility] 07/15/2020 2:00 PM Medical Record Number:  [id number] Patient Account Number: [id number] Date of Birth/Sex: Treating RN: 02/15/1970 (50 y.o. [name] Primary Care Provider: [name] Other Clinician: Referring Provider: Treating Provider/Extender: [name] in Treatment: 10 Verbal / Phone Orders: No Diagnosis Coding ICD-10 Coding Code Description E11.621 Type 2 diabetes mellitus with foot ulcer L97.512 Non-pressure chronic ulcer of other part of right foot with fat layer exposed E11.42 Type 2 diabetes mellitus with diabetic polyneuropathy I10 Essential (primary) hypertension I50.42 Chronic combined systolic (congestive) and diastolic (congestive) heart failure F17.218 Nicotine dependence, cigarettes, with other nicotine-induced disorders Follow-up Appointments ppointment in 1 week. - with Leonard Schwartz Return A Bathing/ Shower/ Hygiene May shower with protection but do not get wound dressing(s) wet. Off-Loading Wedge shoe to: - right foot to ambulate Additional Orders / Instructions Stop/Decrease Smoking Follow Nutritious Diet Home Health No change in wound care orders this week; continue Home Health for wound care. May utilize formulary equivalent dressing for wound treatment orders unless otherwise specified. Other Home Health Orders/Instructions: - [facility] Wound Treatment Wound #3 - Foot Wound Laterality: Right, Medial Topical: Triamcinolone 1 x Per Day/30 Days Discharge Instructions: Apply thin layer Triamcinolone to the wound bed Prim Dressing: Hydrofera Blue Classic Foam, 4x4 in 1 x Per Day/30 Days ary Discharge Instructions: Moisten with saline prior to applying to wound,cut to fit inside wound edges Secondary Dressing: Zetuvit Plus Silicone Border Dressing 5x5 (in/in) 1 x Per Day/30 Days Discharge Instructions: Apply silicone border over primary dressing , or may use gauze and secure with rolled gauze Electronic Signature(s) Signed: 07/15/2020 4:36:36 PM By: Lenda Kelp  PA-C Signed: 07/15/2020 5:41:46 PM By: [name] RN, BSN Entered By: [name] on 07/15/2020 15:26:30 -------------------------------------------------------------------------------- Problem List Details Patient Name: Date of Service: [name], RA NDO [facility] 07/15/2020 2:00 PM Medical Record Number: [id number] Patient Account Number: [id number] Date of Birth/Sex: Treating RN: [birth year] (50 y.o. [name] Primary Care Provider: [name] Other Clinician: Referring Provider: Treating Provider/Extender: [name] in Treatment: 10 Active Problems ICD-10 Encounter Code Description Active Date MDM Diagnosis E11.621 Type 2 diabetes mellitus with foot ulcer 05/06/2020 No Yes L97.512 Non-pressure chronic ulcer of other part of right foot with fat layer exposed 05/06/2020 No Yes E11.42 Type 2 diabetes mellitus with diabetic polyneuropathy 05/06/2020 No Yes I10 Essential (primary) hypertension 05/06/2020 No Yes I50.42 Chronic combined systolic (congestive) and diastolic (  congestive) heart failure 05/06/2020 No Yes F17.218 Nicotine dependence, cigarettes, with other nicotine-induced disorders 05/06/2020 No Yes Inactive Problems Resolved Problems Electronic Signature(s) Signed: 07/15/2020 2:34:40 PM By: Lenda Kelp PA-C Entered By: Lenda Kelp on 07/15/2020 14:34:39 -------------------------------------------------------------------------------- Progress Note Details Patient Name: Date of Service: [name], RA NDO [facility] 07/15/2020 2:00 PM Medical Record Number: [id number] Patient Account Number: [id number] Date of Birth/Sex: Treating RN: [birth year] (50 y.o. [name] Primary Care Provider: [name] Other Clinician: Referring Provider: Treating Provider/Extender: [name] in Treatment: 10 Subjective Chief Complaint Information obtained from Patient Right foot ulcer History of Present  Illness (HPI) ADMISSION 05/13/2019 This is a 51 year old man who has type 2 diabetes with peripheral neuropathy. He has a history of wounds on the plantar aspect of his left first and fourth toes. He has had these for several months. He was being seen in the wound care center at [facility] in [facility]. He apparently presented with swelling and an open wound at the tip of the toe. An MRI showed osteomyelitis. He was given 6 to 8 weeks of oral doxycycline again all of this via the patient we have none of these records. Since then he has been putting Goldbond on the areas wearing regular shoes. He was seen by his primary physician on 4/9 and sent down here for our review. In the primary care note it says he has been recommended for hyperbaric oxygen. Past medical history includes type 2 diabetes with peripheral neuropathy, heart failure with reduced ejection fraction 30 to 35%, peripheral arterial disease, coronary artery disease, hyperlipidemia, hypertension, syncope and a prior history of a right great toe amputation also by Dr. Lynden Ang in [place] ABIs in our clinic were 0.91 on the left. Also notable that in 2019 he appears to have had arterial studies that are visible in our system. At that point the right ABI was 1.17, TBI of 0.85 with triphasic waveforms. On the left his ABI was 1.19 with a TBI of 0.73 again with triphasic waveform 5/3; we did receive some information from [facility] wound care. The patient was seen there on 1/29. At that point he had wounds on the right foot at the amputation site the fifth digit fourth digit I am presuming on the right and then an area on the left first digit although that is not specifically stated, he has had a previous amputation on the right however. It would appear him at that time most of his ulcers were on the right the left foot is stated to have a lot of scales and calluses but not a lot of open wounds. The only wounds we define when he  came in here last time were on the left first and left fourth toe He also had a wound culture that showed heavy growth of staph aureus and a slight growth of Stenotrophomonas maltophilia. The doxycycline should cover the staph aureus I am not sure about the stenotrophomonas. In any case he completed 6 weeks of this. UNFORTUNATELY I still do not have a copy of the MRI. And the exact justification for hyperbarics is therefore lacking. 5/10; the patient had osteomyelitis in the left fourth toe. He was treated for 6 weeks of doxycycline this wound is healed as is the left first toe. He was sent here for hyperbaric oxygen however at this point his wounds are healed and I think a course of watchful waiting and observation is in order. If the  left fourth toe reopens then it is likely he will need a more protracted and aggressive treatment approach Readmission: 05/06/20 upon evaluation today patient presents for readmission here in the clinic exam issues with his right foot on the medial aspect at the amputation site where his great toe was this is the first metatarsal area that is affected. He has been seeing Dr. Marcha Solders who performed the surgery. With that being said currently the patient was diagnosed as having MRSA on a culture that was + March 31. Has been on IV vancomycin for 6 weeks he is now on doxycycline as the MRSA was sensitive to Doxy and he does have 2 more refills he has been on that for 3 weeks already. He has MRI of January for showed L knee first metatarsal base early osteomyelitis versus possible reactive disease but nonetheless based on what was seen it appears osteomyelitis is more likely the cause here. He has had Apligraf although to be honest that did not seem to do the job. He is also been using Dakin's solution and currently is using Aquacel. Sharp debridement has been performed by Dr. Marcha Solders on a weekly basis according to what the patient tells me. With all that being said the patient  was referred to [place] for further evaluation and treatment as unfortunately he does not seem to be responding well to his treatment with the surgeon. They have considered a total contact cast it sounds like but again with the infection that this was not a good idea. The patient does have a significant past medical history for diabetes mellitus type 2 for which she is on insulin, hypertension, significant congestive heart failure with a most recent ejection fraction of 30 to 35% and that was in 2020. He has not seen his cardiologist [name] since that time. In regard to his diabetes his A1c he does not even know he tells me his blood sugars run somewhere in the 200-300 range. With that being said he has not seen the primary care provider recently for management of this either. The patient tells me he is also a current smoker. He tells me he is trying to stop using nicotine pouches but he just does not have enough saliva he tells me his mouth is dry all the time this is probably a subsequent issue from the diabetes as well. Unfortunately he just appears to be in a place where he is not necessarily at his healthiest even with his medical conditions. I think he would be a candidate for hyperbaric oxygen therapy but there are a lot of other things that need to be in place first before we get to that point. 05/13/2020 patient presents for 1 week follow-up. He has been using Hydrofera Blue every other day without any issues. He had to reschedule his cardiologist appointment due to transportation Issues. He has no complaints today. 05/20/2020 upon evaluation today patient appears to be doing well with regard to his foot ulcer all things considered. With that being said he tells me that he has been tolerating the dressing changes without complication. There does not appear to be any signs of active infection which is great news and overall very pleased with where things stand today. No fevers, chills, nausea,  vomiting, or diarrhea. 05/27/2020 upon evaluation today patient appears to be doing well with regard to his wound on the foot. He does not require little bit of debridement today but overall he seems to be doing quite well. He actually has  his echo on Monday we will see what that shows as well were hoping to be able to get him into the hyperbaric oxygen chamber but again a lot depends on what this test shows he will also need a chest x-ray if this turns out okay before going in the chamber. 06/03/2020 upon evaluation today patient's wound actually showing signs of minimal improvement. Fortunately I think that he is continuing to make great progress all things considered. He does have his echocardiogram next week. That is good news so we can see whether or not hyperbaric oxygen therapy would be appropriate for him I am hopeful we will be able to get him in the chamber sooner rather than later but again it all depends on his ejection fraction. Right now we have been somewhat limited in being able to proceed with that as to be perfectly honest we just have not gotten the needed testing in order to go forward with the hyperbarics. If his ejection fraction is too low which could be the case then obviously is not good to be able to 5/25; patient presents for 1 week follow-up. He states he has his echo set up for next week. He overall feels well. He denies any issues over the past week. He denies signs of infection. 06/17/2020 upon evaluation today patient appears to be doing about the same in regard to his foot ulcer is measuring a little bit smaller today but still keeps developing the spongy tissue. I really think it may be a good idea for [place] to obtain a sample of a punch biopsy to send for evaluation. He is in agreement with this plan. Subsequently he did have his echocardiogram which showed that he has a systolic left ventricular function which is moderately decreased estimated to be 30 to 35%. He does have  a follow-up appointment with the cardiologist on Monday to see what they think about this. I advised that he needs to talk to them as well that what they feel about him potentially going into hyperbaric oxygen therapy if his heart would be sufficient for this or not. Obviously if they feel that it is something that would be sufficient then we need to have this in writing. Otherwise patient tells me that he has been doing fairly well is not really having any significant pain. 06/24/2020 upon evaluation today patient appears to be doing decently well in regard to his wound. In fact he does not have as much of the tissue that grew back this time. I do see that he had the results back from his skin biopsy. This showed reactive squamous hyperplasia. This again seems to be somewhat similar to lichen planus and how that would function. With that being said there was no malignancy and potentially this means it could be a result and a response to like a topical steroid like triamcinolone. I believe this may at least be something to get a shot at this point. The patient is in agreement with the plan. I am very pleased though there is no malignancy noted. We also got a letter back from his cardiologist stating that he is from a cardiac perspective able to participate in hyperbaric oxygen therapy. Therefore if need be I think we can proceed as such. For that reason I am going to send in for the chest x-ray in order to ensure that we have what we need in place if we do need to proceed down that road. 07/01/2020 upon evaluation today patient appears to  be doing decently well in regard to his wound. He has been tolerating the dressing changes without complication. Fortunately there does not appear to be any signs of active infection which is great news. No fever chills noted. He does note that he had a little bit of increased pain over the past week this seems to be kind of a neuropathy type issue to be honest. It does  seem to be coming from his wound and I do not think it is coming from anywhere else in his foot although he does note some referred pain that seems to be traveling a little bit on him. Overall I think that in general this is just more of a bit of a mind game in that regard not that he is not hurting but rather where he is experiencing the pain at. Either way I do believe that the wound is doing much better as compared to what it was previous. 07/08/2020 upon evaluation today patient's wound actually appears to be doing excellent. He is making great progress and the triamcinolone has done excellent for him. There does not appear to be any signs of active infection which is great news. No fevers, chills, nausea, vomiting, or diarrhea. 07/16/2018 upon evaluation today patient appears to be doing well currently in regard to his wound. In fact this is getting very close to complete resolution. I am extremely pleased with where we see things standing today. There does not appear to be any evidence of active infection which is great and overall happy in that regard as well. Fortunately the patient does not seem to be making any backslide as far as the wound is concerned and overall I really think that he is making excellent progress to be honest. No fevers, chills, nausea, vomiting, or diarrhea. Objective Constitutional Well-nourished and well-hydrated in no acute distress. Vitals Time Taken: 2:36 PM, Height: 72 in, Weight: 304 lbs, BMI: 41.2, Temperature: 98.4 F, Pulse: 69 bpm, Respiratory Rate: 18 breaths/min, Blood Pressure: 99/67 mmHg. Respiratory normal breathing without difficulty. Psychiatric this patient is able to make decisions and demonstrates good insight into disease process. Alert and Oriented x 3. pleasant and cooperative. General Notes: Patient's wound bed showed signs of good granulation epithelization I did remove some of the overgrowth of tissue here today he tolerated that without  complication and postdebridement the wound bed appears to be doing much better this is very superficial in fact almost completely closed. I think within the next 1-2 weeks he is likely going to be closed. Integumentary (Hair, Skin) Wound #3 status is Open. Original cause of wound was Gradually Appeared. The date acquired was: 07/18/2019. The wound has been in treatment 10 weeks. The wound is located on the Right,Medial Foot. The wound measures 0.1cm length x 0.1cm width x 0.1cm depth; 0.008cm^2 area and 0.001cm^3 volume. There is Fat Layer (Subcutaneous Tissue) exposed. There is no tunneling or undermining noted. There is a small amount of serosanguineous drainage noted. The wound margin is thickened. There is large (67-100%) red, pink granulation within the wound bed. There is no necrotic tissue within the wound bed. 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 There was a Selective/Open Wound Skin/Epidermis Debridement with a total area of 0.5 sq cm performed by Lenda KelpStone [name], Marquavious Nazar, PA. With the following instrument(s): Curette to remove Viable and Non-Viable tissue/material. Material removed includes Callus and Skin: Epidermis and. No specimens were taken. A time out was conducted at 15:20, prior to the start of the procedure. A Minimum amount of bleeding was controlled with Pressure. The procedure was tolerated well with a pain level of 0 throughout and a pain  level of 0 following the procedure. Post Debridement Measurements: 0.1cm length x 0.1cm width x 0.1cm depth; 0.001cm^3 volume. Character of Wound/Ulcer Post Debridement is improved. Severity of Tissue Post Debridement is: Fat layer exposed. Post procedure Diagnosis Wound #3: Same as Pre-Procedure Plan Follow-up Appointments: Return Appointment in 1 week. - with Luana ShuHoyt Bathing/ Shower/ Hygiene: May shower with protection but do not get wound dressing(s) wet. Off-Loading: Wedge shoe to: - right foot to ambulate Additional Orders / Instructions: Stop/Decrease Smoking Follow Nutritious Diet Home Health: No change in wound care orders this week; continue Home Health for wound care. May utilize formulary equivalent dressing for wound treatment orders unless otherwise specified. Other Home Health Orders/Instructions: - [facility] WOUND #3: - Foot Wound Laterality: Right, Medial Topical: Triamcinolone 1 x Per Day/30 Days Discharge Instructions: Apply thin layer Triamcinolone to the wound bed Prim Dressing: Hydrofera Blue Classic Foam, 4x4 in 1 x Per Day/30 Days ary Discharge Instructions: Moisten with saline prior to applying to wound,cut to fit inside wound edges Secondary Dressing: Zetuvit Plus Silicone Border Dressing 5x5 (in/in) 1 x Per Day/30 Days Discharge Instructions: Apply silicone border over primary dressing , or may use gauze and secure with rolled gauze 1. Would recommend currently that we go ahead and initiate treatment with a continuation of the triamcinolone followed by [facility] Dtydrofera Blue I think that is done a great job. 2. I am also can recommend that we have the patient go ahead and continue with the dressing to cover this such as a large Band-Aid or border foam dressing. 3. I am also can recommend based on his x-ray which I did review today that showed that he had some densities in the lungs that were concerning. Nonetheless it was recommended that he have a CT scan without  contrast to further evaluate this. He actually is going be seeing his primary care provider later this month with that being said I would recommend that he actually take a copy of this that we can give him today to his primary care provider now so that they can go ahead and see about possibly ordering the test well they get all that back and then they can discuss it with him at his appointment if not sooner. He voiced understanding. Again this was an incidental finding and that was part of his HBO screening process that we were doing previously. We will see patient back for reevaluation in 1 week here in the clinic. If anything worsens or changes patient will contact our office for additional recommendations. Electronic Signature(s) Signed: 07/15/2020 3:30:11 PM By: Lenda KelpStone [name], Kanav Kazmierczak PA-C Entered By: Lenda KelpStone [name], Shimeka Bacot on 07/15/2020 15:30:11 -------------------------------------------------------------------------------- SuperBill Details Patient Name: Date of Service: Jose CrawEYNO LDS [name], RA NDO [facility]/2020 Medical Record Number: [id number] Patient Account Number: [id number] Date of Birth/Sex: Treating RN: 28-Jun-1970 (50 y.o. Jose SchoonerM) Boehlein, Linda Primary Care Provider: Lia HoppingHasanaj, Xaje Other Clinician: Referring Provider: Treating Provider/Extender: Laurann MontanaStone [name], Timathy Newberry Hasanaj, Xaje Weeks in  Treatment: 10 Diagnosis Coding ICD-10 Codes Code Description E11.621 Type 2 diabetes mellitus with foot ulcer L97.512 Non-pressure chronic ulcer of other part of right foot with fat layer exposed E11.42 Type 2 diabetes mellitus with diabetic polyneuropathy I10 Essential (primary) hypertension I50.42 Chronic combined systolic (congestive) and diastolic (congestive) heart failure F17.218 Nicotine dependence, cigarettes, with other nicotine-induced disorders Facility Procedures CPT4 Code: [id number] Description: [phone] - DEBRIDE WOUND 1ST 20 SQ CM OR < ICD-10 Diagnosis Description L97.512 Non-pressure chronic ulcer of  other part of right foot with fat layer exposed Modifier: Quantity: 1 Physician Procedures : CPT4 Code Description Modifier [id number] - WC PHYS LEVEL 4 - EST PT ICD-10 Diagnosis Description E11.621 Type 2 diabetes mellitus with foot ulcer L97.512 Non-pressure chronic ulcer of other part of right foot with fat layer exposed E11.42 Type 2  diabetes mellitus with diabetic polyneuropathy I10 Essential (primary) hypertension Quantity: 1 : [id number] - WC PHYS DEBR WO ANESTH 20 SQ CM ICD-10 Diagnosis Description L97.512 Non-pressure chronic ulcer of other part of right foot with fat layer exposed Quantity: 1 Electronic Signature(s) Signed: 07/15/2020 3:30:29 PM By: Lenda Kelp PA-C Entered By: Lenda Kelp on 07/15/2020 15:30:28

## 2020-07-16 DIAGNOSIS — E11621 Type 2 diabetes mellitus with foot ulcer: Secondary | ICD-10-CM | POA: Diagnosis not present

## 2020-07-16 DIAGNOSIS — B9562 Methicillin resistant Staphylococcus aureus infection as the cause of diseases classified elsewhere: Secondary | ICD-10-CM | POA: Diagnosis not present

## 2020-07-16 DIAGNOSIS — E1169 Type 2 diabetes mellitus with other specified complication: Secondary | ICD-10-CM | POA: Diagnosis not present

## 2020-07-16 DIAGNOSIS — L97412 Non-pressure chronic ulcer of right heel and midfoot with fat layer exposed: Secondary | ICD-10-CM | POA: Diagnosis not present

## 2020-07-16 DIAGNOSIS — B965 Pseudomonas (aeruginosa) (mallei) (pseudomallei) as the cause of diseases classified elsewhere: Secondary | ICD-10-CM | POA: Diagnosis not present

## 2020-07-16 DIAGNOSIS — M869 Osteomyelitis, unspecified: Secondary | ICD-10-CM | POA: Diagnosis not present

## 2020-07-21 DIAGNOSIS — B965 Pseudomonas (aeruginosa) (mallei) (pseudomallei) as the cause of diseases classified elsewhere: Secondary | ICD-10-CM | POA: Diagnosis not present

## 2020-07-21 DIAGNOSIS — B9562 Methicillin resistant Staphylococcus aureus infection as the cause of diseases classified elsewhere: Secondary | ICD-10-CM | POA: Diagnosis not present

## 2020-07-21 DIAGNOSIS — L97412 Non-pressure chronic ulcer of right heel and midfoot with fat layer exposed: Secondary | ICD-10-CM | POA: Diagnosis not present

## 2020-07-21 DIAGNOSIS — M869 Osteomyelitis, unspecified: Secondary | ICD-10-CM | POA: Diagnosis not present

## 2020-07-21 DIAGNOSIS — E11621 Type 2 diabetes mellitus with foot ulcer: Secondary | ICD-10-CM | POA: Diagnosis not present

## 2020-07-21 DIAGNOSIS — E1169 Type 2 diabetes mellitus with other specified complication: Secondary | ICD-10-CM | POA: Diagnosis not present

## 2020-07-21 NOTE — Progress Notes (Signed)
PRATHER, FAILLA ([id number]) Visit Report for 07/15/2020 Arrival Information Details Patient Name: Date of Service: [name], RA NDO [facility] 07/15/2020 2:00 PM Medical Record Number: [id number] Patient Account Number: [id number] Date of Birth/Sex: Treating RN: [birth year] (50 y.o. [name] Primary Care Jazalyn Mondor: Stoney Bang Other Clinician: Referring Bravery Ketcham: Treating Ruhama Lehew/Extender: Dallas Breeding in Treatment: 10 Visit Information History Since Last Visit Added or deleted any medications: No Patient Arrived: Ambulatory Any new allergies or adverse reactions: No Arrival Time: 14:36 Had a fall or experienced change in No Accompanied By: self activities of daily living that may affect Transfer Assistance: None risk of falls: Patient Identification Verified: Yes Signs or symptoms of abuse/neglect since last visito No Secondary Verification Process Completed: Yes Hospitalized since last visit: No Patient Requires Transmission-Based Precautions: No Implantable device outside of the clinic excluding No Patient Has Alerts: No cellular tissue based products placed in the center since last visit: Has Dressing in Place as Prescribed: Yes Pain Present Now: No Electronic Signature(s) Signed: 07/15/2020 5:02:46 PM By: Sandre Kitty Entered By: Sandre Kitty on 07/15/2020 14:36:32 -------------------------------------------------------------------------------- Encounter Discharge Information Details Patient Name: Date of Service: [name], RA NDO [facility] 07/15/2020 2:00 PM Medical Record Number: [id number] Patient Account Number: [id number] Date of Birth/Sex: Treating RN: 18-Aug-1970 (50 y.o. [name] Primary Care Sahith Nurse: Stoney Bang Other Clinician: Referring Keyauna Graefe: Treating Hetty Linhart/Extender: Dallas Breeding in Treatment: 10 Encounter Discharge Information Items Post Procedure Vitals Discharge  Condition: Stable Temperature (F): 98.4 Ambulatory Status: Ambulatory Pulse (bpm): 69 Discharge Destination: Home Respiratory Rate (breaths/min): 18 Transportation: Ambulance Blood Pressure (mmHg): 99/67 Accompanied By: alone Schedule Follow-up Appointment: No Clinical Summary of Care: Patient Declined Electronic Signature(s) Signed: 07/21/2020 6:17:43 PM By: Leane Call Entered By: Leane Call on 07/15/2020 16:58:36 -------------------------------------------------------------------------------- Lower Extremity Assessment Details Patient Name: Date of Service: [name], RA NDO [facility] 07/15/2020 2:00 PM Medical Record Number: [id number] Patient Account Number: [id number] Date of Birth/Sex: Treating RN: May 26, 1970 (50 y.o. [name] Primary Care Robinette Esters: Stoney Bang Other Clinician: Referring Alexandria Shiflett: Treating Lilyann Gravelle/Extender: Matt Holmes Weeks in Treatment: 10 Edema Assessment Assessed: Shirlyn Goltz: No] Patrice Paradise: Yes] Edema: [Left: N] [Right: o] Calf Left: Right: Point of Measurement: 34 cm From Medial Instep 41 cm Ankle Left: Right: Point of Measurement: 13 cm From Medial Instep 24 cm Vascular Assessment Pulses: Dorsalis Pedis Palpable: [Right:Yes] Electronic Signature(s) Signed: 07/15/2020 5:38:09 PM By: Lorrin Jackson Entered By: Lorrin Jackson on 07/15/2020 14:43:33 -------------------------------------------------------------------------------- Multi-Disciplinary Care Plan Details Patient Name: Date of Service: [name], RA NDO [facility] 07/15/2020 2:00 PM Medical Record Number: [id number] Patient Account Number: [id number] Date of Birth/Sex: Treating RN: 04-12-1970 (51 y.o. [name] Primary Care Dalyce Renne: Stoney Bang Other Clinician: Referring Lynzie Cliburn: Treating Caroline Longie/Extender: [place] Breeding in Treatment: [place] reviewed with physician Active  Inactive Nutrition Nursing Diagnoses: Impaired glucose control: actual or potential Potential for alteratiion in Nutrition/Potential for imbalanced nutrition Goals: Patient/caregiver will maintain therapeutic glucose control Date Initiated: 05/13/2020 Target Resolution Date: 08/05/2020 Goal Status: Active Interventions: Assess patient nutrition upon admission and as needed per policy Treatment Activities: Patient referred to Primary Care Physician for further nutritional evaluation : 05/13/2020 Notes: Osteomyelitis Nursing Diagnoses: Infection: osteomyelitis Knowledge deficit related to disease process and management Goals: Patient's osteomyelitis will resolve Date Initiated: 05/13/2020 Target Resolution Date: 08/05/2020 Goal Status: Active Interventions: Assess for signs and symptoms of osteomyelitis resolution every visit Provide education on osteomyelitis  Treatment Activities: Systemic antibiotics : 05/13/2020 T ordered outside of clinic : 05/13/2020 est Notes: Wound/Skin Impairment Nursing Diagnoses: Impaired tissue integrity Knowledge deficit related to ulceration/compromised skin integrity Goals: Patient/caregiver will verbalize understanding of skin care regimen Date Initiated: 05/13/2020 Target Resolution Date: 08/05/2020 Goal Status: Active Ulcer/skin breakdown will have a volume reduction of 30% by week 4 Date Initiated: 05/13/2020 Date Inactivated: 06/10/2020 Target Resolution Date: 06/03/2020 Goal Status: Met Ulcer/skin breakdown will have a volume reduction of 50% by week 8 Date Initiated: 06/10/2020 Date Inactivated: 07/08/2020 Target Resolution Date: 07/08/2020 Goal Status: Met Ulcer/skin breakdown will have a volume reduction of 80% by week 12 Date Initiated: 07/08/2020 Target Resolution Date: 08/05/2020 Goal Status: Active Interventions: Assess patient/caregiver ability to obtain necessary supplies Assess patient/caregiver ability to perform ulcer/skin care  regimen upon admission and as needed Assess ulceration(s) every visit Provide education on ulcer and skin care Treatment Activities: Skin care regimen initiated : 05/13/2020 Topical wound management initiated : 05/13/2020 Notes: Electronic Signature(s) Signed: 07/15/2020 5:41:46 PM By: Baruch Gouty RN, BSN Entered By: Baruch Gouty on 07/15/2020 14:50:53 -------------------------------------------------------------------------------- Pain Assessment Details Patient Name: Date of Service: [name], RA NDO [facility] 07/15/2020 2:00 PM Medical Record Number: [id number] Patient Account Number: [id number] Date of Birth/Sex: Treating RN: 1970/01/25 (50 y.o. [name] Primary Care Elizabet Schweppe: Stoney Bang Other Clinician: Referring Coulson Wehner: Treating Minela Bridgewater/Extender: Dallas Breeding in Treatment: 10 Active Problems Location of Pain Severity and Description of Pain Patient Has Paino No Site Locations Pain Management and Medication Current Pain Management: Electronic Signature(s) Signed: 07/15/2020 5:02:46 PM By: Sandre Kitty Signed: 07/15/2020 5:41:46 PM By: Baruch Gouty RN, BSN Entered By: Sandre Kitty on 07/15/2020 14:37:01 -------------------------------------------------------------------------------- Patient/Caregiver Education Details Patient Name: Date of Service: [name], [name] 6/29/2022andnbsp2:00 PM Medical Record Number: [id number] Patient Account Number: [id number] Date of Birth/Gender: Treating RN: June 19, 1970 (50 y.o. [name] Primary Care Physician: Stoney Bang Other Clinician: Referring Physician: Treating Physician/Extender: Dallas Breeding in Treatment: 10 Education Assessment Education Provided To: Patient Education Topics Provided Wound/Skin Impairment: Methods: Explain/Verbal Responses: Reinforcements needed, State content correctly Electronic Signature(s) Signed:  07/15/2020 5:41:46 PM By: Baruch Gouty RN, BSN Entered By: Baruch Gouty on 07/15/2020 14:51:10 -------------------------------------------------------------------------------- Wound Assessment Details Patient Name: Date of Service: [name], RA NDO [facility] 07/15/2020 2:00 PM Medical Record Number: [id number] Patient Account Number: [id number] Date of Birth/Sex: Treating RN: 27-Jul-1970 (50 y.o. [name] Primary Care Princeston Blizzard: Stoney Bang Other Clinician: Referring Bailea Beed: Treating Kyllian Clingerman/Extender: Dallas Breeding in Treatment: 10 Wound Status Wound Number: 3 Primary Diabetic Wound/Ulcer of the Lower Extremity Etiology: Wound Location: Right, Medial Foot Wound Open Wounding Event: Gradually Appeared Status: Date Acquired: 07/18/2019 Comorbid Sleep Apnea, Congestive Heart Failure, Hypertension, Type II Weeks Of Treatment: 10 History: Diabetes, Neuropathy Clustered Wound: No Photos Wound Measurements Length: (cm) 0.1 Width: (cm) 0.1 Depth: (cm) 0.1 Area: (cm) 0.008 Volume: (cm) 0.001 % Reduction in Area: 99.8% % Reduction in Volume: 100% Epithelialization: Large (67-100%) Tunneling: No Undermining: No Wound Description Classification: Grade 3 Wound Margin: Thickened Exudate Amount: Small Exudate Type: Serosanguineous Exudate Color: red, brown Foul Odor After Cleansing: No Slough/Fibrino Yes Wound Bed Granulation Amount: Large (67-100%) Exposed Structure Granulation Quality: Red, Pink Fascia Exposed: No Necrotic Amount: None Present (0%) Fat Layer (Subcutaneous Tissue) Exposed: Yes Tendon Exposed: No Muscle Exposed: No Joint Exposed: No Bone Exposed: No Treatment Notes Wound #3 (Foot) Wound Laterality: Right, Medial Cleanser Peri-Wound Care Topical  Triamcinolone Discharge Instruction: Apply thin layer Triamcinolone to the wound bed Primary Dressing Hydrofera Blue Classic Foam, 4x4 in Discharge Instruction: Moisten  with saline prior to applying to wound,cut to fit inside wound edges Secondary Dressing Zetuvit Plus Silicone Border Dressing 5x5 (in/in) Discharge Instruction: Apply silicone border over primary dressing , or may use gauze and secure with rolled gauze Secured With Compression Wrap Compression Stockings Add-Ons Electronic Signature(s) Signed: 07/15/2020 5:02:46 PM By: Sandre Kitty Signed: 07/15/2020 5:41:46 PM By: Baruch Gouty RN, BSN Entered By: Sandre Kitty on 07/15/2020 15:54:50 -------------------------------------------------------------------------------- Vitals Details Patient Name: Date of Service: [name], RA NDO [facility] 07/15/2020 2:00 PM Medical Record Number: [id number] Patient Account Number: [id number] Date of Birth/Sex: Treating RN: [birth year] (50 y.o. [name] Primary Care Thurmon Mizell: Stoney Bang Other Clinician: Referring Elzy Tomasello: Treating Yehya Brendle/Extender: Dallas Breeding in Treatment: 10 Vital Signs Time Taken: 14:36 Temperature (F): 98.4 Height (in): 72 Pulse (bpm): 69 Weight (lbs): 304 Respiratory Rate (breaths/min): 18 Body Mass Index (BMI): 41.2 Blood Pressure (mmHg): 99/67 Reference Range: 80 - 120 mg / dl Electronic Signature(s) Signed: 07/15/2020 5:02:46 PM By: Sandre Kitty Entered By: Sandre Kitty on 07/15/2020 14:36:51

## 2020-07-22 ENCOUNTER — Other Ambulatory Visit: Payer: Self-pay

## 2020-07-22 ENCOUNTER — Encounter (HOSPITAL_BASED_OUTPATIENT_CLINIC_OR_DEPARTMENT_OTHER): Payer: Medicare Other | Attending: Physician Assistant | Admitting: Physician Assistant

## 2020-07-22 DIAGNOSIS — L97512 Non-pressure chronic ulcer of other part of right foot with fat layer exposed: Secondary | ICD-10-CM | POA: Insufficient documentation

## 2020-07-22 DIAGNOSIS — F17218 Nicotine dependence, cigarettes, with other nicotine-induced disorders: Secondary | ICD-10-CM | POA: Insufficient documentation

## 2020-07-22 DIAGNOSIS — E1142 Type 2 diabetes mellitus with diabetic polyneuropathy: Secondary | ICD-10-CM | POA: Diagnosis not present

## 2020-07-22 DIAGNOSIS — I5042 Chronic combined systolic (congestive) and diastolic (congestive) heart failure: Secondary | ICD-10-CM | POA: Insufficient documentation

## 2020-07-22 DIAGNOSIS — E1151 Type 2 diabetes mellitus with diabetic peripheral angiopathy without gangrene: Secondary | ICD-10-CM | POA: Diagnosis not present

## 2020-07-22 DIAGNOSIS — E11621 Type 2 diabetes mellitus with foot ulcer: Secondary | ICD-10-CM | POA: Diagnosis not present

## 2020-07-22 DIAGNOSIS — I110 Hypertensive heart disease with heart failure: Secondary | ICD-10-CM | POA: Diagnosis not present

## 2020-07-22 DIAGNOSIS — Z89411 Acquired absence of right great toe: Secondary | ICD-10-CM | POA: Insufficient documentation

## 2020-07-22 DIAGNOSIS — L97529 Non-pressure chronic ulcer of other part of left foot with unspecified severity: Secondary | ICD-10-CM | POA: Diagnosis not present

## 2020-07-22 NOTE — Progress Notes (Addendum)
ORY, ELTING ([id number]) Visit Report for 07/22/2020 Arrival Information Details Patient Name: Date of Service: [name], RA NDO [facility] 07/22/2020 3:00 PM Medical Record Number: [id number] Patient Account Number: [id number] Date of Birth/Sex: Treating RN: [birth year] (50 y.o. [name] Primary Care Shreya Lacasse: [name] Other Clinician: Referring Griff Badley: Treating Damont Balles/Extender: [name] in Treatment: 11 Visit Information History Since Last Visit Added or deleted any medications: No Patient Arrived: Cane Any new allergies or adverse reactions: No Arrival Time: 14:55 Had a fall or experienced change in No Transfer Assistance: None activities of daily living that may affect Patient Identification Verified: Yes risk of falls: Secondary Verification Process Completed: Yes Signs or symptoms of abuse/neglect since last visito No Patient Requires Transmission-Based Precautions: No Hospitalized since last visit: No Patient Has Alerts: No Implantable device outside of the clinic excluding No cellular tissue based products placed in the center since last visit: Has Dressing in Place as Prescribed: Yes Has Footwear/Offloading in Place as Prescribed: Yes Right: Wedge Shoe Pain Present Now: No Electronic Signature(s) Signed: 07/22/2020 5:24:26 PM By: Antonieta Iba Entered By: Antonieta Iba on 07/22/2020 14:57:31 -------------------------------------------------------------------------------- Encounter Discharge Information Details Patient Name: Date of Service: [name], RA NDO [facility] 07/22/2020 3:00 PM Medical Record Number: [id number] Patient Account Number: [id number] Date of Birth/Sex: Treating RN: [birth year] (50 y.o. [name] Primary Care Srinidhi Landers: [name] Other Clinician: Referring Tejuan Gholson: Treating Miangel Flom/Extender: [name] in Treatment: 11 Encounter Discharge Information Items Post  Procedure Vitals Discharge Condition: Stable Temperature (F): 98.3 Ambulatory Status: Ambulatory Pulse (bpm): 88 Discharge Destination: Home Respiratory Rate (breaths/min): 18 Transportation: Private Auto Blood Pressure (mmHg): 121/81 Accompanied By: self Schedule Follow-up Appointment: Yes Clinical Summary of Care: Patient Declined Electronic Signature(s) Signed: 07/22/2020 6:17:15 PM By: [name] RN, BSN Entered By: [name] on 07/22/2020 16:47:12 -------------------------------------------------------------------------------- Lower Extremity Assessment Details Patient Name: Date of Service: [name], RA NDO [facility] 07/22/2020 3:00 PM Medical Record Number: [id number] Patient Account Number: [id number] Date of Birth/Sex: Treating RN: 09-Jan-1971 (50 y.o. [name] Primary Care Aloni Chuang: [name] Other Clinician: Referring Prachi Oftedahl: Treating Julyana Woolverton/Extender: [name] in Treatment: 11 Edema Assessment Assessed: [name]: No] [name]: Yes] Edema: [Left: N] [Right: o] Calf Left: Right: Point of Measurement: 34 cm From Medial Instep 42 cm Ankle Left: Right: Point of Measurement: 13 cm From Medial Instep 24 cm Vascular Assessment Pulses: Dorsalis Pedis Palpable: [Right:Yes] Electronic Signature(s) Signed: 07/22/2020 5:24:26 PM By: Antonieta Iba Entered By: Antonieta Iba on 07/22/2020 15:05:50 -------------------------------------------------------------------------------- Multi-Disciplinary Care Plan Details Patient Name: Date of Service: [name], RA NDO [facility] 07/22/2020 3:00 PM Medical Record Number: [id number] Patient Account Number: [id number] Date of Birth/Sex: Treating RN: 1970-08-28 (50 y.o. [name] Primary Care Ruben Mahler: [name] Other Clinician: Referring Kandi Brusseau: Treating Arville Postlewaite/Extender: [name] in Treatment: 11 Multidisciplinary Care Plan reviewed with  physician Active Inactive Electronic Signature(s) Signed: 08/07/2020 2:02:21 PM By: [name] RN, BSN Previous Signature: 07/22/2020 6:17:15 PM Version By: [name] RN, BSN Entered By: [name] on 08/03/2020 17:25:44 -------------------------------------------------------------------------------- Pain Assessment Details Patient Name: Date of Service: [name], RA NDO [facility] 07/22/2020 3:00 PM Medical Record Number: [id number] Patient Account Number: [id number] Date of Birth/Sex: Treating RN: 11-Dec-1970 (50 y.o. [name] Primary Care Remmi Armenteros: [name] Other Clinician: Referring Gwendolynn Merkey: Treating Barre Aydelott/Extender: [name] in Treatment: 11 Active Problems Location of Pain Severity and  Description of Pain Patient Has Paino No Site Locations Pain Management and Medication Current Pain Management: Electronic Signature(s) Signed: 07/22/2020 5:24:26 PM By: Antonieta Iba Entered By: Antonieta Iba on 07/22/2020 15:00:33 -------------------------------------------------------------------------------- Patient/Caregiver Education Details Patient Name: Date of Service: [name], [name] 7/6/2022andnbsp3:00 PM Medical Record Number: [id number] Patient Account Number: [id number] Date of Birth/Gender: Treating RN: 1970-03-11 (50 y.o. [name] Primary Care Physician: [name] Other Clinician: Referring Physician: Treating Physician/Extender: [name] in Treatment: 11 Education Assessment Education Provided To: Patient Education Topics Provided Wound/Skin Impairment: Methods: Explain/Verbal Responses: Reinforcements needed, State content correctly [employer]) Signed: 07/22/2020 6:17:15 PM By: [name] RN, BSN Entered By: [name] on 07/22/2020 15:32:30 -------------------------------------------------------------------------------- Wound Assessment  Details Patient Name: Date of Service: [name], RA NDO [facility] 07/22/2020 3:00 PM Medical Record Number: [id number] Patient Account Number: [id number] Date of Birth/Sex: Treating RN: 01-Apr-1970 (50 y.o. [name] Primary Care Chenise Mulvihill: [name] Other Clinician: Referring Ricardo Schubach: Treating Norissa Bartee/Extender: [name] in Treatment: 11 Wound Status Wound Number: 3 Primary Diabetic Wound/Ulcer of the Lower Extremity Etiology: Wound Location: Right, Medial Foot Wound Open Wounding Event: Gradually Appeared Status: Date Acquired: 07/18/2019 Comorbid Sleep Apnea, Congestive Heart Failure, Hypertension, Type II Weeks Of Treatment: 11 History: Diabetes, Neuropathy Clustered Wound: No Photos Wound Measurements Length: (cm) 0.2 Width: (cm) 0.2 Depth: (cm) 0.2 Area: (cm) 0.031 Volume: (cm) 0.006 % Reduction in Area: 99% % Reduction in Volume: 99.8% Epithelialization: Large (67-100%) Tunneling: No Undermining: No Wound Description Classification: Grade 3 Wound Margin: Distinct, outline attached Exudate Amount: Medium Exudate Type: Serosanguineous Exudate Color: red, brown Foul Odor After Cleansing: No Slough/Fibrino No Wound Bed Granulation Amount: Large (67-100%) Exposed Structure Granulation Quality: Red, Pink Fascia Exposed: No Necrotic Amount: None Present (0%) Fat Layer (Subcutaneous Tissue) Exposed: Yes Tendon Exposed: No Muscle Exposed: No Joint Exposed: No Bone Exposed: No Electronic Signature(s) Signed: 07/22/2020 5:22:20 PM By: Karl Ito Signed: 07/22/2020 5:24:26 PM By: Antonieta Iba Entered By: Karl Ito on 07/22/2020 17:21:26 -------------------------------------------------------------------------------- Vitals Details Patient Name: Date of Service: [name], RA NDO [facility] 07/22/2020 3:00 PM Medical Record Number: [id number] Patient Account Number: [id number] Date of Birth/Sex: Treating RN: 10-27-1970  (50 y.o. [name] Primary Care Sachit Gilman: [name] Other Clinician: Referring Anayia Eugene: Treating Delsy Etzkorn/Extender: [name] in Treatment: 11 Vital Signs Time Taken: 14:57 Temperature (F): 98.3 Height (in): 72 Pulse (bpm): 88 Weight (lbs): 304 Respiratory Rate (breaths/min): 18 Body Mass Index (BMI): 41.2 Blood Pressure (mmHg): 121/81 Reference Range: 80 - 120 mg / dl Electronic Signature(s) Signed: 07/22/2020 5:24:26 PM By: Antonieta Iba Entered By: Antonieta Iba on 07/22/2020 15:00:19

## 2020-07-22 NOTE — Progress Notes (Addendum)
[name], [name] ([id number]) Visit Report for 07/22/2020 Chief Complaint Document Details Patient Name: Date of Service: [name], RA NDO [facility] 07/22/2020 3:00 PM Medical Record Number: [id number] Patient Account Number: [id number] Date of Birth/Sex: Treating RN: 10/27/1970 (50 y.o. [name] Primary Care Provider: Lia [name] Other Clinician: Referring Provider: Treating Provider/Extender: [name] in Treatment: 11 Information Obtained from: Patient Chief Complaint Right foot ulcer Electronic Signature(s) Signed: 07/22/2020 3:04:04 PM By: Jose Kelp PA-C Entered By: [name] on 07/22/2020 15:04:04 -------------------------------------------------------------------------------- Debridement Details Patient Name: Date of Service: [name], RA NDO [facility] 07/22/2020 3:00 PM Medical Record Number: [id number] Patient Account Number: [id number] Date of Birth/Sex: Treating RN: 1970-11-16 (50 y.o. [name] Primary Care Provider: Lia [name] Other Clinician: Referring Provider: Treating Provider/Extender: [name] in Treatment: 11 Debridement Performed for Assessment: Wound #3 Right,Medial Foot Performed By: Physician Jose Kelp, PA Debridement Type: Debridement Severity of Tissue Pre Debridement: Fat layer exposed Level of Consciousness (Pre-procedure): Awake and Alert Pre-procedure Verification/Time Out Yes - 15:25 Taken: Start Time: 15:27 Pain Control: Other : benzocaine 20% spray T Area Debrided (L x W): otal 2 (cm) x 1.5 (cm) = 3 (cm) Tissue and other material debrided: Viable, Non-Viable, Callus, Subcutaneous, Skin: Epidermis Level: Skin/Subcutaneous Tissue Debridement Description: Excisional Instrument: Curette Bleeding: Minimum Hemostasis Achieved: Pressure End Time: 15:37 Procedural Pain: 0 Post Procedural Pain: 0 Response to Treatment: Procedure was tolerated well Level  of Consciousness (Post- Awake and Alert procedure): Post Debridement Measurements of Total Wound Length: (cm) 2 Width: (cm) 1.5 Depth: (cm) 0.3 Volume: (cm) 0.707 Character of Wound/Ulcer Post Debridement: Improved Severity of Tissue Post Debridement: Fat layer exposed Post Procedure Diagnosis Same as Pre-procedure Electronic Signature(s) Signed: 07/22/2020 4:43:01 PM By: Jose Kelp PA-C Signed: 07/22/2020 6:17:15 PM By: Jose Deed RN, BSN Entered By: [name] on 07/22/2020 15:38:05 -------------------------------------------------------------------------------- HPI Details Patient Name: Date of Service: [name], RA NDO [facility] 07/22/2020 3:00 PM Medical Record Number: [id number] Patient Account Number: [id number] Date of Birth/Sex: Treating RN: 1970-07-18 (50 y.o. [name] Primary Care Provider: Lia [name] Other Clinician: Referring Provider: Treating Provider/Extender: [name] in Treatment: 11 History of Present Illness HPI Description: ADMISSION 05/13/2019 This is a 50 year old man who has type 2 diabetes with peripheral neuropathy. He has a history of wounds on the plantar aspect of his left first and fourth toes. He has had these for several months. He was being seen in the wound care center at [facility] in [facility] He apparently presented with swelling and an open wound at the tip of the toe. An MRI showed osteomyelitis. He was given 6 to 8 weeks of oral doxycycline again all of this via the patient we have none of these records. Since then he has been putting Goldbond on the areas wearing regular shoes. He was seen by his primary physician on 4/9 and sent down here for our review. In the primary care note it says he has been recommended for hyperbaric oxygen. Past medical history includes type 2 diabetes with peripheral neuropathy, heart failure with reduced ejection fraction 30 to 35%, peripheral  arterial disease, coronary artery disease, hyperlipidemia, hypertension, syncope and a prior history of a right great toe amputation also by Dr. Lynden [name] in [place] ABIs in our clinic were 0.91 on the left. Also notable that in 2019 he appears to have had arterial studies that are visible  in our system. At that point the right ABI was 1.17, TBI of 0.85 with triphasic waveforms. On the left his ABI was 1.19 with a TBI of 0.73 again with triphasic waveform 5/3; we did receive some information from [facility] wound care. The patient was seen there on 1/29. At that point he had wounds on the right foot at the amputation site the fifth digit fourth digit I am presuming on the right and then an area on the left first digit although that is not specifically stated, he has had a previous amputation on the right however. It would appear him at that time most of his ulcers were on the right the left foot is stated to have a lot of scales and calluses but not a lot of open wounds. The only wounds we define when he came in here last time were on the left first and left fourth toe He also had a wound culture that showed heavy growth of staph aureus and a slight growth of Stenotrophomonas maltophilia. The doxycycline should cover the staph aureus I am not sure about the stenotrophomonas. In any case he completed 6 weeks of this. UNFORTUNATELY I still do not have a copy of the MRI. And the exact justification for hyperbarics is therefore lacking. 5/10; the patient had osteomyelitis in the left fourth toe. He was treated for 6 weeks of doxycycline this wound is healed as is the left first toe. He was sent here for hyperbaric oxygen however at this point his wounds are healed and I think a course of watchful waiting and observation is in order. If the left fourth toe reopens then it is likely he will need a more protracted and aggressive treatment approach Readmission: 05/06/20 upon evaluation today patient presents for  readmission here in the clinic exam issues with his right foot on the medial aspect at the amputation site where his great toe was this is the first metatarsal area that is affected. He has been seeing Dr. Marcha Solders who performed the surgery. With that being said currently the patient was diagnosed as having MRSA on a culture that was + March 31. Has been on IV vancomycin for 6 weeks he is now on doxycycline as the MRSA was sensitive to Doxy and he does have 2 more refills he has been on that for 3 weeks already. He has MRI of January for showed L knee first metatarsal base early osteomyelitis versus possible reactive disease but nonetheless based on what was seen it appears osteomyelitis is more likely the cause here. He has had Apligraf although to be honest that did not seem to do the job. He is also been using Dakin's solution and currently is using Aquacel. Sharp debridement has been performed by Dr. Marcha Solders on a weekly basis according to what the patient tells me. With all that being said the patient was referred to [place] for further evaluation and treatment as unfortunately he does not seem to be responding well to his treatment with the surgeon. They have considered a total contact cast it sounds like but again with the infection that this was not a good idea. The patient does have a significant past medical history for diabetes mellitus type 2 for which she is on insulin, hypertension, significant congestive heart failure with a most recent ejection fraction of 30 to 35% and that was in 2020. He has not seen his cardiologist [name] since that time. In regard to his diabetes his A1c he does not  even know he tells me his blood sugars run somewhere in the 200-300 range. With that being said he has not seen the primary care provider recently for management of this either. The patient tells me he is also a current smoker. He tells me he is trying to stop using nicotine pouches but he just does not have  enough saliva he tells me his mouth is dry all the time this is probably a subsequent issue from the diabetes as well. Unfortunately he just appears to be in a place where he is not necessarily at his healthiest even with his medical conditions. I think he would be a candidate for hyperbaric oxygen therapy but there are a lot of other things that need to be in place first before we get to that point. 05/13/2020 patient presents for 1 week follow-up. He has been using Hydrofera Blue every other day without any issues. He had to reschedule his cardiologist appointment due to transportation Issues. He has no complaints today. 05/20/2020 upon evaluation today patient appears to be doing well with regard to his foot ulcer all things considered. With that being said he tells me that he has been tolerating the dressing changes without complication. There does not appear to be any signs of active infection which is great news and overall very pleased with where things stand today. No fevers, chills, nausea, vomiting, or diarrhea. 05/27/2020 upon evaluation today patient appears to be doing well with regard to his wound on the foot. He does not require little bit of debridement today but overall he seems to be doing quite well. He actually has his echo on Monday we will see what that shows as well were hoping to be able to get him into the hyperbaric oxygen chamber but again a lot depends on what this test shows he will also need a chest x-ray if this turns out okay before going in the chamber. 06/03/2020 upon evaluation today patient's wound actually showing signs of minimal improvement. Fortunately I think that he is continuing to make great progress all things considered. He does have his echocardiogram next week. That is good news so we can see whether or not hyperbaric oxygen therapy would be appropriate for him I am hopeful we will be able to get him in the chamber sooner rather than later but again it all  depends on his ejection fraction. Right now we have been somewhat limited in being able to proceed with that as to be perfectly honest we just have not gotten the needed testing in order to go forward with the hyperbarics. If his ejection fraction is too low which could be the case then obviously is not good to be able to 5/25; patient presents for 1 week follow-up. He states he has his echo set up for next week. He overall feels well. He denies any issues over the past week. He denies signs of infection. 06/17/2020 upon evaluation today patient appears to be doing about the same in regard to his foot ulcer is measuring a little bit smaller today but still keeps developing the spongy tissue. I really think it may be a good idea for [place] to obtain a sample of a punch biopsy to send for evaluation. He is in agreement with this plan. Subsequently he did have his echocardiogram which showed that he has a systolic left ventricular function which is moderately decreased estimated to be 30 to 35%. He does have a follow-up appointment with the cardiologist on Monday to  see what they think about this. I advised that he needs to talk to them as well that what they feel about him potentially going into hyperbaric oxygen therapy if his heart would be sufficient for this or not. Obviously if they feel that it is something that would be sufficient then we need to have this in writing. Otherwise patient tells me that he has been doing fairly well is not really having any significant pain. 06/24/2020 upon evaluation today patient appears to be doing decently well in regard to his wound. In fact he does not have as much of the tissue that grew back this time. I do see that he had the results back from his skin biopsy. This showed reactive squamous hyperplasia. This again seems to be somewhat similar to lichen planus and how that would function. With that being said there was no malignancy and potentially this means it could  be a result and a response to like a topical steroid like triamcinolone. I believe this may at least be something to get a shot at this point. The patient is in agreement with the plan. I am very pleased though there is no malignancy noted. We also got a letter back from his cardiologist stating that he is from a cardiac perspective able to participate in hyperbaric oxygen therapy. Therefore if need be I think we can proceed as such. For that reason I am going to send in for the chest x-ray in order to ensure that we have what we need in place if we do need to proceed down that road. 07/01/2020 upon evaluation today patient appears to be doing decently well in regard to his wound. He has been tolerating the dressing changes without complication. Fortunately there does not appear to be any signs of active infection which is great news. No fever chills noted. He does note that he had a little bit of increased pain over the past week this seems to be kind of a neuropathy type issue to be honest. It does seem to be coming from his wound and I do not think it is coming from anywhere else in his foot although he does note some referred pain that seems to be traveling a little bit on him. Overall I think that in general this is just more of a bit of a mind game in that regard not that he is not hurting but rather where he is experiencing the pain at. Either way I do believe that the wound is doing much better as compared to what it was previous. 07/08/2020 upon evaluation today patient's wound actually appears to be doing excellent. He is making great progress and the triamcinolone has done excellent for him. There does not appear to be any signs of active infection which is great news. No fevers, chills, nausea, vomiting, or diarrhea. 07/16/2018 upon evaluation today patient appears to be doing well currently in regard to his wound. In fact this is getting very close to complete resolution. I am extremely  pleased with where we see things standing today. There does not appear to be any evidence of active infection which is great and overall happy in that regard as well. Fortunately the patient does not seem to be making any backslide as far as the wound is concerned and overall I really think that he is making excellent progress to be honest. No fevers, chills, nausea, vomiting, or diarrhea. 07/22/2020 upon evaluation today patient appears to be doing well with regard to his  wound on initial inspection however he actually had some area of purulence noted up underneath the apparently healed area. This appears to have callused over and then subsequently started draining. This is going require quite a bit of debridement today unfortunately. This is a little bit of a setback but hopefully not too much. Electronic Signature(s) Signed: 07/22/2020 3:48:50 PM By: Jose Kelp PA-C Entered By: [name] on 07/22/2020 15:48:49 -------------------------------------------------------------------------------- Physical Exam Details Patient Name: Date of Service: [name], RA NDO [facility] 07/22/2020 3:00 PM Medical Record Number: [id number] Patient Account Number: [id number] Date of Birth/Sex: Treating RN: [birth year] (50 y.o. [name] Primary Care Provider: Lia [name] Other Clinician: Referring Provider: Treating Provider/Extender: [name] in Treatment: 11 Constitutional Well-nourished and well-hydrated in no acute distress. Respiratory normal breathing without difficulty. Psychiatric this patient is able to make decisions and demonstrates good insight into disease process. Alert and Oriented x 3. pleasant and cooperative. Notes Patient's wound bed showed signs again of initially looking to be almost healed although with the drainage underneath and the fluctuance I did debride away the tissue that was not healthy and postdebridement the wound bed appears to be  doing much better which is great news. No fevers, chills, nausea, vomiting, or diarrhea. I do not think we need to do a culture right now although we will keep things under close monitoring. Electronic Signature(s) Signed: 07/22/2020 3:49:45 PM By: Jose Kelp PA-C Entered By: [name] on 07/22/2020 15:49:45 -------------------------------------------------------------------------------- Physician Orders Details Patient Name: Date of Service: [name], RA NDO [facility] 07/22/2020 3:00 PM Medical Record Number: [id number] Patient Account Number: [id number] Date of Birth/Sex: Treating RN: 30-Jan-1970 (50 y.o. [name] Primary Care Provider: Lia [name] Other Clinician: Referring Provider: Treating Provider/Extender: [name] in Treatment: 55 Verbal / Phone Orders: No Diagnosis Coding ICD-10 Coding Code Description E11.621 Type 2 diabetes mellitus with foot ulcer L97.512 Non-pressure chronic ulcer of other part of right foot with fat layer exposed E11.42 Type 2 diabetes mellitus with diabetic polyneuropathy I10 Essential (primary) hypertension I50.42 Chronic combined systolic (congestive) and diastolic (congestive) heart failure F17.218 Nicotine dependence, cigarettes, with other nicotine-induced disorders Follow-up Appointments ppointment in 1 week. - with Leonard Schwartz Return A Bathing/ Shower/ Hygiene May shower with protection but do not get wound dressing(s) wet. Off-Loading Wedge shoe to: - right foot to ambulate Additional Orders / Instructions Stop/Decrease Smoking Follow Nutritious Diet Home Health No change in wound care orders this week; continue Home Health for wound care. May utilize formulary equivalent dressing for wound treatment orders unless otherwise specified. Other Home Health Orders/Instructions: - [facility] Wound Treatment Wound #3 - Foot Wound Laterality: Right, Medial Topical: Triamcinolone 1 x Per Day/30  Days Discharge Instructions: Apply thin layer Triamcinolone to the wound bed Prim Dressing: Hydrofera Blue Classic Foam, 4x4 in 1 x Per Day/30 Days ary Discharge Instructions: Moisten with saline prior to applying to wound,cut to fit inside wound edges Secondary Dressing: Zetuvit Plus Silicone Border Dressing 5x5 (in/in) 1 x Per Day/30 Days Discharge Instructions: Apply silicone border over primary dressing , or may use gauze and secure with rolled gauze Electronic Signature(s) Signed: 07/22/2020 4:43:01 PM By: Jose Kelp PA-C Signed: 07/22/2020 6:17:15 PM By: Jose Deed RN, BSN Entered By: [name] on 07/22/2020 15:37:20 -------------------------------------------------------------------------------- Problem List Details Patient Name: Date of Service: [name], RA NDO [facility] 07/22/2020 3:00 PM Medical Record Number: [id number] Patient Account Number: [id number] Date  of Birth/Sex: Treating RN: 07-09-1970 (50 y.o. Bayard Hugger, Bonita Quin Primary Care Provider: Lia [name] Other Clinician: Referring Provider: Treating Provider/Extender: [name] in Treatment: 11 Active Problems ICD-10 Encounter Code Description Active Date MDM Diagnosis E11.621 Type 2 diabetes mellitus with foot ulcer 05/06/2020 No Yes L97.512 Non-pressure chronic ulcer of other part of right foot with fat layer exposed 05/06/2020 No Yes E11.42 Type 2 diabetes mellitus with diabetic polyneuropathy 05/06/2020 No Yes I10 Essential (primary) hypertension 05/06/2020 No Yes I50.42 Chronic combined systolic (congestive) and diastolic (congestive) heart failure 05/06/2020 No Yes F17.218 Nicotine dependence, cigarettes, with other nicotine-induced disorders 05/06/2020 No Yes Inactive Problems Resolved Problems Electronic Signature(s) Signed: 07/22/2020 3:03:58 PM By: Jose Kelp PA-C Entered By: [name] on 07/22/2020  15:03:57 -------------------------------------------------------------------------------- Progress Note Details Patient Name: Date of Service: [name], RA NDO [facility] 07/22/2020 3:00 PM Medical Record Number: [id number] Patient Account Number: [id number] Date of Birth/Sex: Treating RN: January 05, 1971 (49 y.o. [name] Primary Care Provider: Lia [name] Other Clinician: Referring Provider: Treating Provider/Extender: [name] in Treatment: 11 Subjective Chief Complaint Information obtained from Patient Right foot ulcer History of Present Illness (HPI) ADMISSION 05/13/2019 This is a 50 year old man who has type 2 diabetes with peripheral neuropathy. He has a history of wounds on the plantar aspect of his left first and fourth toes. He has had these for several months. He was being seen in the wound care center at [facility] in [facility]. He apparently presented with swelling and an open wound at the tip of the toe. An MRI showed osteomyelitis. He was given 6 to 8 weeks of oral doxycycline again all of this via the patient we have none of these records. Since then he has been putting Goldbond on the areas wearing regular shoes. He was seen by his primary physician on 4/9 and sent down here for our review. In the primary care note it says he has been recommended for hyperbaric oxygen. Past medical history includes type 2 diabetes with peripheral neuropathy, heart failure with reduced ejection fraction 30 to 35%, peripheral arterial disease, coronary artery disease, hyperlipidemia, hypertension, syncope and a prior history of a right great toe amputation also by Dr. Lynden [name] in [place] ABIs in our clinic were 0.91 on the left. Also notable that in 2019 he appears to have had arterial studies that are visible in our system. At that point the right ABI was 1.17, TBI of 0.85 with triphasic waveforms. On the left his ABI was 1.19 with a TBI of 0.73  again with triphasic waveform 5/3; we did receive some information from [facility] wound care. The patient was seen there on 1/29. At that point he had wounds on the right foot at the amputation site the fifth digit fourth digit I am presuming on the right and then an area on the left first digit although that is not specifically stated, he has had a previous amputation on the right however. It would appear him at that time most of his ulcers were on the right the left foot is stated to have a lot of scales and calluses but not a lot of open wounds. The only wounds we define when he came in here last time were on the left first and left fourth toe He also had a wound culture that showed heavy growth of staph aureus and a slight growth of Stenotrophomonas maltophilia. The doxycycline should cover the staph aureus I  am not sure about the stenotrophomonas. In any case he completed 6 weeks of this. UNFORTUNATELY I still do not have a copy of the MRI. And the exact justification for hyperbarics is therefore lacking. 5/10; the patient had osteomyelitis in the left fourth toe. He was treated for 6 weeks of doxycycline this wound is healed as is the left first toe. He was sent here for hyperbaric oxygen however at this point his wounds are healed and I think a course of watchful waiting and observation is in order. If the left fourth toe reopens then it is likely he will need a more protracted and aggressive treatment approach Readmission: 05/06/20 upon evaluation today patient presents for readmission here in the clinic exam issues with his right foot on the medial aspect at the amputation site where his great toe was this is the first metatarsal area that is affected. He has been seeing Dr. Marcha Soldersathey who performed the surgery. With that being said currently the patient was diagnosed as having MRSA on a culture that was + March 31. Has been on IV vancomycin for 6 weeks he is now on doxycycline as the MRSA  was sensitive to Doxy and he does have 2 more refills he has been on that for 3 weeks already. He has MRI of January for showed L knee first metatarsal base early osteomyelitis versus possible reactive disease but nonetheless based on what was seen it appears osteomyelitis is more likely the cause here. He has had Apligraf although to be honest that did not seem to do the job. He is also been using Dakin's solution and currently is using Aquacel. Sharp debridement has been performed by Dr. Marcha Soldersathey on a weekly basis according to what the patient tells me. With all that being said the patient was referred to us for further evaluation and treatment as unfortunately he does not seem to be responding well to his treatment with the surgeon. They have considered a total contact cast it sounds like but again with the infection that this was not a good idea. The patient does have a significant past medical history for diabetes mellitus type 2 for which she is on insulin, hypertension, significant congestive heart failure with a most recent ejection fraction of 30 to 35% and that was in 2020. He has not seen his cardiologist Dr. Wyline MoodBranch since that time. In regard to his diabetes his A1c he does not even know he tells me his blood sugars run somewhere in the 200-300 range. With that being said he has not seen the primary care provider recently for management of this either. The patient tells me he is also a current smoker. He tells me he is trying to stop using nicotine pouches but he just does not have enough saliva he tells me his mouth is dry all the time this is probably a subsequent issue from the diabetes as well. Unfortunately he just appears to be in a place where he is not necessarily at his healthiest even with his medical conditions. I think he would be a candidate for hyperbaric oxygen therapy but there are a lot of other things that need to be in place first before we get to that point. 05/13/2020  patient presents for 1 week follow-up. He has been using Hydrofera Blue every other day without any issues. He had to reschedule his cardiologist appointment due to transportation Issues. He has no complaints today. 05/20/2020 upon evaluation today patient appears to be doing well with regard to  his foot ulcer all things considered. With that being said he tells me that he has been tolerating the dressing changes without complication. There does not appear to be any signs of active infection which is great news and overall very pleased with where things stand today. No fevers, chills, nausea, vomiting, or diarrhea. 05/27/2020 upon evaluation today patient appears to be doing well with regard to his wound on the foot. He does not require little bit of debridement today but overall he seems to be doing quite well. He actually has his echo on Monday we will see what that shows as well were hoping to be able to get him into the hyperbaric oxygen chamber but again a lot depends on what this test shows he will also need a chest x-ray if this turns out okay before going in the chamber. 06/03/2020 upon evaluation today patient's wound actually showing signs of minimal improvement. Fortunately I think that he is continuing to make great progress all things considered. He does have his echocardiogram next week. That is good news so we can see whether or not hyperbaric oxygen therapy would be appropriate for him I am hopeful we will be able to get him in the chamber sooner rather than later but again it all depends on his ejection fraction. Right now we have been somewhat limited in being able to proceed with that as to be perfectly honest we just have not gotten the needed testing in order to go forward with the hyperbarics. If his ejection fraction is too low which could be the case then obviously is not good to be able to 5/25; patient presents for 1 week follow-up. He states he has his echo set up for next week.  He overall feels well. He denies any issues over the past week. He denies signs of infection. 06/17/2020 upon evaluation today patient appears to be doing about the same in regard to his foot ulcer is measuring a little bit smaller today but still keeps developing the spongy tissue. I really think it may be a good idea for [place] to obtain a sample of a punch biopsy to send for evaluation. He is in agreement with this plan. Subsequently he did have his echocardiogram which showed that he has a systolic left ventricular function which is moderately decreased estimated to be 30 to 35%. He does have a follow-up appointment with the cardiologist on Monday to see what they think about this. I advised that he needs to talk to them as well that what they feel about him potentially going into hyperbaric oxygen therapy if his heart would be sufficient for this or not. Obviously if they feel that it is something that would be sufficient then we need to have this in writing. Otherwise patient tells me that he has been doing fairly well is not really having any significant pain. 06/24/2020 upon evaluation today patient appears to be doing decently well in regard to his wound. In fact he does not have as much of the tissue that grew back this time. I do see that he had the results back from his skin biopsy. This showed reactive squamous hyperplasia. This again seems to be somewhat similar to lichen planus and how that would function. With that being said there was no malignancy and potentially this means it could be a result and a response to like a topical steroid like triamcinolone. I believe this may at least be something to get a shot at this point. The  patient is in agreement with the plan. I am very pleased though there is no malignancy noted. We also got a letter back from his cardiologist stating that he is from a cardiac perspective able to participate in hyperbaric oxygen therapy. Therefore if need be I think we  can proceed as such. For that reason I am going to send in for the chest x-ray in order to ensure that we have what we need in place if we do need to proceed down that road. 07/01/2020 upon evaluation today patient appears to be doing decently well in regard to his wound. He has been tolerating the dressing changes without complication. Fortunately there does not appear to be any signs of active infection which is great news. No fever chills noted. He does note that he had a little bit of increased pain over the past week this seems to be kind of a neuropathy type issue to be honest. It does seem to be coming from his wound and I do not think it is coming from anywhere else in his foot although he does note some referred pain that seems to be traveling a little bit on him. Overall I think that in general this is just more of a bit of a mind game in that regard not that he is not hurting but rather where he is experiencing the pain at. Either way I do believe that the wound is doing much better as compared to what it was previous. 07/08/2020 upon evaluation today patient's wound actually appears to be doing excellent. He is making great progress and the triamcinolone has done excellent for him. There does not appear to be any signs of active infection which is great news. No fevers, chills, nausea, vomiting, or diarrhea. 07/16/2018 upon evaluation today patient appears to be doing well currently in regard to his wound. In fact this is getting very close to complete resolution. I am extremely pleased with where we see things standing today. There does not appear to be any evidence of active infection which is great and overall happy in that regard as well. Fortunately the patient does not seem to be making any backslide as far as the wound is concerned and overall I really think that he is making excellent progress to be honest. No fevers, chills, nausea, vomiting, or diarrhea. 07/22/2020 upon evaluation  today patient appears to be doing well with regard to his wound on initial inspection however he actually had some area of purulence noted up underneath the apparently healed area. This appears to have callused over and then subsequently started draining. This is going require quite a bit of debridement today unfortunately. This is a little bit of a setback but hopefully not too much. Objective Constitutional Well-nourished and well-hydrated in no acute distress. Vitals Time Taken: 2:57 PM, Height: 72 in, Weight: 304 lbs, BMI: 41.2, Temperature: 98.3 F, Pulse: 88 bpm, Respiratory Rate: 18 breaths/min, Blood Pressure: 121/81 mmHg. Respiratory normal breathing without difficulty. Psychiatric this patient is able to make decisions and demonstrates good insight into disease process. Alert and Oriented x 3. pleasant and cooperative. General Notes: Patient's wound bed showed signs again of initially looking to be almost healed although with the drainage underneath and the fluctuance I did debride away the tissue that was not healthy and postdebridement the wound bed appears to be doing much better which is great news. No fevers, chills, nausea, vomiting, or diarrhea. I do not think we need to do a culture right now  although we will keep things under close monitoring. Integumentary (Hair, Skin) Wound #3 status is Open. Original cause of wound was Gradually Appeared. The date acquired was: 07/18/2019. The wound has been in treatment 11 weeks. The wound is located on the Right,Medial Foot. The wound measures 0.2cm length x 0.2cm width x 0.2cm depth; 0.031cm^2 area and 0.006cm^3 volume. There is Fat Layer (Subcutaneous Tissue) exposed. There is no tunneling or undermining noted. There is a medium amount of serosanguineous drainage noted. The wound margin is distinct with the outline attached to the wound base. There is large (67-100%) red, pink granulation within the wound bed. There is no necrotic tissue  within the wound bed. 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 There was a Excisional Skin/Subcutaneous Tissue Debridement with a total area of 3 sq cm performed by Jose Kelp, PA. With the following instrument(s): Curette to remove Viable and Non-Viable tissue/material. Material removed includes Callus, Subcutaneous Tissue, and Skin: Epidermis after achieving pain control using Other (benzocaine 20% spray). No specimens were taken. A time out was conducted at 15:25, prior to the start of the procedure. A Minimum amount of bleeding was controlled with Pressure. The procedure was tolerated well with a pain level of 0 throughout and a pain level of 0 following the procedure. Post Debridement Measurements: 2cm length x 1.5cm width x 0.3cm depth; 0.707cm^3 volume. Character of Wound/Ulcer Post Debridement is improved. Severity of Tissue Post Debridement is: Fat layer exposed. Post procedure Diagnosis Wound #3: Same as Pre-Procedure Plan Follow-up Appointments: Return Appointment in 1 week. - with Luana Shu Shower/ Hygiene: May shower with protection but do not get wound dressing(s) wet. Off-Loading: Wedge shoe to: - right foot to ambulate Additional Orders / Instructions: Stop/Decrease Smoking Follow Nutritious Diet Home Health: No change in wound care orders this week; continue Home Health for wound care. May utilize formulary equivalent  dressing for wound treatment orders unless otherwise specified. Other Home Health Orders/Instructions: - [facility] WOUND #3: - Foot Wound Laterality: Right, Medial Topical: Triamcinolone 1 x Per Day/30 Days Discharge Instructions: Apply thin layer Triamcinolone to the wound bed Prim Dressing: Hydrofera Blue Classic Foam, 4x4 in 1 x Per Day/30 Days ary Discharge Instructions: Moisten with saline prior to applying to wound,cut to fit inside wound edges Secondary Dressing: Zetuvit Plus Silicone Border Dressing 5x5 (in/in) 1 x Per Day/30 Days Discharge Instructions: Apply silicone border over primary dressing , or may use gauze and secure with rolled gauze 1. Would recommend currently that we going continue with the wound care measures as before using the triamcinolone topically followed by the [facility] which I think is done a good job. 2. We will use the Zetuvit that silicone border dressing to cover. 3. I would recommend as well that the patient continue to monitor for any signs of worsening or infection if anything occurs he should let me know as soon as possible. 4. With regard to the chest x-ray findings the patient tells me that he actually has a CT scan for this coming up shortly this has already been ordered  I think he said if it was not tomorrow its within the next week. We will see patient back for reevaluation in 1 week here in the clinic. If anything worsens or changes patient will contact our office for additional recommendations. Electronic Signature(s) Signed: 07/22/2020 3:51:42 PM By: Jose Kelp PA-C Entered By: [name] on 07/22/2020 15:51:42 -------------------------------------------------------------------------------- SuperBill Details Patient Name: Date of Service: [name], RA NDO [facility] 07/22/2020 Medical Record Number: [id number] Patient Account Number: [id number] Date of Birth/Sex: Treating RN: 1970-07-23 (50 y.o. [name] Primary Care  Provider: Lia [name] Other Clinician: Referring Provider: Treating Provider/Extender: [name] in Treatment: 11 Diagnosis Coding ICD-10 Codes Code Description E11.621 Type 2 diabetes mellitus with foot ulcer L97.512 Non-pressure chronic ulcer of other part of right foot with fat layer exposed E11.42 Type 2 diabetes mellitus with diabetic polyneuropathy I10 Essential (primary) hypertension I50.42 Chronic combined systolic (congestive) and diastolic (congestive) heart failure F17.218 Nicotine dependence, cigarettes, with other nicotine-induced disorders Facility Procedures CPT4 Code: [id number] Description: 11042 - DEB SUBQ TISSUE 20 SQ CM/< ICD-10 Diagnosis Description L97.512 Non-pressure chronic ulcer of other part of right foot with fat layer exposed Modifier: Quantity: 1 Physician Procedures : CPT4 Code Description Modifier [id number] - WC PHYS LEVEL 4 - EST PT 25 1 ICD-10 Diagnosis Description E11.621 Type 2 diabetes mellitus with foot ulcer L97.512 Non-pressure chronic ulcer of other part of right foot with fat layer exposed E11.42 Type  2 diabetes mellitus with diabetic polyneuropathy I10 Essential (primary) hypertension Quantity: : [id number] 11042 - WC PHYS SUBQ TISS 20 SQ CM 1 ICD-10 Diagnosis Description L97.512 Non-pressure chronic ulcer of other part of right foot with fat layer exposed Quantity: Electronic Signature(s) Signed: 07/22/2020 3:51:56 PM By: Jose Kelp PA-C Entered By: [name] on 07/22/2020 15:51:56

## 2020-07-22 NOTE — Progress Notes (Signed)
Cardiology Office Note  Date: 07/23/2020   ID: [name], DOB 1970/12/24, MRN [id number]  PCP:  Toma Deiters, MD  Cardiologist:  Dina Rich, MD Electrophysiologist:  None   Chief Complaint: Follow up   History of Present Illness: [name] is a 50 y.o. male with a history of Chronic systolic heart failure.   She was last seen by [name], PA-C via telemedicine 01/29/2019.  He denied any anginal or exertional symptoms, DOE, orthopnea, PND, or edema.  Initially had fatigue after starting an Entresto but this had resolved.  He denied any lightheadedness or dizziness or presyncope.  His current medication regimen was Toprol-XL 50 mg daily, Entresto 24-26 mg p.o. twice daily.  Blood pressure was 111/78.  He was encouraged to keep a BP log for the next 2 to 3 weeks and report back with readings.  Pending blood pressure trends would try adding spironolactone as he may not tolerate increasing Entresto dose.  Plan was for repeat echocardiogram 3 months following optimization of medical therapy.  He was continuing aspirin 81 mg, simvastatin 20 mg daily, and Toprol-XL 50 mg daily for CAD.  He was here last visit for follow-up of chronic systolic heart failure.  His PCP recently ordered a follow-up echocardiogram which was performed at [facility] on 05/29/2020.  Echocardiogram showed  EF of 30 to 35%, G2 DD, trivial MR. He was undergoing wound care for a nonhealing ulcer on his right foot.  He was to start hyperbaric O2 therapy to help with healing.  He has poorly controlled diabetes with peripheral neuropathy.  He has insulin dosage was recently increased by his PCP for better control of his blood sugars.  He denied any DOE or SOB.  He was not very active on a daily basis due to his wound on right foot.  Blood pressure is elevated at 142/82 today.  He stated his blood pressure was  elevated recently at PCP office also with BP of 140/80 on 05/11/2020 office visit.  He  was recently started on Farxiga by his primary care provider.  He was continuing to smoke/vape.  He stated he was primarily vaping now versus smoking.  He was here for clearance to undergo hyperbaric oxygen therapy.   At last visit we increased his dose of Entresto up to 49/51 mg p.o. twice daily.  He states he is continuing to have some issues with his right foot with more wound debridement and treatment for nonhealing ulcer.  He was to start hyperbaric oxygen therapy but had a chest x-ray prior demonstrating evidence of chronic interstitial changes with small cluster of nodular densities in the right lung.  11 mm nodular density over the left lung could represent a nipple shadow.  CT chest without contrast was recommended.  Patient states he is pending CT scan before HBO therapy is initiated.  His recent follow-up labs status post increase of Entresto were all normal.  States he is tolerating the Griffith well.  He states he briefly stopped his medicines but has started them back in the interim since last visit.  His blood pressure is slightly better than previous visit.  Today's blood pressure is 130/94.    Past Medical History:  Diagnosis Date   Angina pectoris with documented spasm (HCC)    CAD (coronary artery disease)    a. cath in 07/2017 showing occluded LCx and occluded RCA filled by collaterals with consideration of PCI to CTO RCA and LCx if myocardium viable -->  did not follow-up after cath   CHF (congestive heart failure) (HCC)    a. EF 30-35% in 07/2017 b. similar results by repeat echo in 04/2018   Corns and callosities    Diabetes mellitus without complication (HCC)    Hyperlipidemia    Hypertension    Other sleep apnea    Pancreatitis     Past Surgical History:  Procedure Laterality Date   APPENDECTOMY  2016   OTHER SURGICAL HISTORY  2016   RIGHT/LEFT HEART CATH AND CORONARY ANGIOGRAPHY N/A 08/08/2017   Procedure: RIGHT/LEFT HEART CATH AND CORONARY ANGIOGRAPHY;  Surgeon:  [name], MD;  Location: [facility];  Service: Cardiovascular;  Laterality: N/A;    Current Outpatient Medications  Medication Sig Dispense Refill   aspirin EC 81 MG tablet Take 1 tablet (81 mg total) by mouth daily. 90 tablet 3   dapagliflozin propanediol (FARXIGA) 10 MG TABS tablet Take 10 mg by mouth daily.     DULoxetine (CYMBALTA) 60 MG capsule Take 60 mg by mouth daily.      FLUoxetine (PROZAC) 20 MG capsule Take 20 mg by mouth every morning.     gabapentin (NEURONTIN) 300 MG capsule Take 300 mg by mouth 4 (four) times daily.     insulin lispro (HUMALOG) 100 UNIT/ML injection Inject 15 Units into the skin 3 (three) times daily before meals.     Insulin Pen Needle (EXEL COMFORT POINT PEN NEEDLE) 31G X 6 MM MISC USE ONCE DAILY TO INJECT INSULIN     LEVEMIR FLEXTOUCH 100 UNIT/ML Pen Inject 50 Units into the skin 2 (two) times daily. Dependant upon blood sugar level  2   metFORMIN (GLUCOPHAGE) 1000 MG tablet Take 1,000 mg by mouth 2 (two) times daily with a meal.     metoprolol succinate (TOPROL-XL) 50 MG 24 hr tablet TAKE ONE TABLET BY MOUTH DAILY. TAKE WITH OR IMMEDIATELY FOLLOWING A MEAL. 90 tablet 0   Omega-3 Fatty Acids (FISH OIL) 1000 MG CAPS Take 1 capsule by mouth 3 (three) times daily.     sacubitril-valsartan (ENTRESTO) 49-51 MG Take 1 tablet by mouth 2 (two) times daily. 60 tablet 6   simvastatin (ZOCOR) 20 MG tablet Take 20 mg by mouth daily.     No current facility-administered medications for this visit.   Allergies:  Patient has no known allergies.   Social History: The patient  reports that he quit smoking about 3 years ago. His smoking use included e-cigarettes. He has never used smokeless tobacco. He reports that he does not drink alcohol and does not use drugs.   Family History: The patient's family history includes Aneurysm in his father.   ROS:  Please see the history of present illness. Otherwise, complete review of systems is positive for none.   All other systems are reviewed and negative.   Physical Exam: VS:  BP (!) 130/94   Pulse 82   Ht 6' (1.829 m)   Wt (!) 302 lb (137 kg)   SpO2 97%   BMI 40.96 kg/m , BMI Body mass index is 40.96 kg/m.  Wt Readings from Last 3 Encounters:  07/23/20 (!) 302 lb (137 kg)  06/22/20 (!) 302 lb 3.2 oz (137.1 kg)  01/29/19 296 lb (134.3 kg)    General: Morbidly obese patient appears comfortable at rest. Neck: Supple, no elevated JVP or carotid bruits, no thyromegaly. Lungs: Clear to auscultation, nonlabored breathing at rest. Cardiac: Regular rate and rhythm, no S3 or significant systolic murmur,  no pericardial rub. Extremities: No pitting edema, distal pulses 2+. Skin: Warm and dry. Musculoskeletal: No kyphosis. Neuropsychiatric: Alert and oriented x3, affect grossly appropriate.  ECG: 06/22/2020 EKG normal sinus rhythm rate of 73, ST/T wave abnormality consider lateral ischemia.  Recent Labwork: No results found for requested labs within last 8760 hours.  No results found for: CHOL, TRIG, HDL, CHOLHDL, VLDL, LDLCALC, LDLDIRECT  Other Studies Reviewed Today:  Echocardiogram 06/16/2020 [facility] Summary  1. Technically difficult study.  2. The left ventricle is mildly to moderately dilated in size with mildly increased wall thickness.  3. The left ventricular systolic function is moderately decreased, LVEF is visually estimated at 30-35%.  4. There is grade II diastolic dysfunction (elevated filling pressure).  5. The left atrium is moderately dilated in size.  6. The right ventricle is normal in size, with normal systolic function.   Left Ventricle    The left ventricle is mildly to moderately dilated in size with mildly  increased wall thickness.    The left ventricular systolic function is moderately decreased, LVEF is  visually estimated at 30-35%.    There is grade II diastolic dysfunction (elevated filling pressure).    No LV thrombus.   Right Ventricle    The right  ventricle is normal in size, with normal systolic function.    Left Atrium    The left atrium is moderately dilated in size.   Right Atrium    The right atrium is normal  in size.    Aortic Valve    The aortic valve is trileaflet with normal appearing leaflets with normal  excursion.    There is no significant aortic regurgitation.    There is no evidence of a significant transvalvular gradient.   Pulmonic Valve    The pulmonic valve is normal.    There is mild pulmonic regurgitation.    There is no evidence of a significant transvalvular gradient.   Mitral Valve    The mitral valve leaflets are normal with normal leaflet mobility.    There is trivial mitral valve regurgitation.   Tricuspid Valve    The tricuspid valve leaflets are normal, with normal leaflet mobility.    There is no significant tricuspid regurgitation.    Pulmonary systolic pressure cannot be estimated due to insufficient TR jet.    Other Findings    Rhythm: Sinus Rhythm.   Pericardium/Pleural    There is no pericardial effusion.   Inferior Vena Cava    IVC size and inspiratory change suggest normal right atrial pressure. (0-5  mmHg).   Aorta    The aorta is normal in size in the visualized segments    Cardiac Catheterization: 07/2017 Mid RCA lesion is 100% stenosed. Vessel fills by collaterals. Prox Cx to Mid Cx lesion is 100% stenosed. Vesel fills by left to left collaterals. Ramus lesion is 50% stenosed. Mid LAD lesion is 25% stenosed. LV end diastolic pressure is moderately elevated. There is no aortic valve stenosis. Hemodynamic findings consistent with mild pulmonary hypertension. Ao sat: 96%, PA sat 63%; CO: 5.4 L.min; CI: 2.1     Recommend Aspirin 81mg  daily for moderate CAD.  Consider CTO PCI attempt if the inferior and lateral myocardium is viable, after his foot ulcer has been addressed.    May need additional diuresis.     Continue treatment for sleep apnea.  Weight loss was  also recommended.     Restart metformin in 48 hours.     Echocardiogram: 04/2018  1. The left ventricle has moderate-severely reduced systolic function, with an ejection fraction of 30-35%. The cavity size was moderately dilated. Left ventricular diastolic Doppler parameters are consistent with pseudonormalization.  2. There is akinesis of the left ventricular, basal-mid inferior wall and lateral wall.  3. The right ventricle has normal systolic function. The cavity was normal. There is no increase in right ventricular wall thickness. Right ventricular systolic pressure could not be assessed.  4. The aortic valve is tricuspid. Mild to moderate aortic annular calcification noted.  5. The mitral valve is grossly normal. There is mild mitral annular calcification present. There is calcification of the posteromedial papillary muscle and apparatus.  6. The tricuspid valve is grossly normal.  7. The aortic root is normal in size and structure.  8. The interatrial septum was not assessed.  Assessment and Plan:  1. Chronic systolic heart failure (HCC)   2. Mixed hyperlipidemia   3. Essential hypertension   4. Non-healing ulcer of lower extremity, right, with unspecified severity (HCC)     1. Chronic systolic heart failure [facility]) Recent echocardiogram [facility] on 05/29/2020.  He continues with a EF of 30 to 35%, G2 DD, trivial MR. Continue Entresto to 49/51 mg p.o. twice daily. Continue Toprol-XL 50 mg daily.  2. Mixed hyperlipidemia Continue simvastatin 20 mg daily.  Continue aspirin 81 mg daily.  3. Essential hypertension Blood pressure 130/94 today after recent increase in Entresto to 49/51 mg p.o. twice daily.  Continue Toprol 50 mg p.o. daily.   . 4.  Nonhealing ulcer of right lower extremity He was last here for clearance to undergo hyperbaric oxygen therapy for poor wound healing of right lower extremity ulcer on his foot.  He is undergoing wound care therapy but needs hyperbaric  oxygen.  He has a history of chronic systolic heart failure.  He states today since he was last seen here he had a chest x-ray pending initiation of HBO therapy which showed evidence of some pulmonary nodules.  He has a pending chest CT for further investigation prior to starting HBO therapy for nonhealing ulcer.  Medication Adjustments/Labs and Tests Ordered: Current medicines are reviewed at length with the patient today.  Concerns regarding medicines are outlined above.   Disposition: Follow-up with [name] or APP 1 month  Signed, Rennis Harding, NP 07/23/2020 1:40 PM    [facility] at [facility] [address] [place], [place], [place] [address] Phone: [phone]; Fax: [phone]

## 2020-07-23 ENCOUNTER — Ambulatory Visit (INDEPENDENT_AMBULATORY_CARE_PROVIDER_SITE_OTHER): Payer: Medicare Other | Admitting: Family Medicine

## 2020-07-23 ENCOUNTER — Encounter: Payer: Self-pay | Admitting: Family Medicine

## 2020-07-23 VITALS — BP 130/94 | HR 82 | Ht 72.0 in | Wt 302.0 lb

## 2020-07-23 DIAGNOSIS — L97919 Non-pressure chronic ulcer of unspecified part of right lower leg with unspecified severity: Secondary | ICD-10-CM

## 2020-07-23 DIAGNOSIS — E782 Mixed hyperlipidemia: Secondary | ICD-10-CM | POA: Diagnosis not present

## 2020-07-23 DIAGNOSIS — I10 Essential (primary) hypertension: Secondary | ICD-10-CM

## 2020-07-23 DIAGNOSIS — I5022 Chronic systolic (congestive) heart failure: Secondary | ICD-10-CM

## 2020-07-23 NOTE — Patient Instructions (Signed)
Medication Instructions:  Your physician recommends that you continue on your current medications as directed. Please refer to the Current Medication list given to you today.  *If you need a refill on your cardiac medications before your next appointment, please call your pharmacy*   Lab Work: None If you have labs (blood work) drawn today and your tests are completely normal, you will receive your results only by: MyChart Message (if you have MyChart) OR A paper copy in the mail If you have any lab test that is abnormal or we need to change your treatment, we will call you to review the results.   Testing/Procedures: None   Follow-Up: At [facility], you and your health needs are our priority.  As part of our continuing mission to provide you with exceptional heart care, we have created designated Provider Care Teams.  These Care Teams include your primary Cardiologist (physician) and Advanced Practice Providers (APPs -  Physician Assistants and Nurse Practitioners) who all work together to provide you with the care you need, when you need it.  We recommend signing up for the patient portal called "MyChart".  Sign up information is provided on this After Visit Summary.  MyChart is used to connect with patients for Virtual Visits (Telemedicine).  Patients are able to view lab/test results, encounter notes, upcoming appointments, etc.  Non-urgent messages can be sent to your provider as well.   To learn more about what you can do with MyChart, go to [email or web address].    Your next appointment:   1 month(s)  The format for your next appointment:   In Person  Provider:   Nena Polio, NP   Other Instructions

## 2020-07-24 DIAGNOSIS — E11621 Type 2 diabetes mellitus with foot ulcer: Secondary | ICD-10-CM | POA: Diagnosis not present

## 2020-07-24 DIAGNOSIS — B965 Pseudomonas (aeruginosa) (mallei) (pseudomallei) as the cause of diseases classified elsewhere: Secondary | ICD-10-CM | POA: Diagnosis not present

## 2020-07-24 DIAGNOSIS — B9562 Methicillin resistant Staphylococcus aureus infection as the cause of diseases classified elsewhere: Secondary | ICD-10-CM | POA: Diagnosis not present

## 2020-07-24 DIAGNOSIS — E1169 Type 2 diabetes mellitus with other specified complication: Secondary | ICD-10-CM | POA: Diagnosis not present

## 2020-07-24 DIAGNOSIS — L97412 Non-pressure chronic ulcer of right heel and midfoot with fat layer exposed: Secondary | ICD-10-CM | POA: Diagnosis not present

## 2020-07-24 DIAGNOSIS — M869 Osteomyelitis, unspecified: Secondary | ICD-10-CM | POA: Diagnosis not present

## 2020-07-27 DIAGNOSIS — B9562 Methicillin resistant Staphylococcus aureus infection as the cause of diseases classified elsewhere: Secondary | ICD-10-CM | POA: Diagnosis not present

## 2020-07-27 DIAGNOSIS — M869 Osteomyelitis, unspecified: Secondary | ICD-10-CM | POA: Diagnosis not present

## 2020-07-27 DIAGNOSIS — E1169 Type 2 diabetes mellitus with other specified complication: Secondary | ICD-10-CM | POA: Diagnosis not present

## 2020-07-27 DIAGNOSIS — E11621 Type 2 diabetes mellitus with foot ulcer: Secondary | ICD-10-CM | POA: Diagnosis not present

## 2020-07-27 DIAGNOSIS — B965 Pseudomonas (aeruginosa) (mallei) (pseudomallei) as the cause of diseases classified elsewhere: Secondary | ICD-10-CM | POA: Diagnosis not present

## 2020-07-27 DIAGNOSIS — L97412 Non-pressure chronic ulcer of right heel and midfoot with fat layer exposed: Secondary | ICD-10-CM | POA: Diagnosis not present

## 2020-07-28 DIAGNOSIS — R911 Solitary pulmonary nodule: Secondary | ICD-10-CM | POA: Diagnosis not present

## 2020-07-28 DIAGNOSIS — J9811 Atelectasis: Secondary | ICD-10-CM | POA: Diagnosis not present

## 2020-07-29 ENCOUNTER — Encounter (HOSPITAL_BASED_OUTPATIENT_CLINIC_OR_DEPARTMENT_OTHER): Payer: Medicare Other | Admitting: Physician Assistant

## 2020-07-30 DIAGNOSIS — B965 Pseudomonas (aeruginosa) (mallei) (pseudomallei) as the cause of diseases classified elsewhere: Secondary | ICD-10-CM | POA: Diagnosis not present

## 2020-07-30 DIAGNOSIS — L97412 Non-pressure chronic ulcer of right heel and midfoot with fat layer exposed: Secondary | ICD-10-CM | POA: Diagnosis not present

## 2020-07-30 DIAGNOSIS — B9562 Methicillin resistant Staphylococcus aureus infection as the cause of diseases classified elsewhere: Secondary | ICD-10-CM | POA: Diagnosis not present

## 2020-07-30 DIAGNOSIS — M869 Osteomyelitis, unspecified: Secondary | ICD-10-CM | POA: Diagnosis not present

## 2020-07-30 DIAGNOSIS — E1169 Type 2 diabetes mellitus with other specified complication: Secondary | ICD-10-CM | POA: Diagnosis not present

## 2020-07-30 DIAGNOSIS — E11621 Type 2 diabetes mellitus with foot ulcer: Secondary | ICD-10-CM | POA: Diagnosis not present

## 2020-08-03 DIAGNOSIS — B9562 Methicillin resistant Staphylococcus aureus infection as the cause of diseases classified elsewhere: Secondary | ICD-10-CM | POA: Diagnosis not present

## 2020-08-03 DIAGNOSIS — M869 Osteomyelitis, unspecified: Secondary | ICD-10-CM | POA: Diagnosis not present

## 2020-08-03 DIAGNOSIS — L97412 Non-pressure chronic ulcer of right heel and midfoot with fat layer exposed: Secondary | ICD-10-CM | POA: Diagnosis not present

## 2020-08-03 DIAGNOSIS — E1169 Type 2 diabetes mellitus with other specified complication: Secondary | ICD-10-CM | POA: Diagnosis not present

## 2020-08-03 DIAGNOSIS — B965 Pseudomonas (aeruginosa) (mallei) (pseudomallei) as the cause of diseases classified elsewhere: Secondary | ICD-10-CM | POA: Diagnosis not present

## 2020-08-03 DIAGNOSIS — E11621 Type 2 diabetes mellitus with foot ulcer: Secondary | ICD-10-CM | POA: Diagnosis not present

## 2020-08-06 DIAGNOSIS — E11621 Type 2 diabetes mellitus with foot ulcer: Secondary | ICD-10-CM | POA: Diagnosis not present

## 2020-08-06 DIAGNOSIS — L97412 Non-pressure chronic ulcer of right heel and midfoot with fat layer exposed: Secondary | ICD-10-CM | POA: Diagnosis not present

## 2020-08-06 DIAGNOSIS — B9562 Methicillin resistant Staphylococcus aureus infection as the cause of diseases classified elsewhere: Secondary | ICD-10-CM | POA: Diagnosis not present

## 2020-08-06 DIAGNOSIS — M869 Osteomyelitis, unspecified: Secondary | ICD-10-CM | POA: Diagnosis not present

## 2020-08-06 DIAGNOSIS — E1169 Type 2 diabetes mellitus with other specified complication: Secondary | ICD-10-CM | POA: Diagnosis not present

## 2020-08-06 DIAGNOSIS — B965 Pseudomonas (aeruginosa) (mallei) (pseudomallei) as the cause of diseases classified elsewhere: Secondary | ICD-10-CM | POA: Diagnosis not present

## 2020-08-10 DIAGNOSIS — I10 Essential (primary) hypertension: Secondary | ICD-10-CM | POA: Diagnosis not present

## 2020-08-10 DIAGNOSIS — E1142 Type 2 diabetes mellitus with diabetic polyneuropathy: Secondary | ICD-10-CM | POA: Diagnosis not present

## 2020-08-10 DIAGNOSIS — E7849 Other hyperlipidemia: Secondary | ICD-10-CM | POA: Diagnosis not present

## 2020-08-10 DIAGNOSIS — Z0000 Encounter for general adult medical examination without abnormal findings: Secondary | ICD-10-CM | POA: Diagnosis not present

## 2020-08-10 DIAGNOSIS — M868X7 Other osteomyelitis, ankle and foot: Secondary | ICD-10-CM | POA: Diagnosis not present

## 2020-08-10 DIAGNOSIS — I5022 Chronic systolic (congestive) heart failure: Secondary | ICD-10-CM | POA: Diagnosis not present

## 2020-08-11 DIAGNOSIS — E1169 Type 2 diabetes mellitus with other specified complication: Secondary | ICD-10-CM | POA: Diagnosis not present

## 2020-08-11 DIAGNOSIS — B965 Pseudomonas (aeruginosa) (mallei) (pseudomallei) as the cause of diseases classified elsewhere: Secondary | ICD-10-CM | POA: Diagnosis not present

## 2020-08-11 DIAGNOSIS — L97412 Non-pressure chronic ulcer of right heel and midfoot with fat layer exposed: Secondary | ICD-10-CM | POA: Diagnosis not present

## 2020-08-11 DIAGNOSIS — E11621 Type 2 diabetes mellitus with foot ulcer: Secondary | ICD-10-CM | POA: Diagnosis not present

## 2020-08-11 DIAGNOSIS — M869 Osteomyelitis, unspecified: Secondary | ICD-10-CM | POA: Diagnosis not present

## 2020-08-11 DIAGNOSIS — B9562 Methicillin resistant Staphylococcus aureus infection as the cause of diseases classified elsewhere: Secondary | ICD-10-CM | POA: Diagnosis not present

## 2020-08-18 DIAGNOSIS — M130 Polyarthritis, unspecified: Secondary | ICD-10-CM | POA: Diagnosis not present

## 2020-08-18 DIAGNOSIS — Z79891 Long term (current) use of opiate analgesic: Secondary | ICD-10-CM | POA: Diagnosis not present

## 2020-08-18 DIAGNOSIS — E1151 Type 2 diabetes mellitus with diabetic peripheral angiopathy without gangrene: Secondary | ICD-10-CM | POA: Diagnosis not present

## 2020-08-18 DIAGNOSIS — Z89411 Acquired absence of right great toe: Secondary | ICD-10-CM | POA: Diagnosis not present

## 2020-08-18 DIAGNOSIS — E1140 Type 2 diabetes mellitus with diabetic neuropathy, unspecified: Secondary | ICD-10-CM | POA: Diagnosis not present

## 2020-08-18 DIAGNOSIS — I5022 Chronic systolic (congestive) heart failure: Secondary | ICD-10-CM | POA: Diagnosis not present

## 2020-08-18 DIAGNOSIS — I2510 Atherosclerotic heart disease of native coronary artery without angina pectoris: Secondary | ICD-10-CM | POA: Diagnosis not present

## 2020-08-18 DIAGNOSIS — E1142 Type 2 diabetes mellitus with diabetic polyneuropathy: Secondary | ICD-10-CM | POA: Diagnosis not present

## 2020-08-18 DIAGNOSIS — G894 Chronic pain syndrome: Secondary | ICD-10-CM | POA: Diagnosis not present

## 2020-08-18 DIAGNOSIS — M545 Low back pain, unspecified: Secondary | ICD-10-CM | POA: Diagnosis not present

## 2020-08-18 DIAGNOSIS — Z79899 Other long term (current) drug therapy: Secondary | ICD-10-CM | POA: Diagnosis not present

## 2020-08-18 DIAGNOSIS — I110 Hypertensive heart disease with heart failure: Secondary | ICD-10-CM | POA: Diagnosis not present

## 2020-08-18 DIAGNOSIS — Z8631 Personal history of diabetic foot ulcer: Secondary | ICD-10-CM | POA: Diagnosis not present

## 2020-08-23 NOTE — Progress Notes (Deleted)
Cardiology Office Note  Date: 08/23/2020   ID: [name], DOB May 26, 1970, MRN [id number]  PCP:  Jose Deiters, MD  Cardiologist:  Jose Rich, MD Electrophysiologist:  None   Chief Complaint: Follow up   History of Present Illness: [name] is a 50 y.o. male with a history of Chronic systolic heart failure.   She was last seen by Jose An, PA-C via telemedicine 01/29/2019.  He denied any anginal or exertional symptoms, DOE, orthopnea, PND, or edema.  Initially had fatigue after starting [name] Entresto but this had resolved.  He denied any lightheadedness or dizziness or presyncope.  His current medication regimen was Toprol-XL 50 mg daily, Entresto 24-26 mg p.o. twice daily.  Blood pressure was 111/78.  He was encouraged to keep a BP log for the next 2 to 3 weeks and report back with readings.  Pending blood pressure trends would try adding spironolactone as he may not tolerate increasing Entresto dose.  Plan was for repeat echocardiogram 3 months following optimization of medical therapy.  He was continuing aspirin 81 mg, simvastatin 20 mg daily, and Toprol-XL 50 mg daily for CAD.  He was here last visit for follow-up of chronic systolic heart failure.  His PCP recently ordered a follow-up echocardiogram which was performed at [facility] on 05/29/2020.  Echocardiogram showed  EF of 30 to 35%, G2 DD, trivial MR. He was undergoing wound care for a nonhealing ulcer on his right foot.  He was to start hyperbaric O2 therapy to help with healing.  He has poorly controlled diabetes with peripheral neuropathy.  He has insulin dosage was recently increased by his PCP for better control of his blood sugars.  He denied any DOE or SOB.  He was not very active on a daily basis due to his wound on right foot.  Blood pressure is elevated at 142/82 today.  He stated his blood pressure was  elevated recently at PCP office also with BP of 140/80 on 05/11/2020 office visit.  He  was recently started on Farxiga by his primary care provider.  He was continuing to smoke/vape.  He stated he was primarily vaping now versus smoking.  He was here for clearance to undergo hyperbaric oxygen therapy.   At last visit we increased his dose of Entresto up to 49/51 mg p.o. twice daily.  He states he is continuing to have some issues with his right foot with more wound debridement and treatment for nonhealing ulcer.  He was to start hyperbaric oxygen therapy but had a chest x-ray prior demonstrating evidence of chronic interstitial changes with small cluster of nodular densities in the right lung.  11 mm nodular density over the left lung could represent a nipple shadow.  CT chest without contrast was recommended.  Patient states he is pending CT scan before HBO therapy is initiated.  His recent follow-up labs status post increase of Entresto were all normal.  States he is tolerating the [place] well.  He states he briefly stopped his medicines but has started them back in the interim since last visit.  His blood pressure is slightly better than previous visit.  Today's blood pressure is 130/94.  Adjust Entresto ? Labs ?   Past Medical History:  Diagnosis Date   Angina pectoris with documented spasm (HCC)    CAD (coronary artery disease)    a. cath in 07/2017 showing occluded LCx and occluded RCA filled by collaterals with consideration of PCI to CTO RCA  and LCx if myocardium viable --> did not follow-up after cath   CHF (congestive heart failure) (HCC)    a. EF 30-35% in 07/2017 b. similar results by repeat echo in 04/2018   Corns and callosities    Diabetes mellitus without complication (HCC)    Hyperlipidemia    Hypertension    Other sleep apnea    Pancreatitis     Past Surgical History:  Procedure Laterality Date   APPENDECTOMY  2016   OTHER SURGICAL HISTORY  2016   RIGHT/LEFT HEART CATH AND CORONARY ANGIOGRAPHY N/A 08/08/2017   Procedure: RIGHT/LEFT HEART CATH AND CORONARY  ANGIOGRAPHY;  Surgeon: [name], MD;  Location: [facility] INVASIVE CV LAB;  Service: Cardiovascular;  Laterality: N/A;    Current Outpatient Medications  Medication Sig Dispense Refill   aspirin EC 81 MG tablet Take 1 tablet (81 mg total) by mouth daily. 90 tablet 3   dapagliflozin propanediol (FARXIGA) 10 MG TABS tablet Take 10 mg by mouth daily.     DULoxetine (CYMBALTA) 60 MG capsule Take 60 mg by mouth daily.      FLUoxetine (PROZAC) 20 MG capsule Take 20 mg by mouth every morning.     gabapentin (NEURONTIN) 300 MG capsule Take 300 mg by mouth 4 (four) times daily.     insulin lispro (HUMALOG) 100 UNIT/ML injection Inject 15 Units into the skin 3 (three) times daily before meals.     Insulin Pen Needle (EXEL COMFORT POINT PEN NEEDLE) 31G X 6 MM MISC USE ONCE DAILY TO INJECT INSULIN     LEVEMIR FLEXTOUCH 100 UNIT/ML Pen Inject 50 Units into the skin 2 (two) times daily. Dependant upon blood sugar level  2   metFORMIN (GLUCOPHAGE) 1000 MG tablet Take 1,000 mg by mouth 2 (two) times daily with a meal.     metoprolol succinate (TOPROL-XL) 50 MG 24 hr tablet TAKE ONE TABLET BY MOUTH DAILY. TAKE WITH OR IMMEDIATELY FOLLOWING A MEAL. 90 tablet 0   Omega-3 Fatty Acids (FISH OIL) 1000 MG CAPS Take 1 capsule by mouth 3 (three) times daily.     sacubitril-valsartan (ENTRESTO) 49-51 MG Take 1 tablet by mouth 2 (two) times daily. 60 tablet 6   simvastatin (ZOCOR) 20 MG tablet Take 20 mg by mouth daily.     No current facility-administered medications for this visit.   Allergies:  Patient has no known allergies.   Social History: The patient  reports that he quit smoking about 3 years ago. His smoking use included e-cigarettes. He has never used smokeless tobacco. He reports that he does not drink alcohol and does not use drugs.   Family History: The patient's family history includes Aneurysm in his father.   ROS:  Please see the history of present illness. Otherwise, complete review of systems  is positive for none.  All other systems are reviewed and negative.   Physical Exam: VS:  There were no vitals taken for this visit., BMI There is no height or weight on file to calculate BMI.  Wt Readings from Last 3 Encounters:  07/23/20 (!) 302 lb (137 kg)  06/22/20 (!) 302 lb 3.2 oz (137.1 kg)  01/29/19 296 lb (134.3 kg)    General: Morbidly obese patient appears comfortable at rest. Neck: Supple, no elevated JVP or carotid bruits, no thyromegaly. Lungs: Clear to auscultation, nonlabored breathing at rest. Cardiac: Regular rate and rhythm, no S3 or significant systolic murmur, no pericardial rub. Extremities: No pitting edema, distal pulses 2+. Skin: Warm  and dry. Musculoskeletal: No kyphosis. Neuropsychiatric: Alert and oriented x3, affect grossly appropriate.  ECG: 06/22/2020 EKG normal sinus rhythm rate of 73, ST/T wave abnormality consider lateral ischemia.  Recent Labwork: No results found for requested labs within last 8760 hours.  No results found for: CHOL, TRIG, HDL, CHOLHDL, VLDL, LDLCALC, LDLDIRECT  Other Studies Reviewed Today:  Echocardiogram 06/16/2020 [facility] Summary  1. Technically difficult study.  2. The left ventricle is mildly to moderately dilated in size with mildly increased wall thickness.  3. The left ventricular systolic function is moderately decreased, LVEF is visually estimated at 30-35%.  4. There is grade II diastolic dysfunction (elevated filling pressure).  5. The left atrium is moderately dilated in size.  6. The right ventricle is normal in size, with normal systolic function.   Left Ventricle    The left ventricle is mildly to moderately dilated in size with mildly  increased wall thickness.    The left ventricular systolic function is moderately decreased, LVEF is  visually estimated at 30-35%.    There is grade II diastolic dysfunction (elevated filling pressure).    No LV thrombus.   Right Ventricle    The right ventricle is normal  in size, with normal systolic function.    Left Atrium    The left atrium is moderately dilated in size.   Right Atrium    The right atrium is normal  in size.    Aortic Valve    The aortic valve is trileaflet with normal appearing leaflets with normal  excursion.    There is no significant aortic regurgitation.    There is no evidence of a significant transvalvular gradient.   Pulmonic Valve    The pulmonic valve is normal.    There is mild pulmonic regurgitation.    There is no evidence of a significant transvalvular gradient.   Mitral Valve    The mitral valve leaflets are normal with normal leaflet mobility.    There is trivial mitral valve regurgitation.   Tricuspid Valve    The tricuspid valve leaflets are normal, with normal leaflet mobility.    There is no significant tricuspid regurgitation.    Pulmonary systolic pressure cannot be estimated due to insufficient TR jet.    Other Findings    Rhythm: Sinus Rhythm.   Pericardium/Pleural    There is no pericardial effusion.   Inferior Vena Cava    IVC size and inspiratory change suggest normal right atrial pressure. (0-5  mmHg).   Aorta    The aorta is normal in size in the visualized segments    Cardiac Catheterization: 07/2017 Mid RCA lesion is 100% stenosed. Vessel fills by collaterals. Prox Cx to Mid Cx lesion is 100% stenosed. Vesel fills by left to left collaterals. Ramus lesion is 50% stenosed. Mid LAD lesion is 25% stenosed. LV end diastolic pressure is moderately elevated. There is no aortic valve stenosis. Hemodynamic findings consistent with mild pulmonary hypertension. Ao sat: 96%, PA sat 63%; CO: 5.4 L.min; CI: 2.1     Recommend Aspirin 81mg  daily for moderate CAD.  Consider CTO PCI attempt if the inferior and lateral myocardium is viable, after his foot ulcer has been addressed.    May need additional diuresis.     Continue treatment for sleep apnea.  Weight loss was also recommended.      Restart metformin in 48 hours.     Echocardiogram: 04/2018   1. The left ventricle has moderate-severely reduced systolic function, with [name]  ejection fraction of 30-35%. The cavity size was moderately dilated. Left ventricular diastolic Doppler parameters are consistent with pseudonormalization.  2. There is akinesis of the left ventricular, basal-mid inferior wall and lateral wall.  3. The right ventricle has normal systolic function. The cavity was normal. There is no increase in right ventricular wall thickness. Right ventricular systolic pressure could not be assessed.  4. The aortic valve is tricuspid. Mild to moderate aortic annular calcification noted.  5. The mitral valve is grossly normal. There is mild mitral annular calcification present. There is calcification of the posteromedial papillary muscle and apparatus.  6. The tricuspid valve is grossly normal.  7. The aortic root is normal in size and structure.  8. The interatrial septum was not assessed.  Assessment and Plan:  1. Chronic systolic heart failure (HCC)   2. Mixed hyperlipidemia   3. Essential hypertension   4. Non-healing ulcer of lower extremity, right, with unspecified severity (HCC)      1. Chronic systolic heart failure [facility](HCC) [facility].  He continues with [name] EF of 30 to 35%, G2 DD, trivial MR. Continue Entresto to 49/51 mg p.o. twice daily. Continue Toprol-XL 50 mg daily.  2. Mixed hyperlipidemia Continue simvastatin 20 mg daily.  Continue aspirin 81 mg daily.  3. Essential hypertension Blood pressure 130/94 today after recent increase in Entresto to 49/51 mg p.o. twice daily.  Continue Toprol 50 mg p.o. daily.   . 4.  Nonhealing ulcer of right lower extremity He was last here for clearance to undergo hyperbaric oxygen therapy for poor wound healing of right lower extremity ulcer on his foot.  He is undergoing wound care therapy but needs hyperbaric oxygen.  He has a  history of chronic systolic heart failure.  He states today since he was last seen here he had a chest x-ray pending initiation of HBO therapy which showed evidence of some pulmonary nodules.  He has a pending chest CT for further investigation prior to starting HBO therapy for nonhealing ulcer.  Medication Adjustments/Labs and Tests Ordered: Current medicines are reviewed at length with the patient today.  Concerns regarding medicines are outlined above.   Disposition: Follow-up with Dr. Wyline MoodBranch or APP 1 month  Signed, Rennis HardingAndrew Quinn, NP 08/23/2020 5:49 PM    [facility] [address], [place], [place] [address] Phone: [phone]) [phone]; Fax: [phone]) [phone]

## 2020-08-24 ENCOUNTER — Ambulatory Visit: Payer: Medicare Other | Admitting: Family Medicine

## 2020-08-24 DIAGNOSIS — L97919 Non-pressure chronic ulcer of unspecified part of right lower leg with unspecified severity: Secondary | ICD-10-CM

## 2020-08-24 DIAGNOSIS — E782 Mixed hyperlipidemia: Secondary | ICD-10-CM

## 2020-08-24 DIAGNOSIS — I10 Essential (primary) hypertension: Secondary | ICD-10-CM

## 2020-08-24 DIAGNOSIS — I5022 Chronic systolic (congestive) heart failure: Secondary | ICD-10-CM

## 2020-08-25 DIAGNOSIS — I5022 Chronic systolic (congestive) heart failure: Secondary | ICD-10-CM | POA: Diagnosis not present

## 2020-08-25 DIAGNOSIS — I110 Hypertensive heart disease with heart failure: Secondary | ICD-10-CM | POA: Diagnosis not present

## 2020-08-25 DIAGNOSIS — I2510 Atherosclerotic heart disease of native coronary artery without angina pectoris: Secondary | ICD-10-CM | POA: Diagnosis not present

## 2020-08-25 DIAGNOSIS — Z8631 Personal history of diabetic foot ulcer: Secondary | ICD-10-CM | POA: Diagnosis not present

## 2020-08-25 DIAGNOSIS — E1151 Type 2 diabetes mellitus with diabetic peripheral angiopathy without gangrene: Secondary | ICD-10-CM | POA: Diagnosis not present

## 2020-08-25 DIAGNOSIS — E1140 Type 2 diabetes mellitus with diabetic neuropathy, unspecified: Secondary | ICD-10-CM | POA: Diagnosis not present

## 2020-08-27 DIAGNOSIS — I110 Hypertensive heart disease with heart failure: Secondary | ICD-10-CM | POA: Diagnosis not present

## 2020-08-27 DIAGNOSIS — I2510 Atherosclerotic heart disease of native coronary artery without angina pectoris: Secondary | ICD-10-CM | POA: Diagnosis not present

## 2020-08-27 DIAGNOSIS — I5022 Chronic systolic (congestive) heart failure: Secondary | ICD-10-CM | POA: Diagnosis not present

## 2020-08-27 DIAGNOSIS — E1140 Type 2 diabetes mellitus with diabetic neuropathy, unspecified: Secondary | ICD-10-CM | POA: Diagnosis not present

## 2020-08-27 DIAGNOSIS — Z8631 Personal history of diabetic foot ulcer: Secondary | ICD-10-CM | POA: Diagnosis not present

## 2020-08-27 DIAGNOSIS — E1151 Type 2 diabetes mellitus with diabetic peripheral angiopathy without gangrene: Secondary | ICD-10-CM | POA: Diagnosis not present

## 2020-08-28 ENCOUNTER — Other Ambulatory Visit (HOSPITAL_COMMUNITY): Payer: Self-pay | Admitting: Neurology

## 2020-08-28 DIAGNOSIS — M545 Low back pain, unspecified: Secondary | ICD-10-CM

## 2020-09-01 DIAGNOSIS — I2510 Atherosclerotic heart disease of native coronary artery without angina pectoris: Secondary | ICD-10-CM | POA: Diagnosis not present

## 2020-09-01 DIAGNOSIS — I110 Hypertensive heart disease with heart failure: Secondary | ICD-10-CM | POA: Diagnosis not present

## 2020-09-01 DIAGNOSIS — Z8631 Personal history of diabetic foot ulcer: Secondary | ICD-10-CM | POA: Diagnosis not present

## 2020-09-01 DIAGNOSIS — E1151 Type 2 diabetes mellitus with diabetic peripheral angiopathy without gangrene: Secondary | ICD-10-CM | POA: Diagnosis not present

## 2020-09-01 DIAGNOSIS — E1140 Type 2 diabetes mellitus with diabetic neuropathy, unspecified: Secondary | ICD-10-CM | POA: Diagnosis not present

## 2020-09-01 DIAGNOSIS — I5022 Chronic systolic (congestive) heart failure: Secondary | ICD-10-CM | POA: Diagnosis not present

## 2020-09-03 DIAGNOSIS — E1151 Type 2 diabetes mellitus with diabetic peripheral angiopathy without gangrene: Secondary | ICD-10-CM | POA: Diagnosis not present

## 2020-09-03 DIAGNOSIS — I5022 Chronic systolic (congestive) heart failure: Secondary | ICD-10-CM | POA: Diagnosis not present

## 2020-09-03 DIAGNOSIS — Z8631 Personal history of diabetic foot ulcer: Secondary | ICD-10-CM | POA: Diagnosis not present

## 2020-09-03 DIAGNOSIS — I2510 Atherosclerotic heart disease of native coronary artery without angina pectoris: Secondary | ICD-10-CM | POA: Diagnosis not present

## 2020-09-03 DIAGNOSIS — E1140 Type 2 diabetes mellitus with diabetic neuropathy, unspecified: Secondary | ICD-10-CM | POA: Diagnosis not present

## 2020-09-03 DIAGNOSIS — I110 Hypertensive heart disease with heart failure: Secondary | ICD-10-CM | POA: Diagnosis not present

## 2020-09-06 DIAGNOSIS — Z8631 Personal history of diabetic foot ulcer: Secondary | ICD-10-CM | POA: Diagnosis not present

## 2020-09-06 DIAGNOSIS — E1140 Type 2 diabetes mellitus with diabetic neuropathy, unspecified: Secondary | ICD-10-CM | POA: Diagnosis not present

## 2020-09-06 DIAGNOSIS — E1151 Type 2 diabetes mellitus with diabetic peripheral angiopathy without gangrene: Secondary | ICD-10-CM | POA: Diagnosis not present

## 2020-09-06 DIAGNOSIS — I2510 Atherosclerotic heart disease of native coronary artery without angina pectoris: Secondary | ICD-10-CM | POA: Diagnosis not present

## 2020-09-06 DIAGNOSIS — I110 Hypertensive heart disease with heart failure: Secondary | ICD-10-CM | POA: Diagnosis not present

## 2020-09-06 DIAGNOSIS — I5022 Chronic systolic (congestive) heart failure: Secondary | ICD-10-CM | POA: Diagnosis not present

## 2020-09-10 DIAGNOSIS — E1140 Type 2 diabetes mellitus with diabetic neuropathy, unspecified: Secondary | ICD-10-CM | POA: Diagnosis not present

## 2020-09-10 DIAGNOSIS — E1151 Type 2 diabetes mellitus with diabetic peripheral angiopathy without gangrene: Secondary | ICD-10-CM | POA: Diagnosis not present

## 2020-09-10 DIAGNOSIS — I2510 Atherosclerotic heart disease of native coronary artery without angina pectoris: Secondary | ICD-10-CM | POA: Diagnosis not present

## 2020-09-10 DIAGNOSIS — I5022 Chronic systolic (congestive) heart failure: Secondary | ICD-10-CM | POA: Diagnosis not present

## 2020-09-10 DIAGNOSIS — I110 Hypertensive heart disease with heart failure: Secondary | ICD-10-CM | POA: Diagnosis not present

## 2020-09-10 DIAGNOSIS — Z8631 Personal history of diabetic foot ulcer: Secondary | ICD-10-CM | POA: Diagnosis not present

## 2020-09-14 DIAGNOSIS — E1151 Type 2 diabetes mellitus with diabetic peripheral angiopathy without gangrene: Secondary | ICD-10-CM | POA: Diagnosis not present

## 2020-09-14 DIAGNOSIS — I110 Hypertensive heart disease with heart failure: Secondary | ICD-10-CM | POA: Diagnosis not present

## 2020-09-14 DIAGNOSIS — Z8631 Personal history of diabetic foot ulcer: Secondary | ICD-10-CM | POA: Diagnosis not present

## 2020-09-14 DIAGNOSIS — E1140 Type 2 diabetes mellitus with diabetic neuropathy, unspecified: Secondary | ICD-10-CM | POA: Diagnosis not present

## 2020-09-14 DIAGNOSIS — I5022 Chronic systolic (congestive) heart failure: Secondary | ICD-10-CM | POA: Diagnosis not present

## 2020-09-14 DIAGNOSIS — I2510 Atherosclerotic heart disease of native coronary artery without angina pectoris: Secondary | ICD-10-CM | POA: Diagnosis not present

## 2020-09-15 DIAGNOSIS — M545 Low back pain, unspecified: Secondary | ICD-10-CM | POA: Diagnosis not present

## 2020-09-15 DIAGNOSIS — Z79899 Other long term (current) drug therapy: Secondary | ICD-10-CM | POA: Diagnosis not present

## 2020-09-15 DIAGNOSIS — E1142 Type 2 diabetes mellitus with diabetic polyneuropathy: Secondary | ICD-10-CM | POA: Diagnosis not present

## 2020-09-15 DIAGNOSIS — Z89411 Acquired absence of right great toe: Secondary | ICD-10-CM | POA: Diagnosis not present

## 2020-09-15 DIAGNOSIS — M130 Polyarthritis, unspecified: Secondary | ICD-10-CM | POA: Diagnosis not present

## 2020-09-16 DIAGNOSIS — I10 Essential (primary) hypertension: Secondary | ICD-10-CM | POA: Diagnosis not present

## 2020-09-16 DIAGNOSIS — E1142 Type 2 diabetes mellitus with diabetic polyneuropathy: Secondary | ICD-10-CM | POA: Diagnosis not present

## 2020-09-16 DIAGNOSIS — E7849 Other hyperlipidemia: Secondary | ICD-10-CM | POA: Diagnosis not present

## 2020-09-17 DIAGNOSIS — I110 Hypertensive heart disease with heart failure: Secondary | ICD-10-CM | POA: Diagnosis not present

## 2020-09-17 DIAGNOSIS — E1140 Type 2 diabetes mellitus with diabetic neuropathy, unspecified: Secondary | ICD-10-CM | POA: Diagnosis not present

## 2020-09-17 DIAGNOSIS — I5022 Chronic systolic (congestive) heart failure: Secondary | ICD-10-CM | POA: Diagnosis not present

## 2020-09-17 DIAGNOSIS — I2510 Atherosclerotic heart disease of native coronary artery without angina pectoris: Secondary | ICD-10-CM | POA: Diagnosis not present

## 2020-09-17 DIAGNOSIS — Z8631 Personal history of diabetic foot ulcer: Secondary | ICD-10-CM | POA: Diagnosis not present

## 2020-09-17 DIAGNOSIS — E1151 Type 2 diabetes mellitus with diabetic peripheral angiopathy without gangrene: Secondary | ICD-10-CM | POA: Diagnosis not present

## 2020-09-18 ENCOUNTER — Other Ambulatory Visit: Payer: Self-pay

## 2020-09-18 ENCOUNTER — Ambulatory Visit (HOSPITAL_COMMUNITY)
Admission: RE | Admit: 2020-09-18 | Discharge: 2020-09-18 | Disposition: A | Discharge status: Home / Self Care | Payer: Medicare Other | Source: Ambulatory Visit | Attending: Neurology | Admitting: Neurology

## 2020-09-18 DIAGNOSIS — M545 Low back pain, unspecified: Secondary | ICD-10-CM

## 2020-09-18 DIAGNOSIS — E559 Vitamin D deficiency, unspecified: Secondary | ICD-10-CM | POA: Diagnosis not present

## 2020-09-18 DIAGNOSIS — Z79899 Other long term (current) drug therapy: Secondary | ICD-10-CM | POA: Diagnosis not present

## 2020-09-22 DIAGNOSIS — I5022 Chronic systolic (congestive) heart failure: Secondary | ICD-10-CM | POA: Diagnosis not present

## 2020-09-22 DIAGNOSIS — I2510 Atherosclerotic heart disease of native coronary artery without angina pectoris: Secondary | ICD-10-CM | POA: Diagnosis not present

## 2020-09-22 DIAGNOSIS — I110 Hypertensive heart disease with heart failure: Secondary | ICD-10-CM | POA: Diagnosis not present

## 2020-09-22 DIAGNOSIS — E1140 Type 2 diabetes mellitus with diabetic neuropathy, unspecified: Secondary | ICD-10-CM | POA: Diagnosis not present

## 2020-09-22 DIAGNOSIS — E1151 Type 2 diabetes mellitus with diabetic peripheral angiopathy without gangrene: Secondary | ICD-10-CM | POA: Diagnosis not present

## 2020-09-22 DIAGNOSIS — Z8631 Personal history of diabetic foot ulcer: Secondary | ICD-10-CM | POA: Diagnosis not present

## 2020-09-24 DIAGNOSIS — I5022 Chronic systolic (congestive) heart failure: Secondary | ICD-10-CM | POA: Diagnosis not present

## 2020-09-24 DIAGNOSIS — I110 Hypertensive heart disease with heart failure: Secondary | ICD-10-CM | POA: Diagnosis not present

## 2020-09-24 DIAGNOSIS — I2510 Atherosclerotic heart disease of native coronary artery without angina pectoris: Secondary | ICD-10-CM | POA: Diagnosis not present

## 2020-09-24 DIAGNOSIS — Z8631 Personal history of diabetic foot ulcer: Secondary | ICD-10-CM | POA: Diagnosis not present

## 2020-09-24 DIAGNOSIS — E1151 Type 2 diabetes mellitus with diabetic peripheral angiopathy without gangrene: Secondary | ICD-10-CM | POA: Diagnosis not present

## 2020-09-24 DIAGNOSIS — E1140 Type 2 diabetes mellitus with diabetic neuropathy, unspecified: Secondary | ICD-10-CM | POA: Diagnosis not present

## 2020-09-29 DIAGNOSIS — Z8631 Personal history of diabetic foot ulcer: Secondary | ICD-10-CM | POA: Diagnosis not present

## 2020-09-29 DIAGNOSIS — I110 Hypertensive heart disease with heart failure: Secondary | ICD-10-CM | POA: Diagnosis not present

## 2020-09-29 DIAGNOSIS — E1140 Type 2 diabetes mellitus with diabetic neuropathy, unspecified: Secondary | ICD-10-CM | POA: Diagnosis not present

## 2020-09-29 DIAGNOSIS — I5022 Chronic systolic (congestive) heart failure: Secondary | ICD-10-CM | POA: Diagnosis not present

## 2020-09-29 DIAGNOSIS — E1151 Type 2 diabetes mellitus with diabetic peripheral angiopathy without gangrene: Secondary | ICD-10-CM | POA: Diagnosis not present

## 2020-09-29 DIAGNOSIS — I2510 Atherosclerotic heart disease of native coronary artery without angina pectoris: Secondary | ICD-10-CM | POA: Diagnosis not present

## 2020-10-01 DIAGNOSIS — I5022 Chronic systolic (congestive) heart failure: Secondary | ICD-10-CM | POA: Diagnosis not present

## 2020-10-01 DIAGNOSIS — I2510 Atherosclerotic heart disease of native coronary artery without angina pectoris: Secondary | ICD-10-CM | POA: Diagnosis not present

## 2020-10-01 DIAGNOSIS — Z8631 Personal history of diabetic foot ulcer: Secondary | ICD-10-CM | POA: Diagnosis not present

## 2020-10-01 DIAGNOSIS — E1140 Type 2 diabetes mellitus with diabetic neuropathy, unspecified: Secondary | ICD-10-CM | POA: Diagnosis not present

## 2020-10-01 DIAGNOSIS — E1151 Type 2 diabetes mellitus with diabetic peripheral angiopathy without gangrene: Secondary | ICD-10-CM | POA: Diagnosis not present

## 2020-10-01 DIAGNOSIS — I110 Hypertensive heart disease with heart failure: Secondary | ICD-10-CM | POA: Diagnosis not present

## 2020-10-05 DIAGNOSIS — Z8631 Personal history of diabetic foot ulcer: Secondary | ICD-10-CM | POA: Diagnosis not present

## 2020-10-05 DIAGNOSIS — E1140 Type 2 diabetes mellitus with diabetic neuropathy, unspecified: Secondary | ICD-10-CM | POA: Diagnosis not present

## 2020-10-05 DIAGNOSIS — I5022 Chronic systolic (congestive) heart failure: Secondary | ICD-10-CM | POA: Diagnosis not present

## 2020-10-05 DIAGNOSIS — I110 Hypertensive heart disease with heart failure: Secondary | ICD-10-CM | POA: Diagnosis not present

## 2020-10-05 DIAGNOSIS — E1151 Type 2 diabetes mellitus with diabetic peripheral angiopathy without gangrene: Secondary | ICD-10-CM | POA: Diagnosis not present

## 2020-10-05 DIAGNOSIS — I2510 Atherosclerotic heart disease of native coronary artery without angina pectoris: Secondary | ICD-10-CM | POA: Diagnosis not present

## 2020-10-08 DIAGNOSIS — I5022 Chronic systolic (congestive) heart failure: Secondary | ICD-10-CM | POA: Diagnosis not present

## 2020-10-08 DIAGNOSIS — E1140 Type 2 diabetes mellitus with diabetic neuropathy, unspecified: Secondary | ICD-10-CM | POA: Diagnosis not present

## 2020-10-08 DIAGNOSIS — Z8631 Personal history of diabetic foot ulcer: Secondary | ICD-10-CM | POA: Diagnosis not present

## 2020-10-08 DIAGNOSIS — E1151 Type 2 diabetes mellitus with diabetic peripheral angiopathy without gangrene: Secondary | ICD-10-CM | POA: Diagnosis not present

## 2020-10-08 DIAGNOSIS — I2510 Atherosclerotic heart disease of native coronary artery without angina pectoris: Secondary | ICD-10-CM | POA: Diagnosis not present

## 2020-10-08 DIAGNOSIS — I110 Hypertensive heart disease with heart failure: Secondary | ICD-10-CM | POA: Diagnosis not present

## 2020-10-15 DIAGNOSIS — I2510 Atherosclerotic heart disease of native coronary artery without angina pectoris: Secondary | ICD-10-CM | POA: Diagnosis not present

## 2020-10-15 DIAGNOSIS — Z8631 Personal history of diabetic foot ulcer: Secondary | ICD-10-CM | POA: Diagnosis not present

## 2020-10-15 DIAGNOSIS — E1140 Type 2 diabetes mellitus with diabetic neuropathy, unspecified: Secondary | ICD-10-CM | POA: Diagnosis not present

## 2020-10-15 DIAGNOSIS — I110 Hypertensive heart disease with heart failure: Secondary | ICD-10-CM | POA: Diagnosis not present

## 2020-10-15 DIAGNOSIS — E1151 Type 2 diabetes mellitus with diabetic peripheral angiopathy without gangrene: Secondary | ICD-10-CM | POA: Diagnosis not present

## 2020-10-15 DIAGNOSIS — I5022 Chronic systolic (congestive) heart failure: Secondary | ICD-10-CM | POA: Diagnosis not present

## 2020-10-15 NOTE — Progress Notes (Signed)
MCKADE, GURKA ([id number]) Visit Report for 06/17/2020 Arrival Information Details Patient Name: Date of Service: [name] NDO [facility] 06/17/2020 3:45 PM Medical Record Number: [id number] Patient Account Number: [id number] Date of Birth/Sex: Treating RN: 26-Jul-1970 (50 y.o. [name] Primary Care Yarel Rushlow: Stoney Bang Other Clinician: Referring Ronnel Zuercher: Treating Francyne Arreaga/Extender: [place] Breeding in Treatment: 6 Visit Information History Since Last Visit Added or deleted any medications: No Patient Arrived: [name] Any new allergies or adverse reactions: No Arrival Time: 16:40 Had a fall or experienced change in No Accompanied By: self activities of daily living that may affect Transfer Assistance: None risk of falls: Patient Identification Verified: Yes Signs or symptoms of abuse/neglect since last visito No Secondary Verification Process Completed: Yes Hospitalized since last visit: No Patient Requires Transmission-Based Precautions: No Implantable device outside of the clinic excluding No Patient Has Alerts: No cellular tissue based products placed in the center since last visit: Has Dressing in Place as Prescribed: Yes Pain Present Now: No Electronic Signature(s) Signed: 06/18/2020 1:41:47 PM By: Sandre Kitty Entered By: Sandre Kitty on 06/17/2020 16:40:48 -------------------------------------------------------------------------------- Encounter Discharge Information Details Patient Name: Date of Service: [name], RA NDO [facility] 06/17/2020 3:45 PM Medical Record Number: [id number] Patient Account Number: [id number] Date of Birth/Sex: Treating RN: Aug 14, 1970 (50 y.o. Marcheta Grammes Primary Care Arleny Kruger: Stoney Bang Other Clinician: Referring Lasaundra Riche: Treating Gearlene Godsil/Extender: [place] Breeding in Treatment: 6 Encounter Discharge Information Items Post Procedure Vitals Discharge Condition:  Stable Temperature (F): 98.2 Ambulatory Status: Cane Pulse (bpm): 83 Discharge Destination: Home Respiratory Rate (breaths/min): 17 Transportation: Private Auto Blood Pressure (mmHg): 104/63 Schedule Follow-up Appointment: Yes Clinical Summary of Care: Provided on 06/17/2020 Form Type Recipient Paper Patient Patient Electronic Signature(s) Signed: 06/17/2020 5:28:31 PM By: Lorrin Jackson Entered By: Lorrin Jackson on 06/17/2020 17:28:31 -------------------------------------------------------------------------------- Lower Extremity Assessment Details Patient Name: Date of Service: [name], RA NDO [facility] 06/17/2020 3:45 PM Medical Record Number: [id number] Patient Account Number: [id number] Date of Birth/Sex: Treating RN: April 21, 1970 (49 y.o. Janyth Contes Primary Care Tanisa Lagace: Stoney Bang Other Clinician: Referring Dera Vanaken: Treating Coleta Grosshans/Extender: [place] Breeding in Treatment: 6 Edema Assessment Assessed: [Left: No] [Right: No] Edema: [Left: N] [Right: o] Calf Left: Right: Point of Measurement: 34 cm From Medial Instep 42.5 cm Ankle Left: Right: Point of Measurement: 13 cm From Medial Instep 24.5 cm Vascular Assessment Pulses: Dorsalis Pedis Palpable: [Right:Yes] Electronic Signature(s) Signed: 10/15/2020 4:51:55 PM By: Levan Hurst RN, BSN Entered By: Levan Hurst on 06/17/2020 16:48:05 -------------------------------------------------------------------------------- Goff Details Patient Name: Date of Service: [name], RA NDO [facility] 06/17/2020 3:45 PM Medical Record Number: [id number] Patient Account Number: [id number] Date of Birth/Sex: Treating RN: [birth year] (50 y.o. [name] Primary Care Senovia Gauer: Stoney Bang Other Clinician: Referring Andry Bogden: Treating Gray Maugeri/Extender: [place] Breeding in Treatment: 6 Multidisciplinary Care Plan reviewed with physician Active  Inactive Nutrition Nursing Diagnoses: Impaired glucose control: actual or potential Potential for alteratiion in Nutrition/Potential for imbalanced nutrition Goals: Patient/caregiver will maintain therapeutic glucose control Date Initiated: 05/13/2020 Target Resolution Date: 07/08/2020 Goal Status: Active Interventions: Assess patient nutrition upon admission and as needed per policy Treatment Activities: Patient referred to Primary Care Physician for further nutritional evaluation : 05/13/2020 Notes: Osteomyelitis Nursing Diagnoses: Infection: osteomyelitis Knowledge deficit related to disease process and management Goals: Patient's osteomyelitis will resolve Date Initiated: 05/13/2020 Target Resolution Date: 07/08/2020 Goal Status: Active Interventions: Assess for signs and symptoms of osteomyelitis  resolution every visit Provide education on osteomyelitis Treatment Activities: Systemic antibiotics : 05/13/2020 T ordered outside of clinic : 05/13/2020 est Notes: Wound/Skin Impairment Nursing Diagnoses: Impaired tissue integrity Knowledge deficit related to ulceration/compromised skin integrity Goals: Patient/caregiver will verbalize understanding of skin care regimen Date Initiated: 05/13/2020 Target Resolution Date: 07/08/2020 Goal Status: Active Ulcer/skin breakdown will have a volume reduction of 30% by week 4 Date Initiated: 05/13/2020 Date Inactivated: 06/10/2020 Target Resolution Date: 06/03/2020 Goal Status: Met Ulcer/skin breakdown will have a volume reduction of 50% by week 8 Date Initiated: 06/10/2020 Target Resolution Date: 07/08/2020 Goal Status: Active Interventions: Assess patient/caregiver ability to obtain necessary supplies Assess patient/caregiver ability to perform ulcer/skin care regimen upon admission and as needed Assess ulceration(s) every visit Provide education on ulcer and skin care Treatment Activities: Skin care regimen initiated :  05/13/2020 Topical wound management initiated : 05/13/2020 Notes: Electronic Signature(s) Signed: 06/17/2020 6:06:42 PM By: Baruch Gouty RN, BSN Entered By: Baruch Gouty on 06/17/2020 16:56:09 -------------------------------------------------------------------------------- Pain Assessment Details Patient Name: Date of Service: [name], RA NDO [facility] 06/17/2020 3:45 PM Medical Record Number: [id number] Patient Account Number: [id number] Date of Birth/Sex: Treating RN: 01-05-1971 (50 y.o. [name] Primary Care Ayven Glasco: Stoney Bang Other Clinician: Referring Junie Avilla: Treating Catelyn Friel/Extender: [place] Breeding in Treatment: 6 Active Problems Location of Pain Severity and Description of Pain Patient Has Paino No Site Locations Pain Management and Medication Current Pain Management: Electronic Signature(s) Signed: 06/17/2020 6:06:42 PM By: Baruch Gouty RN, BSN Signed: 06/18/2020 1:41:47 PM By: Sandre Kitty Entered By: Sandre Kitty on 06/17/2020 16:41:16 -------------------------------------------------------------------------------- Patient/Caregiver Education Details Patient Name: Date of Service: [name], [name] 6/1/2022andnbsp3:45 PM Medical Record Number: [id number] Patient Account Number: [id number] Date of Birth/Gender: Treating RN: Apr 20, 1970 (50 y.o. [name] Primary Care Physician: Stoney Bang Other Clinician: Referring Physician: Treating Physician/Extender: Dallas Breeding in Treatment: 6 Education Assessment Education Provided To: Patient Education Topics Provided Offloading: Methods: Explain/Verbal Responses: Reinforcements needed, State content correctly Wound/Skin Impairment: Methods: Explain/Verbal Responses: Reinforcements needed, State content correctly Electronic Signature(s) Signed: 06/17/2020 6:06:42 PM By: Baruch Gouty RN, BSN Entered By: Baruch Gouty on  06/17/2020 16:56:37 -------------------------------------------------------------------------------- Wound Assessment Details Patient Name: Date of Service: [name], RA NDO [facility] 06/17/2020 3:45 PM Medical Record Number: [id number] Patient Account Number: [id number] Date of Birth/Sex: Treating RN: Sep 15, 1970 (50 y.o. Janyth Contes Primary Care Jovann Luse: Stoney Bang Other Clinician: Referring Sani Madariaga: Treating Adalai Perl/Extender: [place] Breeding in Treatment: 6 Wound Status Wound Number: 3 Primary Diabetic Wound/Ulcer of the Lower Extremity Etiology: Wound Location: Right, Medial Foot Wound Open Wounding Event: Gradually Appeared Status: Date Acquired: 07/18/2019 Comorbid Sleep Apnea, Congestive Heart Failure, Hypertension, Type II Weeks Of Treatment: 6 History: Diabetes, Neuropathy Clustered Wound: No Photos Wound Measurements Length: (cm) 1.9 Width: (cm) 1.2 Depth: (cm) 0.6 Area: (cm) 1.791 Volume: (cm) 1.074 % Reduction in Area: 44.9% % Reduction in Volume: 58.7% Epithelialization: Small (1-33%) Tunneling: No Undermining: No Wound Description Classification: Grade 3 Wound Margin: Thickened Exudate Amount: Medium Exudate Type: Serosanguineous Exudate Color: red, brown Foul Odor After Cleansing: Yes Due to Product Use: No Slough/Fibrino Yes Wound Bed Granulation Amount: Small (1-33%) Exposed Structure Granulation Quality: Red, Pink Fascia Exposed: No Necrotic Amount: Large (67-100%) Fat Layer (Subcutaneous Tissue) Exposed: Yes Necrotic Quality: Eschar, Adherent Slough Tendon Exposed: No Muscle Exposed: No Joint Exposed: No Bone Exposed: No Electronic Signature(s) Signed: 06/18/2020 1:41:47 PM By: Sandre Kitty Signed: 10/15/2020 4:51:55 PM  By: Levan Hurst RN, BSN Entered By: Sandre Kitty on 06/18/2020 13:41:14 -------------------------------------------------------------------------------- Vitals Details Patient  Name: Date of Service: [name], RA NDO [facility] 06/17/2020 3:45 PM Medical Record Number: [id number] Patient Account Number: [id number] Date of Birth/Sex: Treating RN: 11/18/1970 (50 y.o. [name] Primary Care Reanne Nellums: Stoney Bang Other Clinician: Referring Lamarkus Nebel: Treating Tekeisha Hakim/Extender: [place] Breeding in Treatment: 6 Vital Signs Time Taken: 16:40 Temperature (F): 98.2 Height (in): 72 Pulse (bpm): 83 Weight (lbs): 304 Respiratory Rate (breaths/min): 17 Body Mass Index (BMI): 41.2 Blood Pressure (mmHg): 104/63 Reference Range: 80 - 120 mg / dl Electronic Signature(s) Signed: 06/18/2020 1:41:47 PM By: Sandre Kitty Entered By: Sandre Kitty on 06/17/2020 16:41:07

## 2020-10-16 DIAGNOSIS — E1142 Type 2 diabetes mellitus with diabetic polyneuropathy: Secondary | ICD-10-CM | POA: Diagnosis not present

## 2020-10-16 DIAGNOSIS — I10 Essential (primary) hypertension: Secondary | ICD-10-CM | POA: Diagnosis not present

## 2020-10-16 DIAGNOSIS — E7849 Other hyperlipidemia: Secondary | ICD-10-CM | POA: Diagnosis not present

## 2020-10-16 DIAGNOSIS — I5022 Chronic systolic (congestive) heart failure: Secondary | ICD-10-CM | POA: Diagnosis not present

## 2020-10-19 DIAGNOSIS — I2510 Atherosclerotic heart disease of native coronary artery without angina pectoris: Secondary | ICD-10-CM | POA: Diagnosis not present

## 2020-10-19 DIAGNOSIS — I5022 Chronic systolic (congestive) heart failure: Secondary | ICD-10-CM | POA: Diagnosis not present

## 2020-10-19 DIAGNOSIS — E11621 Type 2 diabetes mellitus with foot ulcer: Secondary | ICD-10-CM | POA: Diagnosis not present

## 2020-10-19 DIAGNOSIS — E1140 Type 2 diabetes mellitus with diabetic neuropathy, unspecified: Secondary | ICD-10-CM | POA: Diagnosis not present

## 2020-10-19 DIAGNOSIS — L97412 Non-pressure chronic ulcer of right heel and midfoot with fat layer exposed: Secondary | ICD-10-CM | POA: Diagnosis not present

## 2020-10-19 DIAGNOSIS — I110 Hypertensive heart disease with heart failure: Secondary | ICD-10-CM | POA: Diagnosis not present

## 2020-10-22 DIAGNOSIS — E11621 Type 2 diabetes mellitus with foot ulcer: Secondary | ICD-10-CM | POA: Diagnosis not present

## 2020-10-22 DIAGNOSIS — E1140 Type 2 diabetes mellitus with diabetic neuropathy, unspecified: Secondary | ICD-10-CM | POA: Diagnosis not present

## 2020-10-22 DIAGNOSIS — L97412 Non-pressure chronic ulcer of right heel and midfoot with fat layer exposed: Secondary | ICD-10-CM | POA: Diagnosis not present

## 2020-10-22 DIAGNOSIS — I110 Hypertensive heart disease with heart failure: Secondary | ICD-10-CM | POA: Diagnosis not present

## 2020-10-22 DIAGNOSIS — I2510 Atherosclerotic heart disease of native coronary artery without angina pectoris: Secondary | ICD-10-CM | POA: Diagnosis not present

## 2020-10-22 DIAGNOSIS — I5022 Chronic systolic (congestive) heart failure: Secondary | ICD-10-CM | POA: Diagnosis not present

## 2020-10-23 DIAGNOSIS — L97412 Non-pressure chronic ulcer of right heel and midfoot with fat layer exposed: Secondary | ICD-10-CM | POA: Diagnosis not present

## 2020-10-23 DIAGNOSIS — L97519 Non-pressure chronic ulcer of other part of right foot with unspecified severity: Secondary | ICD-10-CM | POA: Diagnosis not present

## 2020-10-23 DIAGNOSIS — E11621 Type 2 diabetes mellitus with foot ulcer: Secondary | ICD-10-CM | POA: Diagnosis not present

## 2020-10-26 DIAGNOSIS — E1140 Type 2 diabetes mellitus with diabetic neuropathy, unspecified: Secondary | ICD-10-CM | POA: Diagnosis not present

## 2020-10-26 DIAGNOSIS — I5022 Chronic systolic (congestive) heart failure: Secondary | ICD-10-CM | POA: Diagnosis not present

## 2020-10-26 DIAGNOSIS — I110 Hypertensive heart disease with heart failure: Secondary | ICD-10-CM | POA: Diagnosis not present

## 2020-10-26 DIAGNOSIS — E11621 Type 2 diabetes mellitus with foot ulcer: Secondary | ICD-10-CM | POA: Diagnosis not present

## 2020-10-26 DIAGNOSIS — I2510 Atherosclerotic heart disease of native coronary artery without angina pectoris: Secondary | ICD-10-CM | POA: Diagnosis not present

## 2020-10-26 DIAGNOSIS — L97412 Non-pressure chronic ulcer of right heel and midfoot with fat layer exposed: Secondary | ICD-10-CM | POA: Diagnosis not present

## 2020-10-29 DIAGNOSIS — I5022 Chronic systolic (congestive) heart failure: Secondary | ICD-10-CM | POA: Diagnosis not present

## 2020-10-29 DIAGNOSIS — E1140 Type 2 diabetes mellitus with diabetic neuropathy, unspecified: Secondary | ICD-10-CM | POA: Diagnosis not present

## 2020-10-29 DIAGNOSIS — I2510 Atherosclerotic heart disease of native coronary artery without angina pectoris: Secondary | ICD-10-CM | POA: Diagnosis not present

## 2020-10-29 DIAGNOSIS — L97412 Non-pressure chronic ulcer of right heel and midfoot with fat layer exposed: Secondary | ICD-10-CM | POA: Diagnosis not present

## 2020-10-29 DIAGNOSIS — E11621 Type 2 diabetes mellitus with foot ulcer: Secondary | ICD-10-CM | POA: Diagnosis not present

## 2020-10-29 DIAGNOSIS — I110 Hypertensive heart disease with heart failure: Secondary | ICD-10-CM | POA: Diagnosis not present

## 2020-10-30 DIAGNOSIS — L97519 Non-pressure chronic ulcer of other part of right foot with unspecified severity: Secondary | ICD-10-CM | POA: Diagnosis not present

## 2020-10-30 DIAGNOSIS — E11621 Type 2 diabetes mellitus with foot ulcer: Secondary | ICD-10-CM | POA: Diagnosis not present

## 2020-10-30 DIAGNOSIS — L97412 Non-pressure chronic ulcer of right heel and midfoot with fat layer exposed: Secondary | ICD-10-CM | POA: Diagnosis not present

## 2020-11-02 DIAGNOSIS — I2510 Atherosclerotic heart disease of native coronary artery without angina pectoris: Secondary | ICD-10-CM | POA: Diagnosis not present

## 2020-11-02 DIAGNOSIS — E1140 Type 2 diabetes mellitus with diabetic neuropathy, unspecified: Secondary | ICD-10-CM | POA: Diagnosis not present

## 2020-11-02 DIAGNOSIS — I110 Hypertensive heart disease with heart failure: Secondary | ICD-10-CM | POA: Diagnosis not present

## 2020-11-02 DIAGNOSIS — E11621 Type 2 diabetes mellitus with foot ulcer: Secondary | ICD-10-CM | POA: Diagnosis not present

## 2020-11-02 DIAGNOSIS — I5022 Chronic systolic (congestive) heart failure: Secondary | ICD-10-CM | POA: Diagnosis not present

## 2020-11-02 DIAGNOSIS — L97412 Non-pressure chronic ulcer of right heel and midfoot with fat layer exposed: Secondary | ICD-10-CM | POA: Diagnosis not present

## 2020-11-10 DIAGNOSIS — E1142 Type 2 diabetes mellitus with diabetic polyneuropathy: Secondary | ICD-10-CM | POA: Diagnosis not present

## 2020-11-10 DIAGNOSIS — I10 Essential (primary) hypertension: Secondary | ICD-10-CM | POA: Diagnosis not present

## 2020-11-10 DIAGNOSIS — M868X7 Other osteomyelitis, ankle and foot: Secondary | ICD-10-CM | POA: Diagnosis not present

## 2020-11-10 DIAGNOSIS — E7849 Other hyperlipidemia: Secondary | ICD-10-CM | POA: Diagnosis not present

## 2020-11-10 DIAGNOSIS — I5022 Chronic systolic (congestive) heart failure: Secondary | ICD-10-CM | POA: Diagnosis not present

## 2020-11-10 DIAGNOSIS — L84 Corns and callosities: Secondary | ICD-10-CM | POA: Diagnosis not present

## 2020-11-11 DIAGNOSIS — I110 Hypertensive heart disease with heart failure: Secondary | ICD-10-CM | POA: Diagnosis not present

## 2020-11-11 DIAGNOSIS — E11621 Type 2 diabetes mellitus with foot ulcer: Secondary | ICD-10-CM | POA: Diagnosis not present

## 2020-11-11 DIAGNOSIS — E1142 Type 2 diabetes mellitus with diabetic polyneuropathy: Secondary | ICD-10-CM | POA: Diagnosis not present

## 2020-11-11 DIAGNOSIS — Z89411 Acquired absence of right great toe: Secondary | ICD-10-CM | POA: Diagnosis not present

## 2020-11-11 DIAGNOSIS — L282 Other prurigo: Secondary | ICD-10-CM | POA: Diagnosis not present

## 2020-11-11 DIAGNOSIS — I5022 Chronic systolic (congestive) heart failure: Secondary | ICD-10-CM | POA: Diagnosis not present

## 2020-11-11 DIAGNOSIS — M545 Low back pain, unspecified: Secondary | ICD-10-CM | POA: Diagnosis not present

## 2020-11-11 DIAGNOSIS — Z79891 Long term (current) use of opiate analgesic: Secondary | ICD-10-CM | POA: Diagnosis not present

## 2020-11-11 DIAGNOSIS — I2510 Atherosclerotic heart disease of native coronary artery without angina pectoris: Secondary | ICD-10-CM | POA: Diagnosis not present

## 2020-11-11 DIAGNOSIS — L97412 Non-pressure chronic ulcer of right heel and midfoot with fat layer exposed: Secondary | ICD-10-CM | POA: Diagnosis not present

## 2020-11-11 DIAGNOSIS — M130 Polyarthritis, unspecified: Secondary | ICD-10-CM | POA: Diagnosis not present

## 2020-11-11 DIAGNOSIS — E1140 Type 2 diabetes mellitus with diabetic neuropathy, unspecified: Secondary | ICD-10-CM | POA: Diagnosis not present

## 2020-11-26 DIAGNOSIS — M79671 Pain in right foot: Secondary | ICD-10-CM | POA: Diagnosis not present

## 2020-11-26 DIAGNOSIS — M79674 Pain in right toe(s): Secondary | ICD-10-CM | POA: Diagnosis not present

## 2020-11-26 DIAGNOSIS — L89892 Pressure ulcer of other site, stage 2: Secondary | ICD-10-CM | POA: Diagnosis not present

## 2020-11-26 DIAGNOSIS — E1140 Type 2 diabetes mellitus with diabetic neuropathy, unspecified: Secondary | ICD-10-CM | POA: Diagnosis not present

## 2020-12-04 DIAGNOSIS — E1169 Type 2 diabetes mellitus with other specified complication: Secondary | ICD-10-CM | POA: Diagnosis not present

## 2020-12-04 DIAGNOSIS — L83 Acanthosis nigricans: Secondary | ICD-10-CM | POA: Diagnosis not present

## 2020-12-04 DIAGNOSIS — Z89411 Acquired absence of right great toe: Secondary | ICD-10-CM | POA: Diagnosis not present

## 2020-12-04 DIAGNOSIS — M7989 Other specified soft tissue disorders: Secondary | ICD-10-CM | POA: Diagnosis not present

## 2020-12-04 DIAGNOSIS — S92511K Displaced fracture of proximal phalanx of right lesser toe(s), subsequent encounter for fracture with nonunion: Secondary | ICD-10-CM | POA: Diagnosis not present

## 2020-12-04 DIAGNOSIS — M869 Osteomyelitis, unspecified: Secondary | ICD-10-CM | POA: Diagnosis not present

## 2020-12-04 DIAGNOSIS — I2510 Atherosclerotic heart disease of native coronary artery without angina pectoris: Secondary | ICD-10-CM | POA: Diagnosis not present

## 2020-12-04 DIAGNOSIS — L97519 Non-pressure chronic ulcer of other part of right foot with unspecified severity: Secondary | ICD-10-CM | POA: Diagnosis not present

## 2020-12-04 DIAGNOSIS — E1140 Type 2 diabetes mellitus with diabetic neuropathy, unspecified: Secondary | ICD-10-CM | POA: Diagnosis not present

## 2020-12-04 DIAGNOSIS — L97412 Non-pressure chronic ulcer of right heel and midfoot with fat layer exposed: Secondary | ICD-10-CM | POA: Diagnosis not present

## 2020-12-04 DIAGNOSIS — E11621 Type 2 diabetes mellitus with foot ulcer: Secondary | ICD-10-CM | POA: Diagnosis not present

## 2020-12-04 DIAGNOSIS — Z794 Long term (current) use of insulin: Secondary | ICD-10-CM | POA: Diagnosis not present

## 2020-12-04 DIAGNOSIS — M19071 Primary osteoarthritis, right ankle and foot: Secondary | ICD-10-CM | POA: Diagnosis not present

## 2020-12-04 DIAGNOSIS — Z79899 Other long term (current) drug therapy: Secondary | ICD-10-CM | POA: Diagnosis not present

## 2020-12-04 DIAGNOSIS — E7800 Pure hypercholesterolemia, unspecified: Secondary | ICD-10-CM | POA: Diagnosis not present

## 2020-12-04 DIAGNOSIS — I10 Essential (primary) hypertension: Secondary | ICD-10-CM | POA: Diagnosis not present

## 2020-12-04 DIAGNOSIS — Z7982 Long term (current) use of aspirin: Secondary | ICD-10-CM | POA: Diagnosis not present

## 2020-12-07 DIAGNOSIS — I5022 Chronic systolic (congestive) heart failure: Secondary | ICD-10-CM | POA: Diagnosis not present

## 2020-12-07 DIAGNOSIS — K625 Hemorrhage of anus and rectum: Secondary | ICD-10-CM | POA: Diagnosis not present

## 2020-12-07 DIAGNOSIS — Z794 Long term (current) use of insulin: Secondary | ICD-10-CM | POA: Diagnosis not present

## 2020-12-07 DIAGNOSIS — L97412 Non-pressure chronic ulcer of right heel and midfoot with fat layer exposed: Secondary | ICD-10-CM | POA: Diagnosis not present

## 2020-12-07 DIAGNOSIS — E11621 Type 2 diabetes mellitus with foot ulcer: Secondary | ICD-10-CM | POA: Diagnosis not present

## 2020-12-07 DIAGNOSIS — E119 Type 2 diabetes mellitus without complications: Secondary | ICD-10-CM | POA: Diagnosis not present

## 2020-12-09 DIAGNOSIS — E119 Type 2 diabetes mellitus without complications: Secondary | ICD-10-CM | POA: Diagnosis not present

## 2020-12-09 DIAGNOSIS — Z794 Long term (current) use of insulin: Secondary | ICD-10-CM | POA: Diagnosis not present

## 2020-12-09 DIAGNOSIS — K625 Hemorrhage of anus and rectum: Secondary | ICD-10-CM | POA: Diagnosis not present

## 2020-12-09 DIAGNOSIS — I5022 Chronic systolic (congestive) heart failure: Secondary | ICD-10-CM | POA: Diagnosis not present

## 2020-12-09 DIAGNOSIS — L97412 Non-pressure chronic ulcer of right heel and midfoot with fat layer exposed: Secondary | ICD-10-CM | POA: Diagnosis not present

## 2020-12-09 DIAGNOSIS — E11621 Type 2 diabetes mellitus with foot ulcer: Secondary | ICD-10-CM | POA: Diagnosis not present

## 2020-12-11 DIAGNOSIS — E11621 Type 2 diabetes mellitus with foot ulcer: Secondary | ICD-10-CM | POA: Diagnosis not present

## 2020-12-11 DIAGNOSIS — K625 Hemorrhage of anus and rectum: Secondary | ICD-10-CM | POA: Diagnosis not present

## 2020-12-11 DIAGNOSIS — Z794 Long term (current) use of insulin: Secondary | ICD-10-CM | POA: Diagnosis not present

## 2020-12-11 DIAGNOSIS — I5022 Chronic systolic (congestive) heart failure: Secondary | ICD-10-CM | POA: Diagnosis not present

## 2020-12-11 DIAGNOSIS — L97412 Non-pressure chronic ulcer of right heel and midfoot with fat layer exposed: Secondary | ICD-10-CM | POA: Diagnosis not present

## 2020-12-11 DIAGNOSIS — E119 Type 2 diabetes mellitus without complications: Secondary | ICD-10-CM | POA: Diagnosis not present

## 2020-12-15 DIAGNOSIS — Z794 Long term (current) use of insulin: Secondary | ICD-10-CM | POA: Diagnosis not present

## 2020-12-15 DIAGNOSIS — E11621 Type 2 diabetes mellitus with foot ulcer: Secondary | ICD-10-CM | POA: Diagnosis not present

## 2020-12-15 DIAGNOSIS — I5022 Chronic systolic (congestive) heart failure: Secondary | ICD-10-CM | POA: Diagnosis not present

## 2020-12-15 DIAGNOSIS — K625 Hemorrhage of anus and rectum: Secondary | ICD-10-CM | POA: Diagnosis not present

## 2020-12-15 DIAGNOSIS — L97412 Non-pressure chronic ulcer of right heel and midfoot with fat layer exposed: Secondary | ICD-10-CM | POA: Diagnosis not present

## 2020-12-15 DIAGNOSIS — E119 Type 2 diabetes mellitus without complications: Secondary | ICD-10-CM | POA: Diagnosis not present

## 2020-12-16 DIAGNOSIS — L97412 Non-pressure chronic ulcer of right heel and midfoot with fat layer exposed: Secondary | ICD-10-CM | POA: Diagnosis not present

## 2020-12-16 DIAGNOSIS — M869 Osteomyelitis, unspecified: Secondary | ICD-10-CM | POA: Diagnosis not present

## 2020-12-16 DIAGNOSIS — E1169 Type 2 diabetes mellitus with other specified complication: Secondary | ICD-10-CM | POA: Diagnosis not present

## 2020-12-16 DIAGNOSIS — B9562 Methicillin resistant Staphylococcus aureus infection as the cause of diseases classified elsewhere: Secondary | ICD-10-CM | POA: Diagnosis not present

## 2020-12-16 DIAGNOSIS — E11621 Type 2 diabetes mellitus with foot ulcer: Secondary | ICD-10-CM | POA: Diagnosis not present

## 2020-12-16 DIAGNOSIS — E1140 Type 2 diabetes mellitus with diabetic neuropathy, unspecified: Secondary | ICD-10-CM | POA: Diagnosis not present

## 2020-12-18 DIAGNOSIS — I10 Essential (primary) hypertension: Secondary | ICD-10-CM | POA: Diagnosis not present

## 2020-12-18 DIAGNOSIS — Z794 Long term (current) use of insulin: Secondary | ICD-10-CM | POA: Diagnosis not present

## 2020-12-18 DIAGNOSIS — Z79899 Other long term (current) drug therapy: Secondary | ICD-10-CM | POA: Diagnosis not present

## 2020-12-18 DIAGNOSIS — M869 Osteomyelitis, unspecified: Secondary | ICD-10-CM | POA: Diagnosis not present

## 2020-12-18 DIAGNOSIS — E11621 Type 2 diabetes mellitus with foot ulcer: Secondary | ICD-10-CM | POA: Diagnosis not present

## 2020-12-18 DIAGNOSIS — E1169 Type 2 diabetes mellitus with other specified complication: Secondary | ICD-10-CM | POA: Diagnosis not present

## 2020-12-18 DIAGNOSIS — Z7982 Long term (current) use of aspirin: Secondary | ICD-10-CM | POA: Diagnosis not present

## 2020-12-18 DIAGNOSIS — I2510 Atherosclerotic heart disease of native coronary artery without angina pectoris: Secondary | ICD-10-CM | POA: Diagnosis not present

## 2020-12-18 DIAGNOSIS — E1140 Type 2 diabetes mellitus with diabetic neuropathy, unspecified: Secondary | ICD-10-CM | POA: Diagnosis not present

## 2020-12-18 DIAGNOSIS — E7800 Pure hypercholesterolemia, unspecified: Secondary | ICD-10-CM | POA: Diagnosis not present

## 2020-12-18 DIAGNOSIS — L97412 Non-pressure chronic ulcer of right heel and midfoot with fat layer exposed: Secondary | ICD-10-CM | POA: Diagnosis not present

## 2020-12-21 DIAGNOSIS — M869 Osteomyelitis, unspecified: Secondary | ICD-10-CM | POA: Diagnosis not present

## 2020-12-21 DIAGNOSIS — E1140 Type 2 diabetes mellitus with diabetic neuropathy, unspecified: Secondary | ICD-10-CM | POA: Diagnosis not present

## 2020-12-21 DIAGNOSIS — L97412 Non-pressure chronic ulcer of right heel and midfoot with fat layer exposed: Secondary | ICD-10-CM | POA: Diagnosis not present

## 2020-12-21 DIAGNOSIS — E1169 Type 2 diabetes mellitus with other specified complication: Secondary | ICD-10-CM | POA: Diagnosis not present

## 2020-12-21 DIAGNOSIS — B9562 Methicillin resistant Staphylococcus aureus infection as the cause of diseases classified elsewhere: Secondary | ICD-10-CM | POA: Diagnosis not present

## 2020-12-21 DIAGNOSIS — E11621 Type 2 diabetes mellitus with foot ulcer: Secondary | ICD-10-CM | POA: Diagnosis not present

## 2020-12-23 DIAGNOSIS — B9562 Methicillin resistant Staphylococcus aureus infection as the cause of diseases classified elsewhere: Secondary | ICD-10-CM | POA: Diagnosis not present

## 2020-12-23 DIAGNOSIS — M869 Osteomyelitis, unspecified: Secondary | ICD-10-CM | POA: Diagnosis not present

## 2020-12-23 DIAGNOSIS — L97412 Non-pressure chronic ulcer of right heel and midfoot with fat layer exposed: Secondary | ICD-10-CM | POA: Diagnosis not present

## 2020-12-23 DIAGNOSIS — E1169 Type 2 diabetes mellitus with other specified complication: Secondary | ICD-10-CM | POA: Diagnosis not present

## 2020-12-23 DIAGNOSIS — E11621 Type 2 diabetes mellitus with foot ulcer: Secondary | ICD-10-CM | POA: Diagnosis not present

## 2020-12-23 DIAGNOSIS — E1140 Type 2 diabetes mellitus with diabetic neuropathy, unspecified: Secondary | ICD-10-CM | POA: Diagnosis not present

## 2020-12-24 DIAGNOSIS — L97519 Non-pressure chronic ulcer of other part of right foot with unspecified severity: Secondary | ICD-10-CM | POA: Diagnosis not present

## 2020-12-24 DIAGNOSIS — Z7982 Long term (current) use of aspirin: Secondary | ICD-10-CM | POA: Diagnosis not present

## 2020-12-24 DIAGNOSIS — E11621 Type 2 diabetes mellitus with foot ulcer: Secondary | ICD-10-CM | POA: Diagnosis not present

## 2020-12-24 DIAGNOSIS — I10 Essential (primary) hypertension: Secondary | ICD-10-CM | POA: Diagnosis not present

## 2020-12-24 DIAGNOSIS — E1140 Type 2 diabetes mellitus with diabetic neuropathy, unspecified: Secondary | ICD-10-CM | POA: Diagnosis not present

## 2020-12-24 DIAGNOSIS — L97512 Non-pressure chronic ulcer of other part of right foot with fat layer exposed: Secondary | ICD-10-CM | POA: Diagnosis not present

## 2020-12-24 DIAGNOSIS — Z794 Long term (current) use of insulin: Secondary | ICD-10-CM | POA: Diagnosis not present

## 2020-12-24 DIAGNOSIS — Z7984 Long term (current) use of oral hypoglycemic drugs: Secondary | ICD-10-CM | POA: Diagnosis not present

## 2020-12-24 DIAGNOSIS — Z79899 Other long term (current) drug therapy: Secondary | ICD-10-CM | POA: Diagnosis not present

## 2020-12-24 DIAGNOSIS — E1169 Type 2 diabetes mellitus with other specified complication: Secondary | ICD-10-CM | POA: Diagnosis not present

## 2020-12-24 DIAGNOSIS — I2510 Atherosclerotic heart disease of native coronary artery without angina pectoris: Secondary | ICD-10-CM | POA: Diagnosis not present

## 2020-12-24 DIAGNOSIS — G4730 Sleep apnea, unspecified: Secondary | ICD-10-CM | POA: Diagnosis not present

## 2020-12-24 DIAGNOSIS — E7800 Pure hypercholesterolemia, unspecified: Secondary | ICD-10-CM | POA: Diagnosis not present

## 2020-12-24 DIAGNOSIS — M869 Osteomyelitis, unspecified: Secondary | ICD-10-CM | POA: Diagnosis not present

## 2020-12-25 DIAGNOSIS — L97412 Non-pressure chronic ulcer of right heel and midfoot with fat layer exposed: Secondary | ICD-10-CM | POA: Diagnosis not present

## 2020-12-25 DIAGNOSIS — E1140 Type 2 diabetes mellitus with diabetic neuropathy, unspecified: Secondary | ICD-10-CM | POA: Diagnosis not present

## 2020-12-25 DIAGNOSIS — E11621 Type 2 diabetes mellitus with foot ulcer: Secondary | ICD-10-CM | POA: Diagnosis not present

## 2020-12-25 DIAGNOSIS — E1169 Type 2 diabetes mellitus with other specified complication: Secondary | ICD-10-CM | POA: Diagnosis not present

## 2020-12-25 DIAGNOSIS — M869 Osteomyelitis, unspecified: Secondary | ICD-10-CM | POA: Diagnosis not present

## 2020-12-25 DIAGNOSIS — B9562 Methicillin resistant Staphylococcus aureus infection as the cause of diseases classified elsewhere: Secondary | ICD-10-CM | POA: Diagnosis not present

## 2020-12-28 DIAGNOSIS — M869 Osteomyelitis, unspecified: Secondary | ICD-10-CM | POA: Diagnosis not present

## 2020-12-28 DIAGNOSIS — B9562 Methicillin resistant Staphylococcus aureus infection as the cause of diseases classified elsewhere: Secondary | ICD-10-CM | POA: Diagnosis not present

## 2020-12-28 DIAGNOSIS — L97412 Non-pressure chronic ulcer of right heel and midfoot with fat layer exposed: Secondary | ICD-10-CM | POA: Diagnosis not present

## 2020-12-28 DIAGNOSIS — E1140 Type 2 diabetes mellitus with diabetic neuropathy, unspecified: Secondary | ICD-10-CM | POA: Diagnosis not present

## 2020-12-28 DIAGNOSIS — E1169 Type 2 diabetes mellitus with other specified complication: Secondary | ICD-10-CM | POA: Diagnosis not present

## 2020-12-28 DIAGNOSIS — E11621 Type 2 diabetes mellitus with foot ulcer: Secondary | ICD-10-CM | POA: Diagnosis not present

## 2020-12-30 DIAGNOSIS — E11621 Type 2 diabetes mellitus with foot ulcer: Secondary | ICD-10-CM | POA: Diagnosis not present

## 2020-12-30 DIAGNOSIS — E1169 Type 2 diabetes mellitus with other specified complication: Secondary | ICD-10-CM | POA: Diagnosis not present

## 2020-12-30 DIAGNOSIS — E1140 Type 2 diabetes mellitus with diabetic neuropathy, unspecified: Secondary | ICD-10-CM | POA: Diagnosis not present

## 2020-12-30 DIAGNOSIS — B9562 Methicillin resistant Staphylococcus aureus infection as the cause of diseases classified elsewhere: Secondary | ICD-10-CM | POA: Diagnosis not present

## 2020-12-30 DIAGNOSIS — L97412 Non-pressure chronic ulcer of right heel and midfoot with fat layer exposed: Secondary | ICD-10-CM | POA: Diagnosis not present

## 2020-12-30 DIAGNOSIS — M869 Osteomyelitis, unspecified: Secondary | ICD-10-CM | POA: Diagnosis not present

## 2020-12-31 ENCOUNTER — Other Ambulatory Visit: Payer: Self-pay | Admitting: *Deleted

## 2020-12-31 MED ORDER — SACUBITRIL-VALSARTAN 49-51 MG PO TABS: 1.0000 | ORAL_TABLET | Freq: Two times a day (BID) | ORAL | 2 refills | Status: DC

## 2021-01-01 DIAGNOSIS — Z7982 Long term (current) use of aspirin: Secondary | ICD-10-CM | POA: Diagnosis not present

## 2021-01-01 DIAGNOSIS — M868X7 Other osteomyelitis, ankle and foot: Secondary | ICD-10-CM | POA: Diagnosis not present

## 2021-01-01 DIAGNOSIS — E1169 Type 2 diabetes mellitus with other specified complication: Secondary | ICD-10-CM | POA: Diagnosis not present

## 2021-01-01 DIAGNOSIS — Z7984 Long term (current) use of oral hypoglycemic drugs: Secondary | ICD-10-CM | POA: Diagnosis not present

## 2021-01-01 DIAGNOSIS — L97412 Non-pressure chronic ulcer of right heel and midfoot with fat layer exposed: Secondary | ICD-10-CM | POA: Diagnosis not present

## 2021-01-01 DIAGNOSIS — E11621 Type 2 diabetes mellitus with foot ulcer: Secondary | ICD-10-CM | POA: Diagnosis not present

## 2021-01-01 DIAGNOSIS — I2510 Atherosclerotic heart disease of native coronary artery without angina pectoris: Secondary | ICD-10-CM | POA: Diagnosis not present

## 2021-01-01 DIAGNOSIS — I10 Essential (primary) hypertension: Secondary | ICD-10-CM | POA: Diagnosis not present

## 2021-01-01 DIAGNOSIS — Z792 Long term (current) use of antibiotics: Secondary | ICD-10-CM | POA: Diagnosis not present

## 2021-01-01 DIAGNOSIS — Z794 Long term (current) use of insulin: Secondary | ICD-10-CM | POA: Diagnosis not present

## 2021-01-01 DIAGNOSIS — E1140 Type 2 diabetes mellitus with diabetic neuropathy, unspecified: Secondary | ICD-10-CM | POA: Diagnosis not present

## 2021-01-04 DIAGNOSIS — E1140 Type 2 diabetes mellitus with diabetic neuropathy, unspecified: Secondary | ICD-10-CM | POA: Diagnosis not present

## 2021-01-04 DIAGNOSIS — E1169 Type 2 diabetes mellitus with other specified complication: Secondary | ICD-10-CM | POA: Diagnosis not present

## 2021-01-04 DIAGNOSIS — M869 Osteomyelitis, unspecified: Secondary | ICD-10-CM | POA: Diagnosis not present

## 2021-01-04 DIAGNOSIS — B9562 Methicillin resistant Staphylococcus aureus infection as the cause of diseases classified elsewhere: Secondary | ICD-10-CM | POA: Diagnosis not present

## 2021-01-04 DIAGNOSIS — L97412 Non-pressure chronic ulcer of right heel and midfoot with fat layer exposed: Secondary | ICD-10-CM | POA: Diagnosis not present

## 2021-01-04 DIAGNOSIS — E11621 Type 2 diabetes mellitus with foot ulcer: Secondary | ICD-10-CM | POA: Diagnosis not present

## 2021-01-06 DIAGNOSIS — E11621 Type 2 diabetes mellitus with foot ulcer: Secondary | ICD-10-CM | POA: Diagnosis not present

## 2021-01-06 DIAGNOSIS — B9562 Methicillin resistant Staphylococcus aureus infection as the cause of diseases classified elsewhere: Secondary | ICD-10-CM | POA: Diagnosis not present

## 2021-01-06 DIAGNOSIS — M869 Osteomyelitis, unspecified: Secondary | ICD-10-CM | POA: Diagnosis not present

## 2021-01-06 DIAGNOSIS — E1169 Type 2 diabetes mellitus with other specified complication: Secondary | ICD-10-CM | POA: Diagnosis not present

## 2021-01-06 DIAGNOSIS — E1140 Type 2 diabetes mellitus with diabetic neuropathy, unspecified: Secondary | ICD-10-CM | POA: Diagnosis not present

## 2021-01-06 DIAGNOSIS — L97412 Non-pressure chronic ulcer of right heel and midfoot with fat layer exposed: Secondary | ICD-10-CM | POA: Diagnosis not present

## 2021-01-08 DIAGNOSIS — Z794 Long term (current) use of insulin: Secondary | ICD-10-CM | POA: Diagnosis not present

## 2021-01-08 DIAGNOSIS — L97412 Non-pressure chronic ulcer of right heel and midfoot with fat layer exposed: Secondary | ICD-10-CM | POA: Diagnosis not present

## 2021-01-08 DIAGNOSIS — E1140 Type 2 diabetes mellitus with diabetic neuropathy, unspecified: Secondary | ICD-10-CM | POA: Diagnosis not present

## 2021-01-08 DIAGNOSIS — I10 Essential (primary) hypertension: Secondary | ICD-10-CM | POA: Diagnosis not present

## 2021-01-08 DIAGNOSIS — Z7982 Long term (current) use of aspirin: Secondary | ICD-10-CM | POA: Diagnosis not present

## 2021-01-08 DIAGNOSIS — E1169 Type 2 diabetes mellitus with other specified complication: Secondary | ICD-10-CM | POA: Diagnosis not present

## 2021-01-08 DIAGNOSIS — Z7984 Long term (current) use of oral hypoglycemic drugs: Secondary | ICD-10-CM | POA: Diagnosis not present

## 2021-01-08 DIAGNOSIS — E11621 Type 2 diabetes mellitus with foot ulcer: Secondary | ICD-10-CM | POA: Diagnosis not present

## 2021-01-08 DIAGNOSIS — Z792 Long term (current) use of antibiotics: Secondary | ICD-10-CM | POA: Diagnosis not present

## 2021-01-08 DIAGNOSIS — I2510 Atherosclerotic heart disease of native coronary artery without angina pectoris: Secondary | ICD-10-CM | POA: Diagnosis not present

## 2021-01-08 DIAGNOSIS — M868X7 Other osteomyelitis, ankle and foot: Secondary | ICD-10-CM | POA: Diagnosis not present

## 2021-01-12 DIAGNOSIS — M130 Polyarthritis, unspecified: Secondary | ICD-10-CM | POA: Diagnosis not present

## 2021-01-12 DIAGNOSIS — B9562 Methicillin resistant Staphylococcus aureus infection as the cause of diseases classified elsewhere: Secondary | ICD-10-CM | POA: Diagnosis not present

## 2021-01-12 DIAGNOSIS — E1142 Type 2 diabetes mellitus with diabetic polyneuropathy: Secondary | ICD-10-CM | POA: Diagnosis not present

## 2021-01-12 DIAGNOSIS — L282 Other prurigo: Secondary | ICD-10-CM | POA: Diagnosis not present

## 2021-01-12 DIAGNOSIS — M869 Osteomyelitis, unspecified: Secondary | ICD-10-CM | POA: Diagnosis not present

## 2021-01-12 DIAGNOSIS — Z79891 Long term (current) use of opiate analgesic: Secondary | ICD-10-CM | POA: Diagnosis not present

## 2021-01-12 DIAGNOSIS — E1140 Type 2 diabetes mellitus with diabetic neuropathy, unspecified: Secondary | ICD-10-CM | POA: Diagnosis not present

## 2021-01-12 DIAGNOSIS — L97412 Non-pressure chronic ulcer of right heel and midfoot with fat layer exposed: Secondary | ICD-10-CM | POA: Diagnosis not present

## 2021-01-12 DIAGNOSIS — E11621 Type 2 diabetes mellitus with foot ulcer: Secondary | ICD-10-CM | POA: Diagnosis not present

## 2021-01-12 DIAGNOSIS — M545 Low back pain, unspecified: Secondary | ICD-10-CM | POA: Diagnosis not present

## 2021-01-12 DIAGNOSIS — Z89411 Acquired absence of right great toe: Secondary | ICD-10-CM | POA: Diagnosis not present

## 2021-01-12 DIAGNOSIS — E1169 Type 2 diabetes mellitus with other specified complication: Secondary | ICD-10-CM | POA: Diagnosis not present

## 2021-01-14 DIAGNOSIS — E1140 Type 2 diabetes mellitus with diabetic neuropathy, unspecified: Secondary | ICD-10-CM | POA: Diagnosis not present

## 2021-01-14 DIAGNOSIS — E11621 Type 2 diabetes mellitus with foot ulcer: Secondary | ICD-10-CM | POA: Diagnosis not present

## 2021-01-14 DIAGNOSIS — E1169 Type 2 diabetes mellitus with other specified complication: Secondary | ICD-10-CM | POA: Diagnosis not present

## 2021-01-14 DIAGNOSIS — B9562 Methicillin resistant Staphylococcus aureus infection as the cause of diseases classified elsewhere: Secondary | ICD-10-CM | POA: Diagnosis not present

## 2021-01-14 DIAGNOSIS — M869 Osteomyelitis, unspecified: Secondary | ICD-10-CM | POA: Diagnosis not present

## 2021-01-14 DIAGNOSIS — L97412 Non-pressure chronic ulcer of right heel and midfoot with fat layer exposed: Secondary | ICD-10-CM | POA: Diagnosis not present

## 2021-01-19 DIAGNOSIS — E11621 Type 2 diabetes mellitus with foot ulcer: Secondary | ICD-10-CM | POA: Diagnosis not present

## 2021-01-19 DIAGNOSIS — L97412 Non-pressure chronic ulcer of right heel and midfoot with fat layer exposed: Secondary | ICD-10-CM | POA: Diagnosis not present

## 2021-01-19 DIAGNOSIS — E1169 Type 2 diabetes mellitus with other specified complication: Secondary | ICD-10-CM | POA: Diagnosis not present

## 2021-01-19 DIAGNOSIS — M869 Osteomyelitis, unspecified: Secondary | ICD-10-CM | POA: Diagnosis not present

## 2021-01-19 DIAGNOSIS — E1140 Type 2 diabetes mellitus with diabetic neuropathy, unspecified: Secondary | ICD-10-CM | POA: Diagnosis not present

## 2021-01-19 DIAGNOSIS — B9562 Methicillin resistant Staphylococcus aureus infection as the cause of diseases classified elsewhere: Secondary | ICD-10-CM | POA: Diagnosis not present

## 2021-01-21 DIAGNOSIS — E1169 Type 2 diabetes mellitus with other specified complication: Secondary | ICD-10-CM | POA: Diagnosis not present

## 2021-01-21 DIAGNOSIS — E1140 Type 2 diabetes mellitus with diabetic neuropathy, unspecified: Secondary | ICD-10-CM | POA: Diagnosis not present

## 2021-01-21 DIAGNOSIS — E11621 Type 2 diabetes mellitus with foot ulcer: Secondary | ICD-10-CM | POA: Diagnosis not present

## 2021-01-21 DIAGNOSIS — B9562 Methicillin resistant Staphylococcus aureus infection as the cause of diseases classified elsewhere: Secondary | ICD-10-CM | POA: Diagnosis not present

## 2021-01-21 DIAGNOSIS — M869 Osteomyelitis, unspecified: Secondary | ICD-10-CM | POA: Diagnosis not present

## 2021-01-21 DIAGNOSIS — L97412 Non-pressure chronic ulcer of right heel and midfoot with fat layer exposed: Secondary | ICD-10-CM | POA: Diagnosis not present

## 2021-01-22 DIAGNOSIS — I10 Essential (primary) hypertension: Secondary | ICD-10-CM | POA: Diagnosis not present

## 2021-01-22 DIAGNOSIS — L97519 Non-pressure chronic ulcer of other part of right foot with unspecified severity: Secondary | ICD-10-CM | POA: Diagnosis not present

## 2021-01-22 DIAGNOSIS — Z7982 Long term (current) use of aspirin: Secondary | ICD-10-CM | POA: Diagnosis not present

## 2021-01-22 DIAGNOSIS — Z792 Long term (current) use of antibiotics: Secondary | ICD-10-CM | POA: Diagnosis not present

## 2021-01-22 DIAGNOSIS — Z7984 Long term (current) use of oral hypoglycemic drugs: Secondary | ICD-10-CM | POA: Diagnosis not present

## 2021-01-22 DIAGNOSIS — E7800 Pure hypercholesterolemia, unspecified: Secondary | ICD-10-CM | POA: Diagnosis not present

## 2021-01-22 DIAGNOSIS — I2510 Atherosclerotic heart disease of native coronary artery without angina pectoris: Secondary | ICD-10-CM | POA: Diagnosis not present

## 2021-01-22 DIAGNOSIS — L97502 Non-pressure chronic ulcer of other part of unspecified foot with fat layer exposed: Secondary | ICD-10-CM | POA: Diagnosis not present

## 2021-01-22 DIAGNOSIS — E11621 Type 2 diabetes mellitus with foot ulcer: Secondary | ICD-10-CM | POA: Diagnosis not present

## 2021-01-22 DIAGNOSIS — E1140 Type 2 diabetes mellitus with diabetic neuropathy, unspecified: Secondary | ICD-10-CM | POA: Diagnosis not present

## 2021-01-22 DIAGNOSIS — L97412 Non-pressure chronic ulcer of right heel and midfoot with fat layer exposed: Secondary | ICD-10-CM | POA: Diagnosis not present

## 2021-01-22 DIAGNOSIS — M869 Osteomyelitis, unspecified: Secondary | ICD-10-CM | POA: Diagnosis not present

## 2021-01-26 DIAGNOSIS — B9562 Methicillin resistant Staphylococcus aureus infection as the cause of diseases classified elsewhere: Secondary | ICD-10-CM | POA: Diagnosis not present

## 2021-01-26 DIAGNOSIS — E1169 Type 2 diabetes mellitus with other specified complication: Secondary | ICD-10-CM | POA: Diagnosis not present

## 2021-01-26 DIAGNOSIS — M869 Osteomyelitis, unspecified: Secondary | ICD-10-CM | POA: Diagnosis not present

## 2021-01-26 DIAGNOSIS — L97412 Non-pressure chronic ulcer of right heel and midfoot with fat layer exposed: Secondary | ICD-10-CM | POA: Diagnosis not present

## 2021-01-26 DIAGNOSIS — E1140 Type 2 diabetes mellitus with diabetic neuropathy, unspecified: Secondary | ICD-10-CM | POA: Diagnosis not present

## 2021-01-26 DIAGNOSIS — E11621 Type 2 diabetes mellitus with foot ulcer: Secondary | ICD-10-CM | POA: Diagnosis not present

## 2021-01-28 DIAGNOSIS — E1140 Type 2 diabetes mellitus with diabetic neuropathy, unspecified: Secondary | ICD-10-CM | POA: Diagnosis not present

## 2021-01-28 DIAGNOSIS — L97412 Non-pressure chronic ulcer of right heel and midfoot with fat layer exposed: Secondary | ICD-10-CM | POA: Diagnosis not present

## 2021-01-28 DIAGNOSIS — B9562 Methicillin resistant Staphylococcus aureus infection as the cause of diseases classified elsewhere: Secondary | ICD-10-CM | POA: Diagnosis not present

## 2021-01-28 DIAGNOSIS — E1169 Type 2 diabetes mellitus with other specified complication: Secondary | ICD-10-CM | POA: Diagnosis not present

## 2021-01-28 DIAGNOSIS — M869 Osteomyelitis, unspecified: Secondary | ICD-10-CM | POA: Diagnosis not present

## 2021-01-28 DIAGNOSIS — E11621 Type 2 diabetes mellitus with foot ulcer: Secondary | ICD-10-CM | POA: Diagnosis not present

## 2021-01-29 DIAGNOSIS — Z792 Long term (current) use of antibiotics: Secondary | ICD-10-CM | POA: Diagnosis not present

## 2021-01-29 DIAGNOSIS — L97502 Non-pressure chronic ulcer of other part of unspecified foot with fat layer exposed: Secondary | ICD-10-CM | POA: Diagnosis not present

## 2021-01-29 DIAGNOSIS — L97519 Non-pressure chronic ulcer of other part of right foot with unspecified severity: Secondary | ICD-10-CM | POA: Diagnosis not present

## 2021-01-29 DIAGNOSIS — E1140 Type 2 diabetes mellitus with diabetic neuropathy, unspecified: Secondary | ICD-10-CM | POA: Diagnosis not present

## 2021-01-29 DIAGNOSIS — I2510 Atherosclerotic heart disease of native coronary artery without angina pectoris: Secondary | ICD-10-CM | POA: Diagnosis not present

## 2021-01-29 DIAGNOSIS — M869 Osteomyelitis, unspecified: Secondary | ICD-10-CM | POA: Diagnosis not present

## 2021-01-29 DIAGNOSIS — Z7984 Long term (current) use of oral hypoglycemic drugs: Secondary | ICD-10-CM | POA: Diagnosis not present

## 2021-01-29 DIAGNOSIS — E11621 Type 2 diabetes mellitus with foot ulcer: Secondary | ICD-10-CM | POA: Diagnosis not present

## 2021-01-29 DIAGNOSIS — I10 Essential (primary) hypertension: Secondary | ICD-10-CM | POA: Diagnosis not present

## 2021-01-29 DIAGNOSIS — Z7982 Long term (current) use of aspirin: Secondary | ICD-10-CM | POA: Diagnosis not present

## 2021-01-29 DIAGNOSIS — L97412 Non-pressure chronic ulcer of right heel and midfoot with fat layer exposed: Secondary | ICD-10-CM | POA: Diagnosis not present

## 2021-01-29 DIAGNOSIS — E7800 Pure hypercholesterolemia, unspecified: Secondary | ICD-10-CM | POA: Diagnosis not present

## 2021-02-01 DIAGNOSIS — E11621 Type 2 diabetes mellitus with foot ulcer: Secondary | ICD-10-CM | POA: Diagnosis not present

## 2021-02-01 DIAGNOSIS — M869 Osteomyelitis, unspecified: Secondary | ICD-10-CM | POA: Diagnosis not present

## 2021-02-01 DIAGNOSIS — E1140 Type 2 diabetes mellitus with diabetic neuropathy, unspecified: Secondary | ICD-10-CM | POA: Diagnosis not present

## 2021-02-01 DIAGNOSIS — B9562 Methicillin resistant Staphylococcus aureus infection as the cause of diseases classified elsewhere: Secondary | ICD-10-CM | POA: Diagnosis not present

## 2021-02-01 DIAGNOSIS — E1169 Type 2 diabetes mellitus with other specified complication: Secondary | ICD-10-CM | POA: Diagnosis not present

## 2021-02-01 DIAGNOSIS — L97412 Non-pressure chronic ulcer of right heel and midfoot with fat layer exposed: Secondary | ICD-10-CM | POA: Diagnosis not present

## 2021-02-03 DIAGNOSIS — E1140 Type 2 diabetes mellitus with diabetic neuropathy, unspecified: Secondary | ICD-10-CM | POA: Diagnosis not present

## 2021-02-03 DIAGNOSIS — E1169 Type 2 diabetes mellitus with other specified complication: Secondary | ICD-10-CM | POA: Diagnosis not present

## 2021-02-03 DIAGNOSIS — B9562 Methicillin resistant Staphylococcus aureus infection as the cause of diseases classified elsewhere: Secondary | ICD-10-CM | POA: Diagnosis not present

## 2021-02-03 DIAGNOSIS — E11621 Type 2 diabetes mellitus with foot ulcer: Secondary | ICD-10-CM | POA: Diagnosis not present

## 2021-02-03 DIAGNOSIS — M869 Osteomyelitis, unspecified: Secondary | ICD-10-CM | POA: Diagnosis not present

## 2021-02-03 DIAGNOSIS — L97412 Non-pressure chronic ulcer of right heel and midfoot with fat layer exposed: Secondary | ICD-10-CM | POA: Diagnosis not present

## 2021-02-05 DIAGNOSIS — E7800 Pure hypercholesterolemia, unspecified: Secondary | ICD-10-CM | POA: Diagnosis not present

## 2021-02-05 DIAGNOSIS — I10 Essential (primary) hypertension: Secondary | ICD-10-CM | POA: Diagnosis not present

## 2021-02-05 DIAGNOSIS — M869 Osteomyelitis, unspecified: Secondary | ICD-10-CM | POA: Diagnosis not present

## 2021-02-05 DIAGNOSIS — L97502 Non-pressure chronic ulcer of other part of unspecified foot with fat layer exposed: Secondary | ICD-10-CM | POA: Diagnosis not present

## 2021-02-05 DIAGNOSIS — I2510 Atherosclerotic heart disease of native coronary artery without angina pectoris: Secondary | ICD-10-CM | POA: Diagnosis not present

## 2021-02-05 DIAGNOSIS — Z7984 Long term (current) use of oral hypoglycemic drugs: Secondary | ICD-10-CM | POA: Diagnosis not present

## 2021-02-05 DIAGNOSIS — Z792 Long term (current) use of antibiotics: Secondary | ICD-10-CM | POA: Diagnosis not present

## 2021-02-05 DIAGNOSIS — E11621 Type 2 diabetes mellitus with foot ulcer: Secondary | ICD-10-CM | POA: Diagnosis not present

## 2021-02-05 DIAGNOSIS — L97412 Non-pressure chronic ulcer of right heel and midfoot with fat layer exposed: Secondary | ICD-10-CM | POA: Diagnosis not present

## 2021-02-05 DIAGNOSIS — Z7982 Long term (current) use of aspirin: Secondary | ICD-10-CM | POA: Diagnosis not present

## 2021-02-05 DIAGNOSIS — L97519 Non-pressure chronic ulcer of other part of right foot with unspecified severity: Secondary | ICD-10-CM | POA: Diagnosis not present

## 2021-02-05 DIAGNOSIS — E1140 Type 2 diabetes mellitus with diabetic neuropathy, unspecified: Secondary | ICD-10-CM | POA: Diagnosis not present

## 2021-02-08 DIAGNOSIS — B9562 Methicillin resistant Staphylococcus aureus infection as the cause of diseases classified elsewhere: Secondary | ICD-10-CM | POA: Diagnosis not present

## 2021-02-08 DIAGNOSIS — M869 Osteomyelitis, unspecified: Secondary | ICD-10-CM | POA: Diagnosis not present

## 2021-02-08 DIAGNOSIS — E11621 Type 2 diabetes mellitus with foot ulcer: Secondary | ICD-10-CM | POA: Diagnosis not present

## 2021-02-08 DIAGNOSIS — E1140 Type 2 diabetes mellitus with diabetic neuropathy, unspecified: Secondary | ICD-10-CM | POA: Diagnosis not present

## 2021-02-08 DIAGNOSIS — E1169 Type 2 diabetes mellitus with other specified complication: Secondary | ICD-10-CM | POA: Diagnosis not present

## 2021-02-08 DIAGNOSIS — L97412 Non-pressure chronic ulcer of right heel and midfoot with fat layer exposed: Secondary | ICD-10-CM | POA: Diagnosis not present

## 2021-02-10 DIAGNOSIS — E11621 Type 2 diabetes mellitus with foot ulcer: Secondary | ICD-10-CM | POA: Diagnosis not present

## 2021-02-10 DIAGNOSIS — E1169 Type 2 diabetes mellitus with other specified complication: Secondary | ICD-10-CM | POA: Diagnosis not present

## 2021-02-10 DIAGNOSIS — L97412 Non-pressure chronic ulcer of right heel and midfoot with fat layer exposed: Secondary | ICD-10-CM | POA: Diagnosis not present

## 2021-02-10 DIAGNOSIS — M869 Osteomyelitis, unspecified: Secondary | ICD-10-CM | POA: Diagnosis not present

## 2021-02-10 DIAGNOSIS — E1140 Type 2 diabetes mellitus with diabetic neuropathy, unspecified: Secondary | ICD-10-CM | POA: Diagnosis not present

## 2021-02-10 DIAGNOSIS — B9562 Methicillin resistant Staphylococcus aureus infection as the cause of diseases classified elsewhere: Secondary | ICD-10-CM | POA: Diagnosis not present

## 2021-02-12 DIAGNOSIS — L97412 Non-pressure chronic ulcer of right heel and midfoot with fat layer exposed: Secondary | ICD-10-CM | POA: Diagnosis not present

## 2021-02-12 DIAGNOSIS — L97502 Non-pressure chronic ulcer of other part of unspecified foot with fat layer exposed: Secondary | ICD-10-CM | POA: Diagnosis not present

## 2021-02-12 DIAGNOSIS — E11621 Type 2 diabetes mellitus with foot ulcer: Secondary | ICD-10-CM | POA: Diagnosis not present

## 2021-02-12 DIAGNOSIS — M869 Osteomyelitis, unspecified: Secondary | ICD-10-CM | POA: Diagnosis not present

## 2021-02-12 DIAGNOSIS — E1140 Type 2 diabetes mellitus with diabetic neuropathy, unspecified: Secondary | ICD-10-CM | POA: Diagnosis not present

## 2021-02-12 DIAGNOSIS — L97519 Non-pressure chronic ulcer of other part of right foot with unspecified severity: Secondary | ICD-10-CM | POA: Diagnosis not present

## 2021-02-12 DIAGNOSIS — E7800 Pure hypercholesterolemia, unspecified: Secondary | ICD-10-CM | POA: Diagnosis not present

## 2021-02-12 DIAGNOSIS — Z792 Long term (current) use of antibiotics: Secondary | ICD-10-CM | POA: Diagnosis not present

## 2021-02-12 DIAGNOSIS — I10 Essential (primary) hypertension: Secondary | ICD-10-CM | POA: Diagnosis not present

## 2021-02-12 DIAGNOSIS — Z7982 Long term (current) use of aspirin: Secondary | ICD-10-CM | POA: Diagnosis not present

## 2021-02-12 DIAGNOSIS — I2510 Atherosclerotic heart disease of native coronary artery without angina pectoris: Secondary | ICD-10-CM | POA: Diagnosis not present

## 2021-02-12 DIAGNOSIS — Z7984 Long term (current) use of oral hypoglycemic drugs: Secondary | ICD-10-CM | POA: Diagnosis not present

## 2021-02-13 DIAGNOSIS — G4733 Obstructive sleep apnea (adult) (pediatric): Secondary | ICD-10-CM | POA: Diagnosis not present

## 2021-02-15 DIAGNOSIS — E1140 Type 2 diabetes mellitus with diabetic neuropathy, unspecified: Secondary | ICD-10-CM | POA: Diagnosis not present

## 2021-02-15 DIAGNOSIS — L97412 Non-pressure chronic ulcer of right heel and midfoot with fat layer exposed: Secondary | ICD-10-CM | POA: Diagnosis not present

## 2021-02-15 DIAGNOSIS — M869 Osteomyelitis, unspecified: Secondary | ICD-10-CM | POA: Diagnosis not present

## 2021-02-15 DIAGNOSIS — B9562 Methicillin resistant Staphylococcus aureus infection as the cause of diseases classified elsewhere: Secondary | ICD-10-CM | POA: Diagnosis not present

## 2021-02-15 DIAGNOSIS — E1169 Type 2 diabetes mellitus with other specified complication: Secondary | ICD-10-CM | POA: Diagnosis not present

## 2021-02-15 DIAGNOSIS — E11621 Type 2 diabetes mellitus with foot ulcer: Secondary | ICD-10-CM | POA: Diagnosis not present

## 2021-02-16 DIAGNOSIS — Z0000 Encounter for general adult medical examination without abnormal findings: Secondary | ICD-10-CM | POA: Diagnosis not present

## 2021-02-16 DIAGNOSIS — I5022 Chronic systolic (congestive) heart failure: Secondary | ICD-10-CM | POA: Diagnosis not present

## 2021-02-16 DIAGNOSIS — L84 Corns and callosities: Secondary | ICD-10-CM | POA: Diagnosis not present

## 2021-02-16 DIAGNOSIS — E7849 Other hyperlipidemia: Secondary | ICD-10-CM | POA: Diagnosis not present

## 2021-02-16 DIAGNOSIS — I10 Essential (primary) hypertension: Secondary | ICD-10-CM | POA: Diagnosis not present

## 2021-02-16 DIAGNOSIS — E1142 Type 2 diabetes mellitus with diabetic polyneuropathy: Secondary | ICD-10-CM | POA: Diagnosis not present

## 2021-02-16 DIAGNOSIS — M868X7 Other osteomyelitis, ankle and foot: Secondary | ICD-10-CM | POA: Diagnosis not present

## 2021-02-17 DIAGNOSIS — E1140 Type 2 diabetes mellitus with diabetic neuropathy, unspecified: Secondary | ICD-10-CM | POA: Diagnosis not present

## 2021-02-17 DIAGNOSIS — L97412 Non-pressure chronic ulcer of right heel and midfoot with fat layer exposed: Secondary | ICD-10-CM | POA: Diagnosis not present

## 2021-02-17 DIAGNOSIS — M869 Osteomyelitis, unspecified: Secondary | ICD-10-CM | POA: Diagnosis not present

## 2021-02-17 DIAGNOSIS — E1169 Type 2 diabetes mellitus with other specified complication: Secondary | ICD-10-CM | POA: Diagnosis not present

## 2021-02-17 DIAGNOSIS — E11621 Type 2 diabetes mellitus with foot ulcer: Secondary | ICD-10-CM | POA: Diagnosis not present

## 2021-02-17 DIAGNOSIS — B9562 Methicillin resistant Staphylococcus aureus infection as the cause of diseases classified elsewhere: Secondary | ICD-10-CM | POA: Diagnosis not present

## 2021-02-22 DIAGNOSIS — E11621 Type 2 diabetes mellitus with foot ulcer: Secondary | ICD-10-CM | POA: Diagnosis not present

## 2021-02-22 DIAGNOSIS — M869 Osteomyelitis, unspecified: Secondary | ICD-10-CM | POA: Diagnosis not present

## 2021-02-22 DIAGNOSIS — L97412 Non-pressure chronic ulcer of right heel and midfoot with fat layer exposed: Secondary | ICD-10-CM | POA: Diagnosis not present

## 2021-02-22 DIAGNOSIS — E1169 Type 2 diabetes mellitus with other specified complication: Secondary | ICD-10-CM | POA: Diagnosis not present

## 2021-02-22 DIAGNOSIS — E1140 Type 2 diabetes mellitus with diabetic neuropathy, unspecified: Secondary | ICD-10-CM | POA: Diagnosis not present

## 2021-02-22 DIAGNOSIS — B9562 Methicillin resistant Staphylococcus aureus infection as the cause of diseases classified elsewhere: Secondary | ICD-10-CM | POA: Diagnosis not present

## 2021-02-24 DIAGNOSIS — E1169 Type 2 diabetes mellitus with other specified complication: Secondary | ICD-10-CM | POA: Diagnosis not present

## 2021-02-24 DIAGNOSIS — E11621 Type 2 diabetes mellitus with foot ulcer: Secondary | ICD-10-CM | POA: Diagnosis not present

## 2021-02-24 DIAGNOSIS — M869 Osteomyelitis, unspecified: Secondary | ICD-10-CM | POA: Diagnosis not present

## 2021-02-24 DIAGNOSIS — B9562 Methicillin resistant Staphylococcus aureus infection as the cause of diseases classified elsewhere: Secondary | ICD-10-CM | POA: Diagnosis not present

## 2021-02-24 DIAGNOSIS — E1140 Type 2 diabetes mellitus with diabetic neuropathy, unspecified: Secondary | ICD-10-CM | POA: Diagnosis not present

## 2021-02-24 DIAGNOSIS — L97412 Non-pressure chronic ulcer of right heel and midfoot with fat layer exposed: Secondary | ICD-10-CM | POA: Diagnosis not present

## 2021-02-26 DIAGNOSIS — E1169 Type 2 diabetes mellitus with other specified complication: Secondary | ICD-10-CM | POA: Diagnosis not present

## 2021-02-26 DIAGNOSIS — Z794 Long term (current) use of insulin: Secondary | ICD-10-CM | POA: Diagnosis not present

## 2021-02-26 DIAGNOSIS — E11621 Type 2 diabetes mellitus with foot ulcer: Secondary | ICD-10-CM | POA: Diagnosis not present

## 2021-02-26 DIAGNOSIS — G4730 Sleep apnea, unspecified: Secondary | ICD-10-CM | POA: Diagnosis not present

## 2021-02-26 DIAGNOSIS — F17210 Nicotine dependence, cigarettes, uncomplicated: Secondary | ICD-10-CM | POA: Diagnosis not present

## 2021-02-26 DIAGNOSIS — I2510 Atherosclerotic heart disease of native coronary artery without angina pectoris: Secondary | ICD-10-CM | POA: Diagnosis not present

## 2021-02-26 DIAGNOSIS — Z9049 Acquired absence of other specified parts of digestive tract: Secondary | ICD-10-CM | POA: Diagnosis not present

## 2021-02-26 DIAGNOSIS — Z7982 Long term (current) use of aspirin: Secondary | ICD-10-CM | POA: Diagnosis not present

## 2021-02-26 DIAGNOSIS — E7800 Pure hypercholesterolemia, unspecified: Secondary | ICD-10-CM | POA: Diagnosis not present

## 2021-02-26 DIAGNOSIS — F32A Depression, unspecified: Secondary | ICD-10-CM | POA: Diagnosis not present

## 2021-02-26 DIAGNOSIS — Z7984 Long term (current) use of oral hypoglycemic drugs: Secondary | ICD-10-CM | POA: Diagnosis not present

## 2021-02-26 DIAGNOSIS — M869 Osteomyelitis, unspecified: Secondary | ICD-10-CM | POA: Diagnosis not present

## 2021-02-26 DIAGNOSIS — L97412 Non-pressure chronic ulcer of right heel and midfoot with fat layer exposed: Secondary | ICD-10-CM | POA: Diagnosis not present

## 2021-02-26 DIAGNOSIS — E1140 Type 2 diabetes mellitus with diabetic neuropathy, unspecified: Secondary | ICD-10-CM | POA: Diagnosis not present

## 2021-02-26 DIAGNOSIS — I10 Essential (primary) hypertension: Secondary | ICD-10-CM | POA: Diagnosis not present

## 2021-02-26 DIAGNOSIS — Z79899 Other long term (current) drug therapy: Secondary | ICD-10-CM | POA: Diagnosis not present

## 2021-03-01 DIAGNOSIS — B9562 Methicillin resistant Staphylococcus aureus infection as the cause of diseases classified elsewhere: Secondary | ICD-10-CM | POA: Diagnosis not present

## 2021-03-01 DIAGNOSIS — M869 Osteomyelitis, unspecified: Secondary | ICD-10-CM | POA: Diagnosis not present

## 2021-03-01 DIAGNOSIS — E1169 Type 2 diabetes mellitus with other specified complication: Secondary | ICD-10-CM | POA: Diagnosis not present

## 2021-03-01 DIAGNOSIS — L97412 Non-pressure chronic ulcer of right heel and midfoot with fat layer exposed: Secondary | ICD-10-CM | POA: Diagnosis not present

## 2021-03-01 DIAGNOSIS — E1140 Type 2 diabetes mellitus with diabetic neuropathy, unspecified: Secondary | ICD-10-CM | POA: Diagnosis not present

## 2021-03-01 DIAGNOSIS — E11621 Type 2 diabetes mellitus with foot ulcer: Secondary | ICD-10-CM | POA: Diagnosis not present

## 2021-03-03 DIAGNOSIS — E11621 Type 2 diabetes mellitus with foot ulcer: Secondary | ICD-10-CM | POA: Diagnosis not present

## 2021-03-03 DIAGNOSIS — E1169 Type 2 diabetes mellitus with other specified complication: Secondary | ICD-10-CM | POA: Diagnosis not present

## 2021-03-03 DIAGNOSIS — B9562 Methicillin resistant Staphylococcus aureus infection as the cause of diseases classified elsewhere: Secondary | ICD-10-CM | POA: Diagnosis not present

## 2021-03-03 DIAGNOSIS — M869 Osteomyelitis, unspecified: Secondary | ICD-10-CM | POA: Diagnosis not present

## 2021-03-03 DIAGNOSIS — E1140 Type 2 diabetes mellitus with diabetic neuropathy, unspecified: Secondary | ICD-10-CM | POA: Diagnosis not present

## 2021-03-03 DIAGNOSIS — L97412 Non-pressure chronic ulcer of right heel and midfoot with fat layer exposed: Secondary | ICD-10-CM | POA: Diagnosis not present

## 2021-03-09 DIAGNOSIS — L97412 Non-pressure chronic ulcer of right heel and midfoot with fat layer exposed: Secondary | ICD-10-CM | POA: Diagnosis not present

## 2021-03-09 DIAGNOSIS — Z89411 Acquired absence of right great toe: Secondary | ICD-10-CM | POA: Diagnosis not present

## 2021-03-09 DIAGNOSIS — M869 Osteomyelitis, unspecified: Secondary | ICD-10-CM | POA: Diagnosis not present

## 2021-03-09 DIAGNOSIS — Z79891 Long term (current) use of opiate analgesic: Secondary | ICD-10-CM | POA: Diagnosis not present

## 2021-03-09 DIAGNOSIS — E11621 Type 2 diabetes mellitus with foot ulcer: Secondary | ICD-10-CM | POA: Diagnosis not present

## 2021-03-09 DIAGNOSIS — E1142 Type 2 diabetes mellitus with diabetic polyneuropathy: Secondary | ICD-10-CM | POA: Diagnosis not present

## 2021-03-09 DIAGNOSIS — E1169 Type 2 diabetes mellitus with other specified complication: Secondary | ICD-10-CM | POA: Diagnosis not present

## 2021-03-09 DIAGNOSIS — M545 Low back pain, unspecified: Secondary | ICD-10-CM | POA: Diagnosis not present

## 2021-03-09 DIAGNOSIS — B9562 Methicillin resistant Staphylococcus aureus infection as the cause of diseases classified elsewhere: Secondary | ICD-10-CM | POA: Diagnosis not present

## 2021-03-09 DIAGNOSIS — E1140 Type 2 diabetes mellitus with diabetic neuropathy, unspecified: Secondary | ICD-10-CM | POA: Diagnosis not present

## 2021-03-11 DIAGNOSIS — M869 Osteomyelitis, unspecified: Secondary | ICD-10-CM | POA: Diagnosis not present

## 2021-03-11 DIAGNOSIS — E1169 Type 2 diabetes mellitus with other specified complication: Secondary | ICD-10-CM | POA: Diagnosis not present

## 2021-03-11 DIAGNOSIS — E1140 Type 2 diabetes mellitus with diabetic neuropathy, unspecified: Secondary | ICD-10-CM | POA: Diagnosis not present

## 2021-03-11 DIAGNOSIS — L97412 Non-pressure chronic ulcer of right heel and midfoot with fat layer exposed: Secondary | ICD-10-CM | POA: Diagnosis not present

## 2021-03-11 DIAGNOSIS — E11621 Type 2 diabetes mellitus with foot ulcer: Secondary | ICD-10-CM | POA: Diagnosis not present

## 2021-03-11 DIAGNOSIS — B9562 Methicillin resistant Staphylococcus aureus infection as the cause of diseases classified elsewhere: Secondary | ICD-10-CM | POA: Diagnosis not present

## 2021-03-15 DIAGNOSIS — L97412 Non-pressure chronic ulcer of right heel and midfoot with fat layer exposed: Secondary | ICD-10-CM | POA: Diagnosis not present

## 2021-03-15 DIAGNOSIS — E1140 Type 2 diabetes mellitus with diabetic neuropathy, unspecified: Secondary | ICD-10-CM | POA: Diagnosis not present

## 2021-03-15 DIAGNOSIS — E11621 Type 2 diabetes mellitus with foot ulcer: Secondary | ICD-10-CM | POA: Diagnosis not present

## 2021-03-15 DIAGNOSIS — M869 Osteomyelitis, unspecified: Secondary | ICD-10-CM | POA: Diagnosis not present

## 2021-03-15 DIAGNOSIS — B9562 Methicillin resistant Staphylococcus aureus infection as the cause of diseases classified elsewhere: Secondary | ICD-10-CM | POA: Diagnosis not present

## 2021-03-15 DIAGNOSIS — E1169 Type 2 diabetes mellitus with other specified complication: Secondary | ICD-10-CM | POA: Diagnosis not present

## 2021-03-17 ENCOUNTER — Other Ambulatory Visit (HOSPITAL_COMMUNITY): Payer: Self-pay | Admitting: General Surgery

## 2021-03-17 ENCOUNTER — Other Ambulatory Visit: Payer: Self-pay

## 2021-03-17 ENCOUNTER — Encounter (HOSPITAL_BASED_OUTPATIENT_CLINIC_OR_DEPARTMENT_OTHER): Payer: Medicare Other | Attending: Physician Assistant | Admitting: General Surgery

## 2021-03-17 DIAGNOSIS — I5022 Chronic systolic (congestive) heart failure: Secondary | ICD-10-CM | POA: Insufficient documentation

## 2021-03-17 DIAGNOSIS — L97512 Non-pressure chronic ulcer of other part of right foot with fat layer exposed: Secondary | ICD-10-CM | POA: Diagnosis not present

## 2021-03-17 DIAGNOSIS — I2510 Atherosclerotic heart disease of native coronary artery without angina pectoris: Secondary | ICD-10-CM | POA: Diagnosis not present

## 2021-03-17 DIAGNOSIS — E1142 Type 2 diabetes mellitus with diabetic polyneuropathy: Secondary | ICD-10-CM | POA: Diagnosis not present

## 2021-03-17 DIAGNOSIS — G4730 Sleep apnea, unspecified: Secondary | ICD-10-CM | POA: Diagnosis not present

## 2021-03-17 DIAGNOSIS — I110 Hypertensive heart disease with heart failure: Secondary | ICD-10-CM | POA: Insufficient documentation

## 2021-03-17 DIAGNOSIS — L97519 Non-pressure chronic ulcer of other part of right foot with unspecified severity: Secondary | ICD-10-CM | POA: Diagnosis not present

## 2021-03-17 DIAGNOSIS — E11621 Type 2 diabetes mellitus with foot ulcer: Secondary | ICD-10-CM | POA: Diagnosis not present

## 2021-03-17 DIAGNOSIS — F17210 Nicotine dependence, cigarettes, uncomplicated: Secondary | ICD-10-CM | POA: Insufficient documentation

## 2021-03-17 DIAGNOSIS — E1151 Type 2 diabetes mellitus with diabetic peripheral angiopathy without gangrene: Secondary | ICD-10-CM | POA: Insufficient documentation

## 2021-03-17 DIAGNOSIS — S91301A Unspecified open wound, right foot, initial encounter: Secondary | ICD-10-CM

## 2021-03-17 NOTE — Progress Notes (Signed)
[name], [name] ([id number]) ?Visit Report for 03/17/2021 ?Abuse Risk Screen Details ?Patient Name: Date of Service: ?[name], [name] 03/17/2021 9:00 A M ?Medical Record Number: [id number] ?Patient Account Number: [id number] ?Date of Birth/Sex: Treating RN: ?April 05, 1970 (52 y.o. [name]) Karie Schwalbe ?Primary Care [name]: [name] [name]: ?Referring [name]: ?Treating [name]/Extender: [name] ?[name] ?Weeks in Treatment: 0 ?Abuse Risk Screen Items ?Answer ?ABUSE RISK SCREEN: ?Has anyone close to you tried to hurt or harm you recentlyo No ?Do you feel uncomfortable with anyone in your familyo No ?Has anyone forced you do things that you didnt want to doo No ?Electronic Signature(s) ?Signed: 03/17/2021 6:00:36 PM By: Karie Schwalbe RN ?Entered By: Karie Schwalbe on 03/17/2021 09:46:19 ?-------------------------------------------------------------------------------- ?Activities of Daily Living Details ?Patient Name: Date of Service: ?[name], [name] 03/17/2021 9:00 A M ?Medical Record Number: [id number] ?Patient Account Number: [id number] ?Date of Birth/Sex: Treating RN: ?22-Aug-1970 (51 y.o. [name]) Karie Schwalbe ?Primary Care Mehek Grega: [name] [name]: ?Referring [name]: ?Treating [name]/Extender: [name] ?[name] ?Weeks in Treatment: 0 ?Activities of Daily Living Items ?Answer ?Activities of Daily Living (Please select one for each item) ?Drive Automobile Need Assistance ?T Medications ?ake Completely Able ?Use T elephone Completely Able ?Care for Appearance Completely Able ?Use T oilet Completely Able ?Bath / Shower Completely Able ?Dress Self Completely Able ?Feed Self Completely Able ?Walk Completely Able ?Get In / Out Bed Completely Able ?Housework Completely Able ?Prepare Meals Completely Able ?Handle Money Completely Able ?Shop for Self Completely Able ?Electronic Signature(s) ?Signed: 03/17/2021 6:00:36 PM By: Karie Schwalbe RN ?Entered  By: Karie Schwalbe on 03/17/2021 09:47:54 ?-------------------------------------------------------------------------------- ?Education Screening Details ?Patient Name: ?Date of Service: ?[name], [name] 03/17/2021 9:00 A M ?Medical Record Number: [id number] ?Patient Account Number: [id number] ?Date of Birth/Sex: ?Treating RN: ?November 21, 1970 (51 y.o. [name]) Karie Schwalbe ?Primary Care [name]: [name] ?[name]: ?Referring [name]: ?Treating [name]/Extender: [name] ?[name] ?Weeks in Treatment: 0 ?Primary Learner Assessed: Patient ?Learning Preferences/Education Level/Primary Language ?Highest Education Level: High School ?Preferred Language: English ?Cognitive Barrier ?Language Barrier: No ?Translator Needed: No ?Memory Deficit: No ?Emotional Barrier: No ?Cultural/Religious Beliefs Affecting Medical Care: No ?Physical Barrier ?Impaired Vision: No ?Impaired Hearing: No ?Decreased Hand dexterity: No ?Knowledge/Comprehension ?Knowledge Level: High ?Comprehension Level: High ?Ability to understand written instructions: High ?Ability to understand verbal instructions: High ?Motivation ?Anxiety Level: Calm ?Cooperation: Cooperative ?Education Importance: Acknowledges Need ?Interest in Health Problems: Asks Questions ?Perception: Coherent ?Willingness to Engage in Self-Management High ?Activities: ?Readiness to Engage in Self-Management High ?Activities: ?Electronic Signature(s) ?Signed: 03/17/2021 6:00:36 PM By: Karie Schwalbe RN ?Entered By: Karie Schwalbe on 03/17/2021 09:48:48 ?-------------------------------------------------------------------------------- ?Fall Risk Assessment Details ?Patient Name: ?Date of Service: ?[name], [name] 03/17/2021 9:00 A M ?Medical Record Number: [id number] ?Patient Account Number: [id number] ?Date of Birth/Sex: ?Treating RN: ?1970/04/21 (51 y.o. [name]) Karie Schwalbe ?Primary Care [name]: [name] ?[name]: ?Referring  [name]: ?Treating [name]/Extender: [name] ?[name] ?Weeks in Treatment: 0 ?Fall Risk Assessment Items ?Have you had 2 or more falls in the last 12 monthso 0 No ?Have you had any fall that resulted in injury in the last 12 monthso 0 No ?FALLS RISK SCREEN ?History of falling - immediate or within 3 months 0 No ?Secondary diagnosis (Do you have 2 or more medical diagnoseso) 0 No ?Ambulatory aid ?None/bed rest/wheelchair/nurse 0 No ?Crutches/cane/walker 0 No ?Furniture 0 No ?Intravenous therapy Access/Saline/Heparin Lock 0 No ?Gait/Transferring ?Normal/ bed rest/ wheelchair 0 No ?Weak (  short steps with or without shuffle, stooped but able to lift head while walking, may seek 0 No ?support from furniture) ?Impaired (short steps with shuffle, may have difficulty arising from chair, head down, impaired 0 No ?balance) ?Mental Status ?Oriented to own ability 0 No ?Electronic Signature(s) ?Signed: 03/17/2021 6:00:36 PM By: Karie Schwalbe RN ?Entered By: Karie Schwalbe on 03/17/2021 09:49:16 ?-------------------------------------------------------------------------------- ?Foot Assessment Details ?Patient Name: ?Date of Service: ?[name], [name] 03/17/2021 9:00 A M ?Medical Record Number: [id number] ?Patient Account Number: [id number] ?Date of Birth/Sex: ?Treating RN: ?11-13-1970 (51 y.o. [name]) Karie Schwalbe ?Primary Care Salvatrice Morandi: [name] ?[name]: ?Referring [name]: ?Treating [name]/Extender: [name] ?[name] ?Weeks in Treatment: 0 ?Foot Assessment Items ?Site Locations ?+ = Sensation present, - = Sensation absent, C = Callus, U = Ulcer ?R = Redness, W = Warmth, M = Maceration, PU = Pre-ulcerative lesion ?F = Fissure, S = Swelling, D = Dryness ?Assessment ?Right: Left: ?Jose Deformity: No No ?Prior Foot Ulcer: No No ?Prior Amputation: No No ?Charcot Joint: No No ?Ambulatory Status: Ambulatory Without Help ?Gait: Steady ?Electronic Signature(s) ?Signed: 03/17/2021  6:00:36 PM By: Karie Schwalbe RN ?Entered By: Karie Schwalbe on 03/17/2021 09:51:54 ?-------------------------------------------------------------------------------- ?Nutrition Risk Screening Details ?Patient Name: ?Date of Service: ?[name], [name] 03/17/2021 9:00 A M ?Medical Record Number: [id number] ?Patient Account Number: [id number] ?Date of Birth/Sex: ?Treating RN: ?[birth year] (51 y.o. [name]) Karie Schwalbe ?Primary Care Norine Reddington: [name] ?[name]: ?Referring Mattye Verdone: ?Treating Chelsei Mcchesney/Extender: [name] ?[name] ?Weeks in Treatment: 0 ?Height (in): 72 ?Weight (lbs): 290 ?Body Mass Index (BMI): 39.3 ?Nutrition Risk Screening Items ?Score Screening ?NUTRITION RISK SCREEN: ?I have an illness or condition that made me change the kind and/or amount of food I eat 0 No ?I eat fewer than two meals per day 0 No ?I eat few fruits and vegetables, or milk products 0 No ?I have three or more drinks of beer, liquor or wine almost every day 0 No ?I have tooth or mouth problems that make it hard for me to eat 0 No ?I don't always have enough money to buy the food I need 0 No ?I eat alone most of the time 0 No ?I take three or more different prescribed or over-the-counter drugs a day 1 Yes ?Without wanting to, I have lost or gained 10 pounds in the last six months 0 No ?I am not always physically able to shop, cook and/or feed myself 0 No ?Nutrition Protocols ?Good Risk Protocol 0 No interventions needed ?Moderate Risk Protocol ?High Risk Proctocol ?Risk Level: Good Risk ?Score: 1 ?Electronic Signature(s) ?Signed: 03/17/2021 6:00:36 PM By: Karie Schwalbe RN ?Entered By: Karie Schwalbe on 03/17/2021 09:51:23 ?

## 2021-03-18 DIAGNOSIS — E11621 Type 2 diabetes mellitus with foot ulcer: Secondary | ICD-10-CM | POA: Diagnosis not present

## 2021-03-18 DIAGNOSIS — E1140 Type 2 diabetes mellitus with diabetic neuropathy, unspecified: Secondary | ICD-10-CM | POA: Diagnosis not present

## 2021-03-18 DIAGNOSIS — L97412 Non-pressure chronic ulcer of right heel and midfoot with fat layer exposed: Secondary | ICD-10-CM | POA: Diagnosis not present

## 2021-03-18 DIAGNOSIS — E1169 Type 2 diabetes mellitus with other specified complication: Secondary | ICD-10-CM | POA: Diagnosis not present

## 2021-03-18 DIAGNOSIS — M869 Osteomyelitis, unspecified: Secondary | ICD-10-CM | POA: Diagnosis not present

## 2021-03-18 DIAGNOSIS — B9562 Methicillin resistant Staphylococcus aureus infection as the cause of diseases classified elsewhere: Secondary | ICD-10-CM | POA: Diagnosis not present

## 2021-03-18 NOTE — Progress Notes (Signed)
[name] ([id number]) Visit Report for 03/17/2021 Chief Complaint Document Details Patient Name: Date of Service: [name], RA NDO [facility] 03/17/2021 9:00 A M Medical Record Number: [id number] Patient Account Number: [id number] Date of Birth/Sex: Treating RN: November 05, 1970 (51 y.o. [name] Primary Care Provider: Stoney Bang Other Clinician: Referring Provider: Treating Provider/Extender: Yates Decamp in Treatment: 0 Information Obtained from: Patient Chief Complaint 03/17/2021: Right foot ulcer Electronic Signature(s) Signed: 03/17/2021 11:31:37 AM By: Fredirick Maudlin MD FACS Previous Signature: 03/17/2021 10:57:44 AM Version By: Fredirick Maudlin MD FACS Entered By: Fredirick Maudlin on 03/17/2021 11:31:37 -------------------------------------------------------------------------------- Debridement Details Patient Name: Date of Service: [name], RA NDO [facility] 03/17/2021 9:00 A M Medical Record Number: [id number] Patient Account Number: [id number] Date of Birth/Sex: Treating RN: [birth year] (51 y.o. [name] Primary Care Provider: Stoney Bang Other Clinician: Referring Provider: Treating Provider/Extender: Yates Decamp in Treatment: 0 Debridement Performed for Assessment: Wound #4 Right Metatarsal head first Performed By: Physician Fredirick Maudlin, MD Debridement Type: Debridement Severity of Tissue Pre Debridement: Fat layer exposed Level of Consciousness (Pre-procedure): Awake and Alert Pre-procedure Verification/Time Out Yes - 10:19 Taken: Start Time: 10:19 T Area Debrided (L x W): otal 3.3 (cm) x 3.2 (cm) = 10.56 (cm) Tissue and other material debrided: Non-Viable, Callus, Eschar, Subcutaneous, Skin: Dermis , Skin: Epidermis Level: Skin/Subcutaneous Tissue Debridement Description: Excisional Instrument: Blade, Curette, Forceps Bleeding: Minimum Hemostasis Achieved: Pressure End Time:  10:33 Procedural Pain: 0 Post Procedural Pain: 0 Response to Treatment: Procedure was tolerated well Level of Consciousness (Post- Awake and Alert procedure): Post Debridement Measurements of Total Wound Length: (cm) 3.3 Width: (cm) 3.2 Depth: (cm) 0.1 Volume: (cm) 0.829 Character of Wound/Ulcer Post Debridement: Improved Severity of Tissue Post Debridement: Fat layer exposed Post Procedure Diagnosis Same as Pre-procedure Electronic Signature(s) Signed: 03/17/2021 11:39:34 AM By: Fredirick Maudlin MD FACS Signed: 03/17/2021 6:00:36 PM By: Dellie Catholic RN Entered By: Dellie Catholic on 03/17/2021 10:39:44 -------------------------------------------------------------------------------- HPI Details Patient Name: Date of Service: [name], RA NDO [facility] 03/17/2021 9:00 A M Medical Record Number: [id number] Patient Account Number: [id number] Date of Birth/Sex: Treating RN: [birth year] (51 y.o. [name] Primary Care Provider: Stoney Bang Other Clinician: Referring Provider: Treating Provider/Extender: Yates Decamp in Treatment: 0 History of Present Illness HPI Description: ADMISSION 05/13/2019 This is a 51 year old man who has type 2 diabetes with peripheral neuropathy. He has a history of wounds on the plantar aspect of his left first and fourth toes. He has had these for several months. He was being seen in the wound care center at [facility] in [facility]. He apparently presented with swelling and an open wound at the tip of the toe. An MRI showed osteomyelitis. He was given 6 to 8 weeks of oral doxycycline again all of this via the patient we have none of these records. Since then he has been putting Goldbond on the areas wearing regular shoes. He was seen by his primary physician on 4/9 and sent down here for our review. In the primary care note it says he has been recommended for hyperbaric oxygen. Past medical history includes  type 2 diabetes with peripheral neuropathy, heart failure with reduced ejection fraction 30 to 35%, peripheral arterial disease, coronary artery disease, hyperlipidemia, hypertension, syncope and a prior history of a right great toe amputation also by Dr. Tye Maryland in [place] in our clinic were 0.91 on the left. Also notable that in 2019 he  appears to have had arterial studies that are visible in our system. At that point the right ABI was 1.17, TBI of 0.85 with triphasic waveforms. On the left his ABI was 1.19 with a TBI of 0.73 again with triphasic waveform 5/3; we did receive some information from [facility] wound care. The patient was seen there on 1/29. At that point he had wounds on the right foot at the amputation site the fifth digit fourth digit I am presuming on the right and then an area on the left first digit although that is not specifically stated, he has had a previous amputation on the right however. It would appear him at that time most of his ulcers were on the right the left foot is stated to have a lot of scales and calluses but not a lot of open wounds. The only wounds we define when he came in here last time were on the left first and left fourth toe He also had a wound culture that showed heavy growth of staph aureus and a slight growth of Stenotrophomonas maltophilia. The doxycycline should cover the staph aureus I am not sure about the stenotrophomonas. In any case he completed 6 weeks of this. UNFORTUNATELY I still do not have a copy of the MRI. And the exact justification for hyperbarics is therefore lacking. 5/10; the patient had osteomyelitis in the left fourth toe. He was treated for 6 weeks of doxycycline this wound is healed as is the left first toe. He was sent here for hyperbaric oxygen however at this point his wounds are healed and I think a course of watchful waiting and observation is in order. If the left fourth toe reopens then it is likely he will need a more  protracted and aggressive treatment approach Readmission: 05/06/20 upon evaluation today patient presents for readmission here in the clinic exam issues with his right foot on the medial aspect at the amputation site where his great toe was this is the first metatarsal area that is affected. He has been seeing Dr. Ladona Horns who performed the surgery. With that being said currently the patient was diagnosed as having MRSA on a culture that was + March 31. Has been on IV vancomycin for 6 weeks he is now on doxycycline as the MRSA was sensitive to Doxy and he does have 2 more refills he has been on that for 3 weeks already. He has MRI of January for showed L knee first metatarsal base early osteomyelitis versus possible reactive disease but nonetheless based on what was seen it appears osteomyelitis is more likely the cause here. He has had Apligraf although to be honest that did not seem to do the job. He is also been using Dakin's solution and currently is using Aquacel. Sharp debridement has been performed by Dr. Ladona Horns on a weekly basis according to what the patient tells me. With all that being said the patient was referred to [place] for further evaluation and treatment as unfortunately he does not seem to be responding well to his treatment with the surgeon. They have considered a total contact cast it sounds like but again with the infection that this was not a good idea. The patient does have a significant past medical history for diabetes mellitus type 2 for which she is on insulin, hypertension, significant congestive heart failure with a most recent ejection fraction of 30 to 35% and that was in 2020. He has not seen his cardiologist Dr. Harl Bowie since that time. In  regard to his diabetes his A1c he does not even know he tells me his blood sugars run somewhere in the 200-300 range. With that being said he has not seen the primary care provider recently for management of this either. The patient tells me  he is also a current smoker. He tells me he is trying to stop using nicotine pouches but he just does not have enough saliva he tells me his mouth is dry all the time this is probably a subsequent issue from the diabetes as well. Unfortunately he just appears to be in a place where he is not necessarily at his healthiest even with his medical conditions. I think he would be a candidate for hyperbaric oxygen therapy but there are a lot of other things that need to be in place first before we get to that point. 05/13/2020 patient presents for 1 week follow-up. He has been using Hydrofera Blue every other day without any issues. He had to reschedule his cardiologist appointment due to transportation Issues. He has no complaints today. 05/20/2020 upon evaluation today patient appears to be doing well with regard to his foot ulcer all things considered. With that being said he tells me that he has been tolerating the dressing changes without complication. There does not appear to be any signs of active infection which is great news and overall very pleased with where things stand today. No fevers, chills, nausea, vomiting, or diarrhea. 05/27/2020 upon evaluation today patient appears to be doing well with regard to his wound on the foot. He does not require little bit of debridement today but overall he seems to be doing quite well. He actually has his echo on Monday we will see what that shows as well were hoping to be able to get him into the hyperbaric oxygen chamber but again a lot depends on what this test shows he will also need a chest x-ray if this turns out okay before going in the chamber. 06/03/2020 upon evaluation today patient's wound actually showing signs of minimal improvement. Fortunately I think that he is continuing to make great progress all things considered. He does have his echocardiogram next week. That is good news so we can see whether or not hyperbaric oxygen therapy would be  appropriate for him I am hopeful we will be able to get him in the chamber sooner rather than later but again it all depends on his ejection fraction. Right now we have been somewhat limited in being able to proceed with that as to be perfectly honest we just have not gotten the needed testing in order to go forward with the hyperbarics. If his ejection fraction is too low which could be the case then obviously is not good to be able to 5/25; patient presents for 1 week follow-up. He states he has his echo set up for next week. He overall feels well. He denies any issues over the past week. He denies signs of infection. 06/17/2020 upon evaluation today patient appears to be doing about the same in regard to his foot ulcer is measuring a little bit smaller today but still keeps developing the spongy tissue. I really think it may be a good idea for [place] to obtain a sample of a punch biopsy to send for evaluation. He is in agreement with this plan. Subsequently he did have his echocardiogram which showed that he has a systolic left ventricular function which is moderately decreased estimated to be 30 to 35%. He does have  a follow-up appointment with the cardiologist on Monday to see what they think about this. I advised that he needs to talk to them as well that what they feel about him potentially going into hyperbaric oxygen therapy if his heart would be sufficient for this or not. Obviously if they feel that it is something that would be sufficient then we need to have this in writing. Otherwise patient tells me that he has been doing fairly well is not really having any significant pain. 06/24/2020 upon evaluation today patient appears to be doing decently well in regard to his wound. In fact he does not have as much of the tissue that grew back this time. I do see that he had the results back from his skin biopsy. This showed reactive squamous hyperplasia. This again seems to be somewhat similar to lichen  planus and how that would function. With that being said there was no malignancy and potentially this means it could be a result and a response to like a topical steroid like triamcinolone. I believe this may at least be something to get a shot at this point. The patient is in agreement with the plan. I am very pleased though there is no malignancy noted. We also got a letter back from his cardiologist stating that he is from a cardiac perspective able to participate in hyperbaric oxygen therapy. Therefore if need be I think we can proceed as such. For that reason I am going to send in for the chest x-ray in order to ensure that we have what we need in place if we do need to proceed down that road. 07/01/2020 upon evaluation today patient appears to be doing decently well in regard to his wound. He has been tolerating the dressing changes without complication. Fortunately there does not appear to be any signs of active infection which is great news. No fever chills noted. He does note that he had a little bit of increased pain over the past week this seems to be kind of a neuropathy type issue to be honest. It does seem to be coming from his wound and I do not think it is coming from anywhere else in his foot although he does note some referred pain that seems to be traveling a little bit on him. Overall I think that in general this is just more of a bit of a mind game in that regard not that he is not hurting but rather where he is experiencing the pain at. Either way I do believe that the wound is doing much better as compared to what it was previous. 07/08/2020 upon evaluation today patient's wound actually appears to be doing excellent. He is making great progress and the triamcinolone has done excellent for him. There does not appear to be any signs of active infection which is great news. No fevers, chills, nausea, vomiting, or diarrhea. 07/16/2018 upon evaluation today patient appears to be doing  well currently in regard to his wound. In fact this is getting very close to complete resolution. I am extremely pleased with where we see things standing today. There does not appear to be any evidence of active infection which is great and overall happy in that regard as well. Fortunately the patient does not seem to be making any backslide as far as the wound is concerned and overall I really think that he is making excellent progress to be honest. No fevers, chills, nausea, vomiting, or diarrhea. 07/22/2020 upon evaluation today patient  appears to be doing well with regard to his wound on initial inspection however he actually had some area of purulence noted up underneath the apparently healed area. This appears to have callused over and then subsequently started draining. This is going require quite a bit of debridement today unfortunately. This is a little bit of a setback but hopefully not too much. READMISSION 03/17/2021: The patient returns to our clinic today after a little over 6 months absence. He had used all of his transportation vouchers and was unable to come to [place]. He has been followed at [facility] by a general surgeon there. Reading the electronic medical record, it sounds like not a significant amount of progress was made and the last several notes discussed leaving the eschar in place and allowing it to accumulate. From what I am able to discern, the last actual debridement took place in December 2022. The last progress note indicates that the surgeon wanted him to return here for ongoing management of a wound that has been quite challenging. ABI in clinic today was 0.62. I do not see any evidence of further vascular studies since 2019, at which time the only study performed was formal ankle- brachial and toe brachial indices. X-ray done in [place] on December 04, 2020: 1. 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 2. Prior first digit amputation. 3. Stable midfoot osteoarthritis. 4. No acute fracture or destructive bony lesion He continues to smoke, admitting to smoking 1 pack/day Electronic Signature(s) Signed: 03/17/2021 11:31:53 AM By: Fredirick Maudlin MD FACS Previous Signature: 03/17/2021 11:05:09 AM Version By: Fredirick Maudlin MD FACS Previous Signature: 03/17/2021 11:03:43 AM Version By: Fredirick Maudlin MD FACS Entered By: Fredirick Maudlin on 03/17/2021 11:31:53 -------------------------------------------------------------------------------- Physical Exam Details Patient Name: Date of Service: [name], RA NDO [facility] 03/17/2021 9:00 A M Medical Record Number: [id number] Patient Account Number: [id number] Date of Birth/Sex: Treating RN: Oct 14, 1970 (51 y.o. [name] Primary Care Provider: Stoney Bang Other Clinician: Referring Provider: Treating Provider/Extender: Pedro Earls Weeks in Treatment: 0 Constitutional . . . No acute distress. Respiratory Normal work of breathing on room air.. Cardiovascular No significant edema bilaterally.. Notes 03/17/2021: Wound evaluationthe wound at the right first metatarsal head has extensive heaped up eschar and callus. The depth of the wound is completely unable to be assessed due to this overlying material. I debrided all of this back using a combination of a #10 scalpel and several curettes until it was completely removed. The open portion of the wound is actually fairly small, but the overlying tissue was pale. I continue to debride this back to good bleeding tissue. Hemostasis obtained with pressure. Electronic Signature(s) Signed: 03/17/2021 11:32:18 AM By: Fredirick Maudlin MD FACS Previous Signature: 03/17/2021 11:07:30 AM Version By: Fredirick Maudlin MD FACS Entered By: Fredirick Maudlin on 03/17/2021 11:32:18 -------------------------------------------------------------------------------- Physician Orders  Details Patient Name: Date of Service: [name], RA NDO [facility] 03/17/2021 9:00 A M Medical Record Number: [id number] Patient Account Number: [id number] Date of Birth/Sex: Treating RN: 09/27/1970 (51 y.o. [name] Primary Care Provider: Stoney Bang Other Clinician: Referring Provider: Treating Provider/Extender: Yates Decamp in Treatment: 0 Verbal / Phone Orders: No Diagnosis Coding ICD-10 Coding Code Description E11.42 Type 2 diabetes mellitus with diabetic polyneuropathy L97.519 Non-pressure chronic ulcer of other part of right foot with unspecified severity Follow-up Appointments ppointment in 1 week. - ******Make appointment for Monday 03/22/21 and Wednesday 03/24/21 with Dr Celine Ahr******* Return  A Bathing/ Shower/ Hygiene May shower with protection but do not get wound dressing(s) wet. Off-Loading Wedge shoe to: - right foot to ambulate Additional Orders / Instructions Stop/Decrease Smoking Follow [place] wound care orders this week; continue Home Health for wound care. May utilize formulary equivalent dressing for wound treatment orders unless otherwise specified. Other Home Health Orders/Instructions: - [facility] Wound Treatment Wound #4 - Metatarsal head first Wound Laterality: Right Cleanser: Soap and Water Every Other Day/30 Days Discharge Instructions: May shower and wash wound with dial antibacterial soap and water prior to dressing change. Cleanser: Wound Cleanser [facility]) Every Other Day/30 Days Discharge Instructions: Cleanse the wound with wound cleanser prior to applying a clean dressing using gauze sponges, not tissue or cotton balls. Prim Dressing: Hydrofera Blue Ready Foam, 4x5 in Every Other Day/30 Days ary Discharge Instructions: Apply to wound bed as instructed Secondary Dressing: Bordered Gauze, 4x4 in [facility]) Every Other Day/30 Days Discharge Instructions: Apply over primary  dressing as directed. Secured With: [employer], 4.5x3.1 (in/yd) [facility]) Every Other Day/30 Days Discharge Instructions: Secure with Kerlix as directed. Secured With: 72M Medipore H Soft Cloth Surgical T ape, 4 x 10 (in/yd) (Home Health) Every Other Day/30 Days Discharge Instructions: Secure with tape as directed. Services and Therapies rterial Studies- Bilateral - Low ABI in clinic - (ICD10 E11.42 - Type 2 diabetes mellitus with diabetic polyneuropathy) A Electronic Signature(s) Signed: 03/17/2021 1:00:05 PM By: Fredirick Maudlin MD FACS Signed: 03/17/2021 6:00:36 PM By: Dellie Catholic RN Previous Signature: 03/17/2021 11:39:34 AM Version By: Fredirick Maudlin MD FACS Entered By: Dellie Catholic on 03/17/2021 12:11:51 -------------------------------------------------------------------------------- Problem List Details Patient Name: Date of Service: [name], RA NDO [facility] 03/17/2021 9:00 A M Medical Record Number: [id number] Patient Account Number: [id number] Date of Birth/Sex: Treating RN: [birth year] (51 y.o. [name] Primary Care Provider: Stoney Bang Other Clinician: Referring Provider: Treating Provider/Extender: Yates Decamp in Treatment: 0 Active Problems ICD-10 Encounter Code Description Active Date MDM Diagnosis E11.42 Type 2 diabetes mellitus with diabetic polyneuropathy 03/17/2021 No Yes L97.519 Non-pressure chronic ulcer of other part of right foot with unspecified severity 03/17/2021 No Yes Inactive Problems Resolved Problems Electronic Signature(s) Signed: 03/17/2021 10:56:37 AM By: Fredirick Maudlin MD FACS Entered By: Fredirick Maudlin on 03/17/2021 10:56:37 -------------------------------------------------------------------------------- Progress Note/History and Physical Details Patient Name: Date of Service: [name], RA NDO [facility] 03/17/2021 9:00 A M Medical Record Number: [id number] Patient Account Number:  [id number] Date of Birth/Sex: Treating RN: 1970-10-19 (51 y.o. [name] Primary Care Provider: Other Clinician: Stoney Bang Referring Provider: Treating Provider/Extender: Yates Decamp in Treatment: 0 Subjective Chief Complaint Information obtained from Patient 03/17/2021: Right foot ulcer History of Present Illness (HPI) ADMISSION 05/13/2019 This is a 51 year old man who has type 2 diabetes with peripheral neuropathy. He has a history of wounds on the plantar aspect of his left first and fourth toes. He has had these for several months. He was being seen in the wound care center at [facility] in [facility]. He apparently presented with swelling and an open wound at the tip of the toe. An MRI showed osteomyelitis. He was given 6 to 8 weeks of oral doxycycline again all of this via the patient we have none of these records. Since then he has been putting [place] on the areas wearing regular shoes. He was seen by his primary physician on 4/9 and sent down here for our review.  In the primary care note it says he has been recommended for hyperbaric oxygen. Past medical history includes type 2 diabetes with peripheral neuropathy, heart failure with reduced ejection fraction 30 to 35%, peripheral arterial disease, coronary artery disease, hyperlipidemia, hypertension, syncope and a prior history of a right great toe amputation also by Dr. Tye Maryland in [place] in our clinic were 0.91 on the left. Also notable that in 2019 he appears to have had arterial studies that are visible in our system. At that point the right ABI was 1.17, TBI of 0.85 with triphasic waveforms. On the left his ABI was 1.19 with a TBI of 0.73 again with triphasic waveform 5/3; we did receive some information from [facility] wound care. The patient was seen there on 1/29. At that point he had wounds on the right foot at the amputation site the fifth digit fourth digit I am  presuming on the right and then an area on the left first digit although that is not specifically stated, he has had a previous amputation on the right however. It would appear him at that time most of his ulcers were on the right the left foot is stated to have a lot of scales and calluses but not a lot of open wounds. The only wounds we define when he came in here last time were on the left first and left fourth toe He also had a wound culture that showed heavy growth of staph aureus and a slight growth of Stenotrophomonas maltophilia. The doxycycline should cover the staph aureus I am not sure about the stenotrophomonas. In any case he completed 6 weeks of this. UNFORTUNATELY I still do not have a copy of the MRI. And the exact justification for hyperbarics is therefore lacking. 5/10; the patient had osteomyelitis in the left fourth toe. He was treated for 6 weeks of doxycycline this wound is healed as is the left first toe. He was sent here for hyperbaric oxygen however at this point his wounds are healed and I think a course of watchful waiting and observation is in order. If the left fourth toe reopens then it is likely he will need a more protracted and aggressive treatment approach Readmission: 05/06/20 upon evaluation today patient presents for readmission here in the clinic exam issues with his right foot on the medial aspect at the amputation site where his great toe was this is the first metatarsal area that is affected. He has been seeing Dr. Ladona Horns who performed the surgery. With that being said currently the patient was diagnosed as having MRSA on a culture that was + March 31. Has been on IV vancomycin for 6 weeks he is now on doxycycline as the MRSA was sensitive to Doxy and he does have 2 more refills he has been on that for 3 weeks already. He has MRI of January for showed L knee first metatarsal base early osteomyelitis versus possible reactive disease but nonetheless based on what  was seen it appears osteomyelitis is more likely the cause here. He has had Apligraf although to be honest that did not seem to do the job. He is also been using Dakin's solution and currently is using Aquacel. Sharp debridement has been performed by Dr. Ladona Horns on a weekly basis according to what the patient tells me. With all that being said the patient was referred to [place] for further evaluation and treatment as unfortunately he does not seem to be responding well to his treatment with the surgeon.  They have considered a total contact cast it sounds like but again with the infection that this was not a good idea. The patient does have a significant past medical history for diabetes mellitus type 2 for which she is on insulin, hypertension, significant congestive heart failure with a most recent ejection fraction of 30 to 35% and that was in 2020. He has not seen his cardiologist Dr. Harl Bowie since that time. In regard to his diabetes his A1c he does not even know he tells me his blood sugars run somewhere in the 200-300 range. With that being said he has not seen the primary care provider recently for management of this either. The patient tells me he is also a current smoker. He tells me he is trying to stop using nicotine pouches but he just does not have enough saliva he tells me his mouth is dry all the time this is probably a subsequent issue from the diabetes as well. Unfortunately he just appears to be in a place where he is not necessarily at his healthiest even with his medical conditions. I think he would be a candidate for hyperbaric oxygen therapy but there are a lot of other things that need to be in place first before we get to that point. 05/13/2020 patient presents for 1 week follow-up. He has been using Hydrofera Blue every other day without any issues. He had to reschedule his cardiologist appointment due to transportation Issues. He has no complaints today. 05/20/2020 upon evaluation  today patient appears to be doing well with regard to his foot ulcer all things considered. With that being said he tells me that he has been tolerating the dressing changes without complication. There does not appear to be any signs of active infection which is great news and overall very pleased with where things stand today. No fevers, chills, nausea, vomiting, or diarrhea. 05/27/2020 upon evaluation today patient appears to be doing well with regard to his wound on the foot. He does not require little bit of debridement today but overall he seems to be doing quite well. He actually has his echo on Monday we will see what that shows as well were hoping to be able to get him into the hyperbaric oxygen chamber but again a lot depends on what this test shows he will also need a chest x-ray if this turns out okay before going in the chamber. 06/03/2020 upon evaluation today patient's wound actually showing signs of minimal improvement. Fortunately I think that he is continuing to make great progress all things considered. He does have his echocardiogram next week. That is good news so we can see whether or not hyperbaric oxygen therapy would be appropriate for him I am hopeful we will be able to get him in the chamber sooner rather than later but again it all depends on his ejection fraction. Right now we have been somewhat limited in being able to proceed with that as to be perfectly honest we just have not gotten the needed testing in order to go forward with the hyperbarics. If his ejection fraction is too low which could be the case then obviously is not good to be able to 5/25; patient presents for 1 week follow-up. He states he has his echo set up for next week. He overall feels well. He denies any issues over the past week. He denies signs of infection. 06/17/2020 upon evaluation today patient appears to be doing about the same in regard to his foot ulcer  is measuring a little bit smaller today but  still keeps developing the spongy tissue. I really think it may be a good idea for [place] to obtain a sample of a punch biopsy to send for evaluation. He is in agreement with this plan. Subsequently he did have his echocardiogram which showed that he has a systolic left ventricular function which is moderately decreased estimated to be 30 to 35%. He does have a follow-up appointment with the cardiologist on Monday to see what they think about this. I advised that he needs to talk to them as well that what they feel about him potentially going into hyperbaric oxygen therapy if his heart would be sufficient for this or not. Obviously if they feel that it is something that would be sufficient then we need to have this in writing. Otherwise patient tells me that he has been doing fairly well is not really having any significant pain. 06/24/2020 upon evaluation today patient appears to be doing decently well in regard to his wound. In fact he does not have as much of the tissue that grew back this time. I do see that he had the results back from his skin biopsy. This showed reactive squamous hyperplasia. This again seems to be somewhat similar to lichen planus and how that would function. With that being said there was no malignancy and potentially this means it could be a result and a response to like a topical steroid like triamcinolone. I believe this may at least be something to get a shot at this point. The patient is in agreement with the plan. I am very pleased though there is no malignancy noted. We also got a letter back from his cardiologist stating that he is from a cardiac perspective able to participate in hyperbaric oxygen therapy. Therefore if need be I think we can proceed as such. For that reason I am going to send in for the chest x-ray in order to ensure that we have what we need in place if we do need to proceed down that road. 07/01/2020 upon evaluation today patient appears to be doing  decently well in regard to his wound. He has been tolerating the dressing changes without complication. Fortunately there does not appear to be any signs of active infection which is great news. No fever chills noted. He does note that he had a little bit of increased pain over the past week this seems to be kind of a neuropathy type issue to be honest. It does seem to be coming from his wound and I do not think it is coming from anywhere else in his foot although he does note some referred pain that seems to be traveling a little bit on him. Overall I think that in general this is just more of a bit of a mind game in that regard not that he is not hurting but rather where he is experiencing the pain at. Either way I do believe that the wound is doing much better as compared to what it was previous. 07/08/2020 upon evaluation today patient's wound actually appears to be doing excellent. He is making great progress and the triamcinolone has done excellent for him. There does not appear to be any signs of active infection which is great news. No fevers, chills, nausea, vomiting, or diarrhea. 07/16/2018 upon evaluation today patient appears to be doing well currently in regard to his wound. In fact this is getting very close to complete resolution. I am extremely pleased  with where we see things standing today. There does not appear to be any evidence of active infection which is great and overall happy in that regard as well. Fortunately the patient does not seem to be making any backslide as far as the wound is concerned and overall I really think that he is making excellent progress to be honest. No fevers, chills, nausea, vomiting, or diarrhea. 07/22/2020 upon evaluation today patient appears to be doing well with regard to his wound on initial inspection however he actually had some area of purulence noted up underneath the apparently healed area. This appears to have callused over and then subsequently  started draining. This is going require quite a bit of debridement today unfortunately. This is a little bit of a setback but hopefully not too much. READMISSION 03/17/2021: The patient returns to our clinic today after a little over 6 months absence. He had used all of his transportation vouchers and was unable to come to [place]. He has been followed at [facility] by a general surgeon there. Reading the electronic medical record, it sounds like not a significant amount of progress was made and the last several notes discussed leaving the eschar in place and allowing it to accumulate. From what I am able to discern, the last actual debridement took place in December 2022. The last progress note indicates that the surgeon wanted him to return here for ongoing management of a wound that has been quite challenging. ABI in clinic today was 0.62. I do not see any evidence of further vascular studies since 2019, at which time the only study performed was formal ankle- brachial and toe brachial indices. X-ray done in [place] on December 04, 2020: 1. 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 2. Prior first digit amputation. 3. Stable midfoot osteoarthritis. 4. No acute fracture or destructive bony lesion He continues to smoke, admitting to smoking 1 pack/day Patient History Information obtained from Patient. Allergies No Known Allergies Family History Unknown History, Cancer, Hypertension - Father, Lung Disease - Paternal Grandparents, No family history of Diabetes, Heart Disease, Hereditary Spherocytosis, Kidney Disease, Seizures, Stroke, Thyroid Problems, Tuberculosis. Social History Current every day smoker - 1 PPD, Marital Status - Separated, Alcohol Use - Rarely, Drug Use - No History, Caffeine Use - Daily - Coffee. Medical History Eyes Denies history of Cataracts, Glaucoma, Optic Neuritis Ear/Nose/Mouth/Throat Denies history of  Chronic sinus problems/congestion, Middle ear problems Hematologic/Lymphatic Denies history of Anemia, Hemophilia, Human Immunodeficiency Virus, Lymphedema, Sickle Cell Disease Respiratory Patient has history of Sleep Apnea Denies history of Aspiration, Asthma, Chronic Obstructive Pulmonary Disease (COPD), Pneumothorax, Tuberculosis Cardiovascular Patient has history of Congestive Heart Failure, Hypertension Denies history of Angina, Arrhythmia, Coronary Artery Disease, Deep Vein Thrombosis, Hypotension, Myocardial Infarction, Peripheral Arterial Disease, Peripheral Venous Disease, Phlebitis, Vasculitis Gastrointestinal Denies history of Cirrhosis , Colitis, Crohnoos, Hepatitis A, Hepatitis B, Hepatitis C Endocrine Patient has history of Type II Diabetes Denies history of Type I Diabetes Genitourinary Denies history of End Stage Renal Disease Immunological Denies history of Lupus Erythematosus, Raynaudoos, Scleroderma Integumentary (Skin) Denies history of History of Burn Musculoskeletal Denies history of Gout, Rheumatoid Arthritis, Osteoarthritis, Osteomyelitis Neurologic Patient has history of Neuropathy - bilateral legs Denies history of Dementia, Quadriplegia, Paraplegia, Seizure Disorder Oncologic Denies history of Received Chemotherapy, Received Radiation Psychiatric Denies history of Anorexia/bulimia, Confinement Anxiety Patient is treated with Insulin, Oral Agents. Blood sugar is not tested. Blood sugar results noted at the following times: Breakfast - 137,  Bedtime - 93. Hospitalization/Surgery History - Appendectomy;Oral surgery;Right great toe amputation.. Medical A Surgical History Notes nd Eyes c/o right eye feeling dry Review of Systems (ROS) Constitutional Symptoms (General Health) Denies complaints or symptoms of Fatigue, Fever, Chills, Marked Weight Change. Ear/Nose/Mouth/Throat Denies complaints or symptoms of Chronic sinus problems or  rhinitis. Respiratory Complains or has symptoms of Shortness of Breath. Gastrointestinal Complains or has symptoms of Nausea - from meds. Genitourinary Denies complaints or symptoms of Frequent urination. Integumentary (Skin) Complains or has symptoms of Wounds - Right 1st metatarsal head. Objective Constitutional No acute distress. Vitals Time Taken: 9:24 AM, Height: 72 in, Source: Stated, Weight: 290 lbs, Source: Stated, BMI: 39.3, Temperature: 98.6 F, Pulse: 82 bpm, Respiratory Rate: 20 breaths/min, Blood Pressure: 123/56 mmHg. Respiratory Normal work of breathing on room air.. Cardiovascular No significant edema bilaterally.. General Notes: 03/17/2021: Wound evaluationoothe wound at the right first metatarsal head has extensive heaped up eschar and callus. The depth of the wound is completely unable to be assessed due to this overlying material. I debrided all of this back using a combination of a #10 scalpel and several curettes until it was completely removed. The open portion of the wound is actually fairly small, but the overlying tissue was pale. I continue to debride this back to good bleeding tissue. Hemostasis obtained with pressure. Integumentary (Hair, Skin) Wound #4 status is Open. Original cause of wound was Trauma. The date acquired was: 03/18/2019. The wound is located on the Right Metatarsal head first. The wound measures 3.3cm length x 3.2cm width x 0.1cm depth; 8.294cm^2 area and 0.829cm^3 volume. There is Fat Layer (Subcutaneous Tissue) exposed. There is no tunneling or undermining noted. There is a medium amount of serosanguineous drainage noted. The wound margin is distinct with the outline attached to the wound base. There is no granulation within the wound bed. There is a large (67-100%) amount of necrotic tissue within the wound bed including Eschar and Adherent Slough. 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 There was a Excisional Skin/Subcutaneous Tissue Debridement with a total area of 10.56 sq cm performed by Fredirick Maudlin, MD. With the following instrument(s): Blade, Curette, and Forceps to remove Non-Viable tissue/material. Material removed includes Eschar, Callus, Subcutaneous Tissue, Skin: Dermis, and Skin: Epidermis. A time out was conducted at 10:19, prior to the start of the procedure. A Minimum amount of bleeding was controlled with Pressure. The procedure was tolerated well with a pain level of 0 throughout and a pain level of 0 following the procedure. Post Debridement Measurements: 3.3cm length x 3.2cm width x 0.1cm depth; 0.829cm^3 volume. Character of Wound/Ulcer Post Debridement is improved. Severity of Tissue Post Debridement is: Fat layer exposed. Post procedure Diagnosis Wound #4: Same as Pre-Procedure Plan Follow-up Appointments: Return Appointment in 1 week. - ******Make appointment for Monday 03/22/21 and Wednesday 03/24/21 with Dr Celine Ahr******* Bathing/ Shower/ Hygiene: May shower with protection but do not get wound dressing(s) wet. Off-Loading: Wedge shoe to: - right foot to ambulate Additional Orders / Instructions: Stop/Decrease Smoking Follow Nutritious Diet Home Health: New wound care orders this week; continue Home Health for wound care. May utilize formulary equivalent dressing for wound treatment orders unless otherwise specified. Other Home Health Orders/Instructions: -  [facility], music) WOUND #4: - Metatarsal head first Wound Laterality: Right Cleanser: Soap and Water Every Other Day/30 Days Discharge Instructions: May shower and wash wound with dial  antibacterial soap and water prior to dressing change. Cleanser: Wound Cleanser [facility]) Every Other Day/30 Days Discharge Instructions: Cleanse the wound with wound cleanser prior to applying a clean dressing using gauze sponges, not tissue or cotton balls. Prim Dressing: Hydrofera Blue Ready Foam, 4x5 in Every Other Day/30 Days ary Discharge Instructions: Apply to wound bed as instructed Secondary Dressing: Bordered Gauze, 4x4 in [facility]) Every Other Day/30 Days Discharge Instructions: Apply over primary dressing as directed. Secured With: [employer], 4.5x3.1 (in/yd) [facility]) Every Other Day/30 Days Discharge Instructions: Secure with Kerlix as directed. Secured With: 51M Medipore H Soft Cloth Surgical T ape, 4 x 10 (in/yd) (Home Health) Every Other Day/30 Days Discharge Instructions: Secure with tape as directed. 03/17/2021: The wound at the right first metatarsal head has extensive heaped up eschar and callus. The depth of the wound is completely unable to be assessed due to this overlying material. I debrided all of this back using a combination of a #10 scalpel and several curettes until it was completely removed. The open portion of the wound is actually fairly small, but the overlying tissue was pale. I continue to debride this back to good bleeding tissue. Hemostasis obtained with pressure. I think the patient would greatly benefit from total contact casting. He is interested and amenable to this. Unfortunately, we cannot applied in clinic today as his transportation requires 3 days advance notice and we would need to see him back in 2 days. We will place him in [facility], changing every other day until he comes in on Monday. He will offload with a wedge shoe. On Monday, we will apply the total contact cast with a follow-up visit on Wednesday for his first post cast check. In addition, due to abnormal ankle-brachial indices, we will place referral to vascular  surgery to investigate whether or not he has any revascularization options, as this wound has been open for several years without healing. He was advised to stop smoking, since it clearly is not benefiting his wound in any way, shape, or form. Electronic Signature(s) Signed: 03/17/2021 11:38:38 AM By: Fredirick Maudlin MD FACS Entered By: Fredirick Maudlin on 03/17/2021 11:38:37 -------------------------------------------------------------------------------- HxROS Details Patient Name: Date of Service: [name], RA NDO [facility] 03/17/2021 9:00 A M Medical Record Number: [id number] Patient Account Number: [id number] Date of Birth/Sex: Treating RN: September 16, 1970 (51 y.o. [name] Primary Care Provider: Stoney Bang Other Clinician: Referring Provider: Treating Provider/Extender: Yates Decamp in Treatment: 0 Label Progress Note Print Version as History and Physical for this encounter Information Obtained From Patient Constitutional Symptoms (General Health) Complaints and Symptoms: Negative for: Fatigue; Fever; Chills; Marked Weight Change Ear/Nose/Mouth/Throat Complaints and Symptoms: Negative for: Chronic sinus problems or rhinitis Medical History: Negative for: Chronic sinus problems/congestion; Middle ear problems Respiratory Complaints and Symptoms: Positive for: Shortness of Breath Medical History: Positive for: Sleep Apnea Negative for: Aspiration; Asthma; Chronic Obstructive Pulmonary Disease (COPD); Pneumothorax; Tuberculosis Gastrointestinal Complaints and Symptoms: Positive for: Nausea - from meds Medical History: Negative for: Cirrhosis ; Colitis; Crohns; Hepatitis A; Hepatitis B; Hepatitis C Genitourinary Complaints and Symptoms: Negative for: Frequent urination Medical History: Negative for: End Stage Renal Disease Integumentary (Skin) Complaints and Symptoms: Positive for: Wounds - Right 1st metatarsal head Medical History: Negative  for: History of Burn Eyes Medical History:  Negative for: Cataracts; Glaucoma; Optic Neuritis Past Medical History Notes: c/o right eye feeling dry Hematologic/Lymphatic Medical History: Negative for: Anemia; Hemophilia; Human Immunodeficiency Virus; Lymphedema; Sickle Cell Disease Cardiovascular Medical History: Positive for: Congestive Heart Failure; Hypertension Negative for: Angina; Arrhythmia; Coronary Artery Disease; Deep Vein Thrombosis; Hypotension; Myocardial Infarction; Peripheral Arterial Disease; Peripheral Venous Disease; Phlebitis; Vasculitis Endocrine Medical History: Positive for: Type II Diabetes Negative for: Type I Diabetes Time with diabetes: 14 years Treated with: Insulin, Oral agents Blood sugar tested every day: No Blood sugar testing results: Breakfast: 137; Bedtime: 93 Immunological Medical History: Negative for: Lupus Erythematosus; Raynauds; Scleroderma Musculoskeletal Medical History: Negative for: Gout; Rheumatoid Arthritis; Osteoarthritis; Osteomyelitis Neurologic Medical History: Positive for: Neuropathy - bilateral legs Negative for: Dementia; Quadriplegia; Paraplegia; Seizure Disorder Oncologic Medical History: Negative for: Received Chemotherapy; Received Radiation Psychiatric Medical History: Negative for: Anorexia/bulimia; Confinement Anxiety Immunizations Pneumococcal Vaccine: Received Pneumococcal Vaccination: No Implantable Devices None Hospitalization / Surgery History Type of Hospitalization/Surgery Appendectomy;Oral surgery;Right great toe amputation. Family and Social History Unknown History: Yes; Cancer: Yes; Diabetes: No; Heart Disease: No; Hereditary Spherocytosis: No; Hypertension: Yes - Father; Kidney Disease: No; Lung Disease: Yes - Paternal Grandparents; Seizures: No; Stroke: No; Thyroid Problems: No; Tuberculosis: No; Current every day smoker - 1 PPD; Marital Status - Separated; Alcohol Use: Rarely; Drug Use: No  History; Caffeine Use: Daily - Coffee; Financial Concerns: No; Food, Clothing or Shelter Needs: No; Support System Lacking: No; Transportation Concerns: No Electronic Signature(s) Signed: 03/17/2021 11:39:34 AM By: Fredirick Maudlin MD FACS Signed: 03/17/2021 6:00:36 PM By: Dellie Catholic RN Entered By: Dellie Catholic on 03/17/2021 09:45:58 -------------------------------------------------------------------------------- SuperBill Details Patient Name: Date of Service: [name], RA NDO [facility] 03/17/2021 Medical Record Number: [id number] Patient Account Number: [id number] Date of Birth/Sex: Treating RN: 12-26-1970 (51 y.o. [name] Primary Care Provider: Stoney Bang Other Clinician: Referring Provider: Treating Provider/Extender: Yates Decamp in Treatment: 0 Diagnosis Coding ICD-10 Codes Code Description E11.42 Type 2 diabetes mellitus with diabetic polyneuropathy L97.519 Non-pressure chronic ulcer of other part of right foot with unspecified severity Facility Procedures CPT4 Code: [id number] Description: 99213 - WOUND CARE VISIT-LEV 3 EST PT Modifier: Quantity: 1 Physician Procedures : CPT4 Code Description Modifier [id number] - WC PHYS LEVEL 4 - EST PT 25 ICD-10 Diagnosis Description L97.519 Non-pressure chronic ulcer of other part of right foot with unspecified severity Quantity: 1 : [id number] - WC PHYS SUBQ TISS 20 SQ CM ICD-10 Diagnosis Description L97.519 Non-pressure chronic ulcer of other part of right foot with unspecified severity Quantity: 1 Electronic Signature(s) Signed: 03/17/2021 6:00:36 PM By: Dellie Catholic RN Signed: 03/18/2021 8:55:41 AM By: Fredirick Maudlin MD FACS Previous Signature: 03/17/2021 11:39:08 AM Version By: Fredirick Maudlin MD FACS Entered By: Dellie Catholic on 03/17/2021 17:34:01

## 2021-03-22 ENCOUNTER — Encounter (HOSPITAL_BASED_OUTPATIENT_CLINIC_OR_DEPARTMENT_OTHER): Payer: Medicare Other | Admitting: General Surgery

## 2021-03-22 ENCOUNTER — Other Ambulatory Visit: Payer: Self-pay

## 2021-03-22 DIAGNOSIS — E1151 Type 2 diabetes mellitus with diabetic peripheral angiopathy without gangrene: Secondary | ICD-10-CM | POA: Diagnosis not present

## 2021-03-22 DIAGNOSIS — I2510 Atherosclerotic heart disease of native coronary artery without angina pectoris: Secondary | ICD-10-CM | POA: Diagnosis not present

## 2021-03-22 DIAGNOSIS — L97512 Non-pressure chronic ulcer of other part of right foot with fat layer exposed: Secondary | ICD-10-CM | POA: Diagnosis not present

## 2021-03-22 DIAGNOSIS — L97519 Non-pressure chronic ulcer of other part of right foot with unspecified severity: Secondary | ICD-10-CM | POA: Diagnosis not present

## 2021-03-22 DIAGNOSIS — E1142 Type 2 diabetes mellitus with diabetic polyneuropathy: Secondary | ICD-10-CM | POA: Diagnosis not present

## 2021-03-22 DIAGNOSIS — E11621 Type 2 diabetes mellitus with foot ulcer: Secondary | ICD-10-CM | POA: Diagnosis not present

## 2021-03-22 DIAGNOSIS — I110 Hypertensive heart disease with heart failure: Secondary | ICD-10-CM | POA: Diagnosis not present

## 2021-03-22 DIAGNOSIS — F17210 Nicotine dependence, cigarettes, uncomplicated: Secondary | ICD-10-CM | POA: Diagnosis not present

## 2021-03-22 DIAGNOSIS — I5022 Chronic systolic (congestive) heart failure: Secondary | ICD-10-CM | POA: Diagnosis not present

## 2021-03-22 DIAGNOSIS — G4730 Sleep apnea, unspecified: Secondary | ICD-10-CM | POA: Diagnosis not present

## 2021-03-22 NOTE — Progress Notes (Signed)
AYDDEN, CUMPIAN ([id number]) Visit Report for 03/22/2021 Chief Complaint Document Details Patient Name: Date of Service: [name], [name] [facility] 03/22/2021 10:15 A M Medical Record Number: [id number] Patient Account Number: [id number] Date of Birth/Sex: Treating RN: August 15, 1970 (51 y.o. M) Primary Care Provider: [name] Other Clinician: Referring Provider: Treating Provider/Extender: Gertie Fey in Treatment: 0 Information Obtained from: Patient Chief Complaint 03/22/2021: Right foot ulcer Electronic Signature(s) Signed: 03/22/2021 10:31:24 AM By: Duanne Guess MD FACS Entered By: Duanne Guess on 03/22/2021 10:31:24 -------------------------------------------------------------------------------- Debridement Details Patient Name: Date of Service: [name], [name] [facility] 03/22/2021 10:15 A M Medical Record Number: [id number] Patient Account Number: [id number] Date of Birth/Sex: Treating RN: [birth year] (51 y.o. Elizebeth Koller Primary Care Provider: [name] Other Clinician: Referring Provider: Treating Provider/Extender: Gertie Fey in Treatment: 0 Debridement Performed for Assessment: Wound #4 Right Metatarsal head first Performed By: Physician Duanne Guess, MD Debridement Type: Debridement Severity of Tissue Pre Debridement: Fat layer exposed Level of Consciousness (Pre-procedure): Awake and Alert Pre-procedure Verification/Time Out Yes - 10:05 Taken: Start Time: 10:05 T Area Debrided (L x W): otal 1 (cm) x 1 (cm) = 1 (cm) Tissue and other material debrided: Viable, Non-Viable, Callus, Subcutaneous Level: Skin/Subcutaneous Tissue Debridement Description: Excisional Instrument: Curette Bleeding: Minimum Hemostasis Achieved: Pressure End Time: 10:07 Procedural Pain: 0 Post Procedural Pain: 0 Response to Treatment: Procedure was tolerated well Level of Consciousness (Post- Awake and  Alert procedure): Post Debridement Measurements of Total Wound Length: (cm) 0.3 Width: (cm) 0.3 Depth: (cm) 0.5 Volume: (cm) 0.035 Character of Wound/Ulcer Post Debridement: Improved Severity of Tissue Post Debridement: Fat layer exposed Post Procedure Diagnosis Same as Pre-procedure Electronic Signature(s) Signed: 03/22/2021 12:34:58 PM By: Duanne Guess MD FACS Signed: 03/22/2021 5:43:07 PM By: Zandra Abts RN, BSN Entered By: Zandra Abts on 03/22/2021 10:10:49 -------------------------------------------------------------------------------- HPI Details Patient Name: Date of Service: [name], [name] [facility] 03/22/2021 10:15 A M Medical Record Number: [id number] Patient Account Number: [id number] Date of Birth/Sex: Treating RN: 12/11/1970 (51 y.o. M) Primary Care Provider: [name] Other Clinician: Referring Provider: Treating Provider/Extender: Gertie Fey in Treatment: 0 History of Present Illness HPI Description: ADMISSION 05/13/2019 This is a 51 year old man who has type 2 diabetes with peripheral neuropathy. He has a history of wounds on the plantar aspect of his left first and fourth toes. He has had these for several months. He was being seen in the wound care center at [facility] in [facility]. He apparently presented with swelling and an open wound at the tip of the toe. An MRI showed osteomyelitis. He was given 6 to 8 weeks of oral doxycycline again all of this via the patient we have none of these records. Since then he has been putting Goldbond on the areas wearing regular shoes. He was seen by his primary physician on 4/9 and sent down here for our review. In the primary care note it says he has been recommended for hyperbaric oxygen. Past medical history includes type 2 diabetes with peripheral neuropathy, heart failure with reduced ejection fraction 30 to 35%, peripheral arterial disease, coronary artery disease,  hyperlipidemia, hypertension, syncope and a prior history of a right great toe amputation also by Dr. Lynden Ang in [place] ABIs in our clinic were 0.91 on the left. Also notable that in 2019 he appears to have had arterial studies that are visible in our system. At that point the right ABI was 1.17, TBI  of 0.85 with triphasic waveforms. On the left his ABI was 1.19 with a TBI of 0.73 again with triphasic waveform 5/3; we did receive some information from [facility] wound care. The patient was seen there on 1/29. At that point he had wounds on the right foot at the amputation site the fifth digit fourth digit I am presuming on the right and then an area on the left first digit although that is not specifically stated, he has had a previous amputation on the right however. It would appear him at that time most of his ulcers were on the right the left foot is stated to have a lot of scales and calluses but not a lot of open wounds. The only wounds we define when he came in here last time were on the left first and left fourth toe He also had a wound culture that showed heavy growth of staph aureus and a slight growth of Stenotrophomonas maltophilia. The doxycycline should cover the staph aureus I am not sure about the stenotrophomonas. In any case he completed 6 weeks of this. UNFORTUNATELY I still do not have a copy of the MRI. And the exact justification for hyperbarics is therefore lacking. 5/10; the patient had osteomyelitis in the left fourth toe. He was treated for 6 weeks of doxycycline this wound is healed as is the left first toe. He was sent here for hyperbaric oxygen however at this point his wounds are healed and I think a course of watchful waiting and observation is in order. If the left fourth toe reopens then it is likely he will need a more protracted and aggressive treatment approach Readmission: 05/06/20 upon evaluation today patient presents for readmission here in the clinic exam issues  with his right foot on the medial aspect at the amputation site where his great toe was this is the first metatarsal area that is affected. He has been seeing Dr. Marcha Solders who performed the surgery. With that being said currently the patient was diagnosed as having MRSA on a culture that was + March 31. Has been on IV vancomycin for 6 weeks he is now on doxycycline as the MRSA was sensitive to Doxy and he does have 2 more refills he has been on that for 3 weeks already. He has MRI of January for showed L knee first metatarsal base early osteomyelitis versus possible reactive disease but nonetheless based on what was seen it appears osteomyelitis is more likely the cause here. He has had Apligraf although to be honest that did not seem to do the job. He is also been using Dakin's solution and currently is using Aquacel. Sharp debridement has been performed by Dr. Marcha Solders on a weekly basis according to what the patient tells me. With all that being said the patient was referred to [place] for further evaluation and treatment as unfortunately he does not seem to be responding well to his treatment with the surgeon. They have considered a total contact cast it sounds like but again with the infection that this was not a good idea. The patient does have a significant past medical history for diabetes mellitus type 2 for which she is on insulin, hypertension, significant congestive heart failure with a most recent ejection fraction of 30 to 35% and that was in 2020. He has not seen his cardiologist [name] since that time. In regard to his diabetes his A1c he does not even know he tells me his blood sugars run somewhere in the  200-300 range. With that being said he has not seen the primary care provider recently for management of this either. The patient tells me he is also a current smoker. He tells me he is trying to stop using nicotine pouches but he just does not have enough saliva he tells me his mouth is dry  all the time this is probably a subsequent issue from the diabetes as well. Unfortunately he just appears to be in a place where he is not necessarily at his healthiest even with his medical conditions. I think he would be a candidate for hyperbaric oxygen therapy but there are a lot of other things that need to be in place first before we get to that point. 05/13/2020 patient presents for 1 week follow-up. He has been using Hydrofera Blue every other day without any issues. He had to reschedule his cardiologist appointment due to transportation Issues. He has no complaints today. 05/20/2020 upon evaluation today patient appears to be doing well with regard to his foot ulcer all things considered. With that being said he tells me that he has been tolerating the dressing changes without complication. There does not appear to be any signs of active infection which is great news and overall very pleased with where things stand today. No fevers, chills, nausea, vomiting, or diarrhea. 05/27/2020 upon evaluation today patient appears to be doing well with regard to his wound on the foot. He does not require little bit of debridement today but overall he seems to be doing quite well. He actually has his echo on Monday we will see what that shows as well were hoping to be able to get him into the hyperbaric oxygen chamber but again a lot depends on what this test shows he will also need a chest x-ray if this turns out okay before going in the chamber. 06/03/2020 upon evaluation today patient's wound actually showing signs of minimal improvement. Fortunately I think that he is continuing to make great progress all things considered. He does have his echocardiogram next week. That is good news so we can see whether or not hyperbaric oxygen therapy would be appropriate for him I am hopeful we will be able to get him in the chamber sooner rather than later but again it all depends on his ejection fraction. Right now we  have been somewhat limited in being able to proceed with that as to be perfectly honest we just have not gotten the needed testing in order to go forward with the hyperbarics. If his ejection fraction is too low which could be the case then obviously is not good to be able to 5/25; patient presents for 1 week follow-up. He states he has his echo set up for next week. He overall feels well. He denies any issues over the past week. He denies signs of infection. 06/17/2020 upon evaluation today patient appears to be doing about the same in regard to his foot ulcer is measuring a little bit smaller today but still keeps developing the spongy tissue. I really think it may be a good idea for [place] to obtain a sample of a punch biopsy to send for evaluation. He is in agreement with this plan. Subsequently he did have his echocardiogram which showed that he has a systolic left ventricular function which is moderately decreased estimated to be 30 to 35%. He does have a follow-up appointment with the cardiologist on Monday to see what they think about this. I advised that he needs to  talk to them as well that what they feel about him potentially going into hyperbaric oxygen therapy if his heart would be sufficient for this or not. Obviously if they feel that it is something that would be sufficient then we need to have this in writing. Otherwise patient tells me that he has been doing fairly well is not really having any significant pain. 06/24/2020 upon evaluation today patient appears to be doing decently well in regard to his wound. In fact he does not have as much of the tissue that grew back this time. I do see that he had the results back from his skin biopsy. This showed reactive squamous hyperplasia. This again seems to be somewhat similar to lichen planus and how that would function. With that being said there was no malignancy and potentially this means it could be a result and a response to like a topical  steroid like triamcinolone. I believe this may at least be something to get a shot at this point. The patient is in agreement with the plan. I am very pleased though there is no malignancy noted. We also got a letter back from his cardiologist stating that he is from a cardiac perspective able to participate in hyperbaric oxygen therapy. Therefore if need be I think we can proceed as such. For that reason I am going to send in for the chest x-ray in order to ensure that we have what we need in place if we do need to proceed down that road. 07/01/2020 upon evaluation today patient appears to be doing decently well in regard to his wound. He has been tolerating the dressing changes without complication. Fortunately there does not appear to be any signs of active infection which is great news. No fever chills noted. He does note that he had a little bit of increased pain over the past week this seems to be kind of a neuropathy type issue to be honest. It does seem to be coming from his wound and I do not think it is coming from anywhere else in his foot although he does note some referred pain that seems to be traveling a little bit on him. Overall I think that in general this is just more of a bit of a mind game in that regard not that he is not hurting but rather where he is experiencing the pain at. Either way I do believe that the wound is doing much better as compared to what it was previous. 07/08/2020 upon evaluation today patient's wound actually appears to be doing excellent. He is making great progress and the triamcinolone has done excellent for him. There does not appear to be any signs of active infection which is great news. No fevers, chills, nausea, vomiting, or diarrhea. 07/16/2018 upon evaluation today patient appears to be doing well currently in regard to his wound. In fact this is getting very close to complete resolution. I am extremely pleased with where we see things standing today.  There does not appear to be any evidence of active infection which is great and overall happy in that regard as well. Fortunately the patient does not seem to be making any backslide as far as the wound is concerned and overall I really think that he is making excellent progress to be honest. No fevers, chills, nausea, vomiting, or diarrhea. 07/22/2020 upon evaluation today patient appears to be doing well with regard to his wound on initial inspection however he actually had some area of purulence  noted up underneath the apparently healed area. This appears to have callused over and then subsequently started draining. This is going require quite a bit of debridement today unfortunately. This is a little bit of a setback but hopefully not too much. READMISSION 03/17/2021: The patient returns to our clinic today after a little over 6 months absence. He had used all of his transportation vouchers and was unable to come to [place]. He has been followed at [facility] by a general surgeon there. Reading the electronic medical record, it sounds like not a significant amount of progress was made and the last several notes discussed leaving the eschar in place and allowing it to accumulate. From what I am able to discern, the last actual debridement took place in December 2022. The last progress note indicates that the surgeon wanted him to return here for ongoing management of a wound that has been quite challenging. ABI in clinic today was 0.62. I do not see any evidence of further vascular studies since 2019, at which time the only study performed was formal ankle- brachial and toe brachial indices. X-ray done in [place] on December 04, 2020: 1. 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 2. Prior first digit amputation. 3. Stable midfoot osteoarthritis. 4. No acute fracture or destructive bony lesion He continues to smoke, admitting to smoking  1 pack/day 03/22/2021: Initially, it appeared that the wound had nearly closed, but after some debridement of callus, I opened up a pocket. This does not probe to bone, but the tissue appears quite pale. I was unable to debride this significantly. Electronic Signature(s) Signed: 03/22/2021 10:32:44 AM By: Duanne Guessannon, Vanilla Heatherington MD FACS Entered By: Duanne Guessannon, Emily Forse on 03/22/2021 10:32:44 -------------------------------------------------------------------------------- Physical Exam Details Patient Name: Date of Service: [name], [name] 03/22/2021 10:15 A M Medical Record Number: [id number] Patient Account Number: [id number] Date of Birth/Sex: Treating RN: January 14, 1971 (51 y.o. M) Primary Care Provider: Lia HoppingHasanaj, Xaje Other Clinician: Referring Provider: Treating Provider/Extender: Gertie Feyannon, Lecretia Buczek Hasanaj, Xaje Weeks in Treatment: 0 Constitutional . . . . No acute distress. Respiratory Normal work of breathing on room air. Notes 03/22/2021: Wound examthere has not been reaccumulation of the massive amount of eschar and callus. There is a small amount and I debrided this back with a #5 curette. At the very center, I identified an open area with significant undermining. I removed the overlying tissue with a scalpel. This crater is fairly pale and I was unable to instigate any bleeding with the curette. Electronic Signature(s) Signed: 03/22/2021 10:40:40 AM By: Duanne Guessannon, Srija Southard MD FACS Entered By: Duanne Guessannon, Hillard Goodwine on 03/22/2021 10:40:40 -------------------------------------------------------------------------------- Physician Orders Details Patient Name: Date of Service: Jose CrawEYNO LDS [name], [name] 03/22/2021 10:15 A M Medical Record Number: [id number] Patient Account Number: [id number] Date of Birth/Sex: Treating RN: January 14, 1971 (51 y.o. Elizebeth KollerM) Lynch, Shatara Primary Care Provider: Lia HoppingHasanaj, Xaje Other Clinician: Referring Provider: Treating Provider/Extender: Gertie Feyannon, Dalan Cowger Hasanaj, Xaje Weeks in  Treatment: 0 Verbal / Phone Orders: No Diagnosis Coding ICD-10 Coding Code Description E11.42 Type 2 diabetes mellitus with diabetic polyneuropathy L97.519 Non-pressure chronic ulcer of other part of right foot with unspecified severity Follow-up Appointments ppointment in: - Wednesday 3/8 for cast change, then Wednesday 3/15 Return A Bathing/ Shower/ Hygiene May shower with protection but do not get wound dressing(s) wet. Off-Loading Total Contact Cast to Right Lower Extremity - right foot Additional Orders / Instructions Stop/Decrease Smoking Follow Nutritious Diet Home Health New wound care orders this week; continue  Home Health for wound care. May utilize formulary equivalent dressing for wound treatment orders unless otherwise specified. - Hold wound care this week, patient placed in total contact cast Other Home Health Orders/Instructions: - [facility] - Hold wound care this week Wound Treatment Wound #4 - Metatarsal head first Wound Laterality: Right Cleanser: Soap and Water 1 x Per Week/15 Days Discharge Instructions: May shower and wash wound with dial antibacterial soap and water prior to dressing change. Cleanser: Wound Cleanser 1 x Per Week/15 Days Discharge Instructions: Cleanse the wound with wound cleanser prior to applying a clean dressing using gauze sponges, not tissue or cotton balls. Prim Dressing: Hydrofera Blue Classic Foam, 2x2 in 1 x Per Week/15 Days ary Discharge Instructions: Moisten with saline prior to applying to wound bed Secondary Dressing: Woven Gauze Sponge, Non-Sterile 4x4 in 1 x Per Week/15 Days Discharge Instructions: Apply over primary dressing as directed. Secondary Dressing: Drawtex 4x4 in 1 x Per Week/15 Days Discharge Instructions: Apply over primary dressing as directed. Secured With: 10M Medipore H Soft Cloth Surgical T ape, 4 x 10 (in/yd) 1 x Per Week/15 Days Discharge Instructions: Secure with tape as directed. Electronic  Signature(s) Signed: 03/22/2021 12:34:58 PM By: Duanne Guess MD FACS Entered By: Duanne Guess on 03/22/2021 10:41:21 -------------------------------------------------------------------------------- Problem List Details Patient Name: Date of Service: [name], [name] [facility] 03/22/2021 10:15 A M Medical Record Number: [id number] Patient Account Number: [id number] Date of Birth/Sex: Treating RN: [birth year] (52 y.o. Elizebeth Koller Primary Care Provider: [name] Other Clinician: Referring Provider: Treating Provider/Extender: Gertie Fey in Treatment: 0 Active Problems ICD-10 Encounter Code Description Active Date MDM Diagnosis E11.42 Type 2 diabetes mellitus with diabetic polyneuropathy 03/17/2021 No Yes L97.519 Non-pressure chronic ulcer of other part of right foot with unspecified severity 03/17/2021 No Yes Inactive Problems Resolved Problems Electronic Signature(s) Signed: 03/22/2021 10:30:43 AM By: Duanne Guess MD FACS Entered By: Duanne Guess on 03/22/2021 10:30:43 -------------------------------------------------------------------------------- Progress Note Details Patient Name: Date of Service: [name], [name] [facility] 03/22/2021 10:15 A M Medical Record Number: [id number] Patient Account Number: [id number] Date of Birth/Sex: Treating RN: 10-11-1970 (51 y.o. M) Primary Care Provider: [name] Other Clinician: Referring Provider: Treating Provider/Extender: Gertie Fey in Treatment: 0 Subjective Chief Complaint Information obtained from Patient 03/22/2021: Right foot ulcer History of Present Illness (HPI) ADMISSION 05/13/2019 This is a 51 year old man who has type 2 diabetes with peripheral neuropathy. He has a history of wounds on the plantar aspect of his left first and fourth toes. He has had these for several months. He was being seen in the wound care center at [facility] in [facility]. He apparently presented with swelling and an open wound at the tip of the toe. An MRI showed osteomyelitis. He was given 6 to 8 weeks of oral doxycycline again all of this via the patient we have none of these records. Since then he has been putting Goldbond on the areas wearing regular shoes. He was seen by his primary physician on 4/9 and sent down here for our review. In the primary care note it says he has been recommended for hyperbaric oxygen. Past medical history includes type 2 diabetes with peripheral neuropathy, heart failure with reduced ejection fraction 30 to 35%, peripheral arterial disease, coronary artery disease, hyperlipidemia, hypertension, syncope and a prior history of a right great toe amputation also by Dr. Lynden Ang in [place] ABIs in our clinic were 0.91 on the left. Also  notable that in 2019 he appears to have had arterial studies that are visible in our system. At that point the right ABI was 1.17, TBI of 0.85 with triphasic waveforms. On the left his ABI was 1.19 with a TBI of 0.73 again with triphasic waveform 5/3; we did receive some information from [facility] wound care. The patient was seen there on 1/29. At that point he had wounds on the right foot at the amputation site the fifth digit fourth digit I am presuming on the right and then an area on the left first digit although that is not specifically stated, he has had a previous amputation on the right however. It would appear him at that time most of his ulcers were on the right the left foot is stated to have a lot of scales and calluses but not a lot of open wounds. The only wounds we define when he came in here last time were on the left first and left fourth toe He also had a wound culture that showed heavy growth of staph aureus and a slight growth of Stenotrophomonas maltophilia. The doxycycline should cover the staph aureus I am not sure about the stenotrophomonas. In any case he completed 6 weeks of  this. UNFORTUNATELY I still do not have a copy of the MRI. And the exact justification for hyperbarics is therefore lacking. 5/10; the patient had osteomyelitis in the left fourth toe. He was treated for 6 weeks of doxycycline this wound is healed as is the left first toe. He was sent here for hyperbaric oxygen however at this point his wounds are healed and I think a course of watchful waiting and observation is in order. If the left fourth toe reopens then it is likely he will need a more protracted and aggressive treatment approach Readmission: 05/06/20 upon evaluation today patient presents for readmission here in the clinic exam issues with his right foot on the medial aspect at the amputation site where his great toe was this is the first metatarsal area that is affected. He has been seeing Dr. Marcha Solders who performed the surgery. With that being said currently the patient was diagnosed as having MRSA on a culture that was + March 31. Has been on IV vancomycin for 6 weeks he is now on doxycycline as the MRSA was sensitive to Doxy and he does have 2 more refills he has been on that for 3 weeks already. He has MRI of January for showed L knee first metatarsal base early osteomyelitis versus possible reactive disease but nonetheless based on what was seen it appears osteomyelitis is more likely the cause here. He has had Apligraf although to be honest that did not seem to do the job. He is also been using Dakin's solution and currently is using Aquacel. Sharp debridement has been performed by Dr. Marcha Solders on a weekly basis according to what the patient tells me. With all that being said the patient was referred to [place] for further evaluation and treatment as unfortunately he does not seem to be responding well to his treatment with the surgeon. They have considered a total contact cast it sounds like but again with the infection that this was not a good idea. The patient does have a significant past  medical history for diabetes mellitus type 2 for which she is on insulin, hypertension, significant congestive heart failure with a most recent ejection fraction of 30 to 35% and that was in 2020. He has not seen his cardiologist  [name] since that time. In regard to his diabetes his A1c he does not even know he tells me his blood sugars run somewhere in the 200-300 range. With that being said he has not seen the primary care provider recently for management of this either. The patient tells me he is also a current smoker. He tells me he is trying to stop using nicotine pouches but he just does not have enough saliva he tells me his mouth is dry all the time this is probably a subsequent issue from the diabetes as well. Unfortunately he just appears to be in a place where he is not necessarily at his healthiest even with his medical conditions. I think he would be a candidate for hyperbaric oxygen therapy but there are a lot of other things that need to be in place first before we get to that point. 05/13/2020 patient presents for 1 week follow-up. He has been using Hydrofera Blue every other day without any issues. He had to reschedule his cardiologist appointment due to transportation Issues. He has no complaints today. 05/20/2020 upon evaluation today patient appears to be doing well with regard to his foot ulcer all things considered. With that being said he tells me that he has been tolerating the dressing changes without complication. There does not appear to be any signs of active infection which is great news and overall very pleased with where things stand today. No fevers, chills, nausea, vomiting, or diarrhea. 05/27/2020 upon evaluation today patient appears to be doing well with regard to his wound on the foot. He does not require little bit of debridement today but overall he seems to be doing quite well. He actually has his echo on Monday we will see what that shows as well were hoping to  be able to get him into the hyperbaric oxygen chamber but again a lot depends on what this test shows he will also need a chest x-ray if this turns out okay before going in the chamber. 06/03/2020 upon evaluation today patient's wound actually showing signs of minimal improvement. Fortunately I think that he is continuing to make great progress all things considered. He does have his echocardiogram next week. That is good news so we can see whether or not hyperbaric oxygen therapy would be appropriate for him I am hopeful we will be able to get him in the chamber sooner rather than later but again it all depends on his ejection fraction. Right now we have been somewhat limited in being able to proceed with that as to be perfectly honest we just have not gotten the needed testing in order to go forward with the hyperbarics. If his ejection fraction is too low which could be the case then obviously is not good to be able to 5/25; patient presents for 1 week follow-up. He states he has his echo set up for next week. He overall feels well. He denies any issues over the past week. He denies signs of infection. 06/17/2020 upon evaluation today patient appears to be doing about the same in regard to his foot ulcer is measuring a little bit smaller today but still keeps developing the spongy tissue. I really think it may be a good idea for [place] to obtain a sample of a punch biopsy to send for evaluation. He is in agreement with this plan. Subsequently he did have his echocardiogram which showed that he has a systolic left ventricular function which is moderately decreased estimated to be 30  to 35%. He does have a follow-up appointment with the cardiologist on Monday to see what they think about this. I advised that he needs to talk to them as well that what they feel about him potentially going into hyperbaric oxygen therapy if his heart would be sufficient for this or not. Obviously if they feel that it is  something that would be sufficient then we need to have this in writing. Otherwise patient tells me that he has been doing fairly well is not really having any significant pain. 06/24/2020 upon evaluation today patient appears to be doing decently well in regard to his wound. In fact he does not have as much of the tissue that grew back this time. I do see that he had the results back from his skin biopsy. This showed reactive squamous hyperplasia. This again seems to be somewhat similar to lichen planus and how that would function. With that being said there was no malignancy and potentially this means it could be a result and a response to like a topical steroid like triamcinolone. I believe this may at least be something to get a shot at this point. The patient is in agreement with the plan. I am very pleased though there is no malignancy noted. We also got a letter back from his cardiologist stating that he is from a cardiac perspective able to participate in hyperbaric oxygen therapy. Therefore if need be I think we can proceed as such. For that reason I am going to send in for the chest x-ray in order to ensure that we have what we need in place if we do need to proceed down that road. 07/01/2020 upon evaluation today patient appears to be doing decently well in regard to his wound. He has been tolerating the dressing changes without complication. Fortunately there does not appear to be any signs of active infection which is great news. No fever chills noted. He does note that he had a little bit of increased pain over the past week this seems to be kind of a neuropathy type issue to be honest. It does seem to be coming from his wound and I do not think it is coming from anywhere else in his foot although he does note some referred pain that seems to be traveling a little bit on him. Overall I think that in general this is just more of a bit of a mind game in that regard not that he is not hurting  but rather where he is experiencing the pain at. Either way I do believe that the wound is doing much better as compared to what it was previous. 07/08/2020 upon evaluation today patient's wound actually appears to be doing excellent. He is making great progress and the triamcinolone has done excellent for him. There does not appear to be any signs of active infection which is great news. No fevers, chills, nausea, vomiting, or diarrhea. 07/16/2018 upon evaluation today patient appears to be doing well currently in regard to his wound. In fact this is getting very close to complete resolution. I am extremely pleased with where we see things standing today. There does not appear to be any evidence of active infection which is great and overall happy in that regard as well. Fortunately the patient does not seem to be making any backslide as far as the wound is concerned and overall I really think that he is making excellent progress to be honest. No fevers, chills, nausea, vomiting, or diarrhea.  07/22/2020 upon evaluation today patient appears to be doing well with regard to his wound on initial inspection however he actually had some area of purulence noted up underneath the apparently healed area. This appears to have callused over and then subsequently started draining. This is going require quite a bit of debridement today unfortunately. This is a little bit of a setback but hopefully not too much. READMISSION 03/17/2021: The patient returns to our clinic today after a little over 6 months absence. He had used all of his transportation vouchers and was unable to come to [place]. He has been followed at [facility] by a general surgeon there. Reading the electronic medical record, it sounds like not a significant amount of progress was made and the last several notes discussed leaving the eschar in place and allowing it to accumulate. From what I am able to discern, the last actual debridement took place  in December 2022. The last progress note indicates that the surgeon wanted him to return here for ongoing management of a wound that has been quite challenging. ABI in clinic today was 0.62. I do not see any evidence of further vascular studies since 2019, at which time the only study performed was formal ankle- brachial and toe brachial indices. X-ray done in [place] on December 04, 2020: 1. 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 2. Prior first digit amputation. 3. Stable midfoot osteoarthritis. 4. No acute fracture or destructive bony lesion He continues to smoke, admitting to smoking 1 pack/day 03/22/2021: Initially, it appeared that the wound had nearly closed, but after some debridement of callus, I opened up a pocket. This does not probe to bone, but the tissue appears quite pale. I was unable to debride this significantly. Patient History Information obtained from Patient. Family History Unknown History, Cancer, Hypertension - Father, Lung Disease - Paternal Grandparents, No family history of Diabetes, Heart Disease, Hereditary Spherocytosis, Kidney Disease, Seizures, Stroke, Thyroid Problems, Tuberculosis. Social History Current every day smoker - 1 PPD, Marital Status - Separated, Alcohol Use - Rarely, Drug Use - No History, Caffeine Use - Daily - Coffee. Medical History Eyes Denies history of Cataracts, Glaucoma, Optic Neuritis Ear/Nose/Mouth/Throat Denies history of Chronic sinus problems/congestion, Middle ear problems Hematologic/Lymphatic Denies history of Anemia, Hemophilia, Human Immunodeficiency Virus, Lymphedema, Sickle Cell Disease Respiratory Patient has history of Sleep Apnea Denies history of Aspiration, Asthma, Chronic Obstructive Pulmonary Disease (COPD), Pneumothorax, Tuberculosis Cardiovascular Patient has history of Congestive Heart Failure, Hypertension Denies history of Angina, Arrhythmia,  Coronary Artery Disease, Deep Vein Thrombosis, Hypotension, Myocardial Infarction, Peripheral Arterial Disease, Peripheral Venous Disease, Phlebitis, Vasculitis Gastrointestinal Denies history of Cirrhosis , Colitis, Crohnoos, Hepatitis A, Hepatitis B, Hepatitis C Endocrine Patient has history of Type II Diabetes Denies history of Type I Diabetes Genitourinary Denies history of End Stage Renal Disease Immunological Denies history of Lupus Erythematosus, Raynaudoos, Scleroderma Integumentary (Skin) Denies history of History of Burn Musculoskeletal Denies history of Gout, Rheumatoid Arthritis, Osteoarthritis, Osteomyelitis Neurologic Patient has history of Neuropathy - bilateral legs Denies history of Dementia, Quadriplegia, Paraplegia, Seizure Disorder Oncologic Denies history of Received Chemotherapy, Received Radiation Psychiatric Denies history of Anorexia/bulimia, Confinement Anxiety Hospitalization/Surgery History - Appendectomy;Oral surgery;Right great toe amputation.. Medical A Surgical History Notes nd Eyes c/o right eye feeling dry Objective Constitutional No acute distress. Vitals Time Taken: 9:50 AM, Height: 72 in, Weight: 290 lbs, BMI: 39.3, Temperature: 98.5 F, Pulse: 84 bpm, Respiratory Rate: 20 breaths/min, Blood Pressure: 135/82 mmHg. Respiratory Normal work  of breathing on room air. General Notes: 03/22/2021: Wound examoothere has not been reaccumulation of the massive amount of eschar and callus. There is a small amount and I debrided this back with a #5 curette. At the very center, I identified an open area with significant undermining. I removed the overlying tissue with a scalpel. This crater is fairly pale and I was unable to instigate any bleeding with the curette. Integumentary (Hair, Skin) Wound #4 status is Open. Original cause of wound was Trauma. The date acquired was: 03/18/2019. The wound is located on the Right Metatarsal head first. The wound  measures 1cm length x 0.9cm width x 0.6cm depth; 0.707cm^2 area and 0.424cm^3 volume. There is Fat Layer (Subcutaneous Tissue) exposed. There is no tunneling or undermining noted. There is a medium amount of serosanguineous drainage noted. The wound margin is thickened. There is large (67-100%) red granulation within the wound bed. There is no necrotic tissue within the wound bed. 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 There was a Excisional Skin/Subcutaneous Tissue Debridement with a total area of 1 sq cm performed by Duanne Guess, MD. With the following instrument(s): Curette to remove Viable and Non-Viable tissue/material. Material removed includes Callus and Subcutaneous Tissue and. No specimens were taken. A time out was conducted at 10:05, prior to the start of the procedure. A Minimum amount of bleeding was controlled with Pressure. The procedure was tolerated well with a pain level of 0 throughout and a pain level of 0 following the procedure. Post Debridement Measurements: 0.3cm length x 0.3cm width x 0.5cm depth; 0.035cm^3 volume. Character of Wound/Ulcer Post Debridement is improved. Severity of Tissue Post Debridement is: Fat layer exposed. Post procedure Diagnosis Wound #4: Same as Pre-Procedure Pre-procedure diagnosis of Wound #4 is a Diabetic Wound/Ulcer of the Lower Extremity located on the Right Metatarsal head first . There was a T Contact otal Cast Procedure by Duanne Guess, MD. Post procedure Diagnosis Wound #4: Same as Pre-Procedure Plan Follow-up Appointments: Return Appointment in: - Wednesday 3/8 for cast change, then Wednesday  3/15 Bathing/ Shower/ Hygiene: May shower with protection but do not get wound dressing(s) wet. Off-Loading: T Contact Cast to Right Lower Extremity - right foot otal Additional Orders / Instructions: Stop/Decrease Smoking Follow Nutritious Diet Home Health: New wound care orders this week; continue Home Health for wound care. May utilize formulary equivalent dressing for wound treatment orders unless otherwise specified. - Hold wound care this week, patient placed in total contact cast Other Home Health Orders/Instructions: - [facility] - Hold wound care this week WOUND #4: - Metatarsal head first Wound Laterality: Right Cleanser: Soap and Water 1 x Per Week/15 Days Discharge Instructions: May shower and wash wound with dial antibacterial soap and water prior to dressing change. Cleanser: Wound Cleanser 1 x Per Week/15 Days Discharge Instructions: Cleanse the wound with wound cleanser prior to applying a clean dressing using gauze sponges, not tissue or cotton balls. Prim Dressing: Hydrofera Blue Classic Foam, 2x2 in 1 x Per Week/15 Days ary Discharge Instructions: Moisten with saline prior to applying to wound bed Secondary Dressing: Woven Gauze Sponge, Non-Sterile 4x4 in 1 x Per Week/15 Days Discharge Instructions: Apply over primary dressing as directed. Secondary Dressing:  Drawtex 4x4 in 1 x Per Week/15 Days Discharge Instructions: Apply over primary dressing as directed. Secured With: 43M Medipore H Soft Cloth Surgical T ape, 4 x 10 (in/yd) 1 x Per Week/15 Days Discharge Instructions: Secure with tape as directed. 03/22/2021: Initially, it appeared that the wound had nearly closed, but after some debridement, I opened up a pocket that had not previously been exposed. The tissue here does not appear particularly healthy, but it was quite tenacious and recalcitrant to debridement. We are going to put him in a total contact cast today. We will place Hydrofera Blue classic into  this opening. He will return to clinic on Wednesday for his first cast change and then I will see him a week later. Electronic Signature(s) Signed: 03/22/2021 10:43:10 AM By: Duanne Guessannon, Maybel Dambrosio MD FACS Entered By: Duanne Guessannon, Doretta Remmert on 03/22/2021 10:43:09 -------------------------------------------------------------------------------- HxROS Details Patient Name: Date of Service: Jose CrawEYNO LDS [name] 10:15 A M Medical Record Number: [id number] Patient Account Number: [id number] Date of Birth/Sex: Treating RN: December 25, 1970 (51 y.o. M) Primary Care Provider: Lia HoppingHasanaj, Xaje Other Clinician: Referring Provider: Treating Provider/Extender: Gertie Feyannon, Ravinder Lukehart Hasanaj, Xaje Weeks in Treatment: 0 Information Obtained From Patient Eyes Medical History: Negative for: Cataracts; Glaucoma; Optic Neuritis Past Medical History Notes: c/o right eye feeling dry Ear/Nose/Mouth/Throat Medical History: Negative for: Chronic sinus problems/congestion; Middle ear problems Hematologic/Lymphatic Medical History: Negative for: Anemia; Hemophilia; Human Immunodeficiency Virus; Lymphedema; Sickle Cell Disease Respiratory Medical History: Positive for: Sleep Apnea Negative for: Aspiration; Asthma; Chronic Obstructive Pulmonary Disease (COPD); Pneumothorax; Tuberculosis Cardiovascular Medical History: Positive for: Congestive Heart Failure; Hypertension Negative for: Angina; Arrhythmia; Coronary Artery Disease; Deep Vein Thrombosis; Hypotension; Myocardial Infarction; Peripheral Arterial Disease; Peripheral Venous Disease; Phlebitis; Vasculitis Gastrointestinal Medical History: Negative for: Cirrhosis ; Colitis; Crohns; Hepatitis A; Hepatitis B; Hepatitis C Endocrine Medical History: Positive for: Type II Diabetes Negative for: Type I Diabetes Time with diabetes: 14 years Treated with: Insulin, Oral agents Blood sugar tested every day: No Blood sugar testing results: Breakfast: 137; Bedtime:  93 Genitourinary Medical History: Negative for: End Stage Renal Disease Immunological Medical History: Negative for: Lupus Erythematosus; Raynauds; Scleroderma Integumentary (Skin) Medical History: Negative for: History of Burn Musculoskeletal Medical History: Negative for: Gout; Rheumatoid Arthritis; Osteoarthritis; Osteomyelitis Neurologic Medical History: Positive for: Neuropathy - bilateral legs Negative for: Dementia; Quadriplegia; Paraplegia; Seizure Disorder Oncologic Medical History: Negative for: Received Chemotherapy; Received Radiation Psychiatric Medical History: Negative for: Anorexia/bulimia; Confinement Anxiety Immunizations Pneumococcal Vaccine: Received Pneumococcal Vaccination: No Implantable Devices None Hospitalization / Surgery History Type of Hospitalization/Surgery Appendectomy;Oral surgery;Right great toe amputation. Family and Social History Unknown History: Yes; Cancer: Yes; Diabetes: No; Heart Disease: No; Hereditary Spherocytosis: No; Hypertension: Yes - Father; Kidney Disease: No; Lung Disease: Yes - Paternal Grandparents; Seizures: No; Stroke: No; Thyroid Problems: No; Tuberculosis: No; Current every day smoker - 1 PPD; Marital Status - Separated; Alcohol Use: Rarely; Drug Use: No History; Caffeine Use: Daily - Coffee; Financial Concerns: No; Food, Clothing or Shelter Needs: No; Support System Lacking: No; Transportation Concerns: No Physician Affirmation I have reviewed and agree with the above information. Electronic Signature(s) Signed: 03/22/2021 12:34:58 PM By: Duanne Guessannon, Boen Sterbenz MD FACS Entered By: Duanne Guessannon, Reneshia Zuccaro on 03/22/2021 10:38:47 -------------------------------------------------------------------------------- Advanced Modalities Screening Tool Details Patient Name: Date of Service: [name]22[facility] 10:15 A M Medical Record Number: [id number] Patient Account Number: [id number] Date of Birth/Sex: Treating  RN: December 25, 1970 (51 y.o. Elizebeth KollerM) Lynch, Shatara Primary Care Provider: Lia HoppingHasanaj, Xaje Other Clinician: Referring Provider: Treating Provider/Extender: Duanne Guessannon, Hillis Mcphatter  [name] Weeks in Treatment: 0 T Contact Cast Applied for Wound Assessment: otal Wound #4 Right Metatarsal head first Performed By: Physician Duanne Guess, MD Post Procedure Diagnosis Same as Pre-procedure Electronic Signature(s) Signed: 03/22/2021 12:34:58 PM By: Duanne Guess MD FACS Signed: 03/22/2021 5:43:07 PM By: Zandra Abts RN, BSN Entered By: Zandra Abts on 03/22/2021 10:10:31 -------------------------------------------------------------------------------- SuperBill Details Patient Name: Date of Service: [name], [name] [facility] 03/22/2021 Medical Record Number: [id number] Patient Account Number: [id number] Date of Birth/Sex: Treating RN: 02-Nov-1970 (50 y.o. M) Primary Care Provider: [name] Other Clinician: Referring Provider: Treating Provider/Extender: Gertie Fey in Treatment: 0 Diagnosis Coding ICD-10 Codes Code Description E11.42 Type 2 diabetes mellitus with diabetic polyneuropathy L97.519 Non-pressure chronic ulcer of other part of right foot with unspecified severity Facility Procedures CPT4 Code: [id number] Description: 11042 - DEB SUBQ TISSUE 20 SQ CM/< ICD-10 Diagnosis Description L97.519 Non-pressure chronic ulcer of other part of right foot with unspecified severi Modifier: ty Quantity: 1 Physician Procedures : CPT4 Code Description Modifier [id number] 11042 - WC PHYS SUBQ TISS 20 SQ CM ICD-10 Diagnosis Description L97.519 Non-pressure chronic ulcer of other part of right foot with unspecified severity Quantity: 1 Electronic Signature(s) Signed: 03/22/2021 10:43:23 AM By: Duanne Guess MD FACS Entered By: Duanne Guess on 03/22/2021 10:43:23

## 2021-03-23 DIAGNOSIS — E1169 Type 2 diabetes mellitus with other specified complication: Secondary | ICD-10-CM | POA: Diagnosis not present

## 2021-03-23 DIAGNOSIS — M869 Osteomyelitis, unspecified: Secondary | ICD-10-CM | POA: Diagnosis not present

## 2021-03-23 DIAGNOSIS — E11621 Type 2 diabetes mellitus with foot ulcer: Secondary | ICD-10-CM | POA: Diagnosis not present

## 2021-03-23 DIAGNOSIS — B9562 Methicillin resistant Staphylococcus aureus infection as the cause of diseases classified elsewhere: Secondary | ICD-10-CM | POA: Diagnosis not present

## 2021-03-23 DIAGNOSIS — E1140 Type 2 diabetes mellitus with diabetic neuropathy, unspecified: Secondary | ICD-10-CM | POA: Diagnosis not present

## 2021-03-23 DIAGNOSIS — L97412 Non-pressure chronic ulcer of right heel and midfoot with fat layer exposed: Secondary | ICD-10-CM | POA: Diagnosis not present

## 2021-03-23 NOTE — Progress Notes (Signed)
NICHOLE, [name] ([id number]) Visit Report for 03/22/2021 Arrival Information Details Patient Name: Date of Service: [name] 03/22/2021 10:15 A M Medical Record Number: [id number] Patient Account Number: [id number] Date of Birth/Sex: Treating RN: 08-26-1970 (51 y.o. M) Primary Care Elene Downum: [name] Other Clinician: Referring Eliyah Bazzi: Treating Laporsha Grealish/Extender: Gertie Fey in Treatment: 0 Visit Information History Since Last Visit Added or deleted any medications: No Patient Arrived: Cane Any new allergies or adverse reactions: No Arrival Time: 09:48 Had a fall or experienced change in No Accompanied By: self activities of daily living that may affect Transfer Assistance: None risk of falls: Patient Identification Verified: Yes Signs or symptoms of abuse/neglect since last visito No Secondary Verification Process Completed: Yes Hospitalized since last visit: No Patient Requires Transmission-Based Precautions: No Implantable device outside of the clinic excluding No Patient Has Alerts: No cellular tissue based products placed in the center since last visit: Has Dressing in Place as Prescribed: Yes Pain Present Now: No Electronic Signature(s) Signed: 03/23/2021 10:00:41 AM By: Karl Ito Entered By: Karl Ito on 03/22/2021 09:48:51 -------------------------------------------------------------------------------- Encounter Discharge Information Details Patient Name: Date of Service: [name] [facility] 03/22/2021 10:15 A M Medical Record Number: [id number] Patient Account Number: [id number] Date of Birth/Sex: Treating RN: 1970/02/04 (51 y.o. Elizebeth Koller Primary Care Kajah Santizo: [name] Other Clinician: Referring Azahel Belcastro: Treating Amrie Gurganus/Extender: Gertie Fey in Treatment: 0 Encounter Discharge Information Items Post Procedure Vitals Discharge Condition:  Stable Temperature (F): 98.5 Ambulatory Status: Cane Pulse (bpm): 84 Discharge Destination: Home Respiratory Rate (breaths/min): 20 Transportation: Private Auto Blood Pressure (mmHg): 135/82 Accompanied By: alone Schedule Follow-up Appointment: Yes Clinical Summary of Care: Patient Declined Electronic Signature(s) Signed: 03/22/2021 5:43:07 PM By: Zandra Abts RN, BSN Entered By: Zandra Abts on 03/22/2021 13:29:56 -------------------------------------------------------------------------------- Lower Extremity Assessment Details Patient Name: Date of Service: [name] [facility] 03/22/2021 10:15 A M Medical Record Number: [id number] Patient Account Number: [id number] Date of Birth/Sex: Treating RN: [birth year] (51 y.o. Elizebeth Koller Primary Care Wren Pryce: [name] Other Clinician: Referring Therron Sells: Treating Chevon Laufer/Extender: Sunday Corn Weeks in Treatment: 0 Edema Assessment Assessed: [name]: No] Franne Forts: No] E[Left: dema] [Right: :] Calf Left: Right: Point of Measurement: 34 cm From Medial Instep 41 cm Ankle Left: Right: Point of Measurement: 11 cm From Medial Instep 23 cm Vascular Assessment Pulses: Dorsalis Pedis Palpable: [Right:Yes] Electronic Signature(s) Signed: 03/22/2021 5:43:07 PM By: Zandra Abts RN, BSN Entered By: Zandra Abts on 03/22/2021 09:59:50 -------------------------------------------------------------------------------- Multi Wound Chart Details Patient Name: Date of Service: [name] [facility] 03/22/2021 10:15 A M Medical Record Number: [id number] Patient Account Number: [id number] Date of Birth/Sex: Treating RN: [birth year] (51 y.o. M) Primary Care Lachrisha Ziebarth: [name] Other Clinician: Referring Emori Kamau: Treating Nazim Kadlec/Extender: Gertie Fey in Treatment: 0 Vital Signs Height(in): 72 Pulse(bpm): 84 Weight(lbs): 290 Blood Pressure(mmHg): 135/82 Body Mass  Index(BMI): 39.3 Temperature(F): 98.5 Respiratory Rate(breaths/min): 20 Photos: [4:Right Metatarsal head first] [N/A:N/A N/A] Wound Location: [4:Trauma] [N/A:N/A] Wounding Event: [4:Diabetic Wound/Ulcer of the Lower] [N/A:N/A] Primary Etiology: [4:Extremity Sleep Apnea, Congestive Heart] [N/A:N/A] Comorbid History: [4:Failure, Hypertension, Type II Diabetes, Neuropathy 03/18/2019] [N/A:N/A] Date Acquired: [4:0] [N/A:N/A] Weeks of Treatment: [4:Open] [N/A:N/A] Wound Status: [4:No] [N/A:N/A] Wound Recurrence: [4:1x0.9x0.6] [N/A:N/A] Measurements L x W x D (cm) [4:0.707] [N/A:N/A] A (cm) : rea [4:0.424] [N/A:N/A] Volume (cm) : [4:91.50%] [N/A:N/A] % Reduction in A [4:rea: 48.90%] [N/A:N/A] % Reduction in Volume: [4:Grade 2] [N/A:N/A] Classification: [4:Medium] [N/A:N/A]  Exudate A mount: [4:Serosanguineous] [N/A:N/A] Exudate Type: [4:red, brown] [N/A:N/A] Exudate Color: [4:Thickened] [N/A:N/A] Wound Margin: [4:Large (67-100%)] [N/A:N/A] Granulation A mount: [4:Red] [N/A:N/A] Granulation Quality: [4:None Present (0%)] [N/A:N/A] Necrotic A mount: [4:Fat Layer (Subcutaneous Tissue): Yes N/A] Exposed Structures: [4:Fascia: No Tendon: No Muscle: No Joint: No Bone: No Large (67-100%)] [N/A:N/A] Epithelialization: [4:Debridement - Excisional] [N/A:N/A] Debridement: Pre-procedure Verification/Time Out 10:05 [N/A:N/A] Taken: [4:Callus, Subcutaneous] [N/A:N/A] Tissue Debrided: [4:Skin/Subcutaneous Tissue] [N/A:N/A] Level: [4:1] [N/A:N/A] Debridement A (sq cm): [4:rea Curette] [N/A:N/A] Instrument: [4:Minimum] [N/A:N/A] Bleeding: [4:Pressure] [N/A:N/A] Hemostasis A chieved: [4:0] [N/A:N/A] Procedural Pain: [4:0] [N/A:N/A] Post Procedural Pain: [4:Procedure was tolerated well] [N/A:N/A] Debridement Treatment Response: [4:0.3x0.3x0.5] [N/A:N/A] Post Debridement Measurements L x W x D (cm) [4:0.035] [N/A:N/A] Post Debridement Volume: (cm) [4:Debridement] [N/A:N/A] Procedures Performed:  [4:T Contact Cast otal] Treatment Notes Electronic Signature(s) Signed: 03/22/2021 10:31:01 AM By: Duanne Guess MD FACS Entered By: Duanne Guess on 03/22/2021 10:31:01 -------------------------------------------------------------------------------- Multi-Disciplinary Care Plan Details Patient Name: Date of Service: [name] [facility] 03/22/2021 10:15 A M Medical Record Number: [id number] Patient Account Number: [id number] Date of Birth/Sex: Treating RN: 1970/01/21 (51 y.o. Elizebeth Koller Primary Care Shelli Portilla: [name] Other Clinician: Referring Georgie Haque: Treating Herberta Pickron/Extender: Gertie Fey in Treatment: 0 Active Inactive Wound/Skin Impairment Nursing Diagnoses: Impaired tissue integrity Knowledge deficit related to smoking impact on wound healing Knowledge deficit related to ulceration/compromised skin integrity Goals: Patient will demonstrate a reduced rate of smoking or cessation of smoking Date Initiated: 03/17/2021 Target Resolution Date: 04/23/2021 Goal Status: Active Patient/caregiver will verbalize understanding of skin care regimen Date Initiated: 03/22/2021 Target Resolution Date: 04/23/2021 Goal Status: Active Interventions: Assess patient/caregiver ability to perform ulcer/skin care regimen upon admission and as needed Assess ulceration(s) every visit Provide education on smoking Provide education on ulcer and skin care Notes: Electronic Signature(s) Signed: 03/22/2021 5:43:07 PM By: Zandra Abts RN, BSN Entered By: Zandra Abts on 03/22/2021 10:05:48 -------------------------------------------------------------------------------- Pain Assessment Details Patient Name: Date of Service: [name] [facility] 03/22/2021 10:15 A M Medical Record Number: [id number] Patient Account Number: [id number] Date of Birth/Sex: Treating RN: [birth year] (50 y.o. M) Primary Care Edem Tiegs: [name] Other Clinician: Referring  Tamasha Laplante: Treating Murlin Schrieber/Extender: Gertie Fey in Treatment: 0 Active Problems Location of Pain Severity and Description of Pain Patient Has Paino No Site Locations Pain Management and Medication Current Pain Management: Electronic Signature(s) Signed: 03/23/2021 10:00:41 AM By: Karl Ito Entered By: Karl Ito on 03/22/2021 09:50:49 -------------------------------------------------------------------------------- Patient/Caregiver Education Details Patient Name: Date of Service: [name], RA [name] 3/6/2023andnbsp10:15 A M Medical Record Number: [id number] Patient Account Number: [id number] Date of Birth/Gender: Treating RN: January 10, 1971 (51 y.o. Elizebeth Koller Primary Care Physician: [name] Other Clinician: Referring Physician: Treating Physician/Extender: Gertie Fey in Treatment: 0 Education Assessment Education Provided To: Patient Education Topics Provided Wound/Skin Impairment: Methods: Explain/Verbal Responses: State content correctly Electronic Signature(s) Signed: 03/22/2021 5:43:07 PM By: Zandra Abts RN, BSN Entered By: Zandra Abts on 03/22/2021 13:27:59 -------------------------------------------------------------------------------- Wound Assessment Details Patient Name: Date of Service: [name], RA [name] [name] 03/22/2021 10:15 A M Medical Record Number: [id number] Patient Account Number: [id number] Date of Birth/Sex: Treating RN: July 20, 1970 (51 y.o. Elizebeth Koller Primary Care Isa Kohlenberg: [name] Other Clinician: Referring Angelina Venard: Treating Lynise Porr/Extender: Sunday Corn Weeks in Treatment: 0 Wound Status Wound Number: 4 Primary Diabetic Wound/Ulcer of the Lower Extremity Etiology: Wound Location: Right Metatarsal head first Wound Open Wounding Event: Trauma Status: Date Acquired: 03/18/2019 Comorbid  Sleep Apnea, Congestive Heart Failure,  Hypertension, Type II Weeks Of Treatment: 0 History: Diabetes, Neuropathy Clustered Wound: No Photos Wound Measurements Length: (cm) 1 Width: (cm) 0.9 Depth: (cm) 0.6 Area: (cm) 0.707 Volume: (cm) 0.424 % Reduction in Area: 91.5% % Reduction in Volume: 48.9% Epithelialization: Large (67-100%) Tunneling: No Undermining: No Wound Description Classification: Grade 2 Wound Margin: Thickened Exudate Amount: Medium Exudate Type: Serosanguineous Exudate Color: red, brown Foul Odor After Cleansing: No Slough/Fibrino Yes Wound Bed Granulation Amount: Large (67-100%) Exposed Structure Granulation Quality: Red Fascia Exposed: No Necrotic Amount: None Present (0%) Fat Layer (Subcutaneous Tissue) Exposed: Yes Tendon Exposed: No Muscle Exposed: No Joint Exposed: No Bone Exposed: No Treatment Notes Wound #4 (Metatarsal head first) Wound Laterality: Right Cleanser Soap and Water Discharge Instruction: May shower and wash wound with dial antibacterial soap and water prior to dressing change. Wound Cleanser Discharge Instruction: Cleanse the wound with wound cleanser prior to applying a clean dressing using gauze sponges, not tissue or cotton balls. Peri-Wound Care Topical Primary Dressing Hydrofera Blue Classic Foam, 2x2 in Discharge Instruction: Moisten with saline prior to applying to wound bed Secondary Dressing Woven Gauze Sponge, Non-Sterile 4x4 in Discharge Instruction: Apply over primary dressing as directed. Drawtex 4x4 in Discharge Instruction: Apply over primary dressing as directed. Secured With 83M Medipore H Soft Cloth Surgical T ape, 4 x 10 (in/yd) Discharge Instruction: Secure with tape as directed. Compression Wrap Compression Stockings Add-Ons Electronic Signature(s) Signed: 03/22/2021 5:43:07 PM By: Zandra Abts RN, BSN Entered By: Zandra Abts on 03/22/2021  10:20:04 -------------------------------------------------------------------------------- Vitals Details Patient Name: Date of Service: [name] [facility] 03/22/2021 10:15 A M Medical Record Number: [id number] Patient Account Number: [id number] Date of Birth/Sex: Treating RN: [birth year] (51 y.o. M) Primary Care Kaylyne Axton: [name] Other Clinician: Referring Trinten Boudoin: Treating Gregg Winchell/Extender: Gertie Fey in Treatment: 0 Vital Signs Time Taken: 09:50 Temperature (F): 98.5 Height (in): 72 Pulse (bpm): 84 Weight (lbs): 290 Respiratory Rate (breaths/min): 20 Body Mass Index (BMI): 39.3 Blood Pressure (mmHg): 135/82 Reference Range: 80 - 120 mg / dl Electronic Signature(s) Signed: 03/23/2021 10:00:41 AM By: Karl Ito Entered By: Karl Ito on 03/22/2021 09:50:45

## 2021-03-24 ENCOUNTER — Other Ambulatory Visit: Payer: Self-pay

## 2021-03-24 ENCOUNTER — Encounter (HOSPITAL_BASED_OUTPATIENT_CLINIC_OR_DEPARTMENT_OTHER): Payer: Medicare Other | Admitting: General Surgery

## 2021-03-24 DIAGNOSIS — G4730 Sleep apnea, unspecified: Secondary | ICD-10-CM | POA: Diagnosis not present

## 2021-03-24 DIAGNOSIS — E1151 Type 2 diabetes mellitus with diabetic peripheral angiopathy without gangrene: Secondary | ICD-10-CM | POA: Diagnosis not present

## 2021-03-24 DIAGNOSIS — E1142 Type 2 diabetes mellitus with diabetic polyneuropathy: Secondary | ICD-10-CM | POA: Diagnosis not present

## 2021-03-24 DIAGNOSIS — I2510 Atherosclerotic heart disease of native coronary artery without angina pectoris: Secondary | ICD-10-CM | POA: Diagnosis not present

## 2021-03-24 DIAGNOSIS — I5022 Chronic systolic (congestive) heart failure: Secondary | ICD-10-CM | POA: Diagnosis not present

## 2021-03-24 DIAGNOSIS — L97519 Non-pressure chronic ulcer of other part of right foot with unspecified severity: Secondary | ICD-10-CM | POA: Diagnosis not present

## 2021-03-24 DIAGNOSIS — I110 Hypertensive heart disease with heart failure: Secondary | ICD-10-CM | POA: Diagnosis not present

## 2021-03-24 DIAGNOSIS — F17210 Nicotine dependence, cigarettes, uncomplicated: Secondary | ICD-10-CM | POA: Diagnosis not present

## 2021-03-24 DIAGNOSIS — L97512 Non-pressure chronic ulcer of other part of right foot with fat layer exposed: Secondary | ICD-10-CM | POA: Diagnosis not present

## 2021-03-24 DIAGNOSIS — E11621 Type 2 diabetes mellitus with foot ulcer: Secondary | ICD-10-CM | POA: Diagnosis not present

## 2021-03-24 NOTE — Progress Notes (Signed)
[name] ([id number]) Visit Report for 03/24/2021 Chief Complaint Document Details Patient Name: Date of Service: [name], [name] [facility] 03/24/2021 2:00 PM Medical Record Number: [id number] Patient Account Number: [id number] Date of Birth/Sex: Treating RN: 23-Jul-1970 (51 y.o. M) Primary Care Provider: Lia [name] Other Clinician: Referring Provider: Treating Provider/Extender: [name] in Treatment: 1 Information Obtained from: Patient Chief Complaint 03/22/2021: Right foot ulcer Electronic Signature(s) Signed: 03/24/2021 2:35:00 PM By: Duanne Guess MD FACS Entered By: Duanne Guess on 03/24/2021 14:35:00 -------------------------------------------------------------------------------- HPI Details Patient Name: Date of Service: [name], [name] [facility] 03/24/2021 2:00 PM Medical Record Number: [id number] Patient Account Number: [id number] Date of Birth/Sex: Treating RN: 09/03/1970 (50 y.o. M) Primary Care Provider: Lia [name] Other Clinician: Referring Provider: Treating Provider/Extender: [name] in Treatment: 1 History of Present Illness HPI Description: ADMISSION 05/13/2019 This is a 51 year old man who has type 2 diabetes with peripheral neuropathy. He has a history of wounds on the plantar aspect of his left first and fourth toes. He has had these for several months. He was being seen in the wound care center at [facility] in [facility]. He apparently presented with swelling and an open wound at the tip of the toe. An MRI showed osteomyelitis. He was given 6 to 8 weeks of oral doxycycline again all of this via the patient we have none of these records. Since then he has been putting Goldbond on the areas wearing regular shoes. He was seen by his primary physician on 4/9 and sent down here for our review. In the primary care note it says he has been recommended for hyperbaric oxygen. Past  medical history includes type 2 diabetes with peripheral neuropathy, heart failure with reduced ejection fraction 30 to 35%, peripheral arterial disease, coronary artery disease, hyperlipidemia, hypertension, syncope and a prior history of a right great toe amputation also by Dr. Lynden Ang in [place] ABIs in our clinic were 0.91 on the left. Also notable that in 2019 he appears to have had arterial studies that are visible in our system. At that point the right ABI was 1.17, TBI of 0.85 with triphasic waveforms. On the left his ABI was 1.19 with a TBI of 0.73 again with triphasic waveform 5/3; we did receive some information from [facility] wound care. The patient was seen there on 1/29. At that point he had wounds on the right foot at the amputation site the fifth digit fourth digit I am presuming on the right and then an area on the left first digit although that is not specifically stated, he has had a previous amputation on the right however. It would appear him at that time most of his ulcers were on the right the left foot is stated to have a lot of scales and calluses but not a lot of open wounds. The only wounds we define when he came in here last time were on the left first and left fourth toe He also had a wound culture that showed heavy growth of staph aureus and a slight growth of Stenotrophomonas maltophilia. The doxycycline should cover the staph aureus I am not sure about the stenotrophomonas. In any case he completed 6 weeks of this. UNFORTUNATELY I still do not have a copy of the MRI. And the exact justification for hyperbarics is therefore lacking. 5/10; the patient had osteomyelitis in the left fourth toe. He was treated for 6 weeks of doxycycline this wound is healed  as is the left first toe. He was sent here for hyperbaric oxygen however at this point his wounds are healed and I think a course of watchful waiting and observation is in order. If the left fourth toe reopens then it is  likely he will need a more protracted and aggressive treatment approach Readmission: 05/06/20 upon evaluation today patient presents for readmission here in the clinic exam issues with his right foot on the medial aspect at the amputation site where his great toe was this is the first metatarsal area that is affected. He has been seeing Dr. Marcha Solders who performed the surgery. With that being said currently the patient was diagnosed as having MRSA on a culture that was + March 31. Has been on IV vancomycin for 6 weeks he is now on doxycycline as the MRSA was sensitive to Doxy and he does have 2 more refills he has been on that for 3 weeks already. He has MRI of January for showed L knee first metatarsal base early osteomyelitis versus possible reactive disease but nonetheless based on what was seen it appears osteomyelitis is more likely the cause here. He has had Apligraf although to be honest that did not seem to do the job. He is also been using Dakin's solution and currently is using Aquacel. Sharp debridement has been performed by Dr. Marcha Solders on a weekly basis according to what the patient tells me. With all that being said the patient was referred to [place] for further evaluation and treatment as unfortunately he does not seem to be responding well to his treatment with the surgeon. They have considered a total contact cast it sounds like but again with the infection that this was not a good idea. The patient does have a significant past medical history for diabetes mellitus type 2 for which she is on insulin, hypertension, significant congestive heart failure with a most recent ejection fraction of 30 to 35% and that was in 2020. He has not seen his cardiologist [name] since that time. In regard to his diabetes his A1c he does not even know he tells me his blood sugars run somewhere in the 200-300 range. With that being said he has not seen the primary care provider recently for management of this  either. The patient tells me he is also a current smoker. He tells me he is trying to stop using nicotine pouches but he just does not have enough saliva he tells me his mouth is dry all the time this is probably a subsequent issue from the diabetes as well. Unfortunately he just appears to be in a place where he is not necessarily at his healthiest even with his medical conditions. I think he would be a candidate for hyperbaric oxygen therapy but there are a lot of other things that need to be in place first before we get to that point. 05/13/2020 patient presents for 1 week follow-up. He has been using Hydrofera Blue every other day without any issues. He had to reschedule his cardiologist appointment due to transportation Issues. He has no complaints today. 05/20/2020 upon evaluation today patient appears to be doing well with regard to his foot ulcer all things considered. With that being said he tells me that he has been tolerating the dressing changes without complication. There does not appear to be any signs of active infection which is great news and overall very pleased with where things stand today. No fevers, chills, nausea, vomiting, or diarrhea. 05/27/2020 upon evaluation  today patient appears to be doing well with regard to his wound on the foot. He does not require little bit of debridement today but overall he seems to be doing quite well. He actually has his echo on Monday we will see what that shows as well were hoping to be able to get him into the hyperbaric oxygen chamber but again a lot depends on what this test shows he will also need a chest x-ray if this turns out okay before going in the chamber. 06/03/2020 upon evaluation today patient's wound actually showing signs of minimal improvement. Fortunately I think that he is continuing to make great progress all things considered. He does have his echocardiogram next week. That is good news so we can see whether or not hyperbaric  oxygen therapy would be appropriate for him I am hopeful we will be able to get him in the chamber sooner rather than later but again it all depends on his ejection fraction. Right now we have been somewhat limited in being able to proceed with that as to be perfectly honest we just have not gotten the needed testing in order to go forward with the hyperbarics. If his ejection fraction is too low which could be the case then obviously is not good to be able to 5/25; patient presents for 1 week follow-up. He states he has his echo set up for next week. He overall feels well. He denies any issues over the past week. He denies signs of infection. 06/17/2020 upon evaluation today patient appears to be doing about the same in regard to his foot ulcer is measuring a little bit smaller today but still keeps developing the spongy tissue. I really think it may be a good idea for [place] to obtain a sample of a punch biopsy to send for evaluation. He is in agreement with this plan. Subsequently he did have his echocardiogram which showed that he has a systolic left ventricular function which is moderately decreased estimated to be 30 to 35%. He does have a follow-up appointment with the cardiologist on Monday to see what they think about this. I advised that he needs to talk to them as well that what they feel about him potentially going into hyperbaric oxygen therapy if his heart would be sufficient for this or not. Obviously if they feel that it is something that would be sufficient then we need to have this in writing. Otherwise patient tells me that he has been doing fairly well is not really having any significant pain. 06/24/2020 upon evaluation today patient appears to be doing decently well in regard to his wound. In fact he does not have as much of the tissue that grew back this time. I do see that he had the results back from his skin biopsy. This showed reactive squamous hyperplasia. This again seems to be  somewhat similar to lichen planus and how that would function. With that being said there was no malignancy and potentially this means it could be a result and a response to like a topical steroid like triamcinolone. I believe this may at least be something to get a shot at this point. The patient is in agreement with the plan. I am very pleased though there is no malignancy noted. We also got a letter back from his cardiologist stating that he is from a cardiac perspective able to participate in hyperbaric oxygen therapy. Therefore if need be I think we can proceed as such. For that reason I  am going to send in for the chest x-ray in order to ensure that we have what we need in place if we do need to proceed down that road. 07/01/2020 upon evaluation today patient appears to be doing decently well in regard to his wound. He has been tolerating the dressing changes without complication. Fortunately there does not appear to be any signs of active infection which is great news. No fever chills noted. He does note that he had a little bit of increased pain over the past week this seems to be kind of a neuropathy type issue to be honest. It does seem to be coming from his wound and I do not think it is coming from anywhere else in his foot although he does note some referred pain that seems to be traveling a little bit on him. Overall I think that in general this is just more of a bit of a mind game in that regard not that he is not hurting but rather where he is experiencing the pain at. Either way I do believe that the wound is doing much better as compared to what it was previous. 07/08/2020 upon evaluation today patient's wound actually appears to be doing excellent. He is making great progress and the triamcinolone has done excellent for him. There does not appear to be any signs of active infection which is great news. No fevers, chills, nausea, vomiting, or diarrhea. 07/16/2018 upon evaluation today  patient appears to be doing well currently in regard to his wound. In fact this is getting very close to complete resolution. I am extremely pleased with where we see things standing today. There does not appear to be any evidence of active infection which is great and overall happy in that regard as well. Fortunately the patient does not seem to be making any backslide as far as the wound is concerned and overall I really think that he is making excellent progress to be honest. No fevers, chills, nausea, vomiting, or diarrhea. 07/22/2020 upon evaluation today patient appears to be doing well with regard to his wound on initial inspection however he actually had some area of purulence noted up underneath the apparently healed area. This appears to have callused over and then subsequently started draining. This is going require quite a bit of debridement today unfortunately. This is a little bit of a setback but hopefully not too much. READMISSION 03/17/2021: The patient returns to our clinic today after a little over 6 months absence. He had used all of his transportation vouchers and was unable to come to [place]. He has been followed at [facility] by a general surgeon there. Reading the electronic medical record, it sounds like not a significant amount of progress was made and the last several notes discussed leaving the eschar in place and allowing it to accumulate. From what I am able to discern, the last actual debridement took place in December 2022. The last progress note indicates that the surgeon wanted him to return here for ongoing management of a wound that has been quite challenging. ABI in clinic today was 0.62. I do not see any evidence of further vascular studies since 2019, at which time the only study performed was formal ankle- brachial and toe brachial indices. X-ray done in [place] on December 04, 2020: 1. 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 2. Prior first digit amputation. 3. Stable midfoot osteoarthritis. 4. No acute fracture or destructive bony lesion  He continues to smoke, admitting to smoking 1 pack/day 03/22/2021: Initially, it appeared that the wound had nearly closed, but after some debridement of callus, I opened up a pocket. This does not probe to bone, but the tissue appears quite pale. I was unable to debride this significantly. 03/24/2021: Here for first total contact cast change. Electronic Signature(s) Signed: 03/24/2021 2:35:22 PM By: Duanne Guess MD FACS Entered By: Duanne Guess on 03/24/2021 14:35:22 -------------------------------------------------------------------------------- Physical Exam Details Patient Name: Date of Service: [name], [name] [facility] 03/24/2021 2:00 PM Medical Record Number: [id number] Patient Account Number: [id number] Date of Birth/Sex: Treating RN: January 16, 1971 (51 y.o. M) Primary Care Provider: Lia [name] Other Clinician: Referring Provider: Treating Provider/Extender: [name] in Treatment: 1 Constitutional . . . No acute distress. Respiratory Normal work of breathing on room air. Notes 03/24/2021: No significant change from 2 days ago. Electronic Signature(s) Signed: 03/24/2021 2:36:11 PM By: Duanne Guess MD FACS Entered By: Duanne Guess on 03/24/2021 14:36:10 -------------------------------------------------------------------------------- Physician Orders Details Patient Name: Date of Service: [name], [name] [facility] 03/24/2021 2:00 PM Medical Record Number: [id number] Patient Account Number: [id number] Date of Birth/Sex: Treating RN: 12-01-1970 (51 y.o. Elizebeth Koller Primary Care Provider: Lia [name] Other Clinician: Referring Provider: Treating Provider/Extender: [name] in Treatment: 1 Verbal / Phone Orders: No Diagnosis Coding ICD-10 Coding Code  Description E11.42 Type 2 diabetes mellitus with diabetic polyneuropathy L97.519 Non-pressure chronic ulcer of other part of right foot with unspecified severity Follow-up Appointments ppointment in 1 week. - Dr. Lady Gary Return A Bathing/ Shower/ Hygiene May shower with protection but do not get wound dressing(s) wet. Off-Loading Total Contact Cast to Right Lower Extremity - right foot Additional Orders / Instructions Stop/Decrease Smoking Follow Nutritious Diet Home Health New wound care orders this week; continue Home Health for wound care. May utilize formulary equivalent dressing for wound treatment orders unless otherwise specified. - Hold wound care this week, patient placed in total contact cast Other Home Health Orders/Instructions: - [facility] - Hold wound care this week Wound Treatment Wound #4 - Metatarsal head first Wound Laterality: Right Cleanser: Soap and Water 1 x Per Week/15 Days Discharge Instructions: May shower and wash wound with dial antibacterial soap and water prior to dressing change. Cleanser: Wound Cleanser 1 x Per Week/15 Days Discharge Instructions: Cleanse the wound with wound cleanser prior to applying a clean dressing using gauze sponges, not tissue or cotton balls. Prim Dressing: Hydrofera Blue Classic Foam, 2x2 in 1 x Per Week/15 Days ary Discharge Instructions: Moisten with saline prior to applying to wound bed Secondary Dressing: Woven Gauze Sponge, Non-Sterile 4x4 in 1 x Per Week/15 Days Discharge Instructions: Apply over primary dressing as directed. Secondary Dressing: Drawtex 4x4 in 1 x Per Week/15 Days Discharge Instructions: Apply over primary dressing as directed. Secured With: 86M Medipore H Soft Cloth Surgical T ape, 4 x 10 (in/yd) 1 x Per Week/15 Days Discharge Instructions: Secure with tape as directed. Electronic Signature(s) Signed: 03/24/2021 5:13:33 PM By: Duanne Guess MD FACS Signed: 03/24/2021 6:27:47 PM By: Zandra Abts RN, BSN Entered By: Zandra Abts on 03/24/2021 17:12:11 -------------------------------------------------------------------------------- Problem List Details Patient Name: Date of Service: [name], [name] [facility] 03/24/2021 2:00 PM Medical Record Number: [id number] Patient Account Number: [id number] Date of Birth/Sex: Treating RN: 1971-01-09 (50 y.o. M) Primary Care Provider: Lia [name] Other Clinician: Referring Provider: Treating Provider/Extender: [name] in Treatment: 1 Active Problems ICD-10 Encounter Code  Description Active Date MDM Diagnosis E11.42 Type 2 diabetes mellitus with diabetic polyneuropathy 03/17/2021 No Yes L97.519 Non-pressure chronic ulcer of other part of right foot with unspecified severity 03/17/2021 No Yes Inactive Problems Resolved Problems Electronic Signature(s) Signed: 03/24/2021 2:34:44 PM By: Duanne Guess MD FACS Entered By: Duanne Guess on 03/24/2021 14:34:43 -------------------------------------------------------------------------------- Progress Note Details Patient Name: Date of Service: [name], [name] [facility] 03/24/2021 2:00 PM Medical Record Number: [id number] Patient Account Number: [id number] Date of Birth/Sex: Treating RN: Dec 06, 1970 (51 y.o. M) Primary Care Provider: Lia [name] Other Clinician: Referring Provider: Treating Provider/Extender: [name] in Treatment: 1 Subjective Chief Complaint Information obtained from Patient 03/22/2021: Right foot ulcer History of Present Illness (HPI) ADMISSION 05/13/2019 This is a 51 year old man who has type 2 diabetes with peripheral neuropathy. He has a history of wounds on the plantar aspect of his left first and fourth toes. He has had these for several months. He was being seen in the wound care center at [facility] in [facility]. He apparently presented with swelling and an open wound at the tip of  the toe. An MRI showed osteomyelitis. He was given 6 to 8 weeks of oral doxycycline again all of this via the patient we have none of these records. Since then he has been putting Goldbond on the areas wearing regular shoes. He was seen by his primary physician on 4/9 and sent down here for our review. In the primary care note it says he has been recommended for hyperbaric oxygen. Past medical history includes type 2 diabetes with peripheral neuropathy, heart failure with reduced ejection fraction 30 to 35%, peripheral arterial disease, coronary artery disease, hyperlipidemia, hypertension, syncope and a prior history of a right great toe amputation also by Dr. Lynden Ang in [place] ABIs in our clinic were 0.91 on the left. Also notable that in 2019 he appears to have had arterial studies that are visible in our system. At that point the right ABI was 1.17, TBI of 0.85 with triphasic waveforms. On the left his ABI was 1.19 with a TBI of 0.73 again with triphasic waveform 5/3; we did receive some information from [facility] wound care. The patient was seen there on 1/29. At that point he had wounds on the right foot at the amputation site the fifth digit fourth digit I am presuming on the right and then an area on the left first digit although that is not specifically stated, he has had a previous amputation on the right however. It would appear him at that time most of his ulcers were on the right the left foot is stated to have a lot of scales and calluses but not a lot of open wounds. The only wounds we define when he came in here last time were on the left first and left fourth toe He also had a wound culture that showed heavy growth of staph aureus and a slight growth of Stenotrophomonas maltophilia. The doxycycline should cover the staph aureus I am not sure about the stenotrophomonas. In any case he completed 6 weeks of this. UNFORTUNATELY I still do not have a copy of the MRI. And the exact  justification for hyperbarics is therefore lacking. 5/10; the patient had osteomyelitis in the left fourth toe. He was treated for 6 weeks of doxycycline this wound is healed as is the left first toe. He was sent here for hyperbaric oxygen however at this point his wounds are healed and I think  a course of watchful waiting and observation is in order. If the left fourth toe reopens then it is likely he will need a more protracted and aggressive treatment approach Readmission: 05/06/20 upon evaluation today patient presents for readmission here in the clinic exam issues with his right foot on the medial aspect at the amputation site where his great toe was this is the first metatarsal area that is affected. He has been seeing Dr. Marcha Soldersathey who performed the surgery. With that being said currently the patient was diagnosed as having MRSA on a culture that was + March 31. Has been on IV vancomycin for 6 weeks he is now on doxycycline as the MRSA was sensitive to Doxy and he does have 2 more refills he has been on that for 3 weeks already. He has MRI of January for showed L knee first metatarsal base early osteomyelitis versus possible reactive disease but nonetheless based on what was seen it appears osteomyelitis is more likely the cause here. He has had Apligraf although to be honest that did not seem to do the job. He is also been using Dakin's solution and currently is using Aquacel. Sharp debridement has been performed by Dr. Marcha Soldersathey on a weekly basis according to what the patient tells me. With all that being said the patient was referred to us for further evaluation and treatment as unfortunately he does not seem to be responding well to his treatment with the surgeon. They have considered a total contact cast it sounds like but again with the infection that this was not a good idea. The patient does have a significant past medical history for diabetes mellitus type 2 for which she is on insulin,  hypertension, significant congestive heart failure with a most recent ejection fraction of 30 to 35% and that was in 2020. He has not seen his cardiologist Dr. Wyline MoodBranch since that time. In regard to his diabetes his A1c he does not even know he tells me his blood sugars run somewhere in the 200-300 range. With that being said he has not seen the primary care provider recently for management of this either. The patient tells me he is also a current smoker. He tells me he is trying to stop using nicotine pouches but he just does not have enough saliva he tells me his mouth is dry all the time this is probably a subsequent issue from the diabetes as well. Unfortunately he just appears to be in a place where he is not necessarily at his healthiest even with his medical conditions. I think he would be a candidate for hyperbaric oxygen therapy but there are a lot of other things that need to be in place first before we get to that point. 05/13/2020 patient presents for 1 week follow-up. He has been using Hydrofera Blue every other day without any issues. He had to reschedule his cardiologist appointment due to transportation Issues. He has no complaints today. 05/20/2020 upon evaluation today patient appears to be doing well with regard to his foot ulcer all things considered. With that being said he tells me that he has been tolerating the dressing changes without complication. There does not appear to be any signs of active infection which is great news and overall very pleased with where things stand today. No fevers, chills, nausea, vomiting, or diarrhea. 05/27/2020 upon evaluation today patient appears to be doing well with regard to his wound on the foot. He does not require little bit of debridement today but  overall he seems to be doing quite well. He actually has his echo on Monday we will see what that shows as well were hoping to be able to get him into the hyperbaric oxygen chamber but again a lot  depends on what this test shows he will also need a chest x-ray if this turns out okay before going in the chamber. 06/03/2020 upon evaluation today patient's wound actually showing signs of minimal improvement. Fortunately I think that he is continuing to make great progress all things considered. He does have his echocardiogram next week. That is good news so we can see whether or not hyperbaric oxygen therapy would be appropriate for him I am hopeful we will be able to get him in the chamber sooner rather than later but again it all depends on his ejection fraction. Right now we have been somewhat limited in being able to proceed with that as to be perfectly honest we just have not gotten the needed testing in order to go forward with the hyperbarics. If his ejection fraction is too low which could be the case then obviously is not good to be able to 5/25; patient presents for 1 week follow-up. He states he has his echo set up for next week. He overall feels well. He denies any issues over the past week. He denies signs of infection. 06/17/2020 upon evaluation today patient appears to be doing about the same in regard to his foot ulcer is measuring a little bit smaller today but still keeps developing the spongy tissue. I really think it may be a good idea for [place] to obtain a sample of a punch biopsy to send for evaluation. He is in agreement with this plan. Subsequently he did have his echocardiogram which showed that he has a systolic left ventricular function which is moderately decreased estimated to be 30 to 35%. He does have a follow-up appointment with the cardiologist on Monday to see what they think about this. I advised that he needs to talk to them as well that what they feel about him potentially going into hyperbaric oxygen therapy if his heart would be sufficient for this or not. Obviously if they feel that it is something that would be sufficient then we need to have this in writing.  Otherwise patient tells me that he has been doing fairly well is not really having any significant pain. 06/24/2020 upon evaluation today patient appears to be doing decently well in regard to his wound. In fact he does not have as much of the tissue that grew back this time. I do see that he had the results back from his skin biopsy. This showed reactive squamous hyperplasia. This again seems to be somewhat similar to lichen planus and how that would function. With that being said there was no malignancy and potentially this means it could be a result and a response to like a topical steroid like triamcinolone. I believe this may at least be something to get a shot at this point. The patient is in agreement with the plan. I am very pleased though there is no malignancy noted. We also got a letter back from his cardiologist stating that he is from a cardiac perspective able to participate in hyperbaric oxygen therapy. Therefore if need be I think we can proceed as such. For that reason I am going to send in for the chest x-ray in order to ensure that we have what we need in place if we do need  to proceed down that road. 07/01/2020 upon evaluation today patient appears to be doing decently well in regard to his wound. He has been tolerating the dressing changes without complication. Fortunately there does not appear to be any signs of active infection which is great news. No fever chills noted. He does note that he had a little bit of increased pain over the past week this seems to be kind of a neuropathy type issue to be honest. It does seem to be coming from his wound and I do not think it is coming from anywhere else in his foot although he does note some referred pain that seems to be traveling a little bit on him. Overall I think that in general this is just more of a bit of a mind game in that regard not that he is not hurting but rather where he is experiencing the pain at. Either way I do believe  that the wound is doing much better as compared to what it was previous. 07/08/2020 upon evaluation today patient's wound actually appears to be doing excellent. He is making great progress and the triamcinolone has done excellent for him. There does not appear to be any signs of active infection which is great news. No fevers, chills, nausea, vomiting, or diarrhea. 07/16/2018 upon evaluation today patient appears to be doing well currently in regard to his wound. In fact this is getting very close to complete resolution. I am extremely pleased with where we see things standing today. There does not appear to be any evidence of active infection which is great and overall happy in that regard as well. Fortunately the patient does not seem to be making any backslide as far as the wound is concerned and overall I really think that he is making excellent progress to be honest. No fevers, chills, nausea, vomiting, or diarrhea. 07/22/2020 upon evaluation today patient appears to be doing well with regard to his wound on initial inspection however he actually had some area of purulence noted up underneath the apparently healed area. This appears to have callused over and then subsequently started draining. This is going require quite a bit of debridement today unfortunately. This is a little bit of a setback but hopefully not too much. READMISSION 03/17/2021: The patient returns to our clinic today after a little over 6 months absence. He had used all of his transportation vouchers and was unable to come to [place]. He has been followed at [facility] by a general surgeon there. Reading the electronic medical record, it sounds like not a significant amount of progress was made and the last several notes discussed leaving the eschar in place and allowing it to accumulate. From what I am able to discern, the last actual debridement took place in December 2022. The last progress note indicates that the surgeon wanted  him to return here for ongoing management of a wound that has been quite challenging. ABI in clinic today was 0.62. I do not see any evidence of further vascular studies since 2019, at which time the only study performed was formal ankle- brachial and toe brachial indices. X-ray done in [place] on December 04, 2020: 1. 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 2. Prior first digit amputation. 3. Stable midfoot osteoarthritis. 4. No acute fracture or destructive bony lesion He continues to smoke, admitting to smoking 1 pack/day 03/22/2021: Initially, it appeared that the wound had nearly closed, but after some debridement of callus,  I opened up a pocket. This does not probe to bone, but the tissue appears quite pale. I was unable to debride this significantly. 03/24/2021: Here for first total contact cast change. Patient History Information obtained from Patient. Family History Unknown History, Cancer, Hypertension - Father, Lung Disease - Paternal Grandparents, No family history of Diabetes, Heart Disease, Hereditary Spherocytosis, Kidney Disease, Seizures, Stroke, Thyroid Problems, Tuberculosis. Social History Current every day smoker - 1 PPD, Marital Status - Separated, Alcohol Use - Rarely, Drug Use - No History, Caffeine Use - Daily - Coffee. Medical History Eyes Denies history of Cataracts, Glaucoma, Optic Neuritis Ear/Nose/Mouth/Throat Denies history of Chronic sinus problems/congestion, Middle ear problems Hematologic/Lymphatic Denies history of Anemia, Hemophilia, Human Immunodeficiency Virus, Lymphedema, Sickle Cell Disease Respiratory Patient has history of Sleep Apnea Denies history of Aspiration, Asthma, Chronic Obstructive Pulmonary Disease (COPD), Pneumothorax, Tuberculosis Cardiovascular Patient has history of Congestive Heart Failure, Hypertension Denies history of Angina, Arrhythmia, Coronary Artery Disease,  Deep Vein Thrombosis, Hypotension, Myocardial Infarction, Peripheral Arterial Disease, Peripheral Venous Disease, Phlebitis, Vasculitis Gastrointestinal Denies history of Cirrhosis , Colitis, Crohnoos, Hepatitis A, Hepatitis B, Hepatitis C Endocrine Patient has history of Type II Diabetes Denies history of Type I Diabetes Genitourinary Denies history of End Stage Renal Disease Immunological Denies history of Lupus Erythematosus, Raynaudoos, Scleroderma Integumentary (Skin) Denies history of History of Burn Musculoskeletal Denies history of Gout, Rheumatoid Arthritis, Osteoarthritis, Osteomyelitis Neurologic Patient has history of Neuropathy - bilateral legs Denies history of Dementia, Quadriplegia, Paraplegia, Seizure Disorder Oncologic Denies history of Received Chemotherapy, Received Radiation Psychiatric Denies history of Anorexia/bulimia, Confinement Anxiety Hospitalization/Surgery History - Appendectomy;Oral surgery;Right great toe amputation.. Medical A Surgical History Notes nd Eyes c/o right eye feeling dry Objective Constitutional No acute distress. Vitals Time Taken: 1:45 PM, Height: 72 in, Weight: 290 lbs, BMI: 39.3, Temperature: 98.3 F, Pulse: 79 bpm, Respiratory Rate: 20 breaths/min, Blood Pressure: 125/76 mmHg. Respiratory Normal work of breathing on room air. General Notes: 03/24/2021: No significant change from 2 days ago. Integumentary (Hair, Skin) Wound #4 status is Open. Original cause of wound was Trauma. The date acquired was: 03/18/2019. The wound has been in treatment 1 weeks. The wound is located on the Right Metatarsal head first. The wound measures 1cm length x 0.9cm width x 0.6cm depth; 0.707cm^2 area and 0.424cm^3 volume. There is Fat Layer (Subcutaneous Tissue) exposed. There is no tunneling or undermining noted. There is a medium amount of serosanguineous drainage noted. The wound margin is thickened. There is large (67-100%) red granulation within  the wound bed. There is no necrotic tissue within the wound bed. 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 There was a T Contact otal Cast Procedure by Duanne Guess, MD. Post procedure Diagnosis Wound #4: Same as Pre-Procedure Plan 03/24/2021: First cast change. He needed a little bit of additional padding across his tibial surface. Follow-up in 1 week. Electronic Signature(s) Signed: 03/24/2021 2:37:03 PM By: Duanne Guess MD FACS Entered By: Duanne Guess on 03/24/2021 14:37:03 -------------------------------------------------------------------------------- HxROS Details Patient Name: Date of Service: [name], [name] [facility] 03/24/2021 2:00 PM Medical Record Number: [id number] Patient Account Number: [id number] Date of Birth/Sex: Treating RN: 1970-05-19 (51 y.o. M) Primary Care Provider: Lia [name] Other Clinician: Referring Provider: Treating Provider/Extender: [name] in  Treatment: 1 Information Obtained From Patient Eyes Medical History: Negative for: Cataracts; Glaucoma; Optic Neuritis Past Medical History Notes: c/o right eye feeling dry Ear/Nose/Mouth/Throat Medical History: Negative for: Chronic sinus problems/congestion; Middle ear problems Hematologic/Lymphatic Medical History: Negative for: Anemia; Hemophilia; Human Immunodeficiency Virus; Lymphedema; Sickle Cell Disease Respiratory Medical History: Positive for: Sleep Apnea Negative for: Aspiration; Asthma; Chronic Obstructive Pulmonary Disease (COPD); Pneumothorax; Tuberculosis Cardiovascular Medical History: Positive for: Congestive Heart Failure; Hypertension Negative for: Angina; Arrhythmia;  Coronary Artery Disease; Deep Vein Thrombosis; Hypotension; Myocardial Infarction; Peripheral Arterial Disease; Peripheral Venous Disease; Phlebitis; Vasculitis Gastrointestinal Medical History: Negative for: Cirrhosis ; Colitis; Crohns; Hepatitis A; Hepatitis B; Hepatitis C Endocrine Medical History: Positive for: Type II Diabetes Negative for: Type I Diabetes Time with diabetes: 14 years Treated with: Insulin, Oral agents Blood sugar tested every day: No Blood sugar testing results: Breakfast: 137; Bedtime: 93 Genitourinary Medical History: Negative for: End Stage Renal Disease Immunological Medical History: Negative for: Lupus Erythematosus; Raynauds; Scleroderma Integumentary (Skin) Medical History: Negative for: History of Burn Musculoskeletal Medical History: Negative for: Gout; Rheumatoid Arthritis; Osteoarthritis; Osteomyelitis Neurologic Medical History: Positive for: Neuropathy - bilateral legs Negative for: Dementia; Quadriplegia; Paraplegia; Seizure Disorder Oncologic Medical History: Negative for: Received Chemotherapy; Received Radiation Psychiatric Medical History: Negative for: Anorexia/bulimia; Confinement Anxiety Immunizations Pneumococcal Vaccine: Received Pneumococcal Vaccination: No Implantable Devices None Hospitalization / Surgery History Type of Hospitalization/Surgery Appendectomy;Oral surgery;Right great toe amputation. Family and Social History Unknown History: Yes; Cancer: Yes; Diabetes: No; Heart Disease: No; Hereditary Spherocytosis: No; Hypertension: Yes - Father; Kidney Disease: No; Lung Disease: Yes - Paternal Grandparents; Seizures: No; Stroke: No; Thyroid Problems: No; Tuberculosis: No; Current every day smoker - 1 PPD; Marital Status - Separated; Alcohol Use: Rarely; Drug Use: No History; Caffeine Use: Daily - Coffee; Financial Concerns: No; Food, Clothing or Shelter Needs: No; Support System Lacking: No; Transportation Concerns:  No Physician Affirmation I have reviewed and agree with the above information. Electronic Signature(s) Signed: 03/24/2021 2:39:20 PM By: Duanne Guess MD FACS Entered By: Duanne Guess on 03/24/2021 14:35:34 -------------------------------------------------------------------------------- Total Contact Cast Details Patient Name: Date of Service: [name], [name] [facility] 03/24/2021 2:00 PM Medical Record Number: [id number] Patient Account Number: [id number] Date of Birth/Sex: Treating RN: [birth year] (51 y.o. Elizebeth Koller Primary Care Provider: Lia [name] Other Clinician: Referring Provider: Treating Provider/Extender: [name] in Treatment: 1 T Contact Cast Applied for Wound Assessment: otal Wound #4 Right Metatarsal head first Performed By: Physician Duanne Guess, MD Post Procedure Diagnosis Same as Pre-procedure Electronic Signature(s) Signed: 03/24/2021 2:39:20 PM By: Duanne Guess MD FACS Signed: 03/24/2021 6:27:47 PM By: Zandra Abts RN, BSN Entered By: Zandra Abts on 03/24/2021 14:13:12 -------------------------------------------------------------------------------- SuperBill Details Patient Name: Date of Service: [name], [name] [facility] 03/24/2021 Medical Record Number: [id number] Patient Account Number: [id number] Date of Birth/Sex: Treating RN: December 03, 1970 (50 y.o. 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 Facility Procedures CPT4 Code: [id number] Description: [phone] - APPLY TOTAL CONTACT LEG CAST ICD-10 Diagnosis Description E11.42 Type 2 diabetes mellitus with diabetic polyneuropathy L97.519 Non-pressure  chronic ulcer of other part of right foot with unspecified severit Modifier: y Quantity: 1 Physician Procedures : CPT4 Code Description Modifier [id number] [phone] - WC PHYS APPLY TOTAL CONTACT CAST ICD-10 Diagnosis Description E11.42  Type 2 diabetes mellitus with diabetic polyneuropathy L97.519 Non-pressure chronic ulcer of other part of right foot with unspecified  severity Quantity: 1 Electronic Signature(s) Signed: 03/24/2021 2:37:30 PM By: Duanne Guess MD FACS Entered By: Duanne Guess on 03/24/2021 14:37:30

## 2021-03-25 NOTE — Progress Notes (Signed)
ARGELIO, DIJULIO ([id number]) Visit Report for 03/24/2021 Arrival Information Details Patient Name: Date of Service: [name], RA NDO [facility] 03/24/2021 2:00 PM Medical Record Number: [id number] Patient Account Number: [id number] Date of Birth/Sex: Treating RN: Apr 19, 1970 (51 y.o. M) Primary Care Marlen Koman: Stoney Bang Other Clinician: Referring Promiss Labarbera: Treating Jaymison Luber/Extender: Yates Decamp in Treatment: 1 Visit Information History Since Last Visit Added or deleted any medications: No Patient Arrived: Ambulatory Any new allergies or adverse reactions: No Arrival Time: 13:45 Had a fall or experienced change in No Accompanied By: self activities of daily living that may affect Transfer Assistance: None risk of falls: Patient Identification Verified: Yes Signs or symptoms of abuse/neglect since last visito No Secondary Verification Process Completed: Yes Hospitalized since last visit: No Patient Requires Transmission-Based Precautions: No Implantable device outside of the clinic excluding No Patient Has Alerts: No cellular tissue based products placed in the center since last visit: Has Dressing in Place as Prescribed: Yes Pain Present Now: No Electronic Signature(s) Signed: 03/25/2021 8:29:09 AM By: Sandre Kitty Entered By: Sandre Kitty on 03/24/2021 13:45:43 -------------------------------------------------------------------------------- Encounter Discharge Information Details Patient Name: Date of Service: [name], RA NDO [facility] 03/24/2021 2:00 PM Medical Record Number: [id number] Patient Account Number: [id number] Date of Birth/Sex: Treating RN: March 10, 1970 (51 y.o. Janyth Contes Primary Care Shealyn Sean: Stoney Bang Other Clinician: Referring Shavonta Gossen: Treating Dolphus Linch/Extender: Yates Decamp in Treatment: 1 Encounter Discharge Information Items Discharge Condition: Stable Ambulatory Status:  Cane Discharge Destination: Home Transportation: Private Auto Accompanied By: alone Schedule Follow-up Appointment: Yes Clinical Summary of Care: Patient Declined Electronic Signature(s) Signed: 03/24/2021 6:27:47 PM By: Levan Hurst RN, BSN Entered By: Levan Hurst on 03/24/2021 17:12:52 -------------------------------------------------------------------------------- Multi-Disciplinary Care Plan Details Patient Name: Date of Service: [name], RA NDO [facility] 03/24/2021 2:00 PM Medical Record Number: [id number] Patient Account Number: [id number] Date of Birth/Sex: Treating RN: 05-17-1970 (51 y.o. Janyth Contes Primary Care Deiondra Denley: Stoney Bang Other Clinician: Referring Korra Christine: Treating Kaliya Shreiner/Extender: Yates Decamp in Treatment: 1 Active Inactive Wound/Skin Impairment Nursing Diagnoses: Impaired tissue integrity Knowledge deficit related to smoking impact on wound healing Knowledge deficit related to ulceration/compromised skin integrity Goals: Patient will demonstrate a reduced rate of smoking or cessation of smoking Date Initiated: 03/17/2021 Target Resolution Date: 04/23/2021 Goal Status: Active Patient/caregiver will verbalize understanding of skin care regimen Date Initiated: 03/22/2021 Target Resolution Date: 04/23/2021 Goal Status: Active Interventions: Assess patient/caregiver ability to perform ulcer/skin care regimen upon admission and as needed Assess ulceration(s) every visit Provide education on smoking Provide education on ulcer and skin care Notes: Electronic Signature(s) Signed: 03/24/2021 6:27:47 PM By: Levan Hurst RN, BSN Entered By: Levan Hurst on 03/24/2021 17:10:53 -------------------------------------------------------------------------------- Pain Assessment Details Patient Name: Date of Service: [name], RA NDO [facility] 03/24/2021 2:00 PM Medical Record Number: [id number] Patient Account Number: [id number] Date  of Birth/Sex: Treating RN: [birth year] (51 y.o. M) Primary Care Mikell Kazlauskas: Stoney Bang Other Clinician: Referring Veryl Winemiller: Treating Brexley Cutshaw/Extender: Yates Decamp in Treatment: 1 Active Problems Location of Pain Severity and Description of Pain Patient Has Paino No Site Locations Pain Management and Medication Current Pain Management: Electronic Signature(s) Signed: 03/25/2021 8:29:09 AM By: Sandre Kitty Entered By: Sandre Kitty on 03/24/2021 13:47:08 -------------------------------------------------------------------------------- Patient/Caregiver Education Details Patient Name: Date of Service: [name], [name] 3/8/2023andnbsp2:00 PM Medical Record Number: [id number] Patient Account Number: [id number] Date of Birth/Gender: Treating RN: 18-Jan-1970 (51 y.o. Janyth Contes Primary Care Physician: Stoney Bang Other  Clinician: Referring Physician: Treating Physician/Extender: Yates Decamp in Treatment: 1 Education Assessment Education Provided To: Patient Education Topics Provided Wound/Skin Impairment: Methods: Explain/Verbal Responses: State content correctly Electronic Signature(s) Signed: 03/24/2021 6:27:47 PM By: Levan Hurst RN, BSN Entered By: Levan Hurst on 03/24/2021 17:11:03 -------------------------------------------------------------------------------- Wound Assessment Details Patient Name: Date of Service: [name], RA NDO [facility] 03/24/2021 2:00 PM Medical Record Number: [id number] Patient Account Number: [id number] Date of Birth/Sex: Treating RN: Sep 01, 1970 (51 y.o. M) Primary Care Nayel Purdy: Stoney Bang Other Clinician: Referring Leibish Mcgregor: Treating Carynn Felling/Extender: Yates Decamp in Treatment: 1 Wound Status Wound Number: 4 Primary Diabetic Wound/Ulcer of the Lower Extremity Etiology: Wound Location: Right Metatarsal head first Wound Open Wounding  Event: Trauma Status: Date Acquired: 03/18/2019 Comorbid Sleep Apnea, Congestive Heart Failure, Hypertension, Type II Weeks Of Treatment: 1 History: Diabetes, Neuropathy Clustered Wound: No Wound Measurements Length: (cm) 1 Width: (cm) 0.9 Depth: (cm) 0.6 Area: (cm) 0.707 Volume: (cm) 0.424 % Reduction in Area: 91.5% % Reduction in Volume: 48.9% Epithelialization: Medium (34-66%) Tunneling: No Undermining: No Wound Description Classification: Grade 2 Wound Margin: Thickened Exudate Amount: Medium Exudate Type: Serosanguineous Exudate Color: red, brown Foul Odor After Cleansing: No Slough/Fibrino Yes Wound Bed Granulation Amount: Large (67-100%) Exposed Structure Granulation Quality: Red Fascia Exposed: No Necrotic Amount: None Present (0%) Fat Layer (Subcutaneous Tissue) Exposed: Yes Tendon Exposed: No Muscle Exposed: No Joint Exposed: No Bone Exposed: No Treatment Notes Wound #4 (Metatarsal head first) Wound Laterality: Right Cleanser Soap and Water Discharge Instruction: May shower and wash wound with dial antibacterial soap and water prior to dressing change. Wound Cleanser Discharge Instruction: Cleanse the wound with wound cleanser prior to applying a clean dressing using gauze sponges, not tissue or cotton balls. Peri-Wound Care Topical Primary Dressing Hydrofera Blue Classic Foam, 2x2 in Discharge Instruction: Moisten with saline prior to applying to wound bed Secondary Dressing Woven Gauze Sponge, Non-Sterile 4x4 in Discharge Instruction: Apply over primary dressing as directed. Drawtex 4x4 in Discharge Instruction: Apply over primary dressing as directed. Secured With 27M Medipore H Soft Cloth Surgical T ape, 4 x 10 (in/yd) Discharge Instruction: Secure with tape as directed. Compression Wrap Compression Stockings Add-Ons Electronic Signature(s) Signed: 03/24/2021 6:27:47 PM By: Levan Hurst RN, BSN Entered By: Levan Hurst on 03/24/2021  14:08:15 -------------------------------------------------------------------------------- Vitals Details Patient Name: Date of Service: [name], RA NDO [facility] 03/24/2021 2:00 PM Medical Record Number: [id number] Patient Account Number: [id number] Date of Birth/Sex: Treating RN: January 25, 1970 (51 y.o. M) Primary Care Alazar Cherian: Stoney Bang Other Clinician: Referring Zarrah Loveland: Treating Jerrett Baldinger/Extender: Yates Decamp in Treatment: 1 Vital Signs Time Taken: 13:45 Temperature (F): 98.3 Height (in): 72 Pulse (bpm): 79 Weight (lbs): 290 Respiratory Rate (breaths/min): 20 Body Mass Index (BMI): 39.3 Blood Pressure (mmHg): 125/76 Reference Range: 80 - 120 mg / dl Electronic Signature(s) Signed: 03/25/2021 8:29:09 AM By: Sandre Kitty Entered By: Sandre Kitty on 03/24/2021 13:47:00

## 2021-03-26 DIAGNOSIS — E11621 Type 2 diabetes mellitus with foot ulcer: Secondary | ICD-10-CM | POA: Diagnosis not present

## 2021-03-26 DIAGNOSIS — B9562 Methicillin resistant Staphylococcus aureus infection as the cause of diseases classified elsewhere: Secondary | ICD-10-CM | POA: Diagnosis not present

## 2021-03-26 DIAGNOSIS — E1140 Type 2 diabetes mellitus with diabetic neuropathy, unspecified: Secondary | ICD-10-CM | POA: Diagnosis not present

## 2021-03-26 DIAGNOSIS — E1169 Type 2 diabetes mellitus with other specified complication: Secondary | ICD-10-CM | POA: Diagnosis not present

## 2021-03-26 DIAGNOSIS — L97412 Non-pressure chronic ulcer of right heel and midfoot with fat layer exposed: Secondary | ICD-10-CM | POA: Diagnosis not present

## 2021-03-26 DIAGNOSIS — M869 Osteomyelitis, unspecified: Secondary | ICD-10-CM | POA: Diagnosis not present

## 2021-03-31 ENCOUNTER — Encounter (HOSPITAL_BASED_OUTPATIENT_CLINIC_OR_DEPARTMENT_OTHER): Payer: Medicare Other | Admitting: General Surgery

## 2021-03-31 ENCOUNTER — Other Ambulatory Visit: Payer: Self-pay

## 2021-03-31 DIAGNOSIS — L97512 Non-pressure chronic ulcer of other part of right foot with fat layer exposed: Secondary | ICD-10-CM | POA: Diagnosis not present

## 2021-03-31 DIAGNOSIS — G4730 Sleep apnea, unspecified: Secondary | ICD-10-CM | POA: Diagnosis not present

## 2021-03-31 DIAGNOSIS — I110 Hypertensive heart disease with heart failure: Secondary | ICD-10-CM | POA: Diagnosis not present

## 2021-03-31 DIAGNOSIS — E1142 Type 2 diabetes mellitus with diabetic polyneuropathy: Secondary | ICD-10-CM | POA: Diagnosis not present

## 2021-03-31 DIAGNOSIS — F17210 Nicotine dependence, cigarettes, uncomplicated: Secondary | ICD-10-CM | POA: Diagnosis not present

## 2021-03-31 DIAGNOSIS — I5022 Chronic systolic (congestive) heart failure: Secondary | ICD-10-CM | POA: Diagnosis not present

## 2021-03-31 DIAGNOSIS — E11621 Type 2 diabetes mellitus with foot ulcer: Secondary | ICD-10-CM | POA: Diagnosis not present

## 2021-03-31 DIAGNOSIS — L97519 Non-pressure chronic ulcer of other part of right foot with unspecified severity: Secondary | ICD-10-CM | POA: Diagnosis not present

## 2021-03-31 DIAGNOSIS — I2510 Atherosclerotic heart disease of native coronary artery without angina pectoris: Secondary | ICD-10-CM | POA: Diagnosis not present

## 2021-03-31 DIAGNOSIS — E1151 Type 2 diabetes mellitus with diabetic peripheral angiopathy without gangrene: Secondary | ICD-10-CM | POA: Diagnosis not present

## 2021-04-02 NOTE — Progress Notes (Signed)
[name], [name] ([id number]) ?Visit Report for 03/31/2021 ?Chief Complaint Document Details ?Patient Name: Date of Service: ?[name], [name] 03/31/2021 10:30 A M ?Medical Record Number: [id number] ?Patient Account Number: [id number] ?Date of Birth/Sex: Treating RN: ?[birth year] (51 y.o. M) ?Primary Care Provider: [name] Other Clinician: ?Referring Provider: ?Treating Provider/Extender: Duanne Guess ?[name] ?Weeks in Treatment: 2 ?Information Obtained from: Patient ?Chief Complaint ?03/22/2021: Right foot ulcer ?Electronic Signature(s) ?Signed: 03/31/2021 12:52:10 PM By: Duanne Guess MD FACS ?Entered By: Duanne Guess on 03/31/2021 12:52:10 ?-------------------------------------------------------------------------------- ?Debridement Details ?Patient Name: Date of Service: ?[name], [name] 03/31/2021 10:30 A M ?Medical Record Number: [id number] ?Patient Account Number: [id number] ?Date of Birth/Sex: Treating RN: ?1970/08/16 (51 y.o. Jose Merl, [name] ?Primary Care Provider: [name] Other Clinician: ?Referring Provider: ?Treating Provider/Extender: Duanne Guess ?[name] ?Weeks in Treatment: 2 ?Debridement Performed for Assessment: Wound #4 Right Metatarsal head first ?Performed By: Physician Duanne Guess, MD ?Debridement Type: Debridement ?Severity of Tissue Pre Debridement: Fat layer exposed ?Level of Consciousness (Pre-procedure): Awake and Alert ?Pre-procedure Verification/Time Out Yes - 11:15 ?Taken: ?Start Time: 11:15 ?Pain Control: Lidocaine ?T Area Debrided (L x W): ?otal 1 (cm) x 0.6 (cm) = 0.6 (cm?) ?Tissue and other material debrided: ?Viable, Non-Viable, Callus, Skin: Dermis , Skin: Epidermis ?Level: Skin/Epidermis ?Debridement Description: Selective/Open Wound ?Instrument: Curette ?Bleeding: Minimum ?Hemostasis Achieved: Pressure ?End Time: 11:15 ?Procedural Pain: 0 ?Post Procedural Pain: 0 ?Response to Treatment: Procedure was tolerated  well ?Level of Consciousness (Post- Awake and Alert ?procedure): ?Post Debridement Measurements of Total Wound ?Length: (cm) 1 ?Width: (cm) 0.6 ?Depth: (cm) 0.5 ?Volume: (cm?) 0.236 ?Character of Wound/Ulcer Post Debridement: Improved ?Severity of Tissue Post Debridement: Fat layer exposed ?Post Procedure Diagnosis ?Same as Pre-procedure ?Electronic Signature(s) ?Signed: 03/31/2021 6:42:44 PM By: Duanne Guess MD FACS ?Signed: 04/02/2021 12:57:05 PM By: Fonnie Mu RN ?Entered By: Fonnie Mu on 03/31/2021 11:18:52 ?-------------------------------------------------------------------------------- ?HPI Details ?Patient Name: Date of Service: ?[name], [name] 03/31/2021 10:30 A M ?Medical Record Number: [id number] ?Patient Account Number: [id number] ?Date of Birth/Sex: Treating RN: ?February 04, 1970 (51 y.o. M) ?Primary Care Provider: [name] Other Clinician: ?Referring Provider: ?Treating Provider/Extender: Duanne Guess ?[name] ?Weeks in Treatment: 2 ?History of Present Illness ?HPI Description: ADMISSION ?05/13/2019 ?This is a 51 year old man who has type 2 diabetes with peripheral neuropathy. He has a history of wounds on the plantar aspect of his left first and fourth ?toes. He has had these for several months. He was being seen in the wound care center at [facility] in [facility]. He apparently presented ?with swelling and an open wound at the tip of the toe. An MRI showed osteomyelitis. He was given 6 to 8 weeks of oral doxycycline again all of this via the ?patient we have none of these records. Since then he has been putting [place] on the areas wearing regular shoes. He was seen by his primary physician on ?4/9 and sent down here for our review. In the primary care note it says he has been recommended for hyperbaric oxygen. ?Past medical history includes type 2 diabetes with peripheral neuropathy, heart failure with reduced ejection fraction 30 to 35%, peripheral  arterial disease, ?coronary artery disease, hyperlipidemia, hypertension, syncope and a prior history of a right great toe amputation also by Dr. Lynden Ang in [place] ?ABIs in our clinic were 0.91 on the left. Also notable that in 2019 he appears to have had arterial studies that are visible in our system. At that point  the right ?ABI was 1.17, TBI of 0.85 with triphasic waveforms. On the left his ABI was 1.19 with a TBI of 0.73 again with triphasic waveform ?5/3; [facility] The patient was seen there on 1/29. At that point he had wounds on the right foot at ?the amputation site the fifth digit fourth digit I am presuming on the right and then an area on the left first digit although that is not specifically stated, he has ?had a previous amputation on the right however. It would appear him at that time most of his ulcers were on the right the left foot is stated to have a lot of ?scales and calluses but not a lot of open wounds. The only wounds we define when he came in here last time were on the left first and left fourth toe ?He also had a wound culture that showed heavy growth of staph aureus and a slight growth of Stenotrophomonas maltophilia. The doxycycline should cover ?the staph aureus I am not sure about the stenotrophomonas. In any case he completed 6 weeks of this. ?UNFORTUNATELY I still do not have a copy of the MRI. And the exact justification for hyperbarics is therefore lacking. ?5/10; the patient had osteomyelitis in the left fourth toe. He was treated for 6 weeks of doxycycline this wound is healed as is the left first toe. He was sent ?here for hyperbaric oxygen however at this point his wounds are healed and I think a course of watchful waiting and observation is in order. If the left fourth ?toe reopens then it is likely he will need a more protracted and aggressive treatment approach ?Readmission: ?05/06/20 upon evaluation today patient presents for  readmission here in the clinic exam issues with his right foot on the medial aspect at the amputation site ?where his great toe was this is the first metatarsal area that is affected. He has been seeing Dr. Marcha Soldersathey who performed the surgery. With that being said ?currently the patient was diagnosed as having MRSA on a culture that was + March 31. Has been on IV vancomycin for 6 weeks he is now on doxycycline as ?the MRSA was sensitive to Doxy and he does have 2 more refills he has been on that for 3 weeks already. He has MRI of January for showed L knee first ?metatarsal base early osteomyelitis versus possible reactive disease but nonetheless based on what was seen it appears osteomyelitis is more likely the ?cause here. He has had Apligraf although to be honest that did not seem to do the job. He is also been using Dakin's solution and currently is using Aquacel. ?Sharp debridement has been performed by Dr. Marcha Soldersathey on a weekly basis according to what the patient tells me. With all that being said the patient was ?referred to us for further evaluation and treatment as unfortunately he does not seem to be responding well to his treatment with the surgeon. They have ?considered a total contact cast it sounds like but again with the infection that this was not a good idea. ?The patient does have a significant past medical history for diabetes mellitus type 2 for which she is on insulin, hypertension, significant congestive heart ?failure with a most recent ejection fraction of 30 to 35% and that was in 2020. He has not seen his cardiologist Dr. Wyline MoodBranch since that time. In regard to his ?diabetes his A1c he does not even know he tells me his  blood sugars run somewhere in the 200-300 range. With that being said he has not seen the primary ?care provider recently for management of this either. The patient tells me he is also a current smoker. He tells me he is trying to stop using nicotine pouches ?but he just does not have  enough saliva he tells me his mouth is dry all the time this is probably a subsequent issue from the diabetes as well. Unfortunately ?he just appears to be in a place where he is not necessarily at his health

## 2021-04-02 NOTE — Progress Notes (Signed)
[name], [name] ([id number]) ?Visit Report for 03/31/2021 ?Arrival Information Details ?Patient Name: Date of Service: ?[name], [name] 03/31/2021 10:30 A M ?Medical Record Number: [id number] ?Patient Account Number: [id number] ?Date of Birth/Sex: Treating RN: ?1970/08/29 (51 y.o. [name], [name] ?Primary Care Leandria Thier: [name] Other Clinician: ?Referring Shantal Roan: ?Treating Levis Nazir/Extender: Duanne Guess ?[name] ?Weeks in Treatment: 2 ?Visit Information History Since Last Visit ?Added or deleted any medications: No ?Patient Arrived: Gilmer Mor ?Any new allergies or adverse reactions: No ?Arrival Time: 10:44 ?Had a fall or experienced change in No ?Accompanied By: self ?activities of daily living that may affect ?Transfer Assistance: None ?risk of falls: ?Patient Identification Verified: Yes ?Signs or symptoms of abuse/neglect since last visito No ?Secondary Verification Process Completed: Yes ?Hospitalized since last visit: No ?Patient Requires Transmission-Based Precautions: No ?Implantable device outside of the clinic excluding No ?Patient Has Alerts: No ?cellular tissue based products placed in the center ?since last visit: ?Has Dressing in Place as Prescribed: Yes ?Has Footwear/Offloading in Place as Prescribed: Yes ?Right: T Contact Cast ?otal ?Pain Present Now: No ?Electronic Signature(s) ?Signed: 04/02/2021 12:57:05 PM By: Fonnie Mu RN ?Entered By: Fonnie Mu on 03/31/2021 10:45:23 ?-------------------------------------------------------------------------------- ?Encounter Discharge Information Details ?Patient Name: Date of Service: ?[name], [name] 03/31/2021 10:30 A M ?Medical Record Number: [id number] ?Patient Account Number: [id number] ?Date of Birth/Sex: Treating RN: ?1970-07-03 (51 y.o. [name], [name] ?Primary Care Morrie Daywalt: [name] Other Clinician: ?Referring Jaquavis Felmlee: ?Treating Jaxtyn Linville/Extender: Duanne Guess ?[name] ?Weeks in  Treatment: 2 ?Encounter Discharge Information Items Post Procedure Vitals ?Discharge Condition: Stable ?Temperature (F): 98.7 ?Ambulatory Status: Ambulatory ?Pulse (bpm): 74 ?Discharge Destination: Home ?Respiratory Rate (breaths/min): 17 ?Transportation: Private Auto ?Blood Pressure (mmHg): 147/74 ?Accompanied By: self ?Schedule Follow-up Appointment: Yes ?Clinical Summary of Care: Patient Declined ?Electronic Signature(s) ?Signed: 04/02/2021 12:57:05 PM By: Fonnie Mu RN ?Entered By: Fonnie Mu on 03/31/2021 12:17:58 ?-------------------------------------------------------------------------------- ?Lower Extremity Assessment Details ?Patient Name: ?Date of Service: ?[name], [name] 03/31/2021 10:30 A M ?Medical Record Number: [id number] ?Patient Account Number: [id number] ?Date of Birth/Sex: ?Treating RN: ?1970-06-10 (51 y.o. [name], [name] ?Primary Care Glade Strausser: [name] ?Other Clinician: ?Referring Brizza Nathanson: ?Treating Philippe Gang/Extender: Duanne Guess ?[name] ?Weeks in Treatment: 2 ?Edema Assessment ?Assessed: [Left: No] [Right: Yes] ?E[Left: dema] [Right: :] ?Calf ?Left: Right: ?Point of Measurement: 34 cm From Medial Instep 43.5 cm ?Ankle ?Left: Right: ?Point of Measurement: 11 cm From Medial Instep 24.5 cm ?Vascular Assessment ?Pulses: ?Dorsalis Pedis ?Palpable: [Right:Yes] ?Electronic Signature(s) ?Signed: 04/02/2021 12:57:05 PM By: Fonnie Mu RN ?Entered By: Fonnie Mu on 03/31/2021 10:54:30 ?-------------------------------------------------------------------------------- ?Multi Wound Chart Details ?Patient Name: ?Date of Service: ?[name], [name] 03/31/2021 10:30 A M ?Medical Record Number: [id number] ?Patient Account Number: [id number] ?Date of Birth/Sex: ?Treating RN: ?Nov 27, 1970 (51 y.o. M) ?Primary Care Aidyn Sportsman: [name] ?Other Clinician: ?Referring Luan Maberry: ?Treating Amaan Meyer/Extender: Duanne Guess ?[name] ?Weeks in  Treatment: 2 ?Vital Signs ?Height(in): 72 ?Capillary Blood Glucose(mg/dl): 614 ?Weight(lbs): 290 ?Pulse(bpm): ?Body Mass Index(BMI): 39.3 ?Blood Pressure(mmHg): ?Temperature(??F): 98.4 ?Respiratory Rate(breaths/min): 17 ?Photos: [4:Right Metatarsal head first] [N/A:N/A N/A] ?Wound Location: [4:Trauma] [N/A:N/A] ?Wounding Event: [4:Diabetic Wound/Ulcer of the Lower] [N/A:N/A] ?Primary Etiology: [4:Extremity Sleep Apnea, Congestive Heart] [N/A:N/A] ?Comorbid History: [4:Failure, Hypertension, Type II Diabetes, Neuropathy 03/18/2019] [N/A:N/A] ?Date Acquired: [4:2] [N/A:N/A] ?Weeks of Treatment: [4:Open] [N/A:N/A] ?Wound Status: [4:No] [N/A:N/A] ?Wound Recurrence: [4:1x0.6x0.5] [N/A:N/A] ?Measurements L x W x D (cm) [4:0.471] [N/A:N/A] ?A (cm?) : ?rea [4:0.236] [N/A:N/A] ?Volume (cm?) : [4:94.30%] [N/A:N/A] ?% Reduction  in A [4:rea: 71.50%] [N/A:N/A] ?% Reduction in Volume: [4:Grade 2] [N/A:N/A] ?Classification: [4:None Present] [N/A:N/A] ?Exudate A mount: [4:Thickened] [N/A:N/A] ?Wound Margin: [4:None Present (0%)] [N/A:N/A] ?Granulation A mount: [4:None Present (0%)] [N/A:N/A] ?Necrotic A mount: ?[4:Fat Layer (Subcutaneous Tissue): Yes N/A] ?Exposed Structures: ?[4:Fascia: No Tendon: No Muscle: No Joint: No Bone: No None] [N/A:N/A] ?Epithelialization: [4:Debridement - Selective/Open Wound N/A] ?Debridement: ?Pre-procedure Verification/Time Out 11:15 [N/A:N/A] ?Taken: [4:Lidocaine] [N/A:N/A] ?Pain Control: [4:Callus] [N/A:N/A] ?Tissue Debrided: [4:Skin/Epidermis] [N/A:N/A] ?Level: [4:0.6] [N/A:N/A] ?Debridement A (sq cm): [4:rea Curette] [N/A:N/A] ?Instrument: [4:Minimum] [N/A:N/A] ?Bleeding: [4:Pressure] [N/A:N/A] ?Hemostasis A chieved: [4:0] [N/A:N/A] ?Procedural Pain: [4:0] [N/A:N/A] ?Post Procedural Pain: [4:Procedure was tolerated well] [N/A:N/A] ?Debridement Treatment Response: [4:1x0.6x0.5] [N/A:N/A] ?Post Debridement Measurements L x ?W x D (cm) [4:0.236] [N/A:N/A] ?Post Debridement Volume: (cm?) [4:callous  wound.] [N/A:N/A] ?Assessment Notes: [4:Debridement] [N/A:N/A] ?Procedures Performed: [4:T Contact Cast otal] ?Treatment Notes ?Wound #4 (Metatarsal head first) Wound Laterality: Right ?Cleanser ?Soap and Water ?Discharge Instruction: May shower and wash wound with dial antibacterial soap and water prior to dressing change. ?Wound Cleanser ?Discharge Instruction: Cleanse the wound with wound cleanser prior to applying a clean dressing using gauze sponges, not tissue or cotton balls. ?Peri-Wound Care ?Topical ?Primary Dressing ?Hydrofera Blue Classic Foam, 2x2 in ?Discharge Instruction: Moisten with saline prior to applying to wound bed ?Secondary Dressing ?Woven Gauze Sponge, Non-Sterile 4x4 in ?Discharge Instruction: Apply over primary dressing as directed. ?Drawtex 4x4 in ?Discharge Instruction: Apply over primary dressing as directed. ?Secured With ?28M Medipore H Soft Cloth Surgical T ape, 4 x 10 (in/yd) ?Discharge Instruction: Secure with tape as directed. ?Compression Wrap ?Compression Stockings ?Add-Ons ?Electronic Signature(s) ?Signed: 03/31/2021 12:52:01 PM By: Duanne Guess MD FACS ?Entered By: Duanne Guess on 03/31/2021 12:52:01 ?-------------------------------------------------------------------------------- ?Multi-Disciplinary Care Plan Details ?Patient Name: ?Date of Service: ?[name], [name] 03/31/2021 10:30 A M ?Medical Record Number: [id number] ?Patient Account Number: [id number] ?Date of Birth/Sex: ?Treating RN: ?February 05, 1970 (51 y.o. [name], [name] ?Primary Care Payne Garske: [name] ?Other Clinician: ?Referring Shatoria Stooksbury: ?Treating Baylen Dea/Extender: Duanne Guess ?[name] ?Weeks in Treatment: 2 ?Active Inactive ?Wound/Skin Impairment ?Nursing Diagnoses: ?Impaired tissue integrity ?Knowledge deficit related to smoking impact on wound healing ?Knowledge deficit related to ulceration/compromised skin integrity ?Goals: ?Patient will demonstrate a reduced rate of smoking or  cessation of smoking ?Date Initiated: 03/17/2021 ?Target Resolution Date: 04/23/2021 ?Goal Status: Active ?Patient/caregiver will verbalize understanding of skin care regimen ?Date Initiated: 03/22/2021 ?Target Resolution

## 2021-04-07 ENCOUNTER — Other Ambulatory Visit: Payer: Self-pay

## 2021-04-07 ENCOUNTER — Encounter (HOSPITAL_BASED_OUTPATIENT_CLINIC_OR_DEPARTMENT_OTHER): Payer: Medicare Other | Admitting: General Surgery

## 2021-04-07 DIAGNOSIS — I110 Hypertensive heart disease with heart failure: Secondary | ICD-10-CM | POA: Diagnosis not present

## 2021-04-07 DIAGNOSIS — G4730 Sleep apnea, unspecified: Secondary | ICD-10-CM | POA: Diagnosis not present

## 2021-04-07 DIAGNOSIS — E11621 Type 2 diabetes mellitus with foot ulcer: Secondary | ICD-10-CM | POA: Diagnosis not present

## 2021-04-07 DIAGNOSIS — I5022 Chronic systolic (congestive) heart failure: Secondary | ICD-10-CM | POA: Diagnosis not present

## 2021-04-07 DIAGNOSIS — L97519 Non-pressure chronic ulcer of other part of right foot with unspecified severity: Secondary | ICD-10-CM | POA: Diagnosis not present

## 2021-04-07 DIAGNOSIS — E1142 Type 2 diabetes mellitus with diabetic polyneuropathy: Secondary | ICD-10-CM | POA: Diagnosis not present

## 2021-04-07 DIAGNOSIS — E1151 Type 2 diabetes mellitus with diabetic peripheral angiopathy without gangrene: Secondary | ICD-10-CM | POA: Diagnosis not present

## 2021-04-07 DIAGNOSIS — L97512 Non-pressure chronic ulcer of other part of right foot with fat layer exposed: Secondary | ICD-10-CM | POA: Diagnosis not present

## 2021-04-07 DIAGNOSIS — I2510 Atherosclerotic heart disease of native coronary artery without angina pectoris: Secondary | ICD-10-CM | POA: Diagnosis not present

## 2021-04-07 DIAGNOSIS — F17210 Nicotine dependence, cigarettes, uncomplicated: Secondary | ICD-10-CM | POA: Diagnosis not present

## 2021-04-07 NOTE — Progress Notes (Signed)
[name], [name] ([id number]) ?Visit Report for 04/07/2021 ?Chief Complaint Document Details ?Patient Name: Date of Service: ?[name], [name] 04/07/2021 2:15 PM ?Medical Record Number: [id number] ?Patient Account Number: [id number] ?Date of Birth/Sex: Treating RN: ?1970-03-05 (51 y.o. M) ?Primary Care Provider: [name] Other Clinician: ?Referring Provider: ?Treating Provider/Extender: Duanne Guess ?[name] ?Weeks in Treatment: 3 ?Information Obtained from: Patient ?Chief Complaint ?03/22/2021: Right foot ulcer ?Electronic Signature(s) ?Signed: 04/07/2021 2:58:23 PM By: Duanne Guess MD FACS ?Entered By: Duanne Guess on 04/07/2021 14:58:23 ?-------------------------------------------------------------------------------- ?Debridement Details ?Patient Name: Date of Service: ?[name], [name] 04/07/2021 2:15 PM ?Medical Record Number: [id number] ?Patient Account Number: [id number] ?Date of Birth/Sex: Treating RN: ?27-Dec-1970 (51 y.o. [name]) Karie Schwalbe ?Primary Care Provider: [name] Other Clinician: ?Referring Provider: ?Treating Provider/Extender: Duanne Guess ?[name] ?Weeks in Treatment: 3 ?Debridement Performed for Assessment: Wound #4 Right Metatarsal head first ?Performed By: Physician Duanne Guess, MD ?Debridement Type: Debridement ?Severity of Tissue Pre Debridement: Fat layer exposed ?Level of Consciousness (Pre-procedure): Awake and Alert ?Pre-procedure Verification/Time Out Yes - 14:46 ?Taken: ?Start Time: 14:46 ?Pain Control: Other : ?T Area Debrided (L x W): ?otal 0.2 (cm) x 0.2 (cm) = 0.04 (cm?) ?Tissue and other material debrided: Non-Viable, Callus, Slough, Slough ?Level: Non-Viable Tissue ?Debridement Description: Selective/Open Wound ?Instrument: Curette ?Bleeding: Minimum ?Hemostasis Achieved: Pressure ?End Time: 14:47 ?Procedural Pain: 0 ?Post Procedural Pain: 0 ?Response to Treatment: Procedure was tolerated well ?Level of Consciousness (Post-  Awake and Alert ?procedure): ?Post Debridement Measurements of Total Wound ?Length: (cm) 0.2 ?Width: (cm) 0.2 ?Depth: (cm) 0.1 ?Volume: (cm?) 0.003 ?Character of Wound/Ulcer Post Debridement: Improved ?Severity of Tissue Post Debridement: Fat layer exposed ?Post Procedure Diagnosis ?Same as Pre-procedure ?Electronic Signature(s) ?Signed: 04/07/2021 3:04:13 PM By: Duanne Guess MD FACS ?Signed: 04/07/2021 5:30:22 PM By: Karie Schwalbe RN ?Entered By: Karie Schwalbe on 04/07/2021 14:51:48 ?-------------------------------------------------------------------------------- ?HPI Details ?Patient Name: Date of Service: ?[name], [name] 04/07/2021 2:15 PM ?Medical Record Number: [id number] ?Patient Account Number: [id number] ?Date of Birth/Sex: Treating RN: ?10/07/1970 (51 y.o. M) ?Primary Care Provider: [name] Other Clinician: ?Referring Provider: ?Treating Provider/Extender: Duanne Guess ?[name] ?Weeks in Treatment: 3 ?History of Present Illness ?HPI Description: ADMISSION ?05/13/2019 ?This is a 51 year old man who has type 2 diabetes with peripheral neuropathy. He has a history of wounds on the plantar aspect of his left first and fourth ?toes. He has had these for several months. He was being seen in the wound care center at [facility] in [facility]. He apparently presented ?with swelling and an open wound at the tip of the toe. An MRI showed osteomyelitis. He was given 6 to 8 weeks of oral doxycycline again all of this via the ?patient we have none of these records. Since then he has been putting [place] on the areas wearing regular shoes. He was seen by his primary physician on ?4/9 and sent down here for our review. In the primary care note it says he has been recommended for hyperbaric oxygen. ?Past medical history includes type 2 diabetes with peripheral neuropathy, heart failure with reduced ejection fraction 30 to 35%, peripheral arterial disease, ?coronary artery  disease, hyperlipidemia, hypertension, syncope and a prior history of a right great toe amputation also by Dr. Lynden Ang in [place] ?ABIs in our clinic were 0.91 on the left. Also notable that in 2019 he appears to have had arterial studies that are visible in our system. At that point the right ?ABI was 1.17,  TBI of 0.85 with triphasic waveforms. On the left his ABI was 1.19 with a TBI of 0.73 again with triphasic waveform ?5/3; [facility] The patient was seen there on 1/29. At that point he had wounds on the right foot at ?the amputation site the fifth digit fourth digit I am presuming on the right and then an area on the left first digit although that is not specifically stated, he has ?had a previous amputation on the right however. It would appear him at that time most of his ulcers were on the right the left foot is stated to have a lot of ?scales and calluses but not a lot of open wounds. The only wounds we define when he came in here last time were on the left first and left fourth toe ?He also had a wound culture that showed heavy growth of staph aureus and a slight growth of Stenotrophomonas maltophilia. The doxycycline should cover ?the staph aureus I am not sure about the stenotrophomonas. In any case he completed 6 weeks of this. ?UNFORTUNATELY I still do not have a copy of the MRI. And the exact justification for hyperbarics is therefore lacking. ?5/10; the patient had osteomyelitis in the left fourth toe. He was treated for 6 weeks of doxycycline this wound is healed as is the left first toe. He was sent ?here for hyperbaric oxygen however at this point his wounds are healed and I think a course of watchful waiting and observation is in order. If the left fourth ?toe reopens then it is likely he will need a more protracted and aggressive treatment approach ?Readmission: ?05/06/20 upon evaluation today patient presents for readmission here in the clinic exam  issues with his right foot on the medial aspect at the amputation site ?where his great toe was this is the first metatarsal area that is affected. He has been seeing Dr. Marcha Soldersathey who performed the surgery. With that being said ?currently the patient was diagnosed as having MRSA on a culture that was + March 31. Has been on IV vancomycin for 6 weeks he is now on doxycycline as ?the MRSA was sensitive to Doxy and he does have 2 more refills he has been on that for 3 weeks already. He has MRI of January for showed L knee first ?metatarsal base early osteomyelitis versus possible reactive disease but nonetheless based on what was seen it appears osteomyelitis is more likely the ?cause here. He has had Apligraf although to be honest that did not seem to do the job. He is also been using Dakin's solution and currently is using Aquacel. ?Sharp debridement has been performed by Dr. Marcha Soldersathey on a weekly basis according to what the patient tells me. With all that being said the patient was ?referred to us for further evaluation and treatment as unfortunately he does not seem to be responding well to his treatment with the surgeon. They have ?considered a total contact cast it sounds like but again with the infection that this was not a good idea. ?The patient does have a significant past medical history for diabetes mellitus type 2 for which she is on insulin, hypertension, significant congestive heart ?failure with a most recent ejection fraction of 30 to 35% and that was in 2020. He has not seen his cardiologist Dr. Wyline MoodBranch since that time. In regard to his ?diabetes his A1c he does not even know he tells me his blood sugars run somewhere in  the 200-300 range. With that being said he has not seen the primary ?care provider recently for management of this either. The patient tells me he is also a current smoker. He tells me he is trying to stop using nicotine pouches ?but he just does not have enough saliva he tells me his mouth  is dry all the time this is probably a subsequent issue from the diabetes as well. Unfortunately ?he just appears to be in a place where he is not necessarily at his healthiest even with his medical condit

## 2021-04-07 NOTE — Progress Notes (Signed)
[name], [name] ([id number]) ?Visit Report for 04/07/2021 ?Arrival Information Details ?Patient Name: Date of Service: ?[name], [name] 04/07/2021 2:15 PM ?Medical Record Number: [id number] ?Patient Account Number: [id number] ?Date of Birth/Sex: Treating RN: ?1970-06-26 (51 y.o. Cline Cools ?Primary Care Karington Zarazua: [name] Other Clinician: ?Referring Tyger Oka: ?Treating Sofie Schendel/Extender: Duanne Guess ?[name] ?Weeks in Treatment: 3 ?Visit Information History Since Last Visit ?Added or deleted any medications: No ?Patient Arrived: Ambulatory ?Any new allergies or adverse reactions: No ?Arrival Time: 14:26 ?Had a fall or experienced change in No ?Transfer Assistance: None ?activities of daily living that may affect ?Patient Identification Verified: Yes ?risk of falls: ?Secondary Verification Process Completed: Yes ?Signs or symptoms of abuse/neglect since last visito No ?Patient Requires Transmission-Based Precautions: No ?Hospitalized since last visit: No ?Patient Has Alerts: No ?Implantable device outside of the clinic excluding No ?cellular tissue based products placed in the center ?since last visit: ?Has Dressing in Place as Prescribed: Yes ?Has Footwear/Offloading in Place as Prescribed: Yes ?Right: T Contact Cast ?otal ?Pain Present Now: No ?Electronic Signature(s) ?Signed: 04/07/2021 4:21:04 PM By: Redmond Pulling RN, BSN ?Entered By: Redmond Pulling on 04/07/2021 14:27:42 ?-------------------------------------------------------------------------------- ?Encounter Discharge Information Details ?Patient Name: Date of Service: ?[name], [name] 04/07/2021 2:15 PM ?Medical Record Number: [id number] ?Patient Account Number: [id number] ?Date of Birth/Sex: Treating RN: ?09-25-1970 (51 y.o. Judie Petit) Karie Schwalbe ?Primary Care Genora Arp: [name] Other Clinician: ?Referring Lashaun Poch: ?Treating Ruben Mahler/Extender: Duanne Guess ?[name] ?Weeks in Treatment: 3 ?Encounter  Discharge Information Items Post Procedure Vitals ?Discharge Condition: Stable ?Temperature (F): 98.6 ?Ambulatory Status: Ambulatory ?Pulse (bpm): 98 ?Discharge Destination: Home ?Respiratory Rate (breaths/min): 18 ?Transportation: Other ?Blood Pressure (mmHg): 126/78 ?Accompanied By: self ?Schedule Follow-up Appointment: Yes ?Clinical Summary of Care: Patient Declined ?Electronic Signature(s) ?Signed: 04/07/2021 5:30:22 PM By: Karie Schwalbe RN ?Entered By: Karie Schwalbe on 04/07/2021 15:57:53 ?-------------------------------------------------------------------------------- ?Lower Extremity Assessment Details ?Patient Name: ?Date of Service: ?[name], [name] 04/07/2021 2:15 PM ?Medical Record Number: [id number] ?Patient Account Number: [id number] ?Date of Birth/Sex: ?Treating RN: ?1970/05/08 (51 y.o. Judie Petit) Karie Schwalbe ?Primary Care Rhian Funari: [name] ?Other Clinician: ?Referring Sahid Borba: ?Treating Mekenzie Modeste/Extender: Duanne Guess ?[name] ?Weeks in Treatment: 3 ?Edema Assessment ?Assessed: [Left: No] [Right: No] ?E[Left: dema] [Right: :] ?Calf ?Left: Right: ?Point of Measurement: 34 cm From Medial Instep 42.5 cm ?Ankle ?Left: Right: ?Point of Measurement: 11 cm From Medial Instep 24.5 cm ?Electronic Signature(s) ?Signed: 04/07/2021 5:30:22 PM By: Karie Schwalbe RN ?Entered By: Karie Schwalbe on 04/07/2021 14:40:52 ?-------------------------------------------------------------------------------- ?Multi Wound Chart Details ?Patient Name: ?Date of Service: ?[name], [name] 04/07/2021 2:15 PM ?Medical Record Number: [id number] ?Patient Account Number: [id number] ?Date of Birth/Sex: ?Treating RN: ?01/14/1971 (51 y.o. M) ?Primary Care Dewayne Severe: [name] ?Other Clinician: ?Referring Jamal Pavon: ?Treating Valincia Touch/Extender: Duanne Guess ?[name] ?Weeks in Treatment: 3 ?Vital Signs ?Height(in): 72 ?Capillary Blood Glucose(mg/dl): 983 ?Weight(lbs): 290 ?Pulse(bpm): 98 ?Body Mass  Index(BMI): 39.3 ?Blood Pressure(mmHg): 126/78 ?Temperature(??F): 98.6 ?Respiratory Rate(breaths/min): 18 ?Photos: [N/A:N/A] ?Right Metatarsal head first N/A N/A ?Wound Location: ?Trauma N/A N/A ?Wounding Event: ?Diabetic Wound/Ulcer of the Lower N/A N/A ?Primary Etiology: ?Extremity ?Sleep Apnea, Congestive Heart N/A N/A ?Comorbid History: ?Failure, Hypertension, Type II ?Diabetes, Neuropathy ?03/18/2019 N/A N/A ?Date Acquired: ?3 N/A N/A ?Weeks of Treatment: ?Open N/A N/A ?Wound Status: ?No N/A N/A ?Wound Recurrence: ?0.2x0.2x0.1 N/A N/A ?Measurements L x W x D (cm) ?0.031 N/A N/A ?A (cm?) : ?rea ?0.003 N/A N/A ?Volume (cm?) : ?99.60% N/A N/A ?% Reduction  in A rea: ?99.60% N/A N/A ?% Reduction in Volume: ?Grade 2 N/A N/A ?Classification: ?None Present N/A N/A ?Exudate A mount: ?Thickened N/A N/A ?Wound Margin: ?None Present (0%) N/A N/A ?Granulation A mount: ?None Present (0%) N/A N/A ?Necrotic A mount: ?Fascia: No N/A N/A ?Exposed Structures: ?Fat Layer (Subcutaneous Tissue): No ?Tendon: No ?Muscle: No ?Joint: No ?Bone: No ?Large (67-100%) N/A N/A ?Epithelialization: ?Debridement - Selective/Open Wound N/A N/A ?Debridement: ?Pre-procedure Verification/Time Out 14:46 N/A N/A ?Taken: ?Other N/A N/A ?Pain Control: ?Callus, Slough N/A N/A ?Tissue Debrided: ?Non-Viable Tissue N/A N/A ?Level: ?0.04 N/A N/A ?Debridement A (sq cm): ?rea ?Curette N/A N/A ?Instrument: ?Minimum N/A N/A ?Bleeding: ?Pressure N/A N/A ?Hemostasis A chieved: ?0 N/A N/A ?Procedural Pain: ?0 N/A N/A ?Post Procedural Pain: ?Procedure was tolerated well N/A N/A ?Debridement Treatment Response: ?0.2x0.2x0.1 N/A N/A ?Post Debridement Measurements L x ?W x D (cm) ?0.003 N/A N/A ?Post Debridement Volume: (cm?) ?Debridement N/A N/A ?Procedures Performed: ?T Contact Cast ?otal ?Treatment Notes ?Electronic Signature(s) ?Signed: 04/07/2021 2:58:15 PM By: Duanne Guess MD FACS ?Entered By: Duanne Guess on 04/07/2021  14:58:14 ?-------------------------------------------------------------------------------- ?Multi-Disciplinary Care Plan Details ?Patient Name: ?Date of Service: ?[name], [name] 04/07/2021 2:15 PM ?Medical Record Number: [id number] ?Patient Account Number: [id number] ?Date of Birth/Sex: ?Treating RN: ?[birth year] (51 y.o. Judie Petit) Karie Schwalbe ?Primary Care Shelbia Scinto: [name] ?Other Clinician: ?Referring Johnathyn Viscomi: ?Treating Santosh Petter/Extender: Duanne Guess ?[name] ?Weeks in Treatment: 3 ?Active Inactive ?Wound/Skin Impairment ?Nursing Diagnoses: ?Impaired tissue integrity ?Knowledge deficit related to smoking impact on wound healing ?Knowledge deficit related to ulceration/compromised skin integrity ?Goals: ?Patient will demonstrate a reduced rate of smoking or cessation of smoking ?Date Initiated: 03/17/2021 ?Target Resolution Date: 04/23/2021 ?Goal Status: Active ?Patient/caregiver will verbalize understanding of skin care regimen ?Date Initiated: 03/22/2021 ?Target Resolution Date: 04/23/2021 ?Goal Status: Active ?Interventions: ?Assess patient/caregiver ability to perform ulcer/skin care regimen upon admission and as needed ?Assess ulceration(s) every visit ?Provide education on smoking ?Provide education on ulcer and skin care ?Notes: ?Electronic Signature(s) ?Signed: 04/07/2021 5:30:22 PM By: Karie Schwalbe RN ?Entered By: Karie Schwalbe on 04/07/2021 15:53:34 ?-------------------------------------------------------------------------------- ?Pain Assessment Details ?Patient Name: ?Date of Service: ?[name], [name] 04/07/2021 2:15 PM ?Medical Record Number: [id number] ?Patient Account Number: [id number] ?Date of Birth/Sex: ?Treating RN: ?1970-08-07 (51 y.o. Cline Cools ?Primary Care Julieanna Geraci: [name] ?Other Clinician: ?Referring Danilynn Jemison: ?Treating Oliviya Gilkison/Extender: Duanne Guess ?[name] ?Weeks in Treatment: 3 ?Active Problems ?Location of Pain Severity and Description of  Pain ?Patient Has Paino No ?Site Locations ?Pain Management and Medication ?Current Pain Management: ?Electronic Signature(s) ?Signed: 04/07/2021 4:21:04 PM By: Redmond Pulling RN, BSN ?Entered By: Redmond Pulling on 04/07/2021 14:28:52 ?---------

## 2021-04-14 DIAGNOSIS — F17210 Nicotine dependence, cigarettes, uncomplicated: Secondary | ICD-10-CM | POA: Diagnosis not present

## 2021-04-14 DIAGNOSIS — Z122 Encounter for screening for malignant neoplasm of respiratory organs: Secondary | ICD-10-CM | POA: Diagnosis not present

## 2021-04-15 ENCOUNTER — Encounter (HOSPITAL_BASED_OUTPATIENT_CLINIC_OR_DEPARTMENT_OTHER): Payer: Medicare Other | Admitting: General Surgery

## 2021-04-15 DIAGNOSIS — I110 Hypertensive heart disease with heart failure: Secondary | ICD-10-CM | POA: Diagnosis not present

## 2021-04-15 DIAGNOSIS — E1142 Type 2 diabetes mellitus with diabetic polyneuropathy: Secondary | ICD-10-CM | POA: Diagnosis not present

## 2021-04-15 DIAGNOSIS — G4730 Sleep apnea, unspecified: Secondary | ICD-10-CM | POA: Diagnosis not present

## 2021-04-15 DIAGNOSIS — I2510 Atherosclerotic heart disease of native coronary artery without angina pectoris: Secondary | ICD-10-CM | POA: Diagnosis not present

## 2021-04-15 DIAGNOSIS — F17210 Nicotine dependence, cigarettes, uncomplicated: Secondary | ICD-10-CM | POA: Diagnosis not present

## 2021-04-15 DIAGNOSIS — L97519 Non-pressure chronic ulcer of other part of right foot with unspecified severity: Secondary | ICD-10-CM | POA: Diagnosis not present

## 2021-04-15 DIAGNOSIS — I5022 Chronic systolic (congestive) heart failure: Secondary | ICD-10-CM | POA: Diagnosis not present

## 2021-04-15 DIAGNOSIS — L97518 Non-pressure chronic ulcer of other part of right foot with other specified severity: Secondary | ICD-10-CM | POA: Diagnosis not present

## 2021-04-15 DIAGNOSIS — E1151 Type 2 diabetes mellitus with diabetic peripheral angiopathy without gangrene: Secondary | ICD-10-CM | POA: Diagnosis not present

## 2021-04-15 NOTE — Progress Notes (Addendum)
[name], [name] ([id number]) ?Visit Report for 04/15/2021 ?Arrival Information Details ?Patient Name: Date of Service: ?[name], [name] 04/15/2021 1:15 PM ?Medical Record Number: [id number] ?Patient Account Number: [id number] ?Date of Birth/Sex: Treating RN: ?07-May-1970 (51 y.o. M) ?Primary Care [name]: [name] Other Clinician: ?Referring Jerney Baksh: ?Treating Cha Gomillion/Extender: [name] ?[name] ?Weeks in Treatment: 4 ?Visit Information History Since Last Visit ?Added or deleted any medications: No ?Patient Arrived: [name] ?Any new allergies or adverse reactions: No ?Arrival Time: 13:27 ?Had a fall or experienced change in No ?Accompanied By: self ?activities of daily living that may affect ?Transfer Assistance: None ?risk of falls: ?Patient Identification Verified: Yes ?Signs or symptoms of abuse/neglect since last visito No ?Secondary Verification Process Completed: Yes ?Hospitalized since last visit: No ?Patient Requires Transmission-Based Precautions: No ?Implantable device outside of the clinic excluding No ?Patient Has Alerts: No ?cellular tissue based products placed in the center ?since last visit: ?Has Dressing in Place as Prescribed: Yes ?Pain Present Now: No ?Electronic Signature(s) ?Signed: 04/15/2021 1:42:55 PM By: [name] ?Entered By: [name] on 04/15/2021 13:27:47 ?-------------------------------------------------------------------------------- ?Clinic Level of Care Assessment Details ?Patient Name: Date of Service: ?[name], [name] 04/15/2021 1:15 PM ?Medical Record Number: [id number] ?Patient Account Number: [id number] ?Date of Birth/Sex: Treating RN: ?Sep 18, 1970 (51 y.o. [name]) Karie Schwalbe ?Primary Care [name]: [name] Other Clinician: ?Referring Javaris Wigington: ?Treating Verlaine Embry/Extender: [name] ?[name] ?Weeks in Treatment: 4 ?Clinic Level of Care Assessment Items ?TOOL 4 Quantity Score ?X- 1 0 ?Use when only an EandM is  performed on FOLLOW-UP visit ?ASSESSMENTS - Nursing Assessment / Reassessment ?X- 1 10 ?Reassessment of Co-morbidities (includes updates in patient status) ?X- 1 5 ?Reassessment of Adherence to Treatment Plan ?ASSESSMENTS - Wound and Skin A ssessment / Reassessment ?X - Simple Wound Assessment / Reassessment - one wound 1 5 ?[]  - 0 ?Complex Wound Assessment / Reassessment - multiple wounds ?[]  - 0 ?Dermatologic / Skin Assessment (not related to wound area) ?ASSESSMENTS - Focused Assessment ?[]  - 0 ?Circumferential Edema Measurements - multi extremities ?[]  - 0 ?Nutritional Assessment / Counseling / Intervention ?[]  - 0 ?Lower Extremity Assessment (monofilament, tuning fork, pulses) ?[]  - 0 ?Peripheral Arterial Disease Assessment (using hand held doppler) ?ASSESSMENTS - Ostomy and/or Continence Assessment and Care ?[]  - 0 ?Incontinence Assessment and Management ?[]  - 0 ?Ostomy Care Assessment and Management (repouching, etc.) ?PROCESS - Coordination of Care ?X - Simple Patient / Family Education for ongoing care 1 15 ?[]  - 0 ?Complex (extensive) Patient / Family Education for ongoing care ?X- 1 10 ?Staff obtains Consents, Records, T Results / Process Orders ?est ?X- 1 10 ?Staff telephones HHA, Nursing Homes / Clarify orders / etc ?[]  - 0 ?Routine Transfer to another Facility (non-emergent condition) ?[]  - 0 ?Routine Hospital Admission (non-emergent condition) ?[]  - 0 ?New Admissions / / Ordering NPWT Apligraf, etc. ?, ?[]  - 0 ?Emergency Hospital Admission (emergent condition) ?[]  - 0 ?Simple Discharge Coordination ?X- 1 15 ?Complex (extensive) Discharge Coordination ?PROCESS - Special Needs ?[]  - 0 ?Pediatric / Minor Patient Management ?[]  - 0 ?Isolation Patient Management ?[]  - 0 ?Hearing / Language / Visual special needs ?[]  - 0 ?Assessment of Community assistance (transportation, D/C planning, etc.) ?[]  - 0 ?Additional assistance / Altered mentation ?[]  - 0 ?Support Surface(s) Assessment  (bed, cushion, seat, etc.) ?INTERVENTIONS - Wound Cleansing / Measurement ?X - Simple Wound Cleansing - one wound 1 5 ?[]  - 0 ?Complex Wound Cleansing - multiple wounds ?  X- 1 5 ?Wound Imaging (photographs - any number of wounds) ?[]  - 0 ?Wound Tracing (instead of photographs) ?X- 1 5 ?Simple Wound Measurement - one wound ?[]  - 0 ?Complex Wound Measurement - multiple wounds ?INTERVENTIONS - Wound Dressings ?[]  - 0 ?Small Wound Dressing one or multiple wounds ?[]  - 0 ?Medium Wound Dressing one or multiple wounds ?[]  - 0 ?Large Wound Dressing one or multiple wounds ?[]  - 0 ?Application of Medications - topical ?[]  - 0 ?Application of Medications - injection ?INTERVENTIONS - Miscellaneous ?[]  - 0 ?External ear exam ?[]  - 0 ?Specimen Collection (cultures, biopsies, blood, body fluids, etc.) ?[]  - 0 ?Specimen(s) / Culture(s) sent or taken to Lab for analysis ?[]  - 0 ?Patient Transfer (multiple staff / / Similar devices) ?[]  - 0 ?Simple Staple / Suture removal (25 or less) ?[]  - 0 ?Complex Staple / Suture removal (26 or more) ?[]  - 0 ?Hypo / Hyperglycemic Management (close monitor of Blood Glucose) ?[]  - 0 ?Ankle / Brachial Index (ABI) - do not check if billed separately ?X- 1 5 ?Vital Signs ?Has the patient been seen at the hospital within the last three years: Yes ?Total Score: 90 ?Level Of Care: New/Established - Level 3 ?Electronic Signature(s) ?Signed: 04/15/2021 5:23:58 PM By: RN ?Entered By: on 04/15/2021 17:13:05 ?-------------------------------------------------------------------------------- ?Encounter Discharge Information Details ?Patient Name: Date of Service: ?[name], [name] 04/15/2021 1:15 PM ?Medical Record Number: ?Patient Account Number: ?Date of Birth/Sex: Treating RN: ?[birth year] (51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 ?Weeks in Treatment: 4 ?Encounter Discharge Information Items ?Discharge Condition: Stable ?Ambulatory Status: Ambulatory ?Discharge Destination: Home ?Transportation: Other ?Accompanied By: self ?Schedule Follow-up Appointment: Yes ?Clinical Summary of Care: Patient Declined ?Electronic Signature(s) ?Signed: 04/15/2021 5:23:58 PM By: 04/17/2021 RN ?Entered By: Karie Schwalbe on 04/15/2021 17:14:16 ?-------------------------------------------------------------------------------- ?Lower Extremity Assessment Details ?Patient Name: Date of Service: ?[name], [name] 04/15/2021 1:15 PM ?Medical Record Number: 04/17/2021 ?Patient Account Number: [id number] ?Date of Birth/Sex: Treating RN: ?1970/07/27 (51 y.o. 44) [name] ?Primary Care Ansh Fauble: Karie Schwalbe Other Clinician: ?Referring Angelo Prindle: ?Treating Shawniece Oyola/Extender: [name] ?[name] ?Weeks in Treatment: 4 ?Edema Assessment ?Assessed: [Left: No] [Right: No] ?[Left: Edema] [Right: :] ?Calf ?Left: Right: ?Point of Measurement: 34 cm From Medial Instep 44 cm ?Ankle ?Left: Right: ?Point of Measurement: 11 cm From Medial Instep 24.8 cm ?Electronic Signature(s) ?Signed: 04/15/2021 5:23:58 PM By: 04/17/2021 RN ?Entered By: Karie Schwalbe on 04/15/2021 14:00:10 ?-------------------------------------------------------------------------------- ?Multi Wound Chart Details ?Patient Name: ?Date of Service: ?[name], [name] 04/15/2021 1:15 PM ?Medical Record Number: 04/17/2021 ?Patient Account Number: [id number] ?Date of Birth/Sex: ?Treating RN: ?07-Sep-1970 (51 y.o. M) ?Primary Care Emmarae Cowdery: 43 ?Other Clinician: ?Referring Dajon Rowe: ?Treating Shell Yandow/Extender: [name] ?Karie Schwalbe ?Weeks in Treatment: 4 ?Vital Signs ?Height(in): 72 ?Capillary Blood Glucose(mg/dl): 99 ?Weight(lbs): 290 ?Pulse(bpm): 75 ?Body Mass Index(BMI): 39.3 ?Blood Pressure(mmHg): 120/79 ?Temperature(??F):  98.3 ?Respiratory Rate(breaths/min): 18 ?Photos: [N/A:N/A] ?Right Metatarsal head first N/A N/A ?Wound Location: ?Trauma N/A N/A ?Wounding Event: ?Diabetic Wound/Ulcer of the Lower N/A N/A ?Primary Etiology: ?Extremity ?Sleep Apnea,

## 2021-04-15 NOTE — Progress Notes (Signed)
[name], [name] ([id number]) ?Visit Report for 04/15/2021 ?Chief Complaint Document Details ?Patient Name: Date of Service: ?[name], [name] 04/15/2021 1:15 PM ?Medical Record Number: [id number] ?Patient Account Number: [id number] ?Date of Birth/Sex: Treating RN: ?[birth year] (51 y.o. M) ?Primary Care Provider: [name] Other Clinician: ?Referring Provider: ?Treating Provider/Extender: Duanne Guess ?[name] ?Weeks in Treatment: 4 ?Information Obtained from: Patient ?Chief Complaint ?03/22/2021: Right foot ulcer ?Electronic Signature(s) ?Signed: 04/15/2021 2:20:03 PM By: Duanne Guess MD FACS ?Entered By: Duanne Guess on 04/15/2021 14:20:03 ?-------------------------------------------------------------------------------- ?HPI Details ?Patient Name: Date of Service: ?[name], [name] 04/15/2021 1:15 PM ?Medical Record Number: [id number] ?Patient Account Number: [id number] ?Date of Birth/Sex: Treating RN: ?May 08, 1970 (51 y.o. M) ?Primary Care Provider: [name] Other Clinician: ?Referring Provider: ?Treating Provider/Extender: Duanne Guess ?[name] ?Weeks in Treatment: 4 ?History of Present Illness ?HPI Description: ADMISSION ?05/13/2019 ?This is a 51 year old man who has type 2 diabetes with peripheral neuropathy. He has a history of wounds on the plantar aspect of his left first and fourth ?toes. He has had these for several months. He was being seen in the wound care center at [facility] in [facility]. He apparently presented ?with swelling and an open wound at the tip of the toe. An MRI showed osteomyelitis. He was given 6 to 8 weeks of oral doxycycline again all of this via the ?patient we have none of these records. Since then he has been putting [place] on the areas wearing regular shoes. He was seen by his primary physician on ?4/9 and sent down here for our review. In the primary care note it says he has been recommended for hyperbaric  oxygen. ?Past medical history includes type 2 diabetes with peripheral neuropathy, heart failure with reduced ejection fraction 30 to 35%, peripheral arterial disease, ?coronary artery disease, hyperlipidemia, hypertension, syncope and a prior history of a right great toe amputation also by Dr. Lynden Ang in [place] ?ABIs in our clinic were 0.91 on the left. Also notable that in 2019 he appears to have had arterial studies that are visible in our system. At that point the right ?ABI was 1.17, TBI of 0.85 with triphasic waveforms. On the left his ABI was 1.19 with a TBI of 0.73 again with triphasic waveform ?5/3; we did receive some information from [facility] wound care. The patient was seen there on 1/29. At that point he had wounds on the right foot at ?the amputation site the fifth digit fourth digit I am presuming on the right and then an area on the left first digit although that is not specifically stated, he has ?had a previous amputation on the right however. It would appear him at that time most of his ulcers were on the right the left foot is stated to have a lot of ?scales and calluses but not a lot of open wounds. The only wounds we define when he came in here last time were on the left first and left fourth toe ?He also had a wound culture that showed heavy growth of staph aureus and a slight growth of Stenotrophomonas maltophilia. The doxycycline should cover ?the staph aureus I am not sure about the stenotrophomonas. In any case he completed 6 weeks of this. ?UNFORTUNATELY I still do not have a copy of the MRI. And the exact justification for hyperbarics is therefore lacking. ?5/10; the patient had osteomyelitis in the left fourth toe. He was treated for 6 weeks of doxycycline this wound is healed  as is the left first toe. He was sent ?here for hyperbaric oxygen however at this point his wounds are healed and I think a course of watchful waiting and observation is in order. If the left fourth ?toe reopens  then it is likely he will need a more protracted and aggressive treatment approach ?Readmission: ?05/06/20 upon evaluation today patient presents for readmission here in the clinic exam issues with his right foot on the medial aspect at the amputation site ?where his great toe was this is the first metatarsal area that is affected. He has been seeing Dr. Marcha Solders who performed the surgery. With that being said ?currently the patient was diagnosed as having MRSA on a culture that was + March 31. Has been on IV vancomycin for 6 weeks he is now on doxycycline as ?the MRSA was sensitive to Doxy and he does have 2 more refills he has been on that for 3 weeks already. He has MRI of January for showed L knee first ?metatarsal base early osteomyelitis versus possible reactive disease but nonetheless based on what was seen it appears osteomyelitis is more likely the ?cause here. He has had Apligraf although to be honest that did not seem to do the job. He is also been using Dakin's solution and currently is using Aquacel. ?Sharp debridement has been performed by Dr. Marcha Solders on a weekly basis according to what the patient tells me. With all that being said the patient was ?referred to [place] for further evaluation and treatment as unfortunately he does not seem to be responding well to his treatment with the surgeon. They have ?considered a total contact cast it sounds like but again with the infection that this was not a good idea. ?The patient does have a significant past medical history for diabetes mellitus type 2 for which she is on insulin, hypertension, significant congestive heart ?failure with a most recent ejection fraction of 30 to 35% and that was in 2020. He has not seen his cardiologist [name] since that time. In regard to his ?diabetes his A1c he does not even know he tells me his blood sugars run somewhere in the 200-300 range. With that being said he has not seen the primary ?care provider recently for management  of this either. The patient tells me he is also a current smoker. He tells me he is trying to stop using nicotine pouches ?but he just does not have enough saliva he tells me his mouth is dry all the time this is probably a subsequent issue from the diabetes as well. Unfortunately ?he just appears to be in a place where he is not necessarily at his healthiest even with his medical conditions. I think he would be a candidate for hyperbaric ?oxygen therapy but there are a lot of other things that need to be in place first before we get to that point. ?05/13/2020 patient presents for 1 week follow-up. He has been using Hydrofera Blue every other day without any issues. He had to reschedule his cardiologist ?appointment due to transportation Issues. He has no complaints today. ?05/20/2020 upon evaluation today patient appears to be doing well with regard to his foot ulcer all things considered. With that being said he tells me that he has ?been tolerating the dressing changes without complication. There does not appear to be any signs of active infection which is great news and overall very ?pleased with where things stand today. No fevers, chills, nausea, vomiting, or diarrhea. ?05/27/2020 upon evaluation  today patient appears to be doing well with regard to his wound on the foot. He does not require little bit of debridement today but ?overall he seems to be doing quite well. He actually has his echo on Monday we will see what that shows as well were hoping to be able to get him into the ?hyperbaric oxygen chamber but again a lot depends on what this test shows he will also need a chest x-ray if this turns out okay before going in the chamber. ?06/03/2020 upon evaluation today patient's wound actually showing signs of minimal improvement. Fortunately I think that he is continuing to make great ?progress all things considered. He does have his echocardiogram next week. That is good news so we can see whether or not  hyperbaric oxygen therapy would ?be appropriate for him I am hopeful we will be able to get him in the chamber sooner rather than later but again it all depends on his ejection fraction. Right now ?we have been somew

## 2021-04-21 ENCOUNTER — Ambulatory Visit: Payer: Medicare Other | Admitting: Podiatry

## 2021-05-03 DIAGNOSIS — E1142 Type 2 diabetes mellitus with diabetic polyneuropathy: Secondary | ICD-10-CM | POA: Diagnosis not present

## 2021-05-03 DIAGNOSIS — Z79891 Long term (current) use of opiate analgesic: Secondary | ICD-10-CM | POA: Diagnosis not present

## 2021-05-03 DIAGNOSIS — D512 Transcobalamin II deficiency: Secondary | ICD-10-CM | POA: Diagnosis not present

## 2021-05-03 DIAGNOSIS — M545 Low back pain, unspecified: Secondary | ICD-10-CM | POA: Diagnosis not present

## 2021-05-03 DIAGNOSIS — L282 Other prurigo: Secondary | ICD-10-CM | POA: Diagnosis not present

## 2021-05-03 DIAGNOSIS — G894 Chronic pain syndrome: Secondary | ICD-10-CM | POA: Diagnosis not present

## 2021-05-05 ENCOUNTER — Ambulatory Visit: Payer: Medicare Other | Admitting: Podiatry

## 2021-05-15 DIAGNOSIS — G4733 Obstructive sleep apnea (adult) (pediatric): Secondary | ICD-10-CM | POA: Diagnosis not present

## 2021-05-17 ENCOUNTER — Ambulatory Visit: Payer: Medicare Other | Admitting: Podiatry

## 2021-05-18 ENCOUNTER — Ambulatory Visit (INDEPENDENT_AMBULATORY_CARE_PROVIDER_SITE_OTHER): Payer: Medicare Other

## 2021-05-18 DIAGNOSIS — S91301A Unspecified open wound, right foot, initial encounter: Secondary | ICD-10-CM

## 2021-05-20 DIAGNOSIS — M868X7 Other osteomyelitis, ankle and foot: Secondary | ICD-10-CM | POA: Diagnosis not present

## 2021-05-20 DIAGNOSIS — I2510 Atherosclerotic heart disease of native coronary artery without angina pectoris: Secondary | ICD-10-CM | POA: Diagnosis not present

## 2021-05-20 DIAGNOSIS — I700 Atherosclerosis of aorta: Secondary | ICD-10-CM | POA: Diagnosis not present

## 2021-05-20 DIAGNOSIS — Z0000 Encounter for general adult medical examination without abnormal findings: Secondary | ICD-10-CM | POA: Diagnosis not present

## 2021-05-20 DIAGNOSIS — G4739 Other sleep apnea: Secondary | ICD-10-CM | POA: Diagnosis not present

## 2021-05-20 DIAGNOSIS — I10 Essential (primary) hypertension: Secondary | ICD-10-CM | POA: Diagnosis not present

## 2021-05-20 DIAGNOSIS — I5022 Chronic systolic (congestive) heart failure: Secondary | ICD-10-CM | POA: Diagnosis not present

## 2021-05-20 DIAGNOSIS — E1142 Type 2 diabetes mellitus with diabetic polyneuropathy: Secondary | ICD-10-CM | POA: Diagnosis not present

## 2021-05-26 ENCOUNTER — Ambulatory Visit (INDEPENDENT_AMBULATORY_CARE_PROVIDER_SITE_OTHER): Payer: Medicare Other | Admitting: Podiatry

## 2021-05-26 DIAGNOSIS — M79674 Pain in right toe(s): Secondary | ICD-10-CM | POA: Diagnosis not present

## 2021-05-26 DIAGNOSIS — E0843 Diabetes mellitus due to underlying condition with diabetic autonomic (poly)neuropathy: Secondary | ICD-10-CM

## 2021-05-26 DIAGNOSIS — B351 Tinea unguium: Secondary | ICD-10-CM | POA: Diagnosis not present

## 2021-05-26 DIAGNOSIS — M79675 Pain in left toe(s): Secondary | ICD-10-CM

## 2021-05-26 NOTE — Progress Notes (Addendum)
? ?SUBJECTIVE ?Patient with a history of diabetes mellitus presents to office today complaining of elongated, thickened nails that cause pain while ambulating in shoes.  Patient is unable to trim their own nails.  Patient does have history of partial first ray amputation to the right foot with ulcer development.  He was recently discharged from the wound care center since the ulcer has healed.  Patient is here for further evaluation and treatment. ? ? ?Past Medical History:  ?Diagnosis Date  ? Angina pectoris with documented spasm ([place])   ? CAD (coronary artery disease)   ? a. cath in 07/2017 showing occluded LCx and occluded RCA filled by collaterals with consideration of PCI to CTO RCA and LCx if myocardium viable --> did not follow-up after cath  ? CHF (congestive heart failure) ([place])   ? a. EF 30-35% in 07/2017 b. similar results by repeat echo in 04/2018  ? Corns and callosities   ? Diabetes mellitus without complication ([place])   ? Hyperlipidemia   ? Hypertension   ? Other sleep apnea   ? Pancreatitis   ? ?Past Surgical History:  ?Procedure Laterality Date  ? APPENDECTOMY  2016  ? OTHER SURGICAL HISTORY  2016  ? RIGHT/LEFT HEART CATH AND CORONARY ANGIOGRAPHY N/A 08/08/2017  ? Procedure: RIGHT/LEFT HEART CATH AND CORONARY ANGIOGRAPHY;  Surgeon: Jettie Booze, MD;  Location: [facility];  Service: Cardiovascular;  Laterality: N/A;  ? ?No Known Allergies ? ?OBJECTIVE ?General Patient is awake, alert, and oriented x 3 and in no acute distress. ?Derm Skin is dry and supple bilateral. Negative open lesions or macerations. Remaining integument unremarkable. Nails are tender, long, thickened and dystrophic with subungual debris, consistent with onychomycosis, 1-5 bilateral. No signs of infection noted. ?Vasc  DP and PT pedal pulses palpable bilaterally. Temperature gradient within normal limits.  ?VAS [place] ABI W/WO TBI 05/18/2021 ?Summary:  ?Right: Resting right ankle-brachial index is within normal range. No   ?evidence of significant right lower extremity arterial disease. The right  ?toe-brachial index is normal.  ?Left: Resting left ankle-brachial index is within normal range. No  ?evidence of significant left lower extremity arterial disease. The left  ?toe-brachial index is normal.  ?Neuro Epicritic and protective threshold sensation absent bilaterally.  ?Musculoskeletal Exam PSxHx partial first ray amputation with routine healing RT foot ? ?ASSESSMENT ?1. Diabetes Mellitus w/ peripheral neuropathy ?2.  Pain due to onychomycosis of toenails bilateral ?3. PSxHx partial first ray amputation RT foot ? ?PLAN OF CARE ?1. Patient evaluated today.  Comprehensive diabetic foot exam performed today ?2. Instructed to maintain good pedal hygiene and foot care. Stressed importance of controlling blood sugar.  ?3. Mechanical debridement of nails 1-5 bilaterally performed using a nail nipper. Filed with dremel without incident.  ?4.  Excisional debridement of the hyperkeratotic preulcerative callus lesion was performed to the plantar aspect of the right foot  ?5.  Appointment with Pedorthist for new diabetic shoes and custom molded insoles  ?6.  Return to clinic in 3 mos. for routine foot care ? ? ? ?Edrick Kins, DPM ?[facility] ? ?Dr. Edrick Kins, DPM  ?  ?[address]                                      ?[place], [place] [address]                ?  Office [phone]  ?Fax [phone] ? ? ? ? ? ?

## 2021-06-01 ENCOUNTER — Other Ambulatory Visit: Payer: Medicare Other

## 2021-06-10 ENCOUNTER — Ambulatory Visit: Payer: Medicare Other

## 2021-06-10 DIAGNOSIS — Z89411 Acquired absence of right great toe: Secondary | ICD-10-CM

## 2021-06-10 DIAGNOSIS — E0843 Diabetes mellitus due to underlying condition with diabetic autonomic (poly)neuropathy: Secondary | ICD-10-CM

## 2021-06-10 NOTE — Progress Notes (Signed)
SITUATION Reason for Consult: Evaluation for Prefabricated Diabetic Shoes and Custom Diabetic Inserts. Patient / Caregiver Report: Patient would like well fitting shoes  OBJECTIVE DATA: Patient History / Diagnosis:    ICD-10-CM   1. Diabetes mellitus due to underlying condition with diabetic autonomic neuropathy, unspecified whether long term insulin use (HCC)  E08.43     2. Acquired absence of right great toe [facility])  Z89.411       Physician Treating Diabetes:  Myra Gianotti Hasanaj  Current or Previous Devices:   None and no history  In-Person Foot Examination: Ulcers & Callousing:   Historical Deformities:    Right hallux amp Sensation:    compromised  Shoe Size:     13W  ORTHOTIC RECOMMENDATION Recommended Devices: - 1x pair prefabricated PDAC approved diabetic shoes; Patient Selected Orthofeet [place] [address]52 Size 13W - 3x pair custom-to-patient PDAC approved vacuum formed diabetic insoles.  GOALS OF SHOES AND INSOLES - Reduce shear and pressure - Reduce / Prevent callus formation - Reduce / Prevent ulceration - Protect the fragile healing compromised diabetic foot.  Patient would benefit from diabetic shoes and inserts as patient has diabetes mellitus and the patient has one or more of the following conditions: - History of partial or complete amputation of the foot - History of previous foot ulceration. - History of pre-ulcerative callus - Peripheral neuropathy with evidence of callus formation - Foot deformity - Poor circulation  ACTIONS PERFORMED Potential out of pocket cost was communicated to patient. Patient understood and consented to measurement and casting. Patient was casted for insoles via crush box and measured for shoes via brannock device. Procedure was explained and patient tolerated procedure well. All questions were answered and concerns addressed. CMN Sent to Treating Physician. Casts were shipped to central fabrication for HOLD until Certificate of Medical  Necessity or otherwise necessary authorization from insurance is obtained.  PLAN Shoes are to be ordered and casts released from hold once all appropriate paperwork is complete. Patient is to be contacted and scheduled for fitting once shoes and insoles have been fabricated and received.

## 2021-06-24 ENCOUNTER — Encounter: Payer: Self-pay | Admitting: *Deleted

## 2021-06-24 ENCOUNTER — Ambulatory Visit (INDEPENDENT_AMBULATORY_CARE_PROVIDER_SITE_OTHER): Payer: Medicare Other | Admitting: Cardiology

## 2021-06-24 ENCOUNTER — Telehealth: Payer: Self-pay

## 2021-06-24 ENCOUNTER — Encounter: Payer: Self-pay | Admitting: Cardiology

## 2021-06-24 VITALS — BP 120/72 | HR 67 | Ht 72.0 in | Wt 303.6 lb

## 2021-06-24 DIAGNOSIS — I10 Essential (primary) hypertension: Secondary | ICD-10-CM

## 2021-06-24 DIAGNOSIS — I5022 Chronic systolic (congestive) heart failure: Secondary | ICD-10-CM | POA: Diagnosis not present

## 2021-06-24 DIAGNOSIS — I2510 Atherosclerotic heart disease of native coronary artery without angina pectoris: Secondary | ICD-10-CM | POA: Diagnosis not present

## 2021-06-24 NOTE — Telephone Encounter (Signed)
CMN Received - Shoes ordered and casts released from fabrication hold.  Orthofeet 652 13W

## 2021-06-24 NOTE — Patient Instructions (Signed)
Medication Instructions:  Your physician recommends that you continue on your current medications as directed. Please refer to the Current Medication list given to you today.  Labwork: none  Testing/Procedures: Your physician has requested that you have an echocardiogram. Echocardiography is a painless test that uses sound waves to create images of your heart. It provides your doctor with information about the size and shape of your heart and how well your heart's chambers and valves are working. This procedure takes approximately one hour. There are no restrictions for this procedure.  Follow-Up: Your physician recommends that you schedule a follow-up appointment in: 4 months  Any Other Special Instructions Will Be Listed Below (If Applicable).  If you need a refill on your cardiac medications before your next appointment, please call your pharmacy. 

## 2021-06-24 NOTE — Progress Notes (Signed)
Clinical Summary [name] is a 51 y.o.male seen today for follow up of the following medical problems.    1. Chronic systolic HF/ICM/CAD - 05/2017 nuclear stress: Large fixed defects anterior, lateral, inferior myocardium. NO reversibility. LVEF 17%.  - 07/2017 echo LVEF 30-35%   07/2017 cath LAD 25%, ramus intermedius 50%, LCX occluded, RCA occluded fills by collaterals.  RHC mean PA 33, PCWP 22, CI 2.1 - from cath note could consider PCI to CTO RCA and LCX if myocardium viable. Patient never followed up in clinic after cath.        04/2018 echo LVEF 30-35%, grade II diastolic dysfunction - no chest pain, no SOB/DOE - compliant with meds   2. HTN -he is compliant withmeds   3. Foot sore/Osteomyelitis - normal ABIs 08/2017 - s/p amputation of great toe, has healed.  - followed by wound care  - reports sore has healed    4. Syncope -prior episodes of syncope.  first episode about 1 week ago while at home. Got up and took a few steps, became very dizzy and fell to ground. Unsure how long he was out, he thinks short period of time. Got up and walked to bed, feeling light headed. Stood up again and walked to the door again, repeat episode. Got back in bed and slept for some time. No repeat since. Has small AC at his home, his apartment stays very hot particularly that weekend. No N/VD. Working to stay hydrated. No EtoH, no significant caffeine intake.      - admission last month for AKI while outside in the heat, had some N/V. Found to be hypotensive - multiple admits in the past for dehydrations   - no recent issues   SH: has 3 pitbulls at home       Past Medical History:  Diagnosis Date   Angina pectoris with documented spasm (HCC)    CAD (coronary artery disease)    a. cath in 07/2017 showing occluded LCx and occluded RCA filled by collaterals with consideration of PCI to CTO RCA and LCx if myocardium viable --> did not follow-up after cath   CHF (congestive  heart failure) (HCC)    a. EF 30-35% in 07/2017 b. similar results by repeat echo in 04/2018   Corns and callosities    Diabetes mellitus without complication (HCC)    Hyperlipidemia    Hypertension    Other sleep apnea    Pancreatitis      No Known Allergies   Current Outpatient Medications  Medication Sig Dispense Refill   ACCU-CHEK GUIDE test strip 3 (three) times daily.     Alcohol Swabs (ALCOHOL PADS) 70 % PADS SMARTSIG:Pledget(s) Topical 3 Times Daily     amLODipine (NORVASC) 5 MG tablet Take 5 mg by mouth daily.     aspirin EC 81 MG tablet Take 1 tablet (81 mg total) by mouth daily. 90 tablet 3   buprenorphine (SUBUTEX) 2 MG SUBL SL tablet 2 mg every 8 (eight) hours.     dapagliflozin propanediol (FARXIGA) 10 MG TABS tablet Take 10 mg by mouth daily.     DULoxetine (CYMBALTA) 60 MG capsule Take 60 mg by mouth daily.      FLUoxetine (PROZAC) 20 MG capsule Take 20 mg by mouth every morning.     FLUoxetine (PROZAC) 40 MG capsule Take by mouth.     gabapentin (NEURONTIN) 300 MG capsule Take 300 mg by mouth 4 (four) times daily.  gabapentin (NEURONTIN) 400 MG capsule Take 400 mg by mouth 4 (four) times daily.     hydrOXYzine (ATARAX) 25 MG tablet Take 25 mg by mouth 2 (two) times daily as needed.     IBU 800 MG tablet Take 800 mg by mouth every 8 (eight) hours as needed.     insulin lispro (HUMALOG) 100 UNIT/ML injection Inject 15 Units into the skin 3 (three) times daily before meals.     Insulin Pen Needle (EXEL COMFORT POINT PEN NEEDLE) 31G X 6 MM MISC USE ONCE DAILY TO INJECT INSULIN     LEVEMIR FLEXTOUCH 100 UNIT/ML Pen Inject 50 Units into the skin 2 (two) times daily. Dependant upon blood sugar level  2   metFORMIN (GLUCOPHAGE) 1000 MG tablet Take 1,000 mg by mouth 2 (two) times daily with a meal.     metoprolol succinate (TOPROL-XL) 50 MG 24 hr tablet TAKE ONE TABLET BY MOUTH DAILY. TAKE WITH OR IMMEDIATELY FOLLOWING A MEAL. 90 tablet 0   Omega-3 Fatty Acids (FISH OIL)  1000 MG CAPS Take 1 capsule by mouth 3 (three) times daily.     Pro Comfort Lancets 30G MISC 3 (three) times daily.     promethazine (PHENERGAN) 25 MG tablet Take 25 mg by mouth.     sacubitril-valsartan (ENTRESTO) 49-51 MG Take 1 tablet by mouth 2 (two) times daily. 60 tablet 2   simvastatin (ZOCOR) 20 MG tablet Take 20 mg by mouth daily.     sodium chloride irrigation 0.9 % irrigation SMARTSIG:Topical     traMADol (ULTRAM) 50 MG tablet Take 100 mg by mouth every 6 (six) hours.     vitamin B-12 (CYANOCOBALAMIN) 1000 MCG tablet Take 1,000 mcg by mouth daily.     Vitamin D, Ergocalciferol, (DRISDOL) 1.25 MG (50000 UNIT) CAPS capsule Take 50,000 Units by mouth once a week.     Vitamin D-Vitamin K (DECARA K) 1250-200 MCG CAPS Take 50,000 Units by mouth once a week.     Zinc 50 MG TABS Take 1 tablet by mouth every morning.     No current facility-administered medications for this visit.     Past Surgical History:  Procedure Laterality Date   APPENDECTOMY  2016   OTHER SURGICAL HISTORY  2016   RIGHT/LEFT HEART CATH AND CORONARY ANGIOGRAPHY N/A 08/08/2017   Procedure: RIGHT/LEFT HEART CATH AND CORONARY ANGIOGRAPHY;  Surgeon: Corky CraftsVaranasi, Jayadeep S, MD;  [facility];  Service: Cardiovascular;  Laterality: N/A;     No Known Allergies    Family History  Problem Relation Age of Onset   Aneurysm Father      Social History [name] reports that he quit smoking about 4 years ago. His smoking use included e-cigarettes. He has never used smokeless tobacco. Mr. [name] reports no history of alcohol use.   Review of Systems CONSTITUTIONAL: No weight loss, fever, chills, weakness or fatigue.  HEENT: Eyes: No visual loss, blurred vision, double vision or yellow sclerae.No hearing loss, sneezing, congestion, runny nose or sore throat.  SKIN: No rash or itching.  CARDIOVASCULAR: per hpi RESPIRATORY: No shortness of breath, cough or sputum.  GASTROINTESTINAL: No  anorexia, nausea, vomiting or diarrhea. No abdominal pain or blood.  GENITOURINARY: No burning on urination, no polyuria NEUROLOGICAL: No headache, dizziness, syncope, paralysis, ataxia, numbness or tingling in the extremities. No change in bowel or bladder control.  MUSCULOSKELETAL: No muscle, back pain, joint pain or stiffness.  LYMPHATICS: No enlarged nodes. No history of splenectomy.  PSYCHIATRIC: No history of depression or anxiety.  ENDOCRINOLOGIC: No reports of sweating, cold or heat intolerance. No polyuria or polydipsia.  Marland Kitchen   Physical Examination Today's Vitals   06/24/21 1507  BP: 120/72  Pulse: 67  SpO2: 97%  Weight: (!) 303 lb 9.6 oz (137.7 kg)  Height: 6' (1.829 m)   Body mass index is 41.18 kg/m.  Gen: resting comfortably, no acute distress HEENT: no scleral icterus, pupils equal round and reactive, no palptable cervical adenopathy,  CV: RRR, no m/r/g, no jvd Resp: Clear to auscultation bilaterally GI: abdomen is soft, non-tender, non-distended, normal bowel sounds, no hepatosplenomegaly MSK: extremities are warm, no edema.  Skin: warm, no rash Neuro:  no focal deficits Psych: appropriate affect   Diagnostic Studies  07/2017 echo LVEF 30-35%, grade I diastolic dysfunction, multiple WMAs, mild MR, mild RV dysfunction   07/2017 cath Mid RCA lesion is 100% stenosed. Vessel fills by collaterals. Prox Cx to Mid Cx lesion is 100% stenosed. Vesel fills by left to left collaterals. Ramus lesion is 50% stenosed. Mid LAD lesion is 25% stenosed. LV end diastolic pressure is moderately elevated. There is no aortic valve stenosis. Hemodynamic findings consistent with mild pulmonary hypertension. Ao sat: 96%, PA sat 63%; CO: 5.4 L.min; CI: 2.1     Recommend Aspirin 81mg  daily for moderate CAD.  Consider CTO PCI attempt if the inferior and lateral myocardium is viable, after his foot ulcer has been addressed.      08/2017 ABI Final Interpretation: Right: Resting  right ankle-brachial index is within normal range. No evidence of significant right lower extremity arterial disease. The right toe-brachial index is normal.   Left: Resting left ankle-brachial index is within normal range. No evidence of significant left lower extremity arterial disease. The left toe-brachial index is normal. PPG tracings appear dampened.   04/2018 echo IMPRESSIONS      1. The left ventricle has moderate-severely reduced systolic function, with an ejection fraction of 30-35%. The cavity size was moderately dilated. Left ventricular diastolic Doppler parameters are consistent with pseudonormalization.  2. There is akinesis of the left ventricular, basal-mid inferior wall and lateral wall.  3. The right ventricle has normal systolic function. The cavity was normal. There is no increase in right ventricular wall thickness. Right ventricular systolic pressure could not be assessed.  4. The aortic valve is tricuspid. Mild to moderate aortic annular calcification noted.  5. The mitral valve is grossly normal. There is mild mitral annular calcification present. There is calcification of the posteromedial papillary muscle and apparatus.  6. The tricuspid valve is grossly normal.  7. The aortic root is normal in size and structure.  8. The interatrial septum was not assessed.   Assessment and Plan   1. Chronic systolic HF/ICM/CAD - management has been limited due to mixed compliance with meds and follow up. Titration of meds also limited by orthostastic symptoms - reports recent compliance.  - he is on toprol, entresto, farxiga. Repeat echo, pending LVEF add aldactone and possibly titrate entresto. HR 67 today, unclear if would tolerate high toprol dosing - euvoelmic without symptoms   2. HTN - at goal, continue current meds     F/u 4 months. If echo LVEF remains decreased add aldactone 12.5mg  daily.      05/2018, M.D.

## 2021-06-30 DIAGNOSIS — D512 Transcobalamin II deficiency: Secondary | ICD-10-CM | POA: Diagnosis not present

## 2021-06-30 DIAGNOSIS — Z79891 Long term (current) use of opiate analgesic: Secondary | ICD-10-CM | POA: Diagnosis not present

## 2021-06-30 DIAGNOSIS — E1142 Type 2 diabetes mellitus with diabetic polyneuropathy: Secondary | ICD-10-CM | POA: Diagnosis not present

## 2021-06-30 DIAGNOSIS — L282 Other prurigo: Secondary | ICD-10-CM | POA: Diagnosis not present

## 2021-06-30 DIAGNOSIS — G894 Chronic pain syndrome: Secondary | ICD-10-CM | POA: Diagnosis not present

## 2021-06-30 DIAGNOSIS — M545 Low back pain, unspecified: Secondary | ICD-10-CM | POA: Diagnosis not present

## 2021-07-12 ENCOUNTER — Ambulatory Visit (INDEPENDENT_AMBULATORY_CARE_PROVIDER_SITE_OTHER): Payer: Medicare Other

## 2021-07-12 DIAGNOSIS — I5022 Chronic systolic (congestive) heart failure: Secondary | ICD-10-CM | POA: Diagnosis not present

## 2021-07-12 LAB — ECHOCARDIOGRAM COMPLETE
Area-P 1/2: 3.08 cm2
Calc EF: 35.2 %
MV M vel: 1.83 m/s
MV Peak grad: 13.4 mmHg
S' Lateral: 5.44 cm
Single Plane A2C EF: 35.4 %
Single Plane A4C EF: 35 %

## 2021-07-30 ENCOUNTER — Telehealth: Payer: Self-pay | Admitting: Cardiology

## 2021-07-30 NOTE — Telephone Encounter (Signed)
Addressed in result notes  

## 2021-07-30 NOTE — Telephone Encounter (Signed)
Pt is returning call in regards to echo results. Transferred to Garnet [place], LPN

## 2021-08-02 ENCOUNTER — Telehealth: Payer: Self-pay | Admitting: Cardiology

## 2021-08-02 DIAGNOSIS — I5022 Chronic systolic (congestive) heart failure: Secondary | ICD-10-CM

## 2021-08-02 MED ORDER — SPIRONOLACTONE 25 MG PO TABS: 12.5000 mg | ORAL_TABLET | Freq: Every day | ORAL | 1 refills | Status: DC

## 2021-08-02 NOTE — Telephone Encounter (Signed)
Pt states that in his results from echo, he was told that he would be starting aldactone however he hasn't received it to his pharmacy yet. Please advise. Pharmacy listed below  [facility] - [place], [place] - [address]

## 2021-08-02 NOTE — Telephone Encounter (Signed)
lmtcb

## 2021-08-02 NOTE — Telephone Encounter (Signed)
Patient made aware that prescription has been sent to pharmacy and order for BMET is at the front desk for pickup.

## 2021-08-03 ENCOUNTER — Ambulatory Visit (INDEPENDENT_AMBULATORY_CARE_PROVIDER_SITE_OTHER): Payer: Medicare Other

## 2021-08-03 DIAGNOSIS — E0843 Diabetes mellitus due to underlying condition with diabetic autonomic (poly)neuropathy: Secondary | ICD-10-CM

## 2021-08-03 DIAGNOSIS — E1140 Type 2 diabetes mellitus with diabetic neuropathy, unspecified: Secondary | ICD-10-CM | POA: Diagnosis not present

## 2021-08-03 DIAGNOSIS — L84 Corns and callosities: Secondary | ICD-10-CM | POA: Diagnosis not present

## 2021-08-03 DIAGNOSIS — Z89411 Acquired absence of right great toe: Secondary | ICD-10-CM | POA: Diagnosis not present

## 2021-08-03 NOTE — Progress Notes (Signed)
Patient presents today to pick up diabetic shoes and insoles.  Patient was dispensed 1 pair of diabetic shoes and 3 left and 1 right filler of foam casted diabetic insoles. Fit was satisfactory. Instructions for break-in and wear was reviewed and a copy was given to the patient.   Re-appointment for regularly scheduled diabetic foot care visits or if they should experience any trouble with the shoes or insoles.

## 2021-08-19 DIAGNOSIS — E1142 Type 2 diabetes mellitus with diabetic polyneuropathy: Secondary | ICD-10-CM | POA: Diagnosis not present

## 2021-08-19 DIAGNOSIS — I700 Atherosclerosis of aorta: Secondary | ICD-10-CM | POA: Diagnosis not present

## 2021-08-19 DIAGNOSIS — I5022 Chronic systolic (congestive) heart failure: Secondary | ICD-10-CM | POA: Diagnosis not present

## 2021-08-19 DIAGNOSIS — I10 Essential (primary) hypertension: Secondary | ICD-10-CM | POA: Diagnosis not present

## 2021-08-19 DIAGNOSIS — G4739 Other sleep apnea: Secondary | ICD-10-CM | POA: Diagnosis not present

## 2021-08-19 DIAGNOSIS — M868X7 Other osteomyelitis, ankle and foot: Secondary | ICD-10-CM | POA: Diagnosis not present

## 2021-08-19 DIAGNOSIS — I2510 Atherosclerotic heart disease of native coronary artery without angina pectoris: Secondary | ICD-10-CM | POA: Diagnosis not present

## 2021-08-25 DIAGNOSIS — L282 Other prurigo: Secondary | ICD-10-CM | POA: Diagnosis not present

## 2021-08-25 DIAGNOSIS — M545 Low back pain, unspecified: Secondary | ICD-10-CM | POA: Diagnosis not present

## 2021-08-25 DIAGNOSIS — D512 Transcobalamin II deficiency: Secondary | ICD-10-CM | POA: Diagnosis not present

## 2021-08-25 DIAGNOSIS — E1142 Type 2 diabetes mellitus with diabetic polyneuropathy: Secondary | ICD-10-CM | POA: Diagnosis not present

## 2021-08-25 DIAGNOSIS — Z79891 Long term (current) use of opiate analgesic: Secondary | ICD-10-CM | POA: Diagnosis not present

## 2021-08-27 ENCOUNTER — Encounter: Payer: Self-pay | Admitting: Podiatry

## 2021-08-27 ENCOUNTER — Ambulatory Visit (INDEPENDENT_AMBULATORY_CARE_PROVIDER_SITE_OTHER): Payer: Medicare Other | Admitting: Podiatry

## 2021-08-27 DIAGNOSIS — M79675 Pain in left toe(s): Secondary | ICD-10-CM

## 2021-08-27 DIAGNOSIS — Z89411 Acquired absence of right great toe: Secondary | ICD-10-CM | POA: Diagnosis not present

## 2021-08-27 DIAGNOSIS — B351 Tinea unguium: Secondary | ICD-10-CM | POA: Diagnosis not present

## 2021-08-27 DIAGNOSIS — L84 Corns and callosities: Secondary | ICD-10-CM

## 2021-08-27 DIAGNOSIS — E0843 Diabetes mellitus due to underlying condition with diabetic autonomic (poly)neuropathy: Secondary | ICD-10-CM

## 2021-08-27 DIAGNOSIS — M79674 Pain in right toe(s): Secondary | ICD-10-CM

## 2021-08-27 NOTE — Progress Notes (Signed)
This patient returns to my office for at risk foot care.  This patient requires this care by a professional since this patient will be at risk due to having diabetes with amputation 1st ray right foot.  Painful callus at the amputation site right foot.  This patient is unable to cut nails himself since the patient cannot reach his nails.These nails are painful walking and wearing shoes. Painful callus right foot. This patient presents for at risk foot care today.  General Appearance  Alert, conversant and in no acute stress.  Vascular  Dorsalis pedis and posterior tibial  pulses are palpable  bilaterally.  Capillary return is within normal limits  bilaterally. Temperature is within normal limits  bilaterally.  Neurologic  Senn-Weinstein monofilament wire test absent   bilaterally. Muscle power within normal limits bilaterally.  Nails Thick disfigured discolored nails with subungual debris  from hallux to fifth toes left foot and 2-5 right foot. No evidence of bacterial infection or drainage bilaterally.  Orthopedic  No limitations of motion  feet .  No crepitus or effusions noted.  No bony pathology or digital deformities noted.  Skin  normotropic skin with no porokeratosis noted bilaterally.  No signs of infections .  Preulcerous callus at site of amputation.  Onychomycosis  Pain in right toes  Pain in left toes  Callus right foot.  Consent was obtained for treatment procedures.   Mechanical debridement of nails 1-5  bilaterally performed with a nail nipper.  Filed with dremel without incident.  Debride callus with dremel tool right foot.   Return office visit   3 months                   Told patient to return for periodic foot care and evaluation due to potential at risk complications.   Helane Gunther DPM

## 2021-09-21 DIAGNOSIS — G4733 Obstructive sleep apnea (adult) (pediatric): Secondary | ICD-10-CM | POA: Diagnosis not present

## 2021-09-27 IMAGING — DX DG LUMBAR SPINE COMPLETE 4+V
5 series · 5 of 5 positions shown · non-contrast
Comparison: None.

CLINICAL DATA: Chronic lower back pain without known injury.

EXAM:
LUMBAR SPINE - COMPLETE 4+ VIEW

[l-spine ap]
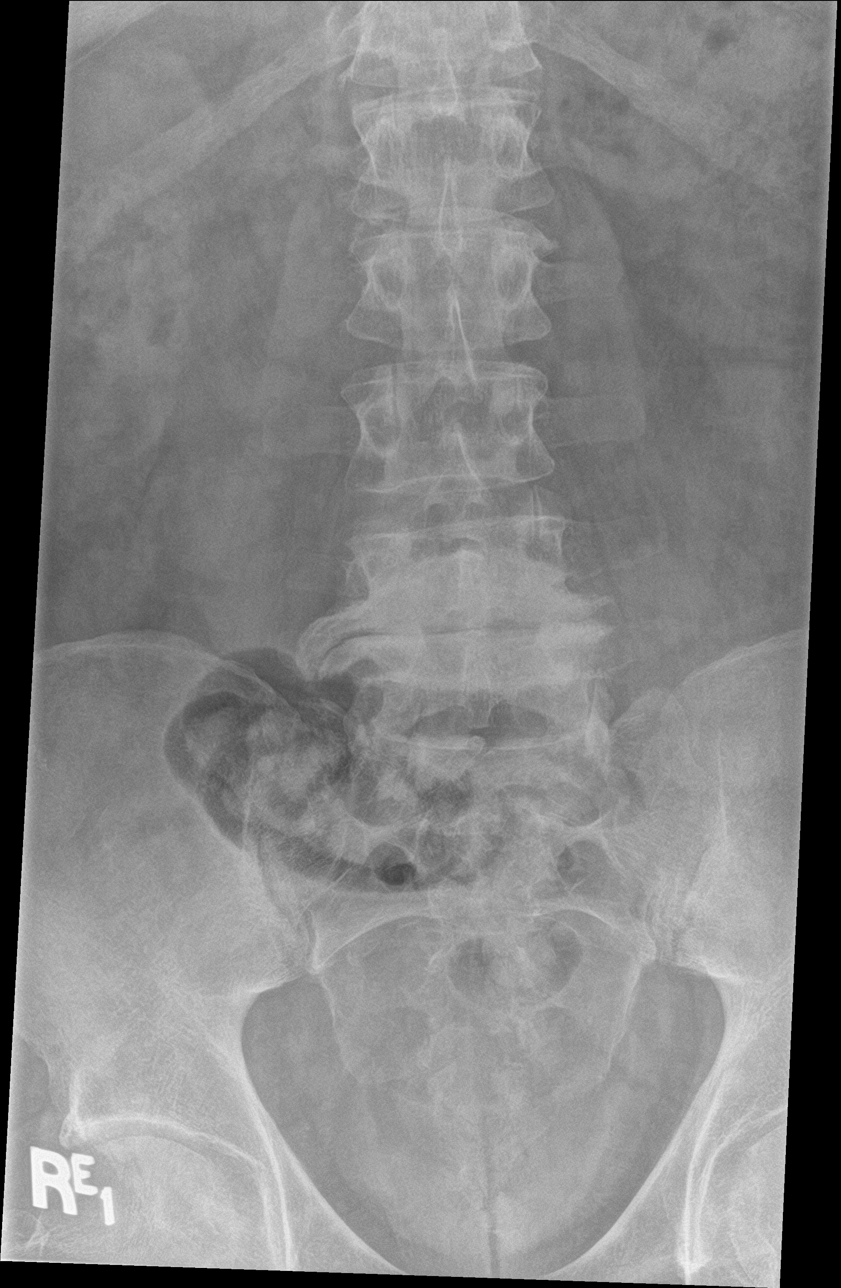

[l-spine obl (1 of 2)]
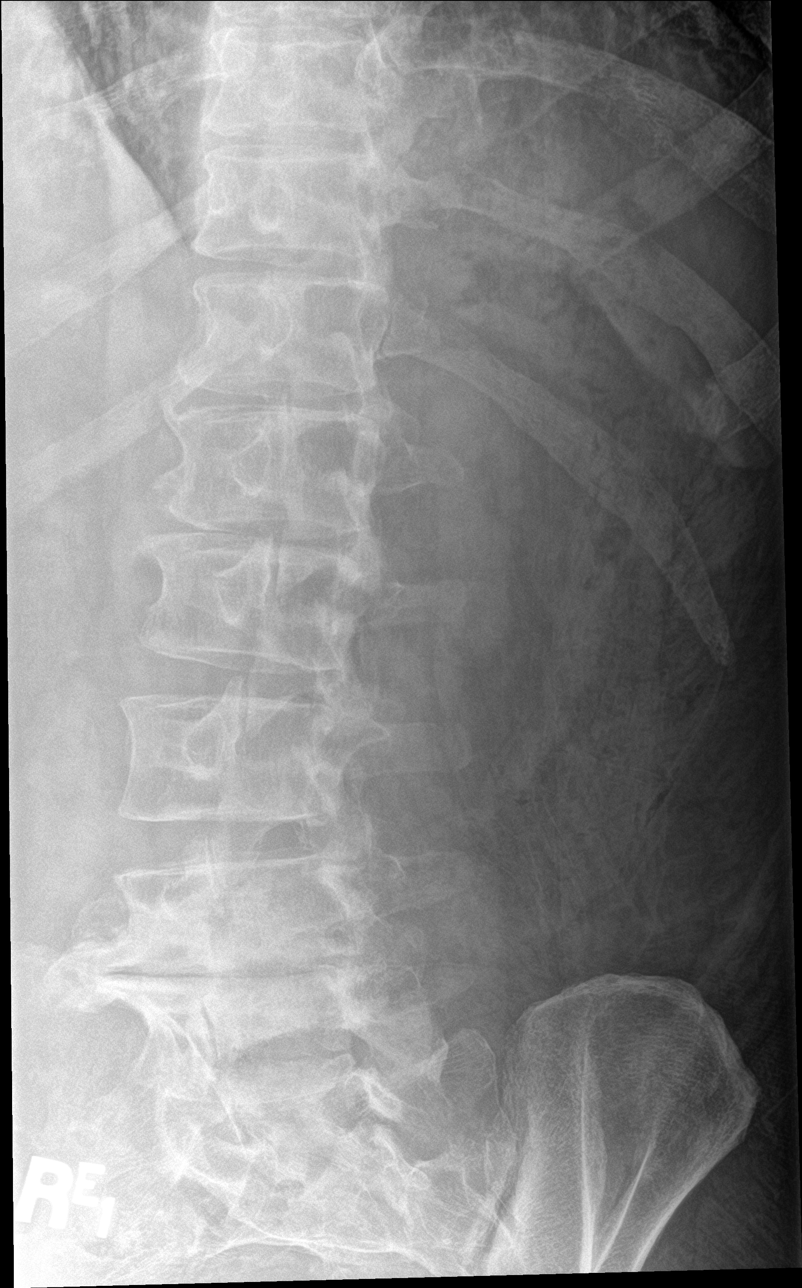

[l-spine obl (2 of 2)]
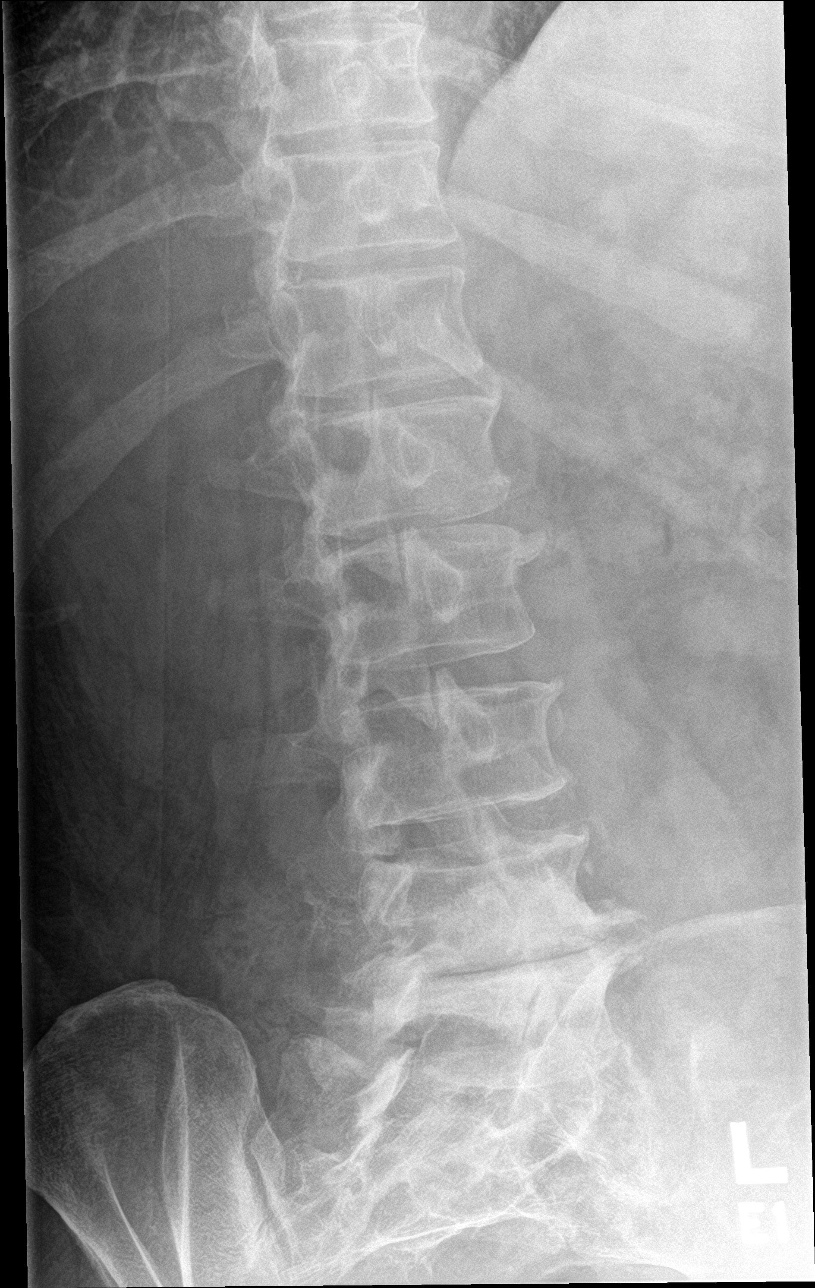

[l-spine lat]
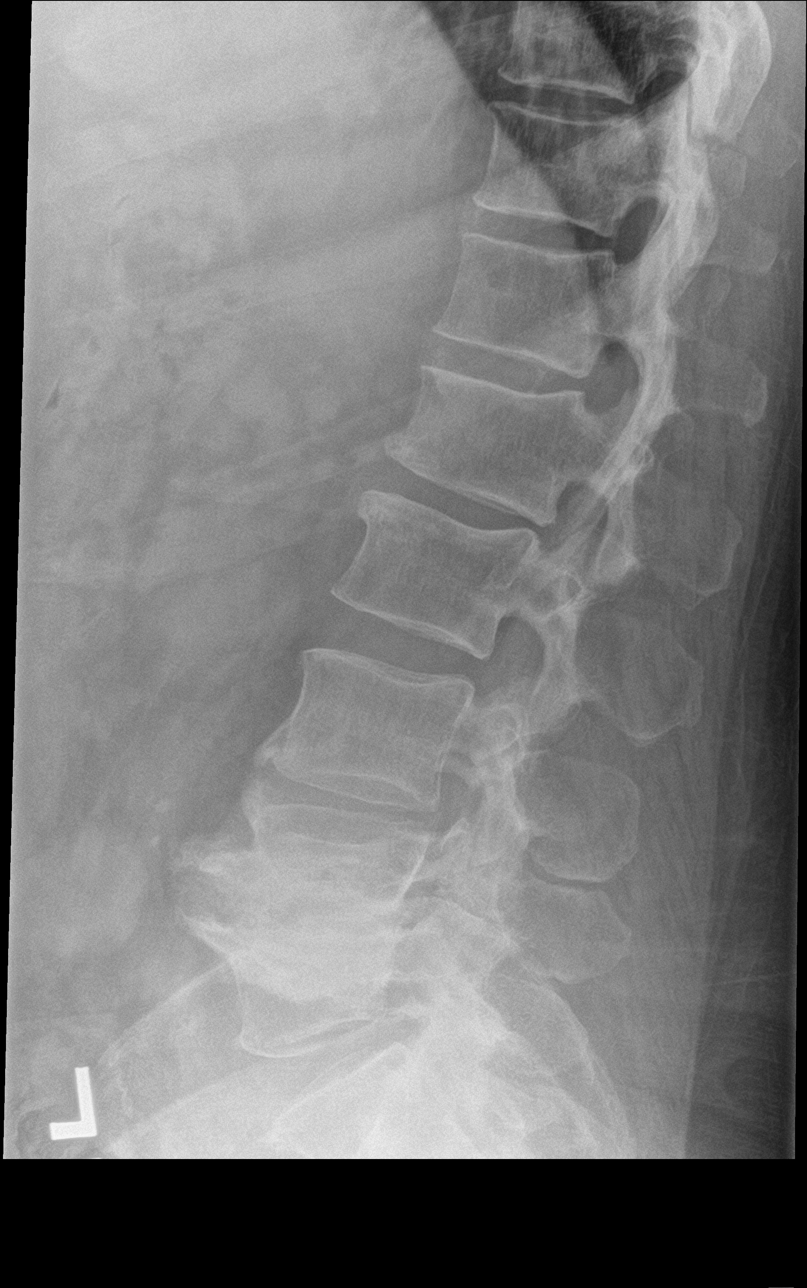

[l-spine spot]
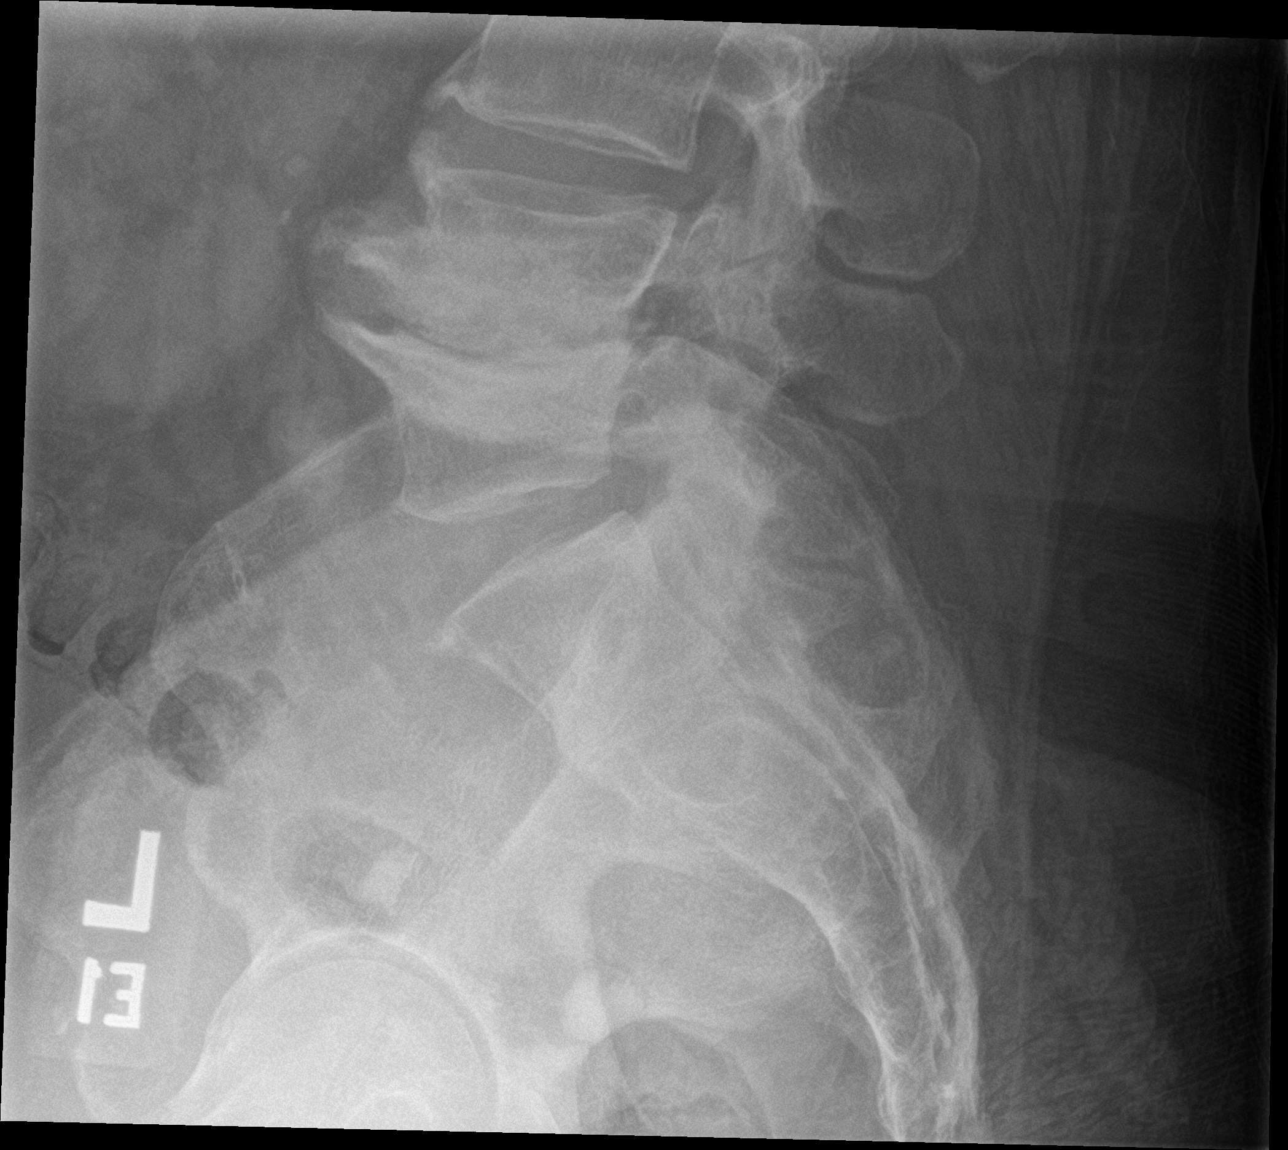

[5 of 5 positions shown; findings below may reference images not displayed]

FINDINGS: No fracture or spondylolisthesis is noted. Mild degenerative disc
disease is noted at L1-2 and L3-4. Severe degenerative disc disease
is noted at L4-5 with extensive anterior osteophyte formation.
IMPRESSION: Multilevel degenerative disc disease is noted as described above,
most pronounced at the L4-5 level. No acute abnormality is noted.

## 2021-10-25 DIAGNOSIS — D512 Transcobalamin II deficiency: Secondary | ICD-10-CM | POA: Diagnosis not present

## 2021-10-25 DIAGNOSIS — G894 Chronic pain syndrome: Secondary | ICD-10-CM | POA: Diagnosis not present

## 2021-10-25 DIAGNOSIS — M545 Low back pain, unspecified: Secondary | ICD-10-CM | POA: Diagnosis not present

## 2021-10-25 DIAGNOSIS — Z79891 Long term (current) use of opiate analgesic: Secondary | ICD-10-CM | POA: Diagnosis not present

## 2021-10-25 DIAGNOSIS — L282 Other prurigo: Secondary | ICD-10-CM | POA: Diagnosis not present

## 2021-10-25 DIAGNOSIS — E1142 Type 2 diabetes mellitus with diabetic polyneuropathy: Secondary | ICD-10-CM | POA: Diagnosis not present

## 2021-11-05 ENCOUNTER — Ambulatory Visit: Payer: Medicare Other | Admitting: Cardiology

## 2021-11-29 ENCOUNTER — Ambulatory Visit: Payer: Medicare Other | Admitting: Podiatry

## 2021-12-20 DIAGNOSIS — L282 Other prurigo: Secondary | ICD-10-CM | POA: Diagnosis not present

## 2021-12-20 DIAGNOSIS — E1142 Type 2 diabetes mellitus with diabetic polyneuropathy: Secondary | ICD-10-CM | POA: Diagnosis not present

## 2021-12-20 DIAGNOSIS — Z79891 Long term (current) use of opiate analgesic: Secondary | ICD-10-CM | POA: Diagnosis not present

## 2021-12-20 DIAGNOSIS — D512 Transcobalamin II deficiency: Secondary | ICD-10-CM | POA: Diagnosis not present

## 2021-12-20 DIAGNOSIS — M545 Low back pain, unspecified: Secondary | ICD-10-CM | POA: Diagnosis not present

## 2021-12-21 DIAGNOSIS — G4733 Obstructive sleep apnea (adult) (pediatric): Secondary | ICD-10-CM | POA: Diagnosis not present

## 2022-02-01 DIAGNOSIS — E1142 Type 2 diabetes mellitus with diabetic polyneuropathy: Secondary | ICD-10-CM | POA: Diagnosis not present

## 2022-02-01 DIAGNOSIS — M868X7 Other osteomyelitis, ankle and foot: Secondary | ICD-10-CM | POA: Diagnosis not present

## 2022-02-01 DIAGNOSIS — I10 Essential (primary) hypertension: Secondary | ICD-10-CM | POA: Diagnosis not present

## 2022-02-01 DIAGNOSIS — Z0000 Encounter for general adult medical examination without abnormal findings: Secondary | ICD-10-CM | POA: Diagnosis not present

## 2022-02-01 DIAGNOSIS — G4739 Other sleep apnea: Secondary | ICD-10-CM | POA: Diagnosis not present

## 2022-02-01 DIAGNOSIS — I2510 Atherosclerotic heart disease of native coronary artery without angina pectoris: Secondary | ICD-10-CM | POA: Diagnosis not present

## 2022-02-01 DIAGNOSIS — I700 Atherosclerosis of aorta: Secondary | ICD-10-CM | POA: Diagnosis not present

## 2022-02-01 DIAGNOSIS — I5022 Chronic systolic (congestive) heart failure: Secondary | ICD-10-CM | POA: Diagnosis not present

## 2022-03-22 DIAGNOSIS — G4733 Obstructive sleep apnea (adult) (pediatric): Secondary | ICD-10-CM | POA: Diagnosis not present

## 2022-04-01 ENCOUNTER — Other Ambulatory Visit: Payer: Self-pay | Admitting: Cardiology

## 2022-04-07 DIAGNOSIS — D512 Transcobalamin II deficiency: Secondary | ICD-10-CM | POA: Diagnosis not present

## 2022-04-07 DIAGNOSIS — E1142 Type 2 diabetes mellitus with diabetic polyneuropathy: Secondary | ICD-10-CM | POA: Diagnosis not present

## 2022-04-07 DIAGNOSIS — M545 Low back pain, unspecified: Secondary | ICD-10-CM | POA: Diagnosis not present

## 2022-04-07 DIAGNOSIS — Z79891 Long term (current) use of opiate analgesic: Secondary | ICD-10-CM | POA: Diagnosis not present

## 2022-04-13 ENCOUNTER — Ambulatory Visit: Payer: 59 | Admitting: Cardiology

## 2022-04-13 ENCOUNTER — Telehealth: Payer: Self-pay | Admitting: Cardiology

## 2022-04-13 MED ORDER — SPIRONOLACTONE 25 MG PO TABS: 12.5000 mg | ORAL_TABLET | Freq: Every day | ORAL | 0 refills | Status: DC

## 2022-04-13 NOTE — Telephone Encounter (Signed)
*  STAT* If patient is at the pharmacy, call can be transferred to refill team.   1. Which medications need to be refilled? (please list name of each medication and dose if known)   spironolactone (ALDACTONE) 25 MG tablet    2. Which pharmacy/location (including street and city if local pharmacy) is medication to be sent to? [facility] - [place], [place]   3. Do they need a 30 day or 90 day supply? 30 day   Pt had to reschedule appt for today due to transportation. New appt is scheduled for 5/17

## 2022-04-13 NOTE — Progress Notes (Deleted)
Clinical Summary [name] is a 52 y.o.male  seen today for follow up of the following medical problems.    1. Chronic systolic HF/ICM/CAD - [id number] nuclear stress: Large fixed defects anterior, lateral, inferior myocardium. NO reversibility. LVEF 17%.  - 07/2017 echo LVEF 30-35%   07/2017 cath LAD 25%, ramus intermedius 50%, LCX occluded, RCA occluded fills by collaterals.  RHC mean PA 33, PCWP 22, CI 2.1 - from cath note could consider PCI to CTO RCA and LCX if myocardium viable. Patient never followed up in clinic after cath.        04/2018 echo LVEF 30-35%, grade II diastolic dysfunction - no chest pain, no SOB/DOE - compliant with meds  06/2021 echo: LVEF 30-35%, grade II dd   2. HTN -he is compliant withmeds   3. Foot sore/Osteomyelitis - normal ABIs 08/2017 - s/p amputation of great toe, has healed.  - followed by wound care   - reports sore has healed     4. Syncope -prior episodes of syncope.  first episode about 1 week ago while at home. Got up and took a few steps, became very dizzy and fell to ground. Unsure how long he was out, he thinks short period of time. Got up and walked to bed, feeling light headed. Stood up again and walked to the door again, repeat episode. Got back in bed and slept for some time. No repeat since. Has small AC at his home, his apartment stays very hot particularly that weekend. No N/VD. Working to stay hydrated. No EtoH, no significant caffeine intake.      - admission last month for AKI while outside in the heat, had some N/V. Found to be hypotensive - multiple admits in the past for dehydrations   - no recent issues   SH: has 3 pitbulls at home Past Medical History:  Diagnosis Date   Angina pectoris with documented spasm (HCC)    CAD (coronary artery disease)    a. cath in 07/2017 showing occluded LCx and occluded RCA filled by collaterals with consideration of PCI to CTO RCA and LCx if myocardium viable --> did not  follow-up after cath   CHF (congestive heart failure) ([place])    a. EF 30-35% in 07/2017 b. similar results by repeat echo in 04/2018   Corns and callosities    Diabetes mellitus without complication (HCC)    Hyperlipidemia    Hypertension    Other sleep apnea    Pancreatitis      No Known Allergies   Current Outpatient Medications  Medication Sig Dispense Refill   ACCU-CHEK GUIDE test strip 3 (three) times daily.     Alcohol Swabs (ALCOHOL PADS) 70 % PADS SMARTSIG:Pledget(s) Topical 3 Times Daily     amLODipine (NORVASC) 5 MG tablet Take 5 mg by mouth daily.     Ascorbic Acid (VITAMIN C PO) Take by mouth 2 (two) times daily.     aspirin EC 81 MG tablet Take 1 tablet (81 mg total) by mouth daily. 90 tablet 3   buprenorphine (SUBUTEX) 2 MG SUBL SL tablet 2 mg every 8 (eight) hours.     dapagliflozin propanediol (FARXIGA) 10 MG TABS tablet Take 10 mg by mouth daily.     DULoxetine (CYMBALTA) 60 MG capsule Take 60 mg by mouth daily.      FLUoxetine (PROZAC) 40 MG capsule Take by mouth.     gabapentin (NEURONTIN) 400 MG capsule Take 400 mg by mouth  4 (four) times daily.     hydrOXYzine (ATARAX) 25 MG tablet Take 25 mg by mouth 2 (two) times daily as needed.     insulin lispro (HUMALOG) 100 UNIT/ML injection Inject 20 Units into the skin 3 (three) times daily before meals.     Insulin Pen Needle (EXEL COMFORT POINT PEN NEEDLE) 31G X 6 MM MISC USE ONCE DAILY TO INJECT INSULIN     LEVEMIR FLEXTOUCH 100 UNIT/ML Pen Inject 50 Units into the skin 2 (two) times daily. Dependant upon blood sugar level  2   metFORMIN (GLUCOPHAGE) 1000 MG tablet Take 1,000 mg by mouth 2 (two) times daily with a meal.     metoprolol succinate (TOPROL-XL) 50 MG 24 hr tablet TAKE ONE TABLET BY MOUTH DAILY. TAKE WITH OR IMMEDIATELY FOLLOWING A MEAL. 90 tablet 0   Pro Comfort Lancets 30G MISC 3 (three) times daily.     promethazine (PHENERGAN) 25 MG tablet Take 25 mg by mouth.     sacubitril-valsartan (ENTRESTO) 49-51  MG Take 1 tablet by mouth 2 (two) times daily. 60 tablet 2   simvastatin (ZOCOR) 20 MG tablet Take 20 mg by mouth daily.     spironolactone (ALDACTONE) 25 MG tablet TAKE 1/2 TABLET BY MOUTH DAILY 15 tablet 0   traMADol (ULTRAM) 50 MG tablet Take 100 mg by mouth every 6 (six) hours.     vitamin B-12 (CYANOCOBALAMIN) 1000 MCG tablet Take 1,000 mcg by mouth daily.     Vitamin D-Vitamin K (DECARA K) 1250-200 MCG CAPS Take 50,000 Units by mouth once a week.     Zinc 50 MG TABS Take 1 tablet by mouth every morning.     No current facility-administered medications for this visit.     Past Surgical History:  Procedure Laterality Date   APPENDECTOMY  2016   OTHER SURGICAL HISTORY  2016   RIGHT/LEFT HEART CATH AND CORONARY ANGIOGRAPHY N/A 08/08/2017   Procedure: RIGHT/LEFT HEART CATH AND CORONARY ANGIOGRAPHY;  Surgeon: Jettie Booze, MD;  Location: [facility];  Service: Cardiovascular;  Laterality: N/A;     No Known Allergies    Family History  Problem Relation Age of Onset   Aneurysm Father      Social History [name] reports that he quit smoking about 5 years ago. His smoking use included e-cigarettes. He has never used smokeless tobacco. [name] reports no history of alcohol use.   Review of Systems CONSTITUTIONAL: No weight loss, fever, chills, weakness or fatigue.  HEENT: Eyes: No visual loss, blurred vision, double vision or yellow sclerae.No hearing loss, sneezing, congestion, runny nose or sore throat.  SKIN: No rash or itching.  CARDIOVASCULAR:  RESPIRATORY: No shortness of breath, cough or sputum.  GASTROINTESTINAL: No anorexia, nausea, vomiting or diarrhea. No abdominal pain or blood.  GENITOURINARY: No burning on urination, no polyuria NEUROLOGICAL: No headache, dizziness, syncope, paralysis, ataxia, numbness or tingling in the extremities. No change in bowel or bladder control.  MUSCULOSKELETAL: No muscle, back pain, joint pain or  stiffness.  LYMPHATICS: No enlarged nodes. No history of splenectomy.  PSYCHIATRIC: No history of depression or anxiety.  ENDOCRINOLOGIC: No reports of sweating, cold or heat intolerance. No polyuria or polydipsia.  Marland Kitchen   Physical Examination There were no vitals filed for this visit. There were no vitals filed for this visit.  Gen: resting comfortably, no acute distress HEENT: no scleral icterus, pupils equal round and reactive, no palptable cervical adenopathy,  CV Resp: Clear  to auscultation bilaterally GI: abdomen is soft, non-tender, non-distended, normal bowel sounds, no hepatosplenomegaly MSK: extremities are warm, no edema.  Skin: warm, no rash Neuro:  no focal deficits Psych: appropriate affect   Diagnostic Studies  07/2017 echo LVEF [id number], grade I diastolic dysfunction, multiple WMAs, mild MR, mild RV dysfunction   07/2017 cath Mid RCA lesion is 100% stenosed. Vessel fills by collaterals. Prox Cx to Mid Cx lesion is 100% stenosed. Vesel fills by left to left collaterals. Ramus lesion is 50% stenosed. Mid LAD lesion is 25% stenosed. LV end diastolic pressure is moderately elevated. There is no aortic valve stenosis. Hemodynamic findings consistent with mild pulmonary hypertension. Ao sat: 96%, PA sat 63%; CO: 5.4 L.min; CI: 2.1     Recommend Aspirin 81mg  daily for moderate CAD.  Consider CTO PCI attempt if the inferior and lateral myocardium is viable, after his foot ulcer has been addressed.      08/2017 ABI Final Interpretation: Right: Resting right ankle-brachial index is within normal range. No evidence of significant right lower extremity arterial disease. The right toe-brachial index is normal.   Left: Resting left ankle-brachial index is within normal range. No evidence of significant left lower extremity arterial disease. The left toe-brachial index is normal. PPG tracings appear dampened.   04/2018 echo IMPRESSIONS      1. The left ventricle has  moderate-severely reduced systolic function, with an ejection fraction of 30-35%. The cavity size was moderately dilated. Left ventricular diastolic Doppler parameters are consistent with pseudonormalization.  2. There is akinesis of the left ventricular, basal-mid inferior wall and lateral wall.  3. The right ventricle has normal systolic function. The cavity was normal. There is no increase in right ventricular wall thickness. Right ventricular systolic pressure could not be assessed.  4. The aortic valve is tricuspid. Mild to moderate aortic annular calcification noted.  5. The mitral valve is grossly normal. There is mild mitral annular calcification present. There is calcification of the posteromedial papillary muscle and apparatus.  6. The tricuspid valve is grossly normal.  7. The aortic root is normal in size and structure.  8. The interatrial septum was not assessed.   06/2021 echo 1. Left ventricular ejection fraction, by estimation, is 30 to 35%. The  left ventricle has moderately decreased function. The left ventricle  demonstrates regional wall motion abnormalities (see scoring  diagram/findings for description). The left  ventricular internal cavity size was mildly dilated. There is mild left  ventricular hypertrophy. Left ventricular diastolic parameters are  consistent with Grade II diastolic dysfunction (pseudonormalization). The  average left ventricular global  longitudinal strain is -10.2 %. The global longitudinal strain is  abnormal.   2. Right ventricular systolic function is normal. The right ventricular  size is normal. Tricuspid regurgitation signal is inadequate for assessing  PA pressure.   3. The mitral valve is degenerative with calcified chordal apparatus.  Trivial mitral regurgitation.   4. The aortic valve is tricuspid. Aortic valve regurgitation is not  visualized. Aortic valve sclerosis is present, with no evidence of aortic  valve stenosis.   5. The  inferior vena cava is normal in size with greater than 50%  respiratory variability, suggesting right atrial pressure of 3 mmHg.     Assessment and Plan   1. Chronic systolic HF/ICM/CAD - management has been limited due to mixed compliance with meds and follow up. Titration of meds also limited by orthostastic symptoms - reports recent compliance.  - he is on toprol, entresto,  farxiga. Repeat echo, pending LVEF add aldactone and possibly titrate entresto. HR 67 today, unclear if would tolerate high toprol dosing - euvoelmic without symptoms   2. HTN - at goal, continue current meds     F/u 4 months. If echo LVEF remains decreased add aldactone 12.5mg  daily.      Arnoldo Lenis, M.D., F.A.C.C.

## 2022-05-03 DIAGNOSIS — I10 Essential (primary) hypertension: Secondary | ICD-10-CM | POA: Diagnosis not present

## 2022-05-03 DIAGNOSIS — I2510 Atherosclerotic heart disease of native coronary artery without angina pectoris: Secondary | ICD-10-CM | POA: Diagnosis not present

## 2022-05-03 DIAGNOSIS — G4739 Other sleep apnea: Secondary | ICD-10-CM | POA: Diagnosis not present

## 2022-05-03 DIAGNOSIS — I700 Atherosclerosis of aorta: Secondary | ICD-10-CM | POA: Diagnosis not present

## 2022-05-03 DIAGNOSIS — E1142 Type 2 diabetes mellitus with diabetic polyneuropathy: Secondary | ICD-10-CM | POA: Diagnosis not present

## 2022-05-03 DIAGNOSIS — M868X7 Other osteomyelitis, ankle and foot: Secondary | ICD-10-CM | POA: Diagnosis not present

## 2022-05-03 DIAGNOSIS — Z0000 Encounter for general adult medical examination without abnormal findings: Secondary | ICD-10-CM | POA: Diagnosis not present

## 2022-05-03 DIAGNOSIS — I5022 Chronic systolic (congestive) heart failure: Secondary | ICD-10-CM | POA: Diagnosis not present

## 2022-05-04 DIAGNOSIS — D512 Transcobalamin II deficiency: Secondary | ICD-10-CM | POA: Diagnosis not present

## 2022-05-04 DIAGNOSIS — M545 Low back pain, unspecified: Secondary | ICD-10-CM | POA: Diagnosis not present

## 2022-05-04 DIAGNOSIS — E1142 Type 2 diabetes mellitus with diabetic polyneuropathy: Secondary | ICD-10-CM | POA: Diagnosis not present

## 2022-05-13 DIAGNOSIS — Z122 Encounter for screening for malignant neoplasm of respiratory organs: Secondary | ICD-10-CM | POA: Diagnosis not present

## 2022-05-13 DIAGNOSIS — F17210 Nicotine dependence, cigarettes, uncomplicated: Secondary | ICD-10-CM | POA: Diagnosis not present

## 2022-06-03 ENCOUNTER — Other Ambulatory Visit (HOSPITAL_COMMUNITY)
Admission: RE | Admit: 2022-06-03 | Discharge: 2022-06-03 | Disposition: A | Discharge status: Home / Self Care | Payer: 59 | Source: Ambulatory Visit | Attending: Cardiology | Admitting: Cardiology

## 2022-06-03 ENCOUNTER — Encounter: Payer: Self-pay | Admitting: Cardiology

## 2022-06-03 ENCOUNTER — Ambulatory Visit: Payer: 59 | Attending: Cardiology | Admitting: Cardiology

## 2022-06-03 VITALS — BP 116/76 | HR 84 | Ht 72.0 in | Wt 298.0 lb

## 2022-06-03 DIAGNOSIS — E782 Mixed hyperlipidemia: Secondary | ICD-10-CM | POA: Diagnosis not present

## 2022-06-03 DIAGNOSIS — I2510 Atherosclerotic heart disease of native coronary artery without angina pectoris: Secondary | ICD-10-CM | POA: Diagnosis not present

## 2022-06-03 DIAGNOSIS — I5022 Chronic systolic (congestive) heart failure: Secondary | ICD-10-CM | POA: Diagnosis not present

## 2022-06-03 DIAGNOSIS — I10 Essential (primary) hypertension: Secondary | ICD-10-CM | POA: Insufficient documentation

## 2022-06-03 LAB — LIPID PANEL
Cholesterol: 174 mg/dL (ref 0–200)
HDL: 34 mg/dL — ABNORMAL LOW (ref >40)
LDL Cholesterol: UNDETERMINED mg/dL (ref 0–99)
Total CHOL/HDL Ratio: 5.1 RATIO
Triglycerides: 525 mg/dL — ABNORMAL HIGH (ref <150)
VLDL: UNDETERMINED mg/dL (ref 0–40)

## 2022-06-03 LAB — BASIC METABOLIC PANEL
Anion gap: 10 (ref 5–15)
BUN: 21 mg/dL — ABNORMAL HIGH (ref 6–20)
CO2: 26 mmol/L (ref 22–32)
Calcium: 8.9 mg/dL (ref 8.9–10.3)
Chloride: 100 mmol/L (ref 98–111)
Creatinine, Ser: 0.7 mg/dL (ref 0.61–1.24)
GFR, Estimated: >60 mL/min (ref >60)
Glucose, Bld: 190 mg/dL — ABNORMAL HIGH (ref 70–99)
Potassium: 4.2 mmol/L (ref 3.5–5.1)
Sodium: 136 mmol/L (ref 135–145)

## 2022-06-03 LAB — LDL CHOLESTEROL, DIRECT: Direct LDL: 74 mg/dL (ref 0–99)

## 2022-06-03 NOTE — Progress Notes (Signed)
Clinical Summary [name] is a 52 y.o.male seen today for follow up of the following medical problems.    1. Chronic systolic HF/ICM/CAD - 05/2017 nuclear stress: Large fixed defects anterior, lateral, inferior myocardium. NO reversibility. LVEF 17%.  - 07/2017 echo LVEF 30-35%   07/2017 cath LAD 25%, ramus intermedius 50%, LCX occluded fills by collaterals, RCA occluded fills by collaterals.  RHC mean PA 33, PCWP 22, CI 2.1 - from cath note could consider PCI to CTO RCA and LCX if myocardium viable. Patient never followed up in clinic after cath.  - in absence of symptoms and with collaterals have not pursued a CTO intervention       04/2018 echo LVEF 30-35%, grade II diastolic dysfunction - 06/2021 echo: LVEF 30-35%, grade II dd - no SOB/DOE, no exertional chest pains - overall compliant with meds. Reports pcp increased his aldactone to 50mg  daily.  - mild orthostatic symptoms, poor oral hydration.     2. HTN -he is compliant withmeds   3. Foot sore/Osteomyelitis - normal ABIs 08/2017 - s/p amputation of great toe, has healed.  - followed by wound care   - reports sore has healed     4. Syncope -prior episodes of syncope.  first episode about 1 week ago while at home. Got up and took a few steps, became very dizzy and fell to ground. Unsure how long he was out, he thinks short period of time. Got up and walked to bed, feeling light headed. Stood up again and walked to the door again, repeat episode. Got back in bed and slept for some time. No repeat since. Has small AC at his home, his apartment stays very hot particularly that weekend. No N/VD. Working to stay hydrated. No EtoH, no significant caffeine intake.      - admission last month for AKI while outside in the heat, had some N/V. Found to be hypotensive - multiple admits in the past for dehydrations   - no recent issues  5. Hyperlipidemia - has been on simva, does not recall being on diff med - Jan 2024  Tc 158 TG 212 HDL 33 LDL 89     SH: has 3 pitbulls at home     Past Medical History:  Diagnosis Date   Angina pectoris with documented spasm (HCC)    CAD (coronary artery disease)    a. cath in 07/2017 showing occluded LCx and occluded RCA filled by collaterals with consideration of PCI to CTO RCA and LCx if myocardium viable --> did not follow-up after cath   CHF (congestive heart failure) (HCC)    a. EF 30-35% in 07/2017 b. similar results by repeat echo in 04/2018   Corns and callosities    Diabetes mellitus without complication (HCC)    Hyperlipidemia    Hypertension    Other sleep apnea    Pancreatitis      No Known Allergies   Current Outpatient Medications  Medication Sig Dispense Refill   ACCU-CHEK GUIDE test strip 3 (three) times daily.     Alcohol Swabs (ALCOHOL PADS) 70 % PADS SMARTSIG:Pledget(s) Topical 3 Times Daily     amLODipine (NORVASC) 5 MG tablet Take 5 mg by mouth daily.     Ascorbic Acid (VITAMIN C PO) Take by mouth 2 (two) times daily.     aspirin EC 81 MG tablet Take 1 tablet (81 mg total) by mouth daily. 90 tablet 3   buprenorphine (SUBUTEX) 2 MG  SUBL SL tablet 2 mg every 8 (eight) hours.     dapagliflozin propanediol (FARXIGA) 10 MG TABS tablet Take 10 mg by mouth daily.     DULoxetine (CYMBALTA) 60 MG capsule Take 60 mg by mouth daily.      FLUoxetine (PROZAC) 40 MG capsule Take by mouth.     gabapentin (NEURONTIN) 400 MG capsule Take 400 mg by mouth 4 (four) times daily.     hydrOXYzine (ATARAX) 25 MG tablet Take 25 mg by mouth 2 (two) times daily as needed.     insulin lispro (HUMALOG) 100 UNIT/ML injection Inject 20 Units into the skin 3 (three) times daily before meals.     Insulin Pen Needle (EXEL COMFORT POINT PEN NEEDLE) 31G X 6 MM MISC USE ONCE DAILY TO INJECT INSULIN     LEVEMIR FLEXTOUCH 100 UNIT/ML Pen Inject 50 Units into the skin 2 (two) times daily. Dependant upon blood sugar level  2   metFORMIN (GLUCOPHAGE) 1000 MG tablet Take 1,000  mg by mouth 2 (two) times daily with a meal.     metoprolol succinate (TOPROL-XL) 50 MG 24 hr tablet TAKE ONE TABLET BY MOUTH DAILY. TAKE WITH OR IMMEDIATELY FOLLOWING A MEAL. 90 tablet 0   Pro Comfort Lancets 30G MISC 3 (three) times daily.     promethazine (PHENERGAN) 25 MG tablet Take 25 mg by mouth.     sacubitril-valsartan (ENTRESTO) 49-51 MG Take 1 tablet by mouth 2 (two) times daily. 60 tablet 2   simvastatin (ZOCOR) 20 MG tablet Take 20 mg by mouth daily.     spironolactone (ALDACTONE) 25 MG tablet Take 0.5 tablets (12.5 mg total) by mouth daily. 15 tablet 0   traMADol (ULTRAM) 50 MG tablet Take 100 mg by mouth every 6 (six) hours.     vitamin B-12 (CYANOCOBALAMIN) 1000 MCG tablet Take 1,000 mcg by mouth daily.     Vitamin D-Vitamin K (DECARA K) 1250-200 MCG CAPS Take 50,000 Units by mouth once a week.     Zinc 50 MG TABS Take 1 tablet by mouth every morning.     No current facility-administered medications for this visit.     Past Surgical History:  Procedure Laterality Date   APPENDECTOMY  2016   OTHER SURGICAL HISTORY  2016   RIGHT/LEFT HEART CATH AND CORONARY ANGIOGRAPHY N/A 08/08/2017   Procedure: RIGHT/LEFT HEART CATH AND CORONARY ANGIOGRAPHY;  Surgeon: [name], MD;  Location: [facility];  Service: Cardiovascular;  Laterality: N/A;     No Known Allergies    Family History  Problem Relation Age of Onset   Aneurysm Father      Social History [name] reports that he quit smoking about 5 years ago. His smoking use included e-cigarettes. He has never used smokeless tobacco. [name] reports no history of alcohol use.   Review of Systems CONSTITUTIONAL: No weight loss, fever, chills, weakness or fatigue.  HEENT: Eyes: No visual loss, blurred vision, double vision or yellow sclerae.No hearing loss, sneezing, congestion, runny nose or sore throat.  SKIN: No rash or itching.  CARDIOVASCULAR: per hpi RESPIRATORY: No shortness of  breath, cough or sputum.  GASTROINTESTINAL: No anorexia, nausea, vomiting or diarrhea. No abdominal pain or blood.  GENITOURINARY: No burning on urination, no polyuria NEUROLOGICAL: No headache, dizziness, syncope, paralysis, ataxia, numbness or tingling in the extremities. No change in bowel or bladder control.  MUSCULOSKELETAL: No muscle, back pain, joint pain or stiffness.  LYMPHATICS: No enlarged nodes.  No history of splenectomy.  PSYCHIATRIC: No history of depression or anxiety.  ENDOCRINOLOGIC: No reports of sweating, cold or heat intolerance. No polyuria or polydipsia.  Marland Kitchen   Physical Examination Today's Vitals   06/03/22 0809  BP: 116/76  Pulse: 84  SpO2: 97%  Weight: 298 lb (135.2 kg)  Height: 6' (1.829 m)   Body mass index is 40.42 kg/m.  Gen: resting comfortably, no acute distress HEENT: no scleral icterus, pupils equal round and reactive, no palptable cervical adenopathy,  CV: RRR, no mrg, no jvd Resp: Clear to auscultation bilaterally GI: abdomen is soft, non-tender, non-distended, normal bowel sounds, no hepatosplenomegaly MSK: extremities are warm, no edema.  Skin: warm, no rash Neuro:  no focal deficits Psych: appropriate affect   Diagnostic Studies  07/2017 echo LVEF 30-35%, grade I diastolic dysfunction, multiple WMAs, mild MR, mild RV dysfunction   07/2017 cath Mid RCA lesion is 100% stenosed. Vessel fills by collaterals. Prox Cx to Mid Cx lesion is 100% stenosed. Vesel fills by left to left collaterals. Ramus lesion is 50% stenosed. Mid LAD lesion is 25% stenosed. LV end diastolic pressure is moderately elevated. There is no aortic valve stenosis. Hemodynamic findings consistent with mild pulmonary hypertension. Ao sat: 96%, PA sat 63%; CO: 5.4 L.min; CI: 2.1     Recommend Aspirin 81mg  daily for moderate CAD.  Consider CTO PCI attempt if the inferior and lateral myocardium is viable, after his foot ulcer has been addressed.      08/2017 ABI Final  Interpretation: Right: Resting right ankle-brachial index is within normal range. No evidence of significant right lower extremity arterial disease. The right toe-brachial index is normal.   Left: Resting left ankle-brachial index is within normal range. No evidence of significant left lower extremity arterial disease. The left toe-brachial index is normal. PPG tracings appear dampened.   04/2018 echo IMPRESSIONS      1. The left ventricle has moderate-severely reduced systolic function, with an ejection fraction of 30-35%. The cavity size was moderately dilated. Left ventricular diastolic Doppler parameters are consistent with pseudonormalization.  2. There is akinesis of the left ventricular, basal-mid inferior wall and lateral wall.  3. The right ventricle has normal systolic function. The cavity was normal. There is no increase in right ventricular wall thickness. Right ventricular systolic pressure could not be assessed.  4. The aortic valve is tricuspid. Mild to moderate aortic annular calcification noted.  5. The mitral valve is grossly normal. There is mild mitral annular calcification present. There is calcification of the posteromedial papillary muscle and apparatus.  6. The tricuspid valve is grossly normal.  7. The aortic root is normal in size and structure.  8. The interatrial septum was not assessed.   06/2021 echo  1. Left ventricular ejection fraction, by estimation, is 30 to 35%. The  left ventricle has moderately decreased function. The left ventricle  demonstrates regional wall motion abnormalities (see scoring  diagram/findings for description). The left  ventricular internal cavity size was mildly dilated. There is mild left  ventricular hypertrophy. Left ventricular diastolic parameters are  consistent with Grade II diastolic dysfunction (pseudonormalization). The  average left ventricular global  longitudinal strain is -10.2 %. The global longitudinal strain is   abnormal.   2. Right ventricular systolic function is normal. The right ventricular  size is normal. Tricuspid regurgitation signal is inadequate for assessing  PA pressure.   3. The mitral valve is degenerative with calcified chordal apparatus.  Trivial mitral regurgitation.   4.  The aortic valve is tricuspid. Aortic valve regurgitation is not  visualized. Aortic valve sclerosis is present, with no evidence of aortic  valve stenosis.   5. The inferior vena cava is normal in size with greater than 50%  respiratory variability, suggesting right atrial pressure of 3 mmHg.    Assessment and Plan   1. Chronic systolic HF/ICM/CAD - management has been limited due to mixed compliance with meds and follow up. Titration of meds also limited by orthostastic symptoms -more recently compliance has improved  - no recent symptoms, continue current meds. Mild orthostatic symptoms at times, would not titrate - pcp recnetly increased aldactone to 50mg  daily, recheck bmet - repeat echo, pending LVEF will need ICD consideration now that he has been compliant with medical therapy.    2. HTN - he is at goal, continue current meds   3. Hyperlipidemia - repeat lipid panel, LDL goal would be at least <70   F/u 4 months     [name], M.D.

## 2022-06-03 NOTE — Patient Instructions (Signed)
Medication Instructions:  Your physician recommends that you continue on your current medications as directed. Please refer to the Current Medication list given to you today.  *If you need a refill on your cardiac medications before your next appointment, please call your pharmacy*   Lab Work: BMET FLP If you have labs (blood work) drawn today and your tests are completely normal, you will receive your results only by: MyChart Message (if you have MyChart) OR A paper copy in the mail If you have any lab test that is abnormal or we need to change your treatment, we will call you to review the results.   Testing/Procedures: Your physician has requested that you have an echocardiogram. Echocardiography is a painless test that uses sound waves to create images of your heart. It provides your doctor with information about the size and shape of your heart and how well your heart's chambers and valves are working. This procedure takes approximately one hour. There are no restrictions for this procedure. Please do NOT wear cologne, perfume, aftershave, or lotions (deodorant is allowed). Please arrive 15 minutes prior to your appointment time.    Follow-Up: At [facility] you and your health needs are our priority.  As part of our continuing mission to provide you with exceptional heart care, we have created designated Provider Care Teams.  These Care Teams include your primary Cardiologist (physician) and Advanced Practice Providers (APPs -  Physician Assistants and Nurse Practitioners) who all work together to provide you with the care you need, when you need it.  We recommend signing up for the patient portal called "MyChart".  Sign up information is provided on this After Visit Summary.  MyChart is used to connect with patients for Virtual Visits (Telemedicine).  Patients are able to view lab/test results, encounter notes, upcoming appointments, etc.  Non-urgent messages can be sent to  your provider as well.   To learn more about what you can do with MyChart, go to [email or web address].    Your next appointment:   4 month(s)  Provider:   Dina Rich, MD    Other Instructions

## 2022-06-10 ENCOUNTER — Telehealth: Payer: Self-pay | Admitting: Cardiology

## 2022-06-10 NOTE — Telephone Encounter (Signed)
Awakened about an hour ago and had blurred vision and was off balance,felt as if he was about to faint. He lives alone and has 3 pit bulls, he does not want to call 911. He was able to take BP 144/97, HR 83, finger stick 168 mg/dl.  He is going to call a neighbor to come over and stay with him. I again encouraged him to call 911. I will call him back to see what he has decided to do.    I will FYI DOD

## 2022-06-10 NOTE — Telephone Encounter (Signed)
STAT if patient feels like he/she is going to faint   Are you dizzy now?  Yes  Do you feel faint or have you passed out?   No  Do you have any other symptoms? Blurred vision, disoriented  Have you checked your HR and BP (record if available)?   No  Patient stated he has been feeling dizzy for over an hour. Patient stated he felt like he was going to faint a couple of times but has been sitting down.  Patient stated his sugar is good.  Patient also wants to get lab test results.

## 2022-06-10 NOTE — Telephone Encounter (Signed)
I spoke with patient again.He said he feels better now, no symptoms of blurred vision or fainting symptoms. He thought he was dehydrated. He says he was outside yesterday on a riding mower and did not drink any water. He reports dark urine. We talked about adequate hydration and he agrees to drink more water. I also tole him to f/u if anything changes, he agrees.

## 2022-06-21 ENCOUNTER — Telehealth: Payer: Self-pay | Admitting: Cardiology

## 2022-06-21 DIAGNOSIS — M545 Low back pain, unspecified: Secondary | ICD-10-CM | POA: Diagnosis not present

## 2022-06-21 DIAGNOSIS — E1142 Type 2 diabetes mellitus with diabetic polyneuropathy: Secondary | ICD-10-CM | POA: Diagnosis not present

## 2022-06-21 DIAGNOSIS — D512 Transcobalamin II deficiency: Secondary | ICD-10-CM | POA: Diagnosis not present

## 2022-06-21 DIAGNOSIS — Z79891 Long term (current) use of opiate analgesic: Secondary | ICD-10-CM | POA: Diagnosis not present

## 2022-06-21 DIAGNOSIS — E782 Mixed hyperlipidemia: Secondary | ICD-10-CM

## 2022-06-21 NOTE — Telephone Encounter (Signed)
Pt returning call for lab results  

## 2022-06-21 NOTE — Telephone Encounter (Signed)
Patient notified and verbalized understanding. Patient had no questions or concerns at this time. PCP copied 

## 2022-06-21 NOTE — Telephone Encounter (Signed)
-----   Message from [name], MD sent at 06/20/2022  9:41 AM EDT ----- Triglycerides are elevated, can he verify that he was fasting at time of last blood work. Needs to cut back on sweets, cookies, pastries, ice creams, sodas, fried foods. Also as his diabetes improves the TGs typically will come down. We may need to start a medication in the future if does not improve.  REpeat a lipid panel 2 months please   [name] MD

## 2022-06-27 DIAGNOSIS — G4733 Obstructive sleep apnea (adult) (pediatric): Secondary | ICD-10-CM | POA: Diagnosis not present

## 2022-07-18 ENCOUNTER — Ambulatory Visit (HOSPITAL_COMMUNITY)
Admission: RE | Admit: 2022-07-18 | Discharge: 2022-07-18 | Disposition: A | Discharge status: Home / Self Care | Payer: 59 | Source: Ambulatory Visit | Attending: Cardiology | Admitting: Cardiology

## 2022-07-18 DIAGNOSIS — I5022 Chronic systolic (congestive) heart failure: Secondary | ICD-10-CM

## 2022-07-18 LAB — ECHOCARDIOGRAM COMPLETE
AR max vel: 2.28 cm2
AV Area VTI: 2.26 cm2
AV Area mean vel: 2.47 cm2
AV Mean grad: 4.8 mmHg
AV Peak grad: 8.7 mmHg
Ao pk vel: 1.48 m/s
Area-P 1/2: 3.53 cm2
Calc EF: 25 %
Est EF: 30
S' Lateral: 6.4 cm
Single Plane A2C EF: 23.1 %
Single Plane A4C EF: 28.2 %

## 2022-07-18 MED ORDER — PERFLUTREN LIPID MICROSPHERE
1.0000 mL | INTRAVENOUS | Status: AC | PRN
  Administered 2022-07-18: 4 mL via INTRAVENOUS

## 2022-07-18 NOTE — Progress Notes (Signed)
*  PRELIMINARY RESULTS* Echocardiogram 2D Echocardiogram has been performed with [facility].  Stacey Drain 07/18/2022, 1:47 PM

## 2022-07-25 ENCOUNTER — Telehealth: Payer: Self-pay

## 2022-07-25 DIAGNOSIS — I5022 Chronic systolic (congestive) heart failure: Secondary | ICD-10-CM

## 2022-07-25 NOTE — Telephone Encounter (Signed)
Patient notified and verbalized understanding. Patient had no questions or concerns at this time. Patient agreeable with EP referral. PCP copied.

## 2022-07-25 NOTE — Telephone Encounter (Signed)
-----   Message from [name], MD sent at 07/25/2022 10:23 AM EDT ----- EF remains at 30%, needs referral to EP to discuss a possible ICD  [name] MD

## 2022-08-18 ENCOUNTER — Ambulatory Visit: Payer: 59 | Admitting: Internal Medicine

## 2022-08-18 DEATH — deceased

## 2022-10-04 ENCOUNTER — Ambulatory Visit: Payer: 59 | Admitting: Nurse Practitioner
